# Patient Record
Sex: Female | Born: 1935
Health system: Southern US, Community
[De-identification: ages and names within clinical notes are randomized; demographics above are authoritative.]

## PROBLEM LIST (undated history)

## (undated) DIAGNOSIS — H409 Unspecified glaucoma: Secondary | ICD-10-CM

## (undated) DIAGNOSIS — E785 Hyperlipidemia, unspecified: Secondary | ICD-10-CM

## (undated) DIAGNOSIS — E119 Type 2 diabetes mellitus without complications: Secondary | ICD-10-CM

## (undated) DIAGNOSIS — H269 Unspecified cataract: Secondary | ICD-10-CM

## (undated) DIAGNOSIS — K219 Gastro-esophageal reflux disease without esophagitis: Secondary | ICD-10-CM

## (undated) DIAGNOSIS — M5116 Intervertebral disc disorders with radiculopathy, lumbar region: Secondary | ICD-10-CM

## (undated) DIAGNOSIS — M7541 Impingement syndrome of right shoulder: Secondary | ICD-10-CM

## (undated) DIAGNOSIS — I10 Essential (primary) hypertension: Secondary | ICD-10-CM

## (undated) DIAGNOSIS — T7840XA Allergy, unspecified, initial encounter: Secondary | ICD-10-CM

## (undated) DIAGNOSIS — I480 Paroxysmal atrial fibrillation: Secondary | ICD-10-CM

## (undated) DIAGNOSIS — S22080S Wedge compression fracture of T11-T12 vertebra, sequela: Secondary | ICD-10-CM

## (undated) DIAGNOSIS — K649 Unspecified hemorrhoids: Secondary | ICD-10-CM

## (undated) DIAGNOSIS — M75100 Unspecified rotator cuff tear or rupture of unspecified shoulder, not specified as traumatic: Secondary | ICD-10-CM

## (undated) DIAGNOSIS — R011 Cardiac murmur, unspecified: Secondary | ICD-10-CM

## (undated) DIAGNOSIS — H9313 Tinnitus, bilateral: Secondary | ICD-10-CM

## (undated) DIAGNOSIS — R809 Proteinuria, unspecified: Secondary | ICD-10-CM

## (undated) DIAGNOSIS — E039 Hypothyroidism, unspecified: Secondary | ICD-10-CM

## (undated) DIAGNOSIS — C7B00 Secondary carcinoid tumors, unspecified site: Secondary | ICD-10-CM

## (undated) DIAGNOSIS — G4700 Insomnia, unspecified: Secondary | ICD-10-CM

## (undated) DIAGNOSIS — E669 Obesity, unspecified: Secondary | ICD-10-CM

## (undated) DIAGNOSIS — R911 Solitary pulmonary nodule: Secondary | ICD-10-CM

## (undated) DIAGNOSIS — M810 Age-related osteoporosis without current pathological fracture: Secondary | ICD-10-CM

## (undated) DIAGNOSIS — D179 Benign lipomatous neoplasm, unspecified: Secondary | ICD-10-CM

## (undated) DIAGNOSIS — M21619 Bunion of unspecified foot: Secondary | ICD-10-CM

## (undated) DIAGNOSIS — G5600 Carpal tunnel syndrome, unspecified upper limb: Secondary | ICD-10-CM

## (undated) DIAGNOSIS — M62830 Muscle spasm of back: Secondary | ICD-10-CM

## (undated) DIAGNOSIS — M159 Polyosteoarthritis, unspecified: Secondary | ICD-10-CM

## (undated) DIAGNOSIS — G459 Transient cerebral ischemic attack, unspecified: Secondary | ICD-10-CM

## (undated) DIAGNOSIS — E559 Vitamin D deficiency, unspecified: Secondary | ICD-10-CM

## (undated) DIAGNOSIS — M109 Gout, unspecified: Secondary | ICD-10-CM

## (undated) HISTORY — DX: Solitary pulmonary nodule: R91.1

## (undated) HISTORY — PX: HEMORROIDECTOMY: SUR656

## (undated) HISTORY — DX: Allergy, unspecified, initial encounter: T78.40XA

## (undated) HISTORY — PX: FEMUR FRACTURE SURGERY: SHX633

## (undated) HISTORY — DX: Gout, unspecified: M10.9

## (undated) HISTORY — DX: Gastro-esophageal reflux disease without esophagitis: K21.9

## (undated) HISTORY — PX: CARPAL TUNNEL RELEASE: SHX101

## (undated) HISTORY — PX: MULTIPLE TOOTH EXTRACTIONS: SHX2053

## (undated) HISTORY — DX: Unspecified glaucoma: H40.9

## (undated) HISTORY — DX: Bunion of unspecified foot: M21.619

## (undated) HISTORY — DX: Hyperlipidemia, unspecified: E78.5

## (undated) HISTORY — PX: COLON SURGERY: SHX602

## (undated) HISTORY — DX: Intervertebral disc disorders with radiculopathy, lumbar region: M51.16

## (undated) HISTORY — DX: Unspecified rotator cuff tear or rupture of unspecified shoulder, not specified as traumatic: M75.100

## (undated) HISTORY — DX: Paroxysmal atrial fibrillation: I48.0

## (undated) HISTORY — DX: Cardiac murmur, unspecified: R01.1

## (undated) HISTORY — DX: Vitamin D deficiency, unspecified: E55.9

## (undated) HISTORY — PX: OTHER SURGICAL HISTORY: SHX169

## (undated) HISTORY — DX: Unspecified hemorrhoids: K64.9

## (undated) HISTORY — DX: Impingement syndrome of right shoulder: M75.41

## (undated) HISTORY — DX: Proteinuria, unspecified: R80.9

## (undated) HISTORY — DX: Unspecified cataract: H26.9

## (undated) HISTORY — DX: Age-related osteoporosis without current pathological fracture: M81.0

## (undated) HISTORY — DX: Insomnia, unspecified: G47.00

## (undated) HISTORY — DX: Benign lipomatous neoplasm, unspecified: D17.9

## (undated) HISTORY — DX: Polyosteoarthritis, unspecified: M15.9

## (undated) HISTORY — DX: Carpal tunnel syndrome, unspecified upper limb: G56.00

## (undated) HISTORY — DX: Obesity, unspecified: E66.9

## (undated) HISTORY — PX: APPENDECTOMY: SHX54

## (undated) HISTORY — DX: Type 2 diabetes mellitus without complications: E11.9

## (undated) HISTORY — DX: Wedge compression fracture of t11-T12 vertebra, sequela: S22.080S

## (undated) HISTORY — DX: Essential (primary) hypertension: I10

## (undated) HISTORY — DX: Tinnitus, bilateral: H93.13

---
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
## 1981-11-04 HISTORY — PX: BREAST SURGERY: SHX581

## 1984-11-04 HISTORY — PX: ABDOMINAL HYSTERECTOMY: SHX81

## 1996-11-04 DIAGNOSIS — G459 Transient cerebral ischemic attack, unspecified: Secondary | ICD-10-CM

## 1996-11-04 HISTORY — DX: Transient cerebral ischemic attack, unspecified: G45.9

## 1999-11-05 HISTORY — PX: FRACTURE SURGERY: SHX138

## 2004-10-24 ENCOUNTER — Ambulatory Visit: Payer: Self-pay

## 2005-06-12 ENCOUNTER — Ambulatory Visit: Payer: Self-pay

## 2006-05-08 ENCOUNTER — Inpatient Hospital Stay: Payer: Self-pay | Admitting: Internal Medicine

## 2006-05-08 ENCOUNTER — Other Ambulatory Visit: Payer: Self-pay

## 2006-06-16 ENCOUNTER — Ambulatory Visit: Payer: Self-pay | Admitting: Family Medicine

## 2006-07-31 ENCOUNTER — Ambulatory Visit: Payer: Self-pay

## 2006-08-06 ENCOUNTER — Ambulatory Visit: Payer: Self-pay

## 2006-08-20 ENCOUNTER — Ambulatory Visit: Payer: Self-pay | Admitting: General Surgery

## 2006-11-04 LAB — HM COLONOSCOPY

## 2007-06-05 ENCOUNTER — Ambulatory Visit: Payer: Self-pay | Admitting: Oncology

## 2007-06-29 ENCOUNTER — Ambulatory Visit: Payer: Self-pay | Admitting: Oncology

## 2007-07-06 ENCOUNTER — Ambulatory Visit: Payer: Self-pay | Admitting: Oncology

## 2007-07-13 ENCOUNTER — Ambulatory Visit: Payer: Self-pay | Admitting: Oncology

## 2007-08-05 ENCOUNTER — Ambulatory Visit: Payer: Self-pay | Admitting: Oncology

## 2007-08-06 ENCOUNTER — Ambulatory Visit: Payer: Self-pay | Admitting: Oncology

## 2007-08-26 ENCOUNTER — Ambulatory Visit: Payer: Self-pay | Admitting: Unknown Physician Specialty

## 2007-09-05 ENCOUNTER — Ambulatory Visit: Payer: Self-pay | Admitting: Oncology

## 2007-10-05 ENCOUNTER — Ambulatory Visit: Payer: Self-pay | Admitting: Oncology

## 2007-11-05 ENCOUNTER — Ambulatory Visit: Payer: Self-pay | Admitting: Oncology

## 2007-11-26 ENCOUNTER — Ambulatory Visit: Payer: Self-pay | Admitting: Family Medicine

## 2007-12-06 ENCOUNTER — Ambulatory Visit: Payer: Self-pay | Admitting: Oncology

## 2008-01-03 ENCOUNTER — Ambulatory Visit: Payer: Self-pay | Admitting: Oncology

## 2008-05-11 ENCOUNTER — Ambulatory Visit: Payer: Self-pay | Admitting: Family Medicine

## 2008-07-06 ENCOUNTER — Ambulatory Visit: Payer: Self-pay | Admitting: Family Medicine

## 2008-07-18 ENCOUNTER — Ambulatory Visit: Payer: Self-pay | Admitting: Family Medicine

## 2009-01-11 ENCOUNTER — Ambulatory Visit: Payer: Self-pay | Admitting: Family Medicine

## 2009-01-20 ENCOUNTER — Ambulatory Visit: Payer: Self-pay | Admitting: Internal Medicine

## 2010-01-23 ENCOUNTER — Ambulatory Visit: Payer: Self-pay | Admitting: Family Medicine

## 2011-03-13 ENCOUNTER — Ambulatory Visit: Payer: Self-pay | Admitting: Family Medicine

## 2011-03-13 LAB — HM MAMMOGRAPHY: HM MAMMO: NORMAL

## 2011-12-10 DIAGNOSIS — H4010X0 Unspecified open-angle glaucoma, stage unspecified: Secondary | ICD-10-CM | POA: Diagnosis not present

## 2012-01-01 DIAGNOSIS — I10 Essential (primary) hypertension: Secondary | ICD-10-CM | POA: Diagnosis not present

## 2012-01-01 DIAGNOSIS — E785 Hyperlipidemia, unspecified: Secondary | ICD-10-CM | POA: Diagnosis not present

## 2012-01-01 DIAGNOSIS — D649 Anemia, unspecified: Secondary | ICD-10-CM | POA: Diagnosis not present

## 2012-02-03 DIAGNOSIS — R0609 Other forms of dyspnea: Secondary | ICD-10-CM | POA: Diagnosis not present

## 2012-02-03 DIAGNOSIS — R0989 Other specified symptoms and signs involving the circulatory and respiratory systems: Secondary | ICD-10-CM | POA: Diagnosis not present

## 2012-02-03 DIAGNOSIS — I10 Essential (primary) hypertension: Secondary | ICD-10-CM | POA: Diagnosis not present

## 2012-03-13 DIAGNOSIS — E785 Hyperlipidemia, unspecified: Secondary | ICD-10-CM | POA: Diagnosis not present

## 2012-03-13 DIAGNOSIS — I10 Essential (primary) hypertension: Secondary | ICD-10-CM | POA: Diagnosis not present

## 2012-03-13 DIAGNOSIS — D509 Iron deficiency anemia, unspecified: Secondary | ICD-10-CM | POA: Diagnosis not present

## 2012-03-13 DIAGNOSIS — E1129 Type 2 diabetes mellitus with other diabetic kidney complication: Secondary | ICD-10-CM | POA: Diagnosis not present

## 2012-06-16 DIAGNOSIS — H4010X0 Unspecified open-angle glaucoma, stage unspecified: Secondary | ICD-10-CM | POA: Diagnosis not present

## 2012-07-14 ENCOUNTER — Ambulatory Visit: Payer: Self-pay | Admitting: Family Medicine

## 2012-07-14 DIAGNOSIS — E785 Hyperlipidemia, unspecified: Secondary | ICD-10-CM | POA: Diagnosis not present

## 2012-07-14 DIAGNOSIS — E1129 Type 2 diabetes mellitus with other diabetic kidney complication: Secondary | ICD-10-CM | POA: Diagnosis not present

## 2012-07-14 DIAGNOSIS — G4700 Insomnia, unspecified: Secondary | ICD-10-CM | POA: Diagnosis not present

## 2012-07-14 DIAGNOSIS — I10 Essential (primary) hypertension: Secondary | ICD-10-CM | POA: Diagnosis not present

## 2012-07-14 DIAGNOSIS — M549 Dorsalgia, unspecified: Secondary | ICD-10-CM | POA: Diagnosis not present

## 2012-07-14 DIAGNOSIS — D509 Iron deficiency anemia, unspecified: Secondary | ICD-10-CM | POA: Diagnosis not present

## 2012-07-14 DIAGNOSIS — Z23 Encounter for immunization: Secondary | ICD-10-CM | POA: Diagnosis not present

## 2012-07-23 DIAGNOSIS — M1710 Unilateral primary osteoarthritis, unspecified knee: Secondary | ICD-10-CM | POA: Diagnosis not present

## 2012-07-23 DIAGNOSIS — M67919 Unspecified disorder of synovium and tendon, unspecified shoulder: Secondary | ICD-10-CM | POA: Diagnosis not present

## 2012-07-23 DIAGNOSIS — M719 Bursopathy, unspecified: Secondary | ICD-10-CM | POA: Diagnosis not present

## 2012-08-10 DIAGNOSIS — I10 Essential (primary) hypertension: Secondary | ICD-10-CM | POA: Diagnosis not present

## 2012-08-10 DIAGNOSIS — R0609 Other forms of dyspnea: Secondary | ICD-10-CM | POA: Diagnosis not present

## 2012-11-26 DIAGNOSIS — E785 Hyperlipidemia, unspecified: Secondary | ICD-10-CM | POA: Diagnosis not present

## 2012-11-26 DIAGNOSIS — Z23 Encounter for immunization: Secondary | ICD-10-CM | POA: Diagnosis not present

## 2012-11-26 DIAGNOSIS — E1129 Type 2 diabetes mellitus with other diabetic kidney complication: Secondary | ICD-10-CM | POA: Diagnosis not present

## 2012-11-26 DIAGNOSIS — M549 Dorsalgia, unspecified: Secondary | ICD-10-CM | POA: Diagnosis not present

## 2012-11-26 DIAGNOSIS — I10 Essential (primary) hypertension: Secondary | ICD-10-CM | POA: Diagnosis not present

## 2012-12-22 DIAGNOSIS — H4010X0 Unspecified open-angle glaucoma, stage unspecified: Secondary | ICD-10-CM | POA: Diagnosis not present

## 2013-02-08 DIAGNOSIS — I200 Unstable angina: Secondary | ICD-10-CM | POA: Diagnosis not present

## 2013-02-08 DIAGNOSIS — I059 Rheumatic mitral valve disease, unspecified: Secondary | ICD-10-CM | POA: Diagnosis not present

## 2013-02-08 DIAGNOSIS — R0609 Other forms of dyspnea: Secondary | ICD-10-CM | POA: Diagnosis not present

## 2013-03-04 DIAGNOSIS — R0609 Other forms of dyspnea: Secondary | ICD-10-CM | POA: Diagnosis not present

## 2013-03-04 DIAGNOSIS — I209 Angina pectoris, unspecified: Secondary | ICD-10-CM | POA: Diagnosis not present

## 2013-03-04 DIAGNOSIS — R0989 Other specified symptoms and signs involving the circulatory and respiratory systems: Secondary | ICD-10-CM | POA: Diagnosis not present

## 2013-05-24 DIAGNOSIS — G4700 Insomnia, unspecified: Secondary | ICD-10-CM | POA: Diagnosis not present

## 2013-05-24 DIAGNOSIS — E1129 Type 2 diabetes mellitus with other diabetic kidney complication: Secondary | ICD-10-CM | POA: Diagnosis not present

## 2013-05-24 DIAGNOSIS — M542 Cervicalgia: Secondary | ICD-10-CM | POA: Diagnosis not present

## 2013-05-24 DIAGNOSIS — I10 Essential (primary) hypertension: Secondary | ICD-10-CM | POA: Diagnosis not present

## 2013-06-03 ENCOUNTER — Ambulatory Visit: Payer: Self-pay | Admitting: Family Medicine

## 2013-06-03 DIAGNOSIS — M542 Cervicalgia: Secondary | ICD-10-CM | POA: Diagnosis not present

## 2013-06-03 DIAGNOSIS — IMO0002 Reserved for concepts with insufficient information to code with codable children: Secondary | ICD-10-CM | POA: Diagnosis not present

## 2013-06-09 DIAGNOSIS — H4010X0 Unspecified open-angle glaucoma, stage unspecified: Secondary | ICD-10-CM | POA: Diagnosis not present

## 2013-06-28 DIAGNOSIS — M6281 Muscle weakness (generalized): Secondary | ICD-10-CM | POA: Diagnosis not present

## 2013-06-28 DIAGNOSIS — M5030 Other cervical disc degeneration, unspecified cervical region: Secondary | ICD-10-CM | POA: Diagnosis not present

## 2013-07-22 DIAGNOSIS — E785 Hyperlipidemia, unspecified: Secondary | ICD-10-CM | POA: Diagnosis not present

## 2013-07-22 DIAGNOSIS — I10 Essential (primary) hypertension: Secondary | ICD-10-CM | POA: Diagnosis not present

## 2013-07-22 DIAGNOSIS — D509 Iron deficiency anemia, unspecified: Secondary | ICD-10-CM | POA: Diagnosis not present

## 2013-08-09 DIAGNOSIS — I10 Essential (primary) hypertension: Secondary | ICD-10-CM | POA: Diagnosis not present

## 2013-08-09 DIAGNOSIS — E783 Hyperchylomicronemia: Secondary | ICD-10-CM | POA: Diagnosis not present

## 2013-08-12 DIAGNOSIS — M62838 Other muscle spasm: Secondary | ICD-10-CM | POA: Diagnosis not present

## 2013-08-12 DIAGNOSIS — IMO0002 Reserved for concepts with insufficient information to code with codable children: Secondary | ICD-10-CM | POA: Diagnosis not present

## 2013-09-07 DIAGNOSIS — IMO0002 Reserved for concepts with insufficient information to code with codable children: Secondary | ICD-10-CM | POA: Diagnosis not present

## 2013-09-07 DIAGNOSIS — M1710 Unilateral primary osteoarthritis, unspecified knee: Secondary | ICD-10-CM | POA: Diagnosis not present

## 2013-09-07 DIAGNOSIS — M47817 Spondylosis without myelopathy or radiculopathy, lumbosacral region: Secondary | ICD-10-CM | POA: Diagnosis not present

## 2013-09-07 DIAGNOSIS — M542 Cervicalgia: Secondary | ICD-10-CM | POA: Diagnosis not present

## 2013-09-07 DIAGNOSIS — M25569 Pain in unspecified knee: Secondary | ICD-10-CM | POA: Diagnosis not present

## 2013-09-07 DIAGNOSIS — M5030 Other cervical disc degeneration, unspecified cervical region: Secondary | ICD-10-CM | POA: Diagnosis not present

## 2013-09-16 ENCOUNTER — Ambulatory Visit: Payer: Self-pay | Admitting: Physical Medicine and Rehabilitation

## 2013-09-16 DIAGNOSIS — M5126 Other intervertebral disc displacement, lumbar region: Secondary | ICD-10-CM | POA: Diagnosis not present

## 2013-09-16 DIAGNOSIS — M549 Dorsalgia, unspecified: Secondary | ICD-10-CM | POA: Diagnosis not present

## 2013-09-16 DIAGNOSIS — M79609 Pain in unspecified limb: Secondary | ICD-10-CM | POA: Diagnosis not present

## 2013-09-16 DIAGNOSIS — M5137 Other intervertebral disc degeneration, lumbosacral region: Secondary | ICD-10-CM | POA: Diagnosis not present

## 2013-09-16 DIAGNOSIS — M47817 Spondylosis without myelopathy or radiculopathy, lumbosacral region: Secondary | ICD-10-CM | POA: Diagnosis not present

## 2013-09-23 DIAGNOSIS — M25569 Pain in unspecified knee: Secondary | ICD-10-CM | POA: Diagnosis not present

## 2013-09-23 DIAGNOSIS — M542 Cervicalgia: Secondary | ICD-10-CM | POA: Diagnosis not present

## 2013-09-23 DIAGNOSIS — M1710 Unilateral primary osteoarthritis, unspecified knee: Secondary | ICD-10-CM | POA: Diagnosis not present

## 2013-09-23 DIAGNOSIS — IMO0002 Reserved for concepts with insufficient information to code with codable children: Secondary | ICD-10-CM | POA: Diagnosis not present

## 2013-09-23 DIAGNOSIS — M47817 Spondylosis without myelopathy or radiculopathy, lumbosacral region: Secondary | ICD-10-CM | POA: Diagnosis not present

## 2013-09-23 DIAGNOSIS — M5030 Other cervical disc degeneration, unspecified cervical region: Secondary | ICD-10-CM | POA: Diagnosis not present

## 2013-09-24 DIAGNOSIS — D509 Iron deficiency anemia, unspecified: Secondary | ICD-10-CM | POA: Diagnosis not present

## 2013-09-24 DIAGNOSIS — Z23 Encounter for immunization: Secondary | ICD-10-CM | POA: Diagnosis not present

## 2013-09-24 DIAGNOSIS — I10 Essential (primary) hypertension: Secondary | ICD-10-CM | POA: Diagnosis not present

## 2013-09-24 DIAGNOSIS — E1129 Type 2 diabetes mellitus with other diabetic kidney complication: Secondary | ICD-10-CM | POA: Diagnosis not present

## 2013-12-08 DIAGNOSIS — H4010X0 Unspecified open-angle glaucoma, stage unspecified: Secondary | ICD-10-CM | POA: Diagnosis not present

## 2014-01-24 DIAGNOSIS — E785 Hyperlipidemia, unspecified: Secondary | ICD-10-CM | POA: Diagnosis not present

## 2014-01-24 DIAGNOSIS — J069 Acute upper respiratory infection, unspecified: Secondary | ICD-10-CM | POA: Diagnosis not present

## 2014-01-24 DIAGNOSIS — I10 Essential (primary) hypertension: Secondary | ICD-10-CM | POA: Diagnosis not present

## 2014-01-24 DIAGNOSIS — E1129 Type 2 diabetes mellitus with other diabetic kidney complication: Secondary | ICD-10-CM | POA: Diagnosis not present

## 2014-03-28 ENCOUNTER — Emergency Department: Payer: Self-pay | Admitting: Emergency Medicine

## 2014-03-28 DIAGNOSIS — I10 Essential (primary) hypertension: Secondary | ICD-10-CM | POA: Diagnosis not present

## 2014-03-28 DIAGNOSIS — R42 Dizziness and giddiness: Secondary | ICD-10-CM | POA: Diagnosis not present

## 2014-03-28 DIAGNOSIS — E119 Type 2 diabetes mellitus without complications: Secondary | ICD-10-CM | POA: Diagnosis not present

## 2014-03-28 DIAGNOSIS — S298XXA Other specified injuries of thorax, initial encounter: Secondary | ICD-10-CM | POA: Diagnosis not present

## 2014-03-28 LAB — CBC WITH DIFFERENTIAL/PLATELET
BASOS ABS: 0.1 10*3/uL (ref 0.0–0.1)
Basophil %: 1 %
Eosinophil #: 0.3 10*3/uL (ref 0.0–0.7)
Eosinophil %: 2.2 %
HCT: 36.7 % (ref 35.0–47.0)
HGB: 11.3 g/dL — ABNORMAL LOW (ref 12.0–16.0)
LYMPHS ABS: 4.3 10*3/uL — AB (ref 1.0–3.6)
Lymphocyte %: 32.1 %
MCH: 24.8 pg — AB (ref 26.0–34.0)
MCHC: 30.7 g/dL — ABNORMAL LOW (ref 32.0–36.0)
MCV: 81 fL (ref 80–100)
MONOS PCT: 7.1 %
Monocyte #: 0.9 x10 3/mm (ref 0.2–0.9)
Neutrophil #: 7.7 10*3/uL — ABNORMAL HIGH (ref 1.4–6.5)
Neutrophil %: 57.6 %
Platelet: 343 10*3/uL (ref 150–440)
RBC: 4.55 10*6/uL (ref 3.80–5.20)
RDW: 17 % — ABNORMAL HIGH (ref 11.5–14.5)
WBC: 13.4 10*3/uL — ABNORMAL HIGH (ref 3.6–11.0)

## 2014-03-28 LAB — BASIC METABOLIC PANEL
Anion Gap: 8 (ref 7–16)
BUN: 13 mg/dL (ref 7–18)
CALCIUM: 8.9 mg/dL (ref 8.5–10.1)
CO2: 26 mmol/L (ref 21–32)
CREATININE: 1.26 mg/dL (ref 0.60–1.30)
Chloride: 102 mmol/L (ref 98–107)
EGFR (African American): 47 — ABNORMAL LOW
GFR CALC NON AF AMER: 40 — AB
GLUCOSE: 232 mg/dL — AB (ref 65–99)
Osmolality: 279 (ref 275–301)
Potassium: 3.6 mmol/L (ref 3.5–5.1)
Sodium: 136 mmol/L (ref 136–145)

## 2014-03-28 LAB — URINALYSIS, COMPLETE
BLOOD: NEGATIVE
Bacteria: NONE SEEN
Bilirubin,UR: NEGATIVE
Glucose,UR: NEGATIVE mg/dL (ref 0–75)
Hyaline Cast: 5
KETONE: NEGATIVE
Leukocyte Esterase: NEGATIVE
NITRITE: NEGATIVE
Ph: 5 (ref 4.5–8.0)
Protein: NEGATIVE
RBC, UR: NONE SEEN /HPF (ref 0–5)
SPECIFIC GRAVITY: 1.019 (ref 1.003–1.030)
Squamous Epithelial: 8
WBC UR: NONE SEEN /HPF (ref 0–5)

## 2014-03-28 LAB — TROPONIN I

## 2014-04-05 DIAGNOSIS — J309 Allergic rhinitis, unspecified: Secondary | ICD-10-CM | POA: Diagnosis not present

## 2014-04-05 DIAGNOSIS — R42 Dizziness and giddiness: Secondary | ICD-10-CM | POA: Diagnosis not present

## 2014-04-05 DIAGNOSIS — J189 Pneumonia, unspecified organism: Secondary | ICD-10-CM | POA: Diagnosis not present

## 2014-04-18 DIAGNOSIS — R42 Dizziness and giddiness: Secondary | ICD-10-CM | POA: Diagnosis not present

## 2014-05-02 DIAGNOSIS — E785 Hyperlipidemia, unspecified: Secondary | ICD-10-CM | POA: Diagnosis not present

## 2014-05-02 DIAGNOSIS — E1129 Type 2 diabetes mellitus with other diabetic kidney complication: Secondary | ICD-10-CM | POA: Diagnosis not present

## 2014-05-02 DIAGNOSIS — R42 Dizziness and giddiness: Secondary | ICD-10-CM | POA: Diagnosis not present

## 2014-05-02 DIAGNOSIS — D72829 Elevated white blood cell count, unspecified: Secondary | ICD-10-CM | POA: Diagnosis not present

## 2014-05-02 DIAGNOSIS — N189 Chronic kidney disease, unspecified: Secondary | ICD-10-CM | POA: Diagnosis not present

## 2014-05-05 DIAGNOSIS — R0982 Postnasal drip: Secondary | ICD-10-CM | POA: Diagnosis not present

## 2014-05-05 DIAGNOSIS — H814 Vertigo of central origin: Secondary | ICD-10-CM | POA: Diagnosis not present

## 2014-05-05 DIAGNOSIS — R42 Dizziness and giddiness: Secondary | ICD-10-CM | POA: Diagnosis not present

## 2014-05-05 DIAGNOSIS — R05 Cough: Secondary | ICD-10-CM | POA: Diagnosis not present

## 2014-05-05 DIAGNOSIS — R059 Cough, unspecified: Secondary | ICD-10-CM | POA: Diagnosis not present

## 2014-05-11 DIAGNOSIS — R05 Cough: Secondary | ICD-10-CM | POA: Diagnosis not present

## 2014-05-11 DIAGNOSIS — R059 Cough, unspecified: Secondary | ICD-10-CM | POA: Diagnosis not present

## 2014-05-11 DIAGNOSIS — R053 Chronic cough: Secondary | ICD-10-CM | POA: Insufficient documentation

## 2014-05-11 DIAGNOSIS — D72829 Elevated white blood cell count, unspecified: Secondary | ICD-10-CM | POA: Diagnosis not present

## 2014-05-12 ENCOUNTER — Ambulatory Visit: Payer: Self-pay

## 2014-05-12 DIAGNOSIS — R911 Solitary pulmonary nodule: Secondary | ICD-10-CM | POA: Insufficient documentation

## 2014-05-12 DIAGNOSIS — R05 Cough: Secondary | ICD-10-CM | POA: Diagnosis not present

## 2014-05-12 DIAGNOSIS — R059 Cough, unspecified: Secondary | ICD-10-CM | POA: Diagnosis not present

## 2014-05-12 DIAGNOSIS — J984 Other disorders of lung: Secondary | ICD-10-CM | POA: Diagnosis not present

## 2014-05-12 DIAGNOSIS — D72829 Elevated white blood cell count, unspecified: Secondary | ICD-10-CM | POA: Diagnosis not present

## 2014-05-16 DIAGNOSIS — R42 Dizziness and giddiness: Secondary | ICD-10-CM | POA: Diagnosis not present

## 2014-05-17 DIAGNOSIS — R0989 Other specified symptoms and signs involving the circulatory and respiratory systems: Secondary | ICD-10-CM | POA: Diagnosis not present

## 2014-05-26 DIAGNOSIS — R42 Dizziness and giddiness: Secondary | ICD-10-CM | POA: Diagnosis not present

## 2014-06-07 DIAGNOSIS — H4010X0 Unspecified open-angle glaucoma, stage unspecified: Secondary | ICD-10-CM | POA: Diagnosis not present

## 2014-06-16 DIAGNOSIS — R809 Proteinuria, unspecified: Secondary | ICD-10-CM | POA: Diagnosis not present

## 2014-06-16 DIAGNOSIS — D72829 Elevated white blood cell count, unspecified: Secondary | ICD-10-CM | POA: Diagnosis not present

## 2014-06-16 DIAGNOSIS — R05 Cough: Secondary | ICD-10-CM | POA: Diagnosis not present

## 2014-06-16 DIAGNOSIS — E785 Hyperlipidemia, unspecified: Secondary | ICD-10-CM | POA: Diagnosis not present

## 2014-06-16 DIAGNOSIS — E042 Nontoxic multinodular goiter: Secondary | ICD-10-CM | POA: Diagnosis not present

## 2014-06-16 DIAGNOSIS — E1129 Type 2 diabetes mellitus with other diabetic kidney complication: Secondary | ICD-10-CM | POA: Diagnosis not present

## 2014-06-16 DIAGNOSIS — M810 Age-related osteoporosis without current pathological fracture: Secondary | ICD-10-CM | POA: Diagnosis not present

## 2014-06-16 DIAGNOSIS — R059 Cough, unspecified: Secondary | ICD-10-CM | POA: Diagnosis not present

## 2014-06-16 DIAGNOSIS — I10 Essential (primary) hypertension: Secondary | ICD-10-CM | POA: Diagnosis not present

## 2014-06-16 DIAGNOSIS — N189 Chronic kidney disease, unspecified: Secondary | ICD-10-CM | POA: Diagnosis not present

## 2014-06-16 LAB — LIPID PANEL
CHOLESTEROL: 159 mg/dL (ref 0–200)
HDL: 61 mg/dL (ref 35–70)
LDL Cholesterol: 72 mg/dL
Triglycerides: 131 mg/dL (ref 40–160)

## 2014-06-20 ENCOUNTER — Ambulatory Visit: Payer: Self-pay | Admitting: Family Medicine

## 2014-06-20 DIAGNOSIS — E042 Nontoxic multinodular goiter: Secondary | ICD-10-CM | POA: Diagnosis not present

## 2014-06-20 DIAGNOSIS — E041 Nontoxic single thyroid nodule: Secondary | ICD-10-CM | POA: Diagnosis not present

## 2014-07-01 DIAGNOSIS — E041 Nontoxic single thyroid nodule: Secondary | ICD-10-CM | POA: Diagnosis not present

## 2014-07-01 DIAGNOSIS — R059 Cough, unspecified: Secondary | ICD-10-CM | POA: Diagnosis not present

## 2014-07-01 DIAGNOSIS — R05 Cough: Secondary | ICD-10-CM | POA: Diagnosis not present

## 2014-07-01 DIAGNOSIS — R0982 Postnasal drip: Secondary | ICD-10-CM | POA: Diagnosis not present

## 2014-07-01 DIAGNOSIS — D72829 Elevated white blood cell count, unspecified: Secondary | ICD-10-CM | POA: Diagnosis not present

## 2014-07-07 DIAGNOSIS — J309 Allergic rhinitis, unspecified: Secondary | ICD-10-CM | POA: Diagnosis not present

## 2014-07-07 DIAGNOSIS — J381 Polyp of vocal cord and larynx: Secondary | ICD-10-CM | POA: Diagnosis not present

## 2014-07-07 DIAGNOSIS — R059 Cough, unspecified: Secondary | ICD-10-CM | POA: Diagnosis not present

## 2014-07-07 DIAGNOSIS — R05 Cough: Secondary | ICD-10-CM | POA: Diagnosis not present

## 2014-07-07 DIAGNOSIS — R911 Solitary pulmonary nodule: Secondary | ICD-10-CM | POA: Diagnosis not present

## 2014-07-28 DIAGNOSIS — E042 Nontoxic multinodular goiter: Secondary | ICD-10-CM | POA: Diagnosis not present

## 2014-07-28 DIAGNOSIS — R911 Solitary pulmonary nodule: Secondary | ICD-10-CM | POA: Diagnosis not present

## 2014-07-28 DIAGNOSIS — R059 Cough, unspecified: Secondary | ICD-10-CM | POA: Diagnosis not present

## 2014-07-28 DIAGNOSIS — R05 Cough: Secondary | ICD-10-CM | POA: Diagnosis not present

## 2014-07-28 DIAGNOSIS — R0602 Shortness of breath: Secondary | ICD-10-CM | POA: Diagnosis not present

## 2014-08-05 DIAGNOSIS — Z23 Encounter for immunization: Secondary | ICD-10-CM | POA: Diagnosis not present

## 2014-12-02 DIAGNOSIS — R05 Cough: Secondary | ICD-10-CM | POA: Diagnosis not present

## 2014-12-02 DIAGNOSIS — E042 Nontoxic multinodular goiter: Secondary | ICD-10-CM | POA: Diagnosis not present

## 2014-12-02 DIAGNOSIS — I129 Hypertensive chronic kidney disease with stage 1 through stage 4 chronic kidney disease, or unspecified chronic kidney disease: Secondary | ICD-10-CM | POA: Diagnosis not present

## 2014-12-02 DIAGNOSIS — R42 Dizziness and giddiness: Secondary | ICD-10-CM | POA: Diagnosis not present

## 2014-12-02 DIAGNOSIS — N189 Chronic kidney disease, unspecified: Secondary | ICD-10-CM | POA: Diagnosis not present

## 2014-12-02 DIAGNOSIS — I10 Essential (primary) hypertension: Secondary | ICD-10-CM | POA: Diagnosis not present

## 2014-12-02 DIAGNOSIS — H9313 Tinnitus, bilateral: Secondary | ICD-10-CM | POA: Diagnosis not present

## 2014-12-02 DIAGNOSIS — E1122 Type 2 diabetes mellitus with diabetic chronic kidney disease: Secondary | ICD-10-CM | POA: Diagnosis not present

## 2014-12-02 DIAGNOSIS — H9193 Unspecified hearing loss, bilateral: Secondary | ICD-10-CM | POA: Diagnosis not present

## 2014-12-02 DIAGNOSIS — F5104 Psychophysiologic insomnia: Secondary | ICD-10-CM | POA: Diagnosis not present

## 2014-12-02 DIAGNOSIS — E785 Hyperlipidemia, unspecified: Secondary | ICD-10-CM | POA: Diagnosis not present

## 2014-12-16 DIAGNOSIS — I10 Essential (primary) hypertension: Secondary | ICD-10-CM | POA: Diagnosis not present

## 2014-12-16 DIAGNOSIS — R42 Dizziness and giddiness: Secondary | ICD-10-CM | POA: Diagnosis not present

## 2014-12-16 DIAGNOSIS — E1122 Type 2 diabetes mellitus with diabetic chronic kidney disease: Secondary | ICD-10-CM | POA: Diagnosis not present

## 2014-12-16 DIAGNOSIS — N189 Chronic kidney disease, unspecified: Secondary | ICD-10-CM | POA: Diagnosis not present

## 2014-12-16 DIAGNOSIS — I129 Hypertensive chronic kidney disease with stage 1 through stage 4 chronic kidney disease, or unspecified chronic kidney disease: Secondary | ICD-10-CM | POA: Diagnosis not present

## 2015-01-16 ENCOUNTER — Ambulatory Visit: Payer: Self-pay | Admitting: Family Medicine

## 2015-01-16 DIAGNOSIS — I129 Hypertensive chronic kidney disease with stage 1 through stage 4 chronic kidney disease, or unspecified chronic kidney disease: Secondary | ICD-10-CM | POA: Diagnosis not present

## 2015-01-16 DIAGNOSIS — N189 Chronic kidney disease, unspecified: Secondary | ICD-10-CM | POA: Diagnosis not present

## 2015-01-16 DIAGNOSIS — M5412 Radiculopathy, cervical region: Secondary | ICD-10-CM | POA: Diagnosis not present

## 2015-01-16 DIAGNOSIS — I10 Essential (primary) hypertension: Secondary | ICD-10-CM | POA: Diagnosis not present

## 2015-01-16 DIAGNOSIS — R42 Dizziness and giddiness: Secondary | ICD-10-CM | POA: Diagnosis not present

## 2015-01-16 DIAGNOSIS — M5032 Other cervical disc degeneration, mid-cervical region: Secondary | ICD-10-CM | POA: Diagnosis not present

## 2015-01-16 DIAGNOSIS — E1122 Type 2 diabetes mellitus with diabetic chronic kidney disease: Secondary | ICD-10-CM | POA: Diagnosis not present

## 2015-01-23 DIAGNOSIS — H538 Other visual disturbances: Secondary | ICD-10-CM | POA: Diagnosis not present

## 2015-01-23 DIAGNOSIS — H8111 Benign paroxysmal vertigo, right ear: Secondary | ICD-10-CM | POA: Diagnosis not present

## 2015-01-23 DIAGNOSIS — R42 Dizziness and giddiness: Secondary | ICD-10-CM | POA: Diagnosis not present

## 2015-01-23 DIAGNOSIS — Z87898 Personal history of other specified conditions: Secondary | ICD-10-CM | POA: Diagnosis not present

## 2015-01-25 ENCOUNTER — Ambulatory Visit: Payer: Self-pay | Admitting: Family Medicine

## 2015-01-25 DIAGNOSIS — M5136 Other intervertebral disc degeneration, lumbar region: Secondary | ICD-10-CM | POA: Diagnosis not present

## 2015-01-25 DIAGNOSIS — M4806 Spinal stenosis, lumbar region: Secondary | ICD-10-CM | POA: Diagnosis not present

## 2015-01-25 DIAGNOSIS — H4011X0 Primary open-angle glaucoma, stage unspecified: Secondary | ICD-10-CM | POA: Diagnosis not present

## 2015-01-25 DIAGNOSIS — M4802 Spinal stenosis, cervical region: Secondary | ICD-10-CM | POA: Diagnosis not present

## 2015-01-25 DIAGNOSIS — M5386 Other specified dorsopathies, lumbar region: Secondary | ICD-10-CM | POA: Diagnosis not present

## 2015-01-25 DIAGNOSIS — M47896 Other spondylosis, lumbar region: Secondary | ICD-10-CM | POA: Diagnosis not present

## 2015-01-26 DIAGNOSIS — R911 Solitary pulmonary nodule: Secondary | ICD-10-CM | POA: Diagnosis not present

## 2015-01-26 DIAGNOSIS — R05 Cough: Secondary | ICD-10-CM | POA: Diagnosis not present

## 2015-01-26 DIAGNOSIS — J310 Chronic rhinitis: Secondary | ICD-10-CM | POA: Diagnosis not present

## 2015-01-30 ENCOUNTER — Ambulatory Visit: Payer: Self-pay | Admitting: Neurology

## 2015-01-30 DIAGNOSIS — R42 Dizziness and giddiness: Secondary | ICD-10-CM | POA: Diagnosis not present

## 2015-01-30 DIAGNOSIS — H538 Other visual disturbances: Secondary | ICD-10-CM | POA: Diagnosis not present

## 2015-01-30 DIAGNOSIS — R930 Abnormal findings on diagnostic imaging of skull and head, not elsewhere classified: Secondary | ICD-10-CM | POA: Diagnosis not present

## 2015-01-30 DIAGNOSIS — H0520 Unspecified exophthalmos: Secondary | ICD-10-CM | POA: Diagnosis not present

## 2015-01-30 DIAGNOSIS — R51 Headache: Secondary | ICD-10-CM | POA: Diagnosis not present

## 2015-02-06 DIAGNOSIS — M5030 Other cervical disc degeneration, unspecified cervical region: Secondary | ICD-10-CM | POA: Diagnosis not present

## 2015-02-06 DIAGNOSIS — M5412 Radiculopathy, cervical region: Secondary | ICD-10-CM | POA: Diagnosis not present

## 2015-02-17 ENCOUNTER — Other Ambulatory Visit: Payer: Self-pay | Admitting: Specialist

## 2015-02-17 DIAGNOSIS — R911 Solitary pulmonary nodule: Secondary | ICD-10-CM

## 2015-03-13 DIAGNOSIS — H8111 Benign paroxysmal vertigo, right ear: Secondary | ICD-10-CM | POA: Diagnosis not present

## 2015-03-13 DIAGNOSIS — E042 Nontoxic multinodular goiter: Secondary | ICD-10-CM | POA: Diagnosis not present

## 2015-03-13 DIAGNOSIS — R42 Dizziness and giddiness: Secondary | ICD-10-CM | POA: Diagnosis not present

## 2015-03-13 DIAGNOSIS — Z87898 Personal history of other specified conditions: Secondary | ICD-10-CM | POA: Diagnosis not present

## 2015-03-14 ENCOUNTER — Other Ambulatory Visit: Payer: Self-pay | Admitting: Internal Medicine

## 2015-03-14 DIAGNOSIS — E041 Nontoxic single thyroid nodule: Secondary | ICD-10-CM

## 2015-03-20 ENCOUNTER — Ambulatory Visit
Admission: RE | Admit: 2015-03-20 | Discharge: 2015-03-20 | Disposition: A | Discharge status: Home / Self Care | Payer: Medicare Other | Source: Ambulatory Visit | Attending: Internal Medicine | Admitting: Internal Medicine

## 2015-03-20 DIAGNOSIS — E041 Nontoxic single thyroid nodule: Secondary | ICD-10-CM

## 2015-03-21 ENCOUNTER — Other Ambulatory Visit: Payer: Self-pay

## 2015-03-21 ENCOUNTER — Ambulatory Visit
Admission: RE | Admit: 2015-03-21 | Discharge: 2015-03-21 | Disposition: A | Discharge status: Home / Self Care | Payer: Medicare Other | Source: Ambulatory Visit | Attending: Internal Medicine | Admitting: Internal Medicine

## 2015-03-22 ENCOUNTER — Ambulatory Visit
Admission: RE | Admit: 2015-03-22 | Discharge: 2015-03-22 | Disposition: A | Discharge status: Home / Self Care | Payer: Medicare Other | Source: Ambulatory Visit | Attending: Internal Medicine | Admitting: Internal Medicine

## 2015-03-22 DIAGNOSIS — E0590 Thyrotoxicosis, unspecified without thyrotoxic crisis or storm: Secondary | ICD-10-CM | POA: Diagnosis not present

## 2015-03-22 DIAGNOSIS — E041 Nontoxic single thyroid nodule: Secondary | ICD-10-CM | POA: Diagnosis not present

## 2015-03-22 MED ORDER — SODIUM IODIDE I-123 7.4 MBQ PO CAPS
200.0000 | ORAL_CAPSULE | Freq: Once | ORAL | Status: AC
  Administered 2015-03-21: 158.7 via ORAL

## 2015-03-29 ENCOUNTER — Other Ambulatory Visit: Payer: Self-pay | Admitting: Internal Medicine

## 2015-03-29 DIAGNOSIS — E0520 Thyrotoxicosis with toxic multinodular goiter without thyrotoxic crisis or storm: Secondary | ICD-10-CM | POA: Diagnosis not present

## 2015-03-29 DIAGNOSIS — E0590 Thyrotoxicosis, unspecified without thyrotoxic crisis or storm: Secondary | ICD-10-CM

## 2015-04-04 DIAGNOSIS — E785 Hyperlipidemia, unspecified: Secondary | ICD-10-CM | POA: Diagnosis not present

## 2015-04-04 DIAGNOSIS — I129 Hypertensive chronic kidney disease with stage 1 through stage 4 chronic kidney disease, or unspecified chronic kidney disease: Secondary | ICD-10-CM | POA: Diagnosis not present

## 2015-04-04 DIAGNOSIS — R809 Proteinuria, unspecified: Secondary | ICD-10-CM | POA: Diagnosis not present

## 2015-04-04 DIAGNOSIS — E669 Obesity, unspecified: Secondary | ICD-10-CM | POA: Diagnosis not present

## 2015-04-04 DIAGNOSIS — R911 Solitary pulmonary nodule: Secondary | ICD-10-CM | POA: Diagnosis not present

## 2015-04-04 DIAGNOSIS — M159 Polyosteoarthritis, unspecified: Secondary | ICD-10-CM | POA: Diagnosis not present

## 2015-04-04 DIAGNOSIS — Z713 Dietary counseling and surveillance: Secondary | ICD-10-CM | POA: Diagnosis not present

## 2015-04-04 DIAGNOSIS — N189 Chronic kidney disease, unspecified: Secondary | ICD-10-CM | POA: Diagnosis not present

## 2015-04-04 DIAGNOSIS — Z23 Encounter for immunization: Secondary | ICD-10-CM | POA: Diagnosis not present

## 2015-04-04 DIAGNOSIS — I10 Essential (primary) hypertension: Secondary | ICD-10-CM | POA: Diagnosis not present

## 2015-04-04 DIAGNOSIS — F5104 Psychophysiologic insomnia: Secondary | ICD-10-CM | POA: Diagnosis not present

## 2015-04-04 DIAGNOSIS — E1122 Type 2 diabetes mellitus with diabetic chronic kidney disease: Secondary | ICD-10-CM | POA: Diagnosis not present

## 2015-04-04 LAB — HEMOGLOBIN A1C: Hgb A1c MFr Bld: 8.3 % — AB (ref 4.0–6.0)

## 2015-04-17 ENCOUNTER — Ambulatory Visit
Admission: RE | Admit: 2015-04-17 | Discharge: 2015-04-17 | Disposition: A | Discharge status: Home / Self Care | Payer: Medicare Other | Source: Ambulatory Visit | Attending: Internal Medicine | Admitting: Internal Medicine

## 2015-04-17 DIAGNOSIS — E0590 Thyrotoxicosis, unspecified without thyrotoxic crisis or storm: Secondary | ICD-10-CM

## 2015-04-17 MED ORDER — SODIUM IODIDE I 131 CAPSULE
37.5800 | Freq: Once | INTRAVENOUS | Status: AC | PRN
  Administered 2015-04-17: 37.58 via ORAL

## 2015-05-19 ENCOUNTER — Other Ambulatory Visit: Payer: Self-pay | Admitting: Family Medicine

## 2015-05-19 NOTE — Telephone Encounter (Signed)
Patient requesting refill. 

## 2015-05-24 ENCOUNTER — Other Ambulatory Visit: Payer: Self-pay | Admitting: Family Medicine

## 2015-06-28 ENCOUNTER — Ambulatory Visit
Admission: RE | Admit: 2015-06-28 | Discharge: 2015-06-28 | Disposition: A | Discharge status: Home / Self Care | Payer: Medicare Other | Source: Ambulatory Visit | Attending: Specialist | Admitting: Specialist

## 2015-06-28 DIAGNOSIS — R911 Solitary pulmonary nodule: Secondary | ICD-10-CM | POA: Diagnosis present

## 2015-06-28 DIAGNOSIS — R918 Other nonspecific abnormal finding of lung field: Secondary | ICD-10-CM | POA: Insufficient documentation

## 2015-06-29 DIAGNOSIS — H4011X0 Primary open-angle glaucoma, stage unspecified: Secondary | ICD-10-CM | POA: Diagnosis not present

## 2015-06-30 DIAGNOSIS — E0520 Thyrotoxicosis with toxic multinodular goiter without thyrotoxic crisis or storm: Secondary | ICD-10-CM | POA: Diagnosis not present

## 2015-07-07 DIAGNOSIS — E0520 Thyrotoxicosis with toxic multinodular goiter without thyrotoxic crisis or storm: Secondary | ICD-10-CM | POA: Diagnosis not present

## 2015-07-12 DIAGNOSIS — H4011X1 Primary open-angle glaucoma, mild stage: Secondary | ICD-10-CM | POA: Diagnosis not present

## 2015-07-18 ENCOUNTER — Other Ambulatory Visit: Payer: Self-pay | Admitting: Family Medicine

## 2015-07-18 NOTE — Telephone Encounter (Signed)
Patient requesting refill. 

## 2015-07-19 NOTE — Telephone Encounter (Signed)
Appointment made for October

## 2015-07-26 DIAGNOSIS — J310 Chronic rhinitis: Secondary | ICD-10-CM | POA: Diagnosis not present

## 2015-07-26 DIAGNOSIS — R490 Dysphonia: Secondary | ICD-10-CM | POA: Diagnosis not present

## 2015-07-26 DIAGNOSIS — R05 Cough: Secondary | ICD-10-CM | POA: Diagnosis not present

## 2015-07-26 DIAGNOSIS — E6609 Other obesity due to excess calories: Secondary | ICD-10-CM | POA: Diagnosis not present

## 2015-07-26 DIAGNOSIS — R0609 Other forms of dyspnea: Secondary | ICD-10-CM | POA: Diagnosis not present

## 2015-07-26 DIAGNOSIS — R918 Other nonspecific abnormal finding of lung field: Secondary | ICD-10-CM | POA: Diagnosis not present

## 2015-08-03 DIAGNOSIS — R0609 Other forms of dyspnea: Secondary | ICD-10-CM | POA: Diagnosis not present

## 2015-08-08 ENCOUNTER — Encounter: Payer: Self-pay | Admitting: Family Medicine

## 2015-08-08 ENCOUNTER — Ambulatory Visit (INDEPENDENT_AMBULATORY_CARE_PROVIDER_SITE_OTHER): Payer: Medicare Other | Admitting: Family Medicine

## 2015-08-08 VITALS — BP 132/76 | HR 87 | Temp 97.7°F | Resp 16 | Ht 63.0 in | Wt 221.4 lb

## 2015-08-08 DIAGNOSIS — I10 Essential (primary) hypertension: Secondary | ICD-10-CM | POA: Diagnosis not present

## 2015-08-08 DIAGNOSIS — H9193 Unspecified hearing loss, bilateral: Secondary | ICD-10-CM | POA: Insufficient documentation

## 2015-08-08 DIAGNOSIS — E668 Other obesity: Secondary | ICD-10-CM | POA: Diagnosis not present

## 2015-08-08 DIAGNOSIS — J302 Other seasonal allergic rhinitis: Secondary | ICD-10-CM | POA: Insufficient documentation

## 2015-08-08 DIAGNOSIS — J381 Polyp of vocal cord and larynx: Secondary | ICD-10-CM | POA: Diagnosis not present

## 2015-08-08 DIAGNOSIS — M159 Polyosteoarthritis, unspecified: Secondary | ICD-10-CM | POA: Insufficient documentation

## 2015-08-08 DIAGNOSIS — E1121 Type 2 diabetes mellitus with diabetic nephropathy: Secondary | ICD-10-CM | POA: Insufficient documentation

## 2015-08-08 DIAGNOSIS — I8390 Asymptomatic varicose veins of unspecified lower extremity: Secondary | ICD-10-CM | POA: Insufficient documentation

## 2015-08-08 DIAGNOSIS — IMO0002 Reserved for concepts with insufficient information to code with codable children: Secondary | ICD-10-CM

## 2015-08-08 DIAGNOSIS — I517 Cardiomegaly: Secondary | ICD-10-CM | POA: Diagnosis not present

## 2015-08-08 DIAGNOSIS — Z8701 Personal history of pneumonia (recurrent): Secondary | ICD-10-CM | POA: Insufficient documentation

## 2015-08-08 DIAGNOSIS — I071 Rheumatic tricuspid insufficiency: Secondary | ICD-10-CM

## 2015-08-08 DIAGNOSIS — Z862 Personal history of diseases of the blood and blood-forming organs and certain disorders involving the immune mechanism: Secondary | ICD-10-CM | POA: Insufficient documentation

## 2015-08-08 DIAGNOSIS — M7541 Impingement syndrome of right shoulder: Secondary | ICD-10-CM

## 2015-08-08 DIAGNOSIS — D72829 Elevated white blood cell count, unspecified: Secondary | ICD-10-CM | POA: Diagnosis not present

## 2015-08-08 DIAGNOSIS — B001 Herpesviral vesicular dermatitis: Secondary | ICD-10-CM | POA: Insufficient documentation

## 2015-08-08 DIAGNOSIS — G4700 Insomnia, unspecified: Secondary | ICD-10-CM | POA: Insufficient documentation

## 2015-08-08 DIAGNOSIS — Z8781 Personal history of (healed) traumatic fracture: Secondary | ICD-10-CM | POA: Insufficient documentation

## 2015-08-08 DIAGNOSIS — G5600 Carpal tunnel syndrome, unspecified upper limb: Secondary | ICD-10-CM | POA: Insufficient documentation

## 2015-08-08 DIAGNOSIS — M109 Gout, unspecified: Secondary | ICD-10-CM | POA: Diagnosis not present

## 2015-08-08 DIAGNOSIS — E042 Nontoxic multinodular goiter: Secondary | ICD-10-CM | POA: Diagnosis not present

## 2015-08-08 DIAGNOSIS — D126 Benign neoplasm of colon, unspecified: Secondary | ICD-10-CM | POA: Insufficient documentation

## 2015-08-08 DIAGNOSIS — Z23 Encounter for immunization: Secondary | ICD-10-CM | POA: Diagnosis not present

## 2015-08-08 DIAGNOSIS — Z8659 Personal history of other mental and behavioral disorders: Secondary | ICD-10-CM | POA: Insufficient documentation

## 2015-08-08 DIAGNOSIS — R911 Solitary pulmonary nodule: Secondary | ICD-10-CM

## 2015-08-08 DIAGNOSIS — Z923 Personal history of irradiation: Secondary | ICD-10-CM

## 2015-08-08 DIAGNOSIS — E785 Hyperlipidemia, unspecified: Secondary | ICD-10-CM

## 2015-08-08 DIAGNOSIS — M810 Age-related osteoporosis without current pathological fracture: Secondary | ICD-10-CM | POA: Insufficient documentation

## 2015-08-08 DIAGNOSIS — M7540 Impingement syndrome of unspecified shoulder: Secondary | ICD-10-CM | POA: Insufficient documentation

## 2015-08-08 DIAGNOSIS — H409 Unspecified glaucoma: Secondary | ICD-10-CM | POA: Insufficient documentation

## 2015-08-08 DIAGNOSIS — E559 Vitamin D deficiency, unspecified: Secondary | ICD-10-CM

## 2015-08-08 DIAGNOSIS — H269 Unspecified cataract: Secondary | ICD-10-CM | POA: Insufficient documentation

## 2015-08-08 DIAGNOSIS — K649 Unspecified hemorrhoids: Secondary | ICD-10-CM | POA: Insufficient documentation

## 2015-08-08 DIAGNOSIS — M722 Plantar fascial fibromatosis: Secondary | ICD-10-CM

## 2015-08-08 MED ORDER — TRAMADOL HCL 50 MG PO TABS: 50.0000 mg | ORAL_TABLET | Freq: Four times a day (QID) | ORAL | Status: DC | PRN

## 2015-08-08 NOTE — Progress Notes (Signed)
Name: Susan Bowen   MRN: [id number]    DOB: 1932-08-27   Date:08/08/2015       Progress Note  Subjective  Chief Complaint  Chief Complaint  Patient presents with  . Medication Refill    follow-up  . Diabetes    Checks BG 3x weekly Low-110, High-145  . Hypertension    some dizziness and SOB  . Hyperlipidemia  . Referral    Dr. Vella Kohler states she has nodules on vocal cords and wants to go ahead and see ENT    HPI  DMII with microalbuminuria; she is taking medications and also aspirin, ARB and statin therapy. FSBS at home has not been checked lately because she has been out of strips. No polyphagia, polydipsia or polyuria ( except for last week ).   HTN: she has been taking medication, occasionally feels dizzy but mild. No chest pain but has decrease in exercise tolerance  Hyperlipidemia: taking Pravastatin and denies side effects of medications, including no myalgia.   Hoarseness : going on and off past 3 weeks, previous history of vocal cord polyps and was advised by Dr. Raul Del to see ENT  Impingment shoulder right: has occasional pain on right shoulder, but it can be severe and takes Tramadol prn. Prescription shoulder last her until she returns for follow up  Plantar fascitis: she has noticed left heel pain when she first steps down or with pressure on left inner plantar area and heel. No redness or swelling,   Pulmonary nodule/SOB: seen by Dr. Raul Del, lung nodule stable in 18 months recheck next year. She had an echo done that showed Tricuspid regurgitation and LVH, she will be referred to Dr. Clayborn Bigness. Denies chest pain.   Goiter: s/p thyroid ablation, Summer 2016. Seeing Dr. Gabriel Carina, not on medication,   Patient Active Problem List   Diagnosis Date Noted  . Allergic rhinitis, seasonal 08/08/2015  . Benign hypertension 08/08/2015  . History of pneumonia 08/08/2015  . Carpal tunnel syndrome 08/08/2015  . Cataract 08/08/2015  . Insomnia, persistent 08/08/2015  .  Dyslipidemia 08/08/2015  . History of depression 08/08/2015  . Glaucoma 08/08/2015  . Controlled gout 08/08/2015  . H/O infectious disease 08/08/2015  . Bilateral hearing loss 08/08/2015  . Hemorrhoid 08/08/2015  . H/O iron deficiency anemia 08/08/2015  . Benign neoplasm of colon 08/08/2015  . Impingement syndrome of shoulder 08/08/2015  . Osteoporosis, post-menopausal 08/08/2015  . Adult BMI 30+ 08/08/2015  . Generalized OA 08/08/2015  . Multinodular goiter 08/08/2015  . Abnormal presence of protein in urine 08/08/2015  . Asymptomatic varicose veins 08/08/2015  . Polyp of vocal cord 08/08/2015  . Wedge fracture of thoracic vertebra ([place]) 08/08/2015  . Diabetes mellitus with proteinuric diabetic nephropathy ([place]) 08/08/2015  . LVH (left ventricular hypertrophy) 08/08/2015  . Moderate tricuspid regurgitation 08/08/2015  . History of radioactive iodine thyroid ablation 08/08/2015  . Lung nodule, solitary 05/12/2014  . Chronic cough 05/11/2014  . Elevated WBC count 05/11/2014  . Vitamin D deficiency 12/20/2009  . Plantar fascial fibromatosis 12/20/2009  . Personal history of fall 07/20/2008  . Cervical radiculitis 05/11/2008    Past Surgical History  Procedure Laterality Date  . Fracture surgery  2001    left femur  . Polyp removed from vocal cord    . Carpal tunnel release    . Appendectomy    . Abdominal hysterectomy      Family History  Problem Relation Age of Onset  . Diabetes Mother   . Hypertension Mother   .  Dementia Mother   . Hypertension Son     Social History   Social History  . Marital Status: Divorced    Spouse Name: N/A  . Number of Children: N/A  . Years of Education: N/A   Occupational History  . Not on file.   Social History Main Topics  . Smoking status: Never Smoker   . Smokeless tobacco: Never Used  . Alcohol Use: No  . Drug Use: No  . Sexual Activity: Not Currently   Other Topics Concern  . Not on file   Social History Narrative      Current outpatient prescriptions:  .  aspirin (ASPIRIN LOW DOSE) 81 MG chewable tablet, Chew 1 tablet by mouth daily., Disp: , Rfl:  .  BREO ELLIPTA 100-25 MCG/INH AEPB, Inhale 1 Inhaler into the lungs daily., Disp: , Rfl: 11 .  carvedilol (COREG) 25 MG tablet, Take 1 tablet by mouth 2 (two) times daily., Disp: , Rfl:  .  cloNIDine (CATAPRES) 0.1 MG tablet, Take 0.1 mg by mouth at bedtime., Disp: , Rfl: 1 .  diclofenac sodium (VOLTAREN) 1 % GEL, Apply 1 application topically as needed., Disp: , Rfl:  .  fluticasone (FLONASE) 50 MCG/ACT nasal spray, Place 1 spray into the nose daily., Disp: , Rfl:  .  JANUVIA 100 MG tablet, Take 100 mg by mouth daily., Disp: , Rfl: 1 .  loratadine (CLARITIN) 10 MG tablet, TAKE 1 TABLET EVERY DAY, Disp: 30 tablet, Rfl: 0 .  metFORMIN (GLUCOPHAGE) 850 MG tablet, Take 850 mg by mouth 2 (two) times daily., Disp: , Rfl: 1 .  Multiple Vitamin (MULTI-VITAMINS) TABS, Take 1 tablet by mouth daily., Disp: , Rfl:  .  pravastatin (PRAVACHOL) 40 MG tablet, Take 40 mg by mouth daily., Disp: , Rfl: 1 .  timolol (TIMOPTIC) 0.5 % ophthalmic solution, Place 1 drop into both eyes 2 (two) times daily., Disp: , Rfl: 5 .  traMADol (ULTRAM) 50 MG tablet, Take 1 tablet by mouth every 6 (six) hours as needed., Disp: , Rfl:  .  travoprost, benzalkonium, (TRAVATAN) 0.004 % ophthalmic solution, Apply 1 drop to eye every evening., Disp: , Rfl:  .  TRIBENZOR 40-10-25 MG TABS, Take 1 tablet by mouth daily., Disp: , Rfl: 1 .  Vitamin D, Ergocalciferol, (DRISDOL) 50000 UNITS CAPS capsule, TAKE ONE CAPSULE BY MOUTH ONCE A WEEK, Disp: 12 capsule, Rfl: 0  Allergies  Allergen Reactions  . Ace Inhibitors   . Atorvastatin   . Augmentin [Amoxicillin-Pot Clavulanate] Itching  . Cyclobenzaprine Hcl     pt cannot take because she has gluacoma  . Nsaids   . Rosuvastatin      ROS  Constitutional: Negative for fever or weight change.  Respiratory: Positive for cough and shortness of  breath.   Cardiovascular: Negative for chest pain or palpitations.  Gastrointestinal: Negative for abdominal pain, no bowel changes.  Musculoskeletal: Negative for gait problem or joint swelling.  Skin: Negative for rash.  Neurological: Negative for dizziness or headache.  No other specific complaints in a complete review of systems (except as listed in HPI above).  Objective  Filed Vitals:   08/08/15 1006  BP: 132/76  Pulse: 87  Temp: 97.7 F (36.5 C)  TempSrc: Oral  Resp: 16  Height: 5\' 3"  (1.6 m)  Weight: 221 lb 6.4 oz (100.426 kg)  SpO2: 93%    Body mass index is 39.23 kg/(m^2).  Physical Exam  Constitutional: Patient appears well-developed and well-nourished. ObeseNo distress.  HEENT: head  atraumatic, normocephalic, pupils equal and reactive to light,neck supple, throat within normal limits Cardiovascular: Normal rate, regular rhythm and normal heart sounds.  No murmur heard. No BLE edema. Pulmonary/Chest: Effort normal and breath sounds normal. No respiratory distress. Abdominal: Soft.  There is no tenderness. Psychiatric: Patient has a normal mood and affect. behavior is normal. Judgment and thought content normal. Muscular Skeletal: pain during palpation of right inner left heel, no swelling. Also has positive impingement sign of right righout  Diabetic Foot Exam: Diabetic Foot Exam - Simple   Simple Foot Form  Visual Inspection  See comments:  Yes  Sensation Testing  Intact to touch and monofilament testing bilaterally:  Yes  Pulse Check  Posterior Tibialis and Dorsalis pulse intact bilaterally:  Yes  Comments  Thick nails       PHQ2/9: Depression screen PHQ 2/9 08/08/2015  Decreased Interest 0  Down, Depressed, Hopeless 0  PHQ - 2 Score 0     Fall Risk: Fall Risk  08/08/2015  Falls in the past year? No     Functional Status Survey: Is the patient deaf or have difficulty hearing?: No Does the patient have difficulty seeing, even when wearing  glasses/contacts?: Yes (glasses) Does the patient have difficulty concentrating, remembering, or making decisions?: No Does the patient have difficulty walking or climbing stairs?: No Does the patient have difficulty dressing or bathing?: No Does the patient have difficulty doing errands alone such as visiting a doctor's office or shopping?: Yes (does not drive)    Assessment & Plan  1. Diabetes mellitus with proteinuric diabetic nephropathy (HCC)  Continue medication, recheck labs, discussed importance of compliance with diet, had eye exam recently , and will sign a release of medical records - Hemoglobin A1c  2. Needs flu shot  - Flu Vaccine QUAD 36+ mos PF IM (Fluarix & Fluzone Quad PF)  3. LVH (left ventricular hypertrophy)  - Ambulatory referral to Cardiology  4. Moderate tricuspid regurgitation  - Ambulatory referral to Cardiology  5. Benign hypertension  - Comprehensive metabolic panel  6. Polyp of vocal cord  - Ambulatory referral to ENT  7. Multinodular goiter  Sees Dr. Gabriel Carina   8. Elevated WBC count  - CBC with Differential/Platelet  9. Controlled gout  She was on Allopurinol but stopped a long time ago  10. Vitamin D deficiency  - Vit D  25 hydroxy (rtn osteoporosis monitoring)  11. Lung nodule, solitary  Stable in over 13 months, repeat in 06/2016  12. Adult BMI 30+  Discussed with the patient the risk posed by an increased BMI. Discussed importance of portion control, calorie counting and at least 150 minutes of physical activity weekly. Avoid sweet beverages and drink more water. Eat at least 6 servings of fruit and vegetables daily   13. Impingement syndrome of shoulder, right  - traMADol (ULTRAM) 50 MG tablet; Take 1 tablet (50 mg total) by mouth every 6 (six) hours as needed.  Dispense: 60 tablet; Refill: 0  14. Dyslipidemia  - Lipid panel  15. History of radioactive iodine thyroid ablation  Sees Dr. Gabriel Carina   16. Plantar fasciitis,  left  Advised insoles, ice with a water bottle

## 2015-08-08 NOTE — Patient Instructions (Signed)
Plantar Fasciitis  Plantar fasciitis is a common condition that causes foot pain. It is soreness (inflammation) of the band of tough fibrous tissue on the bottom of the foot that runs from the heel bone (calcaneus) to the ball of the foot. The cause of this soreness may be from excessive standing, poor fitting shoes, running on hard surfaces, being overweight, having an abnormal walk, or overuse (this is common in runners) of the painful foot or feet. It is also common in aerobic exercise dancers and ballet dancers.  SYMPTOMS   Most people with plantar fasciitis complain of:   Severe pain in the morning on the bottom of their foot especially when taking the first steps out of bed. This pain recedes after a few minutes of walking.   Severe pain is experienced also during walking following a long period of inactivity.   Pain is worse when walking barefoot or up stairs  DIAGNOSIS    Your caregiver will diagnose this condition by examining and feeling your foot.   Special tests such as X-rays of your foot, are usually not needed.  PREVENTION    Consult a sports medicine professional before beginning a new exercise program.   Walking programs offer a good workout. With walking there is a lower chance of overuse injuries common to runners. There is less impact and less jarring of the joints.   Begin all new exercise programs slowly. If problems or pain develop, decrease the amount of time or distance until you are at a comfortable level.   Wear good shoes and replace them regularly.   Stretch your foot and the heel cords at the back of the ankle (Achilles tendon) both before and after exercise.   Run or exercise on even surfaces that are not hard. For example, asphalt is better than pavement.   Do not run barefoot on hard surfaces.   If using a treadmill, vary the incline.   Do not continue to workout if you have foot or joint problems. Seek professional help if they do not improve.  HOME CARE INSTRUCTIONS     Avoid activities that cause you pain until you recover.   Use ice or cold packs on the problem or painful areas after working out.   Only take over-the-counter or prescription medicines for pain, discomfort, or fever as directed by your caregiver.   Soft shoe inserts or athletic shoes with air or gel sole cushions may be helpful.   If problems continue or become more severe, consult a sports medicine caregiver or your own health care provider. Cortisone is a potent anti-inflammatory medication that may be injected into the painful area. You can discuss this treatment with your caregiver.  MAKE SURE YOU:    Understand these instructions.   Will watch your condition.   Will get help right away if you are not doing well or get worse.  Document Released: 07/16/2001 Document Revised: 01/13/2012 Document Reviewed: 09/14/2008  ExitCare Patient Information 2015 [employer]. This information is not intended to replace advice given to you by your health care provider. Make sure you discuss any questions you have with your health care provider.

## 2015-08-16 ENCOUNTER — Other Ambulatory Visit: Payer: Self-pay | Admitting: Family Medicine

## 2015-08-17 DIAGNOSIS — R490 Dysphonia: Secondary | ICD-10-CM | POA: Diagnosis not present

## 2015-08-17 DIAGNOSIS — R0982 Postnasal drip: Secondary | ICD-10-CM | POA: Diagnosis not present

## 2015-08-18 ENCOUNTER — Other Ambulatory Visit: Payer: Self-pay | Admitting: Family Medicine

## 2015-08-18 MED ORDER — GLUCOSE BLOOD VI STRP: ORAL_STRIP | Status: DC

## 2015-08-18 NOTE — Telephone Encounter (Signed)
Patient is requesting a refill on Freestyle Freedom test strips. Please send to [facility]

## 2015-08-29 ENCOUNTER — Ambulatory Visit: Payer: Medicare Other | Attending: Unknown Physician Specialty | Admitting: Speech Pathology

## 2015-08-29 ENCOUNTER — Encounter: Payer: Self-pay | Admitting: Speech Pathology

## 2015-08-29 DIAGNOSIS — R490 Dysphonia: Secondary | ICD-10-CM | POA: Diagnosis not present

## 2015-08-29 NOTE — Therapy (Signed)
[place] MAIN [facility] SERVICES [address] [place], [place], [address] Phone: [phone]   Fax:  [phone]  Speech Language Pathology Evaluation  Patient Details  Name: Susan Bowen MRN: [id number] Date of Birth: [birth year] Referring Provider: Beverly Gust, MD  Encounter Date: 08/29/2015      End of Session - 08/29/15 1554    Visit Number 1   Number of Visits 9   Date for SLP Re-Evaluation 11/03/15   SLP Start Time 1445   SLP Stop Time  77   SLP Time Calculation (min) 45 min   Activity Tolerance Patient tolerated treatment well      Past Medical History  Diagnosis Date   Generalized osteoarthritis    Insomnia    Hyperlipidemia    Hypertension    Diabetes mellitus without complication (HCC)    Proteinuria    Obesity    Lipoma    Rotator cuff tear    Carpal tunnel syndrome    Tinnitus of both ears    Wedge compression fracture of t11-T12 vertebra, sequela    Allergy    Bunion    Gout    Osteoporosis    Neuritis or radiculitis due to rupture of lumbar intervertebral disc    Hemorrhoids without complication    Lung nodule    Vitamin D deficiency    Impingement syndrome of right shoulder    Unspecified glaucoma    Cataracta     Past Surgical History  Procedure Laterality Date   Fracture surgery  2001    left femur   Polyp removed from vocal cord     Carpal tunnel release     Appendectomy     Abdominal hysterectomy      There were no vitals filed for this visit.  Visit Diagnosis: Dysphonia - Plan: SLP plan of care cert/re-cert      Subjective Assessment - 08/29/15 1553    Subjective 81 year old woman, under the care of Dr. Tami Ribas, with abnormal laryngeal findings including mild vocal cord bowing.  The patient reports hoarseness for approximately 8 months.   Patient is accompained by: Family member   Currently in Pain? No/denies            SLP Evaluation [facility] -  08/29/15 0001    SLP Visit Information   SLP Received On 08/29/15   Referring Provider Beverly Gust, MD   Onset Date 08/17/2015   Medical Diagnosis Bowed vocal cords   Subjective   Subjective "I sound like a man"   Patient/Family Stated Goal Improved vocal quality   Prior Functional Status   Cognitive/Linguistic Baseline Within functional limits  Hoarseness X 8 months   Oral Motor/Sensory Function   Overall Oral Motor/Sensory Function Appears within functional limits for tasks assessed   Motor Speech   Overall Motor Speech Appears within functional limits for tasks assessed  With the exception of voice, WNL   Standardized Assessments   Standardized Assessments  Other Assessment  Perceptual Voice Evaluation      Perceptual Voice Evaluation  Voice history: 81 year old woman, under the care of Dr. Tami Ribas, with abnormal laryngeal findings including mild vocal cord bowing.  The patient reports hoarseness for approximately 8 months.  Voice checklist:  Health risks: reduced water intake, allergies, xerostomia  Characteristic voice use: talks on the phone all day long; reporting 2 and 3 hour long phone conversations  Environmental risks: none identified  Misuse: glottal fry  Abuse: daily AM coughing,  frequent throat clearing  Vocal characteristics: identifies vocal fatigue, hoarseness, and lowered pitch  Patient Quality of Life Survey: Voice Handicap Index-10 Score of 5  A score of 10 or higher indicates perceived handicap  Maximum phonation time for sustained ah: 5 seconds  Average fundamental frequency during sustained ah: 197 Hz (1.7 STD below average for age and gender)  Average time patient was able to sustain /s/: 4.7 seconds  Average time patient was able to sustain /z/: 3.7 seconds  s/z ratio : 1.3  Highest dynamic pitch when altering pitch from a low note to a high note: 427 Hz  Lowest dynamic pitch when altering from a high note to a low note: 212  Hz  Visi-Pitch: Multi-Dimensional Voice Program (MDVP)  MDVP extracts objective quantitative values (Relative Average Perturbation, Shimmer, Voice Turbulence Index, and Noise to Harmonic Ratio) on sustained phonation, which are displayed graphically and numerically in comparison to a built-in normative database.  The patient exhibited values outside the norm for Relative Average Perturbation, Shimmer, Voice Turbulence Index, and Noise to Harmonic Ratio.  Average fundamental frequency was 1.7 STD below the average for age and gender. The patient improved all parameters when cued to alter voicing (loud like me).           SLP Education - 2015-09-09 1553    Education provided Yes   Education Details Outline of voice therapy   Person(s) Educated Patient;Child(ren)   Methods Explanation;Demonstration   Comprehension Verbalized understanding;Returned demonstration;Verbal cues required;Need further instruction            SLP Long Term Goals - 09/09/2015 1556    SLP LONG TERM GOAL #1   Title The patient will demonstrate independent understanding of vocal hygiene concepts and neck, shoulder, lingual stretching exercises.   Time 8   Period Weeks   Status New   SLP LONG TERM GOAL #2   Title The patient will be independent for abdominal breathing and breath support exercises.   Time 8   Period Weeks   Status New   SLP LONG TERM GOAL #3   Title The patient will maximize voice quality and loudness using breath support for sustained vowel production, pitch glides, and hierarchal speech drill.   Time 8   Period Weeks   Status New   SLP LONG TERM GOAL #4   Title The patient will maximize voice quality and loudness using breath support for paragraph length recitation with 80% accuracy.   Time 8   Period Weeks   Status New          Plan - 09-Sep-2015 1555    Clinical Impression Statement This 81 year old woman with vocal cord bowing is presenting with moderate dysphonia.  The patient  demonstrates hoarse vocal quality, reduced breath control for speech, strained/tense phonation, limited pitch range, and laryngeal tension. She will benefit from voice therapy for education, to improve breath support, improve tone focus, promote easy flow phonation, and learn techniques to increase loudness and pitch range without strain.   Speech Therapy Frequency 1x /week   Duration Other (comment)  8 weeks   Potential to Achieve Goals Good   Potential Considerations Ability to learn/carryover information;Cooperation/participation level;Previous level of function;Severity of impairments;Family/community support   SLP Home Exercise Plan To be developed   Consulted and Agree with Plan of Care Patient;Family member/caregiver   Family Member Consulted Daughter          G-Codes - 09-09-2015 1559    Functional Assessment Tool Used Perceptual  Voice Evaluation   Functional Limitations Voice   Voice Current Status [phone]) At least 40 percent but less than 60 percent impaired, limited or restricted   Voice Goal Status (G9172) At least 1 percent but less than 20 percent impaired, limited or restricted      Problem List Patient Active Problem List   Diagnosis Date Noted   Allergic rhinitis, seasonal 08/08/2015   Benign hypertension 08/08/2015   History of pneumonia 08/08/2015   Carpal tunnel syndrome 08/08/2015   Cataract 08/08/2015   Insomnia, persistent 08/08/2015   Dyslipidemia 08/08/2015   History of depression 08/08/2015   Glaucoma 08/08/2015   Controlled gout 08/08/2015   H/O infectious disease 08/08/2015   Bilateral hearing loss 08/08/2015   Hemorrhoid 08/08/2015   H/O iron deficiency anemia 08/08/2015   Benign neoplasm of colon 08/08/2015   Impingement syndrome of shoulder 08/08/2015   Osteoporosis, post-menopausal 08/08/2015   Adult BMI 30+ 08/08/2015   Generalized OA 08/08/2015   Multinodular goiter 08/08/2015   Abnormal presence of protein in urine  08/08/2015   Asymptomatic varicose veins 08/08/2015   Polyp of vocal cord 08/08/2015   Wedge fracture of thoracic vertebra ([place]) 08/08/2015   Diabetes mellitus with proteinuric diabetic nephropathy ([place]) 08/08/2015   LVH (left ventricular hypertrophy) 08/08/2015   Moderate tricuspid regurgitation 08/08/2015   History of radioactive iodine thyroid ablation 08/08/2015   Lung nodule, solitary 05/12/2014   Chronic cough 05/11/2014   Elevated WBC count 05/11/2014   Vitamin D deficiency 12/20/2009   Plantar fascial fibromatosis 12/20/2009   Personal history of fall 07/20/2008   Cervical radiculitis 05/11/2008   Leroy Sea, MS/CCC- SLP  Lou Miner 08/29/2015, 4:14 PM  [place] MAIN [facility] SERVICES [address] [place], [place], [address] Phone: [phone]   Fax:  [phone]  Name: JONELLE BANN MRN: [id number] Date of Birth: Feb 17, 1932

## 2015-09-05 ENCOUNTER — Ambulatory Visit: Payer: Medicare Other | Admitting: Family Medicine

## 2015-09-07 ENCOUNTER — Ambulatory Visit: Payer: Medicare Other | Attending: Unknown Physician Specialty | Admitting: Speech Pathology

## 2015-09-07 DIAGNOSIS — R490 Dysphonia: Secondary | ICD-10-CM

## 2015-09-08 ENCOUNTER — Encounter: Payer: Self-pay | Admitting: Speech Pathology

## 2015-09-08 NOTE — Therapy (Signed)
[place] MAIN [facility] SERVICES [address] [place], [place], [address] Phone: [phone]   Fax:  [phone]  Speech Language Pathology Treatment  Patient Details  Name: Susan Bowen MRN: [id number] Date of Birth: August 25, 1932 Referring Provider: Beverly Gust, MD  Encounter Date: 09/07/2015      End of Session - 09/08/15 0943    Visit Number 2   Number of Visits 9   Date for SLP Re-Evaluation 11/03/15   SLP Start Time 1600   SLP Stop Time  1645   SLP Time Calculation (min) 45 min   Activity Tolerance Patient tolerated treatment well      Past Medical History  Diagnosis Date  . Generalized osteoarthritis   . Insomnia   . Hyperlipidemia   . Hypertension   . Diabetes mellitus without complication ([place])   . Proteinuria   . Obesity   . Lipoma   . Rotator cuff tear   . Carpal tunnel syndrome   . Tinnitus of both ears   . Wedge compression fracture of t11-T12 vertebra, sequela   . Allergy   . Bunion   . Gout   . Osteoporosis   . Neuritis or radiculitis due to rupture of lumbar intervertebral disc   . Hemorrhoids without complication   . Lung nodule   . Vitamin D deficiency   . Impingement syndrome of right shoulder   . Unspecified glaucoma   . Cataracta     Past Surgical History  Procedure Laterality Date  . Fracture surgery  2001    left femur  . Polyp removed from vocal cord    . Carpal tunnel release    . Appendectomy    . Abdominal hysterectomy      There were no vitals filed for this visit.  Visit Diagnosis: Dysphonia      Subjective Assessment - 09/08/15 0941    Subjective The patient reports that her symptoms are improved since she began using the nasal spray recommended by Dr. Tami Ribas.   Currently in Pain? No/denies               ADULT SLP TREATMENT - 09/08/15 0001    General Information   Behavior/Cognition Alert;Cooperative;Pleasant mood   HPI Vocal cord bowing   Treatment Provided   Treatment provided Cognitive-Linquistic   Pain Assessment   Pain Assessment No/denies pain   Cognitive-Linquistic Treatment   Treatment focused on Voice   Skilled Treatment The patient was provided with written and verbal teaching regarding neck, tongue, and throat stretches to promote relaxed phonation.  The patient was provided with written and verbal teaching regarding abdominal breathing exercises.  Patient demonstrates accurate execution of exercises.  Patient instructed in use of vocal loudness to generate clear vocal quality without stress/strain. Patient was able to generate clear vocal quality with focus on vocal loudness.  Read phrases/sentences with oral resonance and clear vocal quality.  Generate phrases/sentences with oral resonance and clear vocal quality with 60% accuracy.     Assessment / Recommendations / Plan   Plan Continue with current plan of care   Progression Toward Goals   Progression toward goals Progressing toward goals          SLP Education - 09/08/15 0942    Education provided Yes   Education Details Neck, tongue, and throat stretches; abdominal breathing exercises; vocal loudness   Person(s) Educated Patient   Methods Explanation;Demonstration;Verbal cues;Handout   Comprehension Verbalized understanding;Returned demonstration;Verbal cues required;Need further instruction  SLP Long Term Goals - 08/29/15 1556    SLP LONG TERM GOAL #1   Title The patient will demonstrate independent understanding of vocal hygiene concepts and neck, shoulder, lingual stretching exercises.   Time 8   Period Weeks   Status New   SLP LONG TERM GOAL #2   Title The patient will be independent for abdominal breathing and breath support exercises.   Time 8   Period Weeks   Status New   SLP LONG TERM GOAL #3   Title The patient will maximize voice quality and loudness using breath support for sustained vowel production, pitch glides, and hierarchal speech drill.    Time 8   Period Weeks   Status New   SLP LONG TERM GOAL #4   Title The patient will maximize voice quality and loudness using breath support for paragraph length recitation with 80% accuracy.   Time 8   Period Weeks   Status New          Plan - 09/08/15 0944    Clinical Impression Statement The patient is able to generate a clear vocal quality using vocal loudness.  She is performing the relaxation and breath support exercises well.     Speech Therapy Frequency 1x /week   Duration Other (comment)   Potential to Achieve Goals Good   Potential Considerations Ability to learn/carryover information;Cooperation/participation level;Previous level of function;Severity of impairments;Family/community support   SLP Home Exercise Plan Voice building exercise routine   Consulted and Agree with Plan of Care Patient        Problem List Patient Active Problem List   Diagnosis Date Noted  . Allergic rhinitis, seasonal 08/08/2015  . Benign hypertension 08/08/2015  . History of pneumonia 08/08/2015  . Carpal tunnel syndrome 08/08/2015  . Cataract 08/08/2015  . Insomnia, persistent 08/08/2015  . Dyslipidemia 08/08/2015  . History of depression 08/08/2015  . Glaucoma 08/08/2015  . Controlled gout 08/08/2015  . H/O infectious disease 08/08/2015  . Bilateral hearing loss 08/08/2015  . Hemorrhoid 08/08/2015  . H/O iron deficiency anemia 08/08/2015  . Benign neoplasm of colon 08/08/2015  . Impingement syndrome of shoulder 08/08/2015  . Osteoporosis, post-menopausal 08/08/2015  . Adult BMI 30+ 08/08/2015  . Generalized OA 08/08/2015  . Multinodular goiter 08/08/2015  . Abnormal presence of protein in urine 08/08/2015  . Asymptomatic varicose veins 08/08/2015  . Polyp of vocal cord 08/08/2015  . Wedge fracture of thoracic vertebra ([place]) 08/08/2015  . Diabetes mellitus with proteinuric diabetic nephropathy ([place]) 08/08/2015  . LVH (left ventricular hypertrophy) 08/08/2015  . Moderate  tricuspid regurgitation 08/08/2015  . History of radioactive iodine thyroid ablation 08/08/2015  . Lung nodule, solitary 05/12/2014  . Chronic cough 05/11/2014  . Elevated WBC count 05/11/2014  . Vitamin D deficiency 12/20/2009  . Plantar fascial fibromatosis 12/20/2009  . Personal history of fall 07/20/2008  . Cervical radiculitis 05/11/2008   Leroy Sea, MS/CCC- SLP  Lou Miner 09/08/2015, 9:45 AM  [place] MAIN [facility] SERVICES [address] [place], [place], [address] Phone: [phone]   Fax:  [phone]   Name: MARLISA CARIDI MRN: [id number] Date of Birth: [birth year]

## 2015-09-14 ENCOUNTER — Ambulatory Visit: Payer: Medicare Other | Admitting: Speech Pathology

## 2015-09-19 ENCOUNTER — Ambulatory Visit: Payer: Medicare Other | Admitting: Speech Pathology

## 2015-09-19 ENCOUNTER — Encounter: Payer: Self-pay | Admitting: Speech Pathology

## 2015-09-19 DIAGNOSIS — R490 Dysphonia: Secondary | ICD-10-CM

## 2015-09-19 NOTE — Therapy (Signed)
[place] MAIN [facility] SERVICES [address] [place], [place], [address] Phone: [phone]   Fax:  [phone]  Speech Language Pathology Treatment/Discharge Summary  Patient Details  Name: Susan Bowen MRN: [id number] Date of Birth: May 19, 1932 Referring Provider: Beverly Gust, MD  Encounter Date: 09/19/2015      End of Session - 09/19/15 1559    Visit Number 3   Number of Visits 9   Date for SLP Re-Evaluation 11/03/15   SLP Start Time 22   SLP Stop Time  5056   SLP Time Calculation (min) 45 min   Activity Tolerance Patient tolerated treatment well      Past Medical History  Diagnosis Date  . Generalized osteoarthritis   . Insomnia   . Hyperlipidemia   . Hypertension   . Diabetes mellitus without complication ([place])   . Proteinuria   . Obesity   . Lipoma   . Rotator cuff tear   . Carpal tunnel syndrome   . Tinnitus of both ears   . Wedge compression fracture of t11-T12 vertebra, sequela   . Allergy   . Bunion   . Gout   . Osteoporosis   . Neuritis or radiculitis due to rupture of lumbar intervertebral disc   . Hemorrhoids without complication   . Lung nodule   . Vitamin D deficiency   . Impingement syndrome of right shoulder   . Unspecified glaucoma   . Cataracta     Past Surgical History  Procedure Laterality Date  . Fracture surgery  2001    left femur  . Polyp removed from vocal cord    . Carpal tunnel release    . Appendectomy    . Abdominal hysterectomy      There were no vitals filed for this visit.  Visit Diagnosis: Dysphonia      Subjective Assessment - 09/19/15 1558    Subjective The patient reports that she has not been hoarse and she was able to sing in church this weekend.    Currently in Pain? No/denies               ADULT SLP TREATMENT - 09/19/15 0001    General Information   Behavior/Cognition Alert;Cooperative;Pleasant mood   HPI Vocal cord bowing   Treatment Provided   Treatment provided Cognitive-Linquistic   Pain Assessment   Pain Assessment No/denies pain   Cognitive-Linquistic Treatment   Treatment focused on Voice   Skilled Treatment The patient was provided with written and verbal teaching regarding neck, tongue, and throat stretches to promote relaxed phonation.  The patient was provided with written and verbal teaching regarding abdominal breathing exercises.  Patient demonstrates accurate execution of exercises.  Patient instructed in use of vocal loudness to generate clear vocal quality without stress/strain. Patient was able to generate clear vocal quality with focus on vocal loudness.  Read phrases/sentences with oral resonance and clear vocal quality.  Generate phrases/sentences with oral resonance and clear vocal quality with 100% accuracy.     Assessment / Recommendations / Plan   Plan Discharge SLP treatment due to (comment);All goals met   Progression Toward Goals   Progression toward goals Goals met, education completed, patient discharged from [place] Education - 09/19/15 1558    Education provided Yes   Education Details Neck, tongue, and throat stretches; abdominal breathing exercises; vocal loudness   Person(s) Educated Patient   Methods Explanation;Demonstration;Verbal cues;Handout   Comprehension Verbalized  understanding;Returned demonstration            SLP Long Term Goals - Oct 11, 2015 1600    SLP LONG TERM GOAL #1   Title The patient will demonstrate independent understanding of vocal hygiene concepts and neck, shoulder, lingual stretching exercises.   Time 8   Period Weeks   Status Achieved   SLP LONG TERM GOAL #2   Title The patient will be independent for abdominal breathing and breath support exercises.   Time 8   Period Weeks   Status Achieved   SLP LONG TERM GOAL #3   Title The patient will maximize voice quality and loudness using breath support for sustained vowel production, pitch glides, and  hierarchal speech drill.   Time 8   Period Weeks   Status Achieved   SLP LONG TERM GOAL #4   Title The patient will maximize voice quality and loudness using breath support for paragraph length recitation with 80% accuracy.   Time 8   Period Weeks   Status Achieved          Plan - 11-Oct-2015 1559    Clinical Impression Statement The patient has met her goals and is ready for discharge.  She is able to generate a clear vocal quality using vocal loudness and maintain clear vocal quality in conversation.  She is performing the relaxation and breath support exercises well.     Speech Therapy Frequency 1x /week   Duration Other (comment)   Potential to Achieve Goals Good   Potential Considerations Ability to learn/carryover information;Cooperation/participation level;Previous level of function;Severity of impairments;Family/community support   SLP Home Exercise Plan Voice building exercise routine   Consulted and Agree with Plan of Care Patient          G-Codes - Oct 11, 2015 1601    Functional Assessment Tool Used voice therapy   Functional Limitations Voice   Voice Current Status [phone]) At least 1 percent but less than 20 percent impaired, limited or restricted   Voice Goal Status (O9735) At least 1 percent but less than 20 percent impaired, limited or restricted   Voice Discharge Status (H2992) At least 1 percent but less than 20 percent impaired, limited or restricted      Problem List Patient Active Problem List   Diagnosis Date Noted  . Allergic rhinitis, seasonal 08/08/2015  . Benign hypertension 08/08/2015  . History of pneumonia 08/08/2015  . Carpal tunnel syndrome 08/08/2015  . Cataract 08/08/2015  . Insomnia, persistent 08/08/2015  . Dyslipidemia 08/08/2015  . History of depression 08/08/2015  . Glaucoma 08/08/2015  . Controlled gout 08/08/2015  . H/O infectious disease 08/08/2015  . Bilateral hearing loss 08/08/2015  . Hemorrhoid 08/08/2015  . H/O iron deficiency  anemia 08/08/2015  . Benign neoplasm of colon 08/08/2015  . Impingement syndrome of shoulder 08/08/2015  . Osteoporosis, post-menopausal 08/08/2015  . Adult BMI 30+ 08/08/2015  . Generalized OA 08/08/2015  . Multinodular goiter 08/08/2015  . Abnormal presence of protein in urine 08/08/2015  . Asymptomatic varicose veins 08/08/2015  . Polyp of vocal cord 08/08/2015  . Wedge fracture of thoracic vertebra ([place]) 08/08/2015  . Diabetes mellitus with proteinuric diabetic nephropathy ([place]) 08/08/2015  . LVH (left ventricular hypertrophy) 08/08/2015  . Moderate tricuspid regurgitation 08/08/2015  . History of radioactive iodine thyroid ablation 08/08/2015  . Lung nodule, solitary 05/12/2014  . Chronic cough 05/11/2014  . Elevated WBC count 05/11/2014  . Vitamin D deficiency 12/20/2009  . Plantar fascial fibromatosis 12/20/2009  . Personal history  of fall 07/20/2008  . Cervical radiculitis 05/11/2008   Leroy Sea, MS/CCC- SLP  Lou Miner 09/19/2015, 4:02 PM  [place] MAIN [facility] SERVICES [address] [place], [place], [address] Phone: [phone]   Fax:  [phone]   Name: Susan Bowen MRN: [id number] Date of Birth: 1932/10/10

## 2015-09-26 ENCOUNTER — Encounter: Payer: Self-pay | Admitting: Cardiovascular Disease

## 2015-09-26 ENCOUNTER — Ambulatory Visit: Payer: Medicare Other | Admitting: Speech Pathology

## 2015-09-26 ENCOUNTER — Ambulatory Visit (INDEPENDENT_AMBULATORY_CARE_PROVIDER_SITE_OTHER): Payer: Medicare Other | Admitting: Cardiovascular Disease

## 2015-09-26 VITALS — BP 130/68 | HR 77 | Ht 66.0 in | Wt 219.5 lb

## 2015-09-26 DIAGNOSIS — R0789 Other chest pain: Secondary | ICD-10-CM | POA: Diagnosis not present

## 2015-09-26 DIAGNOSIS — I517 Cardiomegaly: Secondary | ICD-10-CM | POA: Diagnosis not present

## 2015-09-26 DIAGNOSIS — I10 Essential (primary) hypertension: Secondary | ICD-10-CM | POA: Diagnosis not present

## 2015-09-26 DIAGNOSIS — R079 Chest pain, unspecified: Secondary | ICD-10-CM

## 2015-09-26 DIAGNOSIS — I350 Nonrheumatic aortic (valve) stenosis: Secondary | ICD-10-CM | POA: Insufficient documentation

## 2015-09-26 DIAGNOSIS — I071 Rheumatic tricuspid insufficiency: Secondary | ICD-10-CM

## 2015-09-26 NOTE — Assessment & Plan Note (Signed)
The chest pain is overall atypical but she has multiple risk factors for coronary artery disease. Thus, I requested a pharmacologic nuclear stress test for evaluation. She is not able to exercise on a treadmill due to significant arthritis.

## 2015-09-26 NOTE — Assessment & Plan Note (Signed)
She has no tricuspid regurgitation murmur and no signs of right-sided heart failure. This was reported to be moderate and does not seem to be symptomatic at the present time. There was only mild pulmonary hypertension. This can be monitored clinically.

## 2015-09-26 NOTE — Progress Notes (Signed)
Primary care physician: Dr. Ancil Boozer  HPI   this is a pleasant 81 year old African-American female who was referred for evaluation of an abnormal echocardiogram. The patient has multiple chronic medical conditions that include hypertension, diabetes and hyperlipidemia.  She reports having a cardiac murmur for many years and has been seen by Dr. Clayborn Bigness in the past. She  remembers having to stress test in her life most recently 2 years ago. She reports chronic exertional dyspnea with no recent worsening. She also reports recent episodes of intermittent substernal chest tightness lasting for a few seconds happening both with activities and at rest.  she had an echocardiogram done at [facility]  This year which showed normal LV systolic function, moderate left ventricular hypertrophy , mild aortic stenosis , moderate tricuspid regurgitation and mild pulmonary hypertension.  She is not a smoker and has no family history of premature coronary artery disease.  Allergies  Allergen Reactions  . Ace Inhibitors   . Atorvastatin   . Augmentin [Amoxicillin-Pot Clavulanate] Itching  . Cyclobenzaprine Hcl     pt cannot take because she has gluacoma  . Nsaids   . Rosuvastatin      Current Outpatient Prescriptions on File Prior to Visit  Medication Sig Dispense Refill  . aspirin (ASPIRIN LOW DOSE) 81 MG chewable tablet Chew 1 tablet by mouth daily.    Marland Kitchen BREO ELLIPTA 100-25 MCG/INH AEPB Inhale 1 Inhaler into the lungs daily.  11  . carvedilol (COREG) 25 MG tablet Take 1 tablet by mouth 2 (two) times daily.    . cloNIDine (CATAPRES) 0.1 MG tablet Take 0.1 mg by mouth at bedtime.  1  . diclofenac sodium (VOLTAREN) 1 % GEL Apply 1 application topically as needed.    . fluticasone (FLONASE) 50 MCG/ACT nasal spray Place 1 spray into the nose daily.    Marland Kitchen glucose blood test strip Use as instructed 100 each 12  . JANUVIA 100 MG tablet Take 100 mg by mouth daily.  1  . loratadine (CLARITIN) 10 MG tablet TAKE 1 TABLET  EVERY DAY 30 tablet 5  . metFORMIN (GLUCOPHAGE) 850 MG tablet Take 850 mg by mouth 2 (two) times daily.  1  . Multiple Vitamin (MULTI-VITAMINS) TABS Take 1 tablet by mouth daily.    . pravastatin (PRAVACHOL) 40 MG tablet Take 40 mg by mouth daily.  1  . timolol (TIMOPTIC) 0.5 % ophthalmic solution Place 1 drop into both eyes 2 (two) times daily.  5  . traMADol (ULTRAM) 50 MG tablet Take 1 tablet (50 mg total) by mouth every 6 (six) hours as needed. 60 tablet 0  . travoprost, benzalkonium, (TRAVATAN) 0.004 % ophthalmic solution Apply 1 drop to eye every evening.    Marland Kitchen TRIBENZOR 40-10-25 MG TABS Take 1 tablet by mouth daily.  1  . Vitamin D, Ergocalciferol, (DRISDOL) 50000 UNITS CAPS capsule TAKE ONE CAPSULE BY MOUTH ONCE A WEEK 12 capsule 0   No current facility-administered medications on file prior to visit.     Past Medical History  Diagnosis Date  . Generalized osteoarthritis   . Insomnia   . Hyperlipidemia   . Hypertension   . Diabetes mellitus without complication ([place])   . Proteinuria   . Obesity   . Lipoma   . Rotator cuff tear   . Carpal tunnel syndrome   . Tinnitus of both ears   . Wedge compression fracture of t11-T12 vertebra, sequela   . Allergy   . Bunion   . Gout   .  Osteoporosis   . Neuritis or radiculitis due to rupture of lumbar intervertebral disc   . Hemorrhoids without complication   . Lung nodule   . Vitamin D deficiency   . Impingement syndrome of right shoulder   . Unspecified glaucoma   . Cataracta   . Heart murmur   . Mini stroke [facility])      Past Surgical History  Procedure Laterality Date  . Fracture surgery  2001    left femur  . Polyp removed from vocal cord    . Carpal tunnel release    . Appendectomy    . Abdominal hysterectomy       Family History  Problem Relation Age of Onset  . Diabetes Mother   . Hypertension Mother   . Dementia Mother   . Hypertension Son      Social History   Social History  . Marital Status:  Divorced    Spouse Name: N/A  . Number of Children: N/A  . Years of Education: N/A   Occupational History  . Not on file.   Social History Main Topics  . Smoking status: Never Smoker   . Smokeless tobacco: Never Used  . Alcohol Use: No  . Drug Use: No  . Sexual Activity: Not Currently   Other Topics Concern  . Not on file   Social History Narrative     ROS A 10 point review of system was performed. It is negative other than that mentioned in the history of present illness.   PHYSICAL EXAM   BP 130/68 mmHg  Pulse 77  Ht 5\' 6"  (1.676 m)  Wt 219 lb 8 oz (99.565 kg)  BMI 35.45 kg/m2 Constitutional: She is oriented to person, place, and time. She appears well-developed and well-nourished. No distress.  HENT: No nasal discharge.  Head: Normocephalic and atraumatic.  Eyes: Pupils are equal and round. No discharge.  Neck: Normal range of motion. Neck supple. No JVD present. No thyromegaly present.  Cardiovascular: Normal rate, regular rhythm, normal heart sounds. Exam reveals no gallop and no friction rub. There is a 2/6 systolic ejection murmur in the aortic area which is early peaking with preserved S2.  Pulmonary/Chest: Effort normal and breath sounds normal. No stridor. No respiratory distress. She has no wheezes. She has no rales. She exhibits no tenderness.  Abdominal: Soft. Bowel sounds are normal. She exhibits no distension. There is no tenderness. There is no rebound and no guarding.  Musculoskeletal: Normal range of motion. She exhibits no edema and no tenderness.  Neurological: She is alert and oriented to person, place, and time. Coordination normal.  Skin: Skin is warm and dry. No rash noted. She is not diaphoretic. No erythema. No pallor.  Psychiatric: She has a normal mood and affect. Her behavior is normal. Judgment and thought content normal.     [id number] sinus rhythm with sinus arrhythmia.   ASSESSMENT AND PLAN

## 2015-09-26 NOTE — Assessment & Plan Note (Signed)
Her cardiac murmur is due to mild aortic stenosis. I will plan on following her on a yearly basis to ensure stability. Obtain an echocardiogram in 2 years.

## 2015-09-26 NOTE — Patient Instructions (Addendum)
Medication Instructions:  Your physician recommends that you continue on your current medications as directed. Please refer to the Current Medication list given to you today.   Labwork: none  Testing/Procedures: Your physician has requested that you have a lexiscan myoview. For further information please visit [email or web address]. Please follow instruction sheet, as given.  [place]  Your caregiver has ordered a Stress Test with nuclear imaging. The purpose of this test is to evaluate the blood supply to your heart muscle. This procedure is referred to as a "Non-Invasive Stress Test." This is because other than having an IV started in your vein, nothing is inserted or "invades" your body. Cardiac stress tests are done to find areas of poor blood flow to the heart by determining the extent of coronary artery disease (CAD). Some patients exercise on a treadmill, which naturally increases the blood flow to your heart, while others who are  unable to walk on a treadmill due to physical limitations have a pharmacologic/chemical stress agent called Lexiscan . This medicine will mimic walking on a treadmill by temporarily increasing your coronary blood flow.   Please note: these test may take anywhere between 2-4 hours to complete  PLEASE REPORT TO [place] TO GO  Date of Procedure: Thursday, December 1, 9:00am Arrival Time for Procedure: 8:45am  Instructions regarding medication:   __xx__ : Hold diabetes medication morning of procedure. Hold metformin 24 hours before and 48 hours after procedure. Hold Januvia 24 hours before procedure   You may take other medications with a sip of water.    PLEASE NOTIFY THE OFFICE AT LEAST 23 HOURS IN ADVANCE IF YOU ARE UNABLE TO KEEP YOUR APPOINTMENT.  [phone] AND  PLEASE NOTIFY NUCLEAR MEDICINE AT [facility] AT LEAST 24 HOURS IN ADVANCE IF YOU ARE UNABLE TO KEEP YOUR  APPOINTMENT. [phone]  How to prepare for your Myoview test:   Do not eat or drink after midnight  No caffeine for 24 hours prior to test  No smoking 24 hours prior to test.  Your medication may be taken with water.  If your doctor stopped a medication because of this test, do not take that medication.  Ladies, please do not wear dresses.  Skirts or pants are appropriate. Please wear a short sleeve shirt.  No perfume, cologne or lotion.  Wear comfortable walking shoes. No heels!            Follow-Up: Your physician wants you to follow-up in: one year with Dr. Fletcher Anon.  You will receive a reminder letter in the mail two months in advance. If you don't receive a letter, please call our office to schedule the follow-up appointment.   Any Other Special Instructions Will Be Listed Below (If Applicable).     If you need a refill on your cardiac medications before your next appointment, please call your pharmacy.  Cardiac Nuclear Scanning A cardiac nuclear scan is used to check your heart for problems, such as the following:  A portion of the heart is not getting enough blood.  Part of the heart muscle has died, which happens with a heart attack.  The heart wall is not working normally.  In this test, a radioactive dye (tracer) is injected into your bloodstream. After the tracer has traveled to your heart, a scanning device is used to measure how much of the tracer is absorbed by or distributed to various areas of your heart. LET  YOUR HEALTH CARE PROVIDER KNOW ABOUT:  Any allergies you have.  All medicines you are taking, including vitamins, herbs, eye drops, creams, and over-the-counter medicines.  Previous problems you or members of your family have had with the use of anesthetics.  Any blood disorders you have.  Previous surgeries you have had.  Medical conditions you have.  RISKS AND COMPLICATIONS Generally, this is a safe procedure. However, as with any  procedure, problems can occur. Possible problems include:   Serious chest pain.  Rapid heartbeat.  Sensation of warmth in your chest. This usually passes quickly. BEFORE THE PROCEDURE Ask your health care provider about changing or stopping your regular medicines. PROCEDURE This procedure is usually done at a hospital and takes 2-4 hours.  An IV tube is inserted into one of your veins.  Your health care provider will inject a small amount of radioactive tracer through the tube.  You will then wait for 20-40 minutes while the tracer travels through your bloodstream.  You will lie down on an exam table so images of your heart can be taken. Images will be taken for about 15-20 minutes.  You will exercise on a treadmill or stationary bike. While you exercise, your heart activity will be monitored with an electrocardiogram (ECG), and your blood pressure will be checked.  If you are unable to exercise, you may be given a medicine to make your heart beat faster.  When blood flow to your heart has peaked, tracer will again be injected through the IV tube.  After 20-40 minutes, you will get back on the exam table and have more images taken of your heart.  When the procedure is over, your IV tube will be removed. AFTER THE PROCEDURE  You will likely be able to leave shortly after the test. Unless your health care provider tells you otherwise, you may return to your normal schedule, including diet, activities, and medicines.  Make sure you find out how and when you will get your test results.   This information is not intended to replace advice given to you by your health care provider. Make sure you discuss any questions you have with your health care provider.   Document Released: 11/15/2004 Document Revised: 10/26/2013 Document Reviewed: 09/29/2013 Elsevier Interactive Patient Education [employer].

## 2015-10-02 ENCOUNTER — Telehealth: Payer: Self-pay | Admitting: *Deleted

## 2015-10-02 DIAGNOSIS — H903 Sensorineural hearing loss, bilateral: Secondary | ICD-10-CM | POA: Diagnosis not present

## 2015-10-02 DIAGNOSIS — R490 Dysphonia: Secondary | ICD-10-CM | POA: Diagnosis not present

## 2015-10-02 NOTE — Telephone Encounter (Signed)
Returned phone call from pt daughter, Vaughan Basta. No answer, no VM set up. Will CB

## 2015-10-02 NOTE — Telephone Encounter (Signed)
Stopped Metformin  For 24 hours  a stress test and her sugars are running high. Please call patient's daughter Vaughan Basta.

## 2015-10-03 NOTE — Telephone Encounter (Signed)
S/w pt daughter, Vaughan Basta, who has concerns of holding metformin 24 hours before and 48 hours after lexi. She states pt reports blood sugars in 200s. Advised daughter to have pt significantly reduce carbs and sugar intake to help improve blood sugar and to take metformin and januvia as scheduled until Wednesday morning as test scheduled for Thursday. Vaughan Basta verbalized understanding and will inform pt.

## 2015-10-04 ENCOUNTER — Telehealth: Payer: Self-pay

## 2015-10-04 NOTE — Telephone Encounter (Signed)
S/w pt daughter, Vaughan Basta, to review lexi myoview instructions. Vaughan Basta verbalized understanding and states she will double check w/pt to ensure she did not take metformin or [place] today. She will call back for further instructions if pt did take the medication.

## 2015-10-05 ENCOUNTER — Encounter
Admission: RE | Admit: 2015-10-05 | Discharge: 2015-10-05 | Disposition: A | Discharge status: Home / Self Care | Payer: Medicare Other | Source: Ambulatory Visit | Attending: Cardiovascular Disease | Admitting: Cardiovascular Disease

## 2015-10-05 DIAGNOSIS — R079 Chest pain, unspecified: Secondary | ICD-10-CM | POA: Insufficient documentation

## 2015-10-05 LAB — NM MYOCAR MULTI W/SPECT W/WALL MOTION / EF
CHL CUP NUCLEAR SDS: 3
CHL CUP NUCLEAR SRS: 6
CHL CUP NUCLEAR SSS: 3
CHL CUP RESTING HR STRESS: 72 {beats}/min
LV dias vol: 84 mL
LV sys vol: 47 mL
Peak HR: 100 {beats}/min
Percent HR: 71 %
TID: 1.3

## 2015-10-05 MED ORDER — TECHNETIUM TC 99M SESTAMIBI - CARDIOLITE
13.9300 | Freq: Once | INTRAVENOUS | Status: AC | PRN
  Administered 2015-10-05: 09:00:00 13.93 via INTRAVENOUS

## 2015-10-05 MED ORDER — REGADENOSON 0.4 MG/5ML IV SOLN
0.4000 mg | Freq: Once | INTRAVENOUS | Status: AC
  Administered 2015-10-05: 0.4 mg via INTRAVENOUS

## 2015-10-05 MED ORDER — TECHNETIUM TC 99M SESTAMIBI - CARDIOLITE
31.8300 | Freq: Once | INTRAVENOUS | Status: AC | PRN
  Administered 2015-10-05: 31.83 via INTRAVENOUS

## 2015-10-10 DIAGNOSIS — E0520 Thyrotoxicosis with toxic multinodular goiter without thyrotoxic crisis or storm: Secondary | ICD-10-CM | POA: Diagnosis not present

## 2015-10-17 DIAGNOSIS — J069 Acute upper respiratory infection, unspecified: Secondary | ICD-10-CM | POA: Diagnosis not present

## 2015-10-17 DIAGNOSIS — E0520 Thyrotoxicosis with toxic multinodular goiter without thyrotoxic crisis or storm: Secondary | ICD-10-CM | POA: Diagnosis not present

## 2015-10-31 DIAGNOSIS — E0520 Thyrotoxicosis with toxic multinodular goiter without thyrotoxic crisis or storm: Secondary | ICD-10-CM | POA: Diagnosis not present

## 2015-11-25 ENCOUNTER — Other Ambulatory Visit: Payer: Self-pay | Admitting: Family Medicine

## 2015-11-27 DIAGNOSIS — E559 Vitamin D deficiency, unspecified: Secondary | ICD-10-CM | POA: Diagnosis not present

## 2015-11-27 DIAGNOSIS — I10 Essential (primary) hypertension: Secondary | ICD-10-CM | POA: Diagnosis not present

## 2015-11-27 DIAGNOSIS — D72829 Elevated white blood cell count, unspecified: Secondary | ICD-10-CM | POA: Diagnosis not present

## 2015-11-27 DIAGNOSIS — E1121 Type 2 diabetes mellitus with diabetic nephropathy: Secondary | ICD-10-CM | POA: Diagnosis not present

## 2015-11-27 DIAGNOSIS — E785 Hyperlipidemia, unspecified: Secondary | ICD-10-CM | POA: Diagnosis not present

## 2015-11-28 LAB — CBC WITH DIFFERENTIAL/PLATELET
Basophils Absolute: 0 10*3/uL (ref 0.0–0.2)
Basos: 0 %
EOS (ABSOLUTE): 0.4 10*3/uL (ref 0.0–0.4)
Eos: 4 %
HEMOGLOBIN: 11.1 g/dL (ref 11.1–15.9)
Hematocrit: 35 % (ref 34.0–46.6)
IMMATURE GRANS (ABS): 0 10*3/uL (ref 0.0–0.1)
IMMATURE GRANULOCYTES: 0 %
LYMPHS: 31 %
Lymphocytes Absolute: 3 10*3/uL (ref 0.7–3.1)
MCH: 25.3 pg — ABNORMAL LOW (ref 26.6–33.0)
MCHC: 31.7 g/dL (ref 31.5–35.7)
MCV: 80 fL (ref 79–97)
MONOCYTES: 6 %
Monocytes Absolute: 0.6 10*3/uL (ref 0.1–0.9)
NEUTROS ABS: 5.6 10*3/uL (ref 1.4–7.0)
Neutrophils: 59 %
Platelets: 324 10*3/uL (ref 150–379)
RBC: 4.38 x10E6/uL (ref 3.77–5.28)
RDW: 16.3 % — ABNORMAL HIGH (ref 12.3–15.4)
WBC: 9.5 10*3/uL (ref 3.4–10.8)

## 2015-11-28 LAB — COMPREHENSIVE METABOLIC PANEL
A/G RATIO: 1.3 (ref 1.1–2.5)
ALT: 9 IU/L (ref 0–32)
AST: 13 IU/L (ref 0–40)
Albumin: 3.8 g/dL (ref 3.5–4.7)
Alkaline Phosphatase: 65 IU/L (ref 39–117)
BUN/Creatinine Ratio: 13 (ref 11–26)
BUN: 12 mg/dL (ref 8–27)
Bilirubin Total: 0.4 mg/dL (ref 0.0–1.2)
CALCIUM: 9.1 mg/dL (ref 8.7–10.3)
CO2: 24 mmol/L (ref 18–29)
Chloride: 101 mmol/L (ref 96–106)
Creatinine, Ser: 0.95 mg/dL (ref 0.57–1.00)
GFR, EST AFRICAN AMERICAN: 65 mL/min/{1.73_m2} (ref >59)
GFR, EST NON AFRICAN AMERICAN: 56 mL/min/{1.73_m2} — AB (ref >59)
Globulin, Total: 2.9 g/dL (ref 1.5–4.5)
Glucose: 216 mg/dL — ABNORMAL HIGH (ref 65–99)
POTASSIUM: 4.2 mmol/L (ref 3.5–5.2)
Sodium: 141 mmol/L (ref 134–144)
TOTAL PROTEIN: 6.7 g/dL (ref 6.0–8.5)

## 2015-11-28 LAB — HEMOGLOBIN A1C
Est. average glucose Bld gHb Est-mCnc: 255 mg/dL
Hgb A1c MFr Bld: 10.5 % — ABNORMAL HIGH (ref 4.8–5.6)

## 2015-11-28 LAB — LIPID PANEL
CHOL/HDL RATIO: 2.8 ratio (ref 0.0–4.4)
Cholesterol, Total: 169 mg/dL (ref 100–199)
HDL: 61 mg/dL (ref >39)
LDL Calculated: 87 mg/dL (ref 0–99)
TRIGLYCERIDES: 107 mg/dL (ref 0–149)
VLDL Cholesterol Cal: 21 mg/dL (ref 5–40)

## 2015-11-28 LAB — VITAMIN D 25 HYDROXY (VIT D DEFICIENCY, FRACTURES): Vit D, 25-Hydroxy: 31.5 ng/mL (ref 30.0–100.0)

## 2015-12-07 ENCOUNTER — Telehealth: Payer: Self-pay

## 2015-12-07 NOTE — Telephone Encounter (Signed)
Patient called stating that she has been having some g.i. problems and it is affecting her diabetes. She was informed that there is a virus going around and that it has to run its course. She was encouraged to try a bland diet (nothing fried, spicy or greasy) and to increase her fluid intake. She was told she can try Gatorade mixed with water, Pedialyte, or any clear fluids. Patient was informed that I will send this message to Dr. Ancil Boozer but was strongly encourage to call 911 if her sx worsens.   Blood sugar was 449 last night and this morning it was 200.  She ate some chicken noodle soup, saltines and ginger ale.

## 2015-12-07 NOTE — Telephone Encounter (Signed)
Tried to contact this patient to relay Dr. Ancil Boozer message, but there was no answer and I was not able to leave a message.

## 2015-12-07 NOTE — Telephone Encounter (Signed)
She needs to drink mostly water, avoid sweet beverages.  She may need to come in a consider short term use of insulin to keep glucose down and make sure it is just a viral illness

## 2015-12-07 NOTE — Telephone Encounter (Signed)
Tried to contact this patient again, but I got her daughter. She stated that her mom felt a little better and was able to keep a little food down, but was still running off. She was informed of Dr. Ancil Boozer message and was encouraged to come in for a visit on Monday if her sx persists, but she stated she already had an appt on Monday with another doctor for something else. I encouraged her to take her mom to the nearest ED if her sx worsened over the wknd and she said ok.

## 2015-12-08 DIAGNOSIS — R197 Diarrhea, unspecified: Secondary | ICD-10-CM | POA: Diagnosis not present

## 2015-12-11 ENCOUNTER — Encounter: Payer: Self-pay | Admitting: Family Medicine

## 2015-12-11 ENCOUNTER — Ambulatory Visit (INDEPENDENT_AMBULATORY_CARE_PROVIDER_SITE_OTHER): Payer: Medicare Other | Admitting: Family Medicine

## 2015-12-11 VITALS — BP 124/68 | HR 87 | Temp 98.5°F | Resp 16 | Ht 66.0 in | Wt 220.6 lb

## 2015-12-11 DIAGNOSIS — E785 Hyperlipidemia, unspecified: Secondary | ICD-10-CM | POA: Diagnosis not present

## 2015-12-11 DIAGNOSIS — I10 Essential (primary) hypertension: Secondary | ICD-10-CM | POA: Diagnosis not present

## 2015-12-11 DIAGNOSIS — E042 Nontoxic multinodular goiter: Secondary | ICD-10-CM

## 2015-12-11 DIAGNOSIS — M109 Gout, unspecified: Secondary | ICD-10-CM | POA: Diagnosis not present

## 2015-12-11 DIAGNOSIS — E559 Vitamin D deficiency, unspecified: Secondary | ICD-10-CM | POA: Diagnosis not present

## 2015-12-11 DIAGNOSIS — M722 Plantar fascial fibromatosis: Secondary | ICD-10-CM | POA: Diagnosis not present

## 2015-12-11 DIAGNOSIS — B001 Herpesviral vesicular dermatitis: Secondary | ICD-10-CM | POA: Diagnosis not present

## 2015-12-11 DIAGNOSIS — K529 Noninfective gastroenteritis and colitis, unspecified: Secondary | ICD-10-CM

## 2015-12-11 DIAGNOSIS — E1121 Type 2 diabetes mellitus with diabetic nephropathy: Secondary | ICD-10-CM

## 2015-12-11 MED ORDER — VALACYCLOVIR HCL 1 G PO TABS: 1000.0000 mg | ORAL_TABLET | Freq: Two times a day (BID) | ORAL | Status: DC | PRN

## 2015-12-11 MED ORDER — FIRST-DUKES MOUTHWASH MT SUSP: 15.0000 mL | Freq: Three times a day (TID) | OROMUCOSAL | Status: DC

## 2015-12-11 MED ORDER — TRIBENZOR 40-10-25 MG PO TABS: 1.0000 | ORAL_TABLET | Freq: Every day | ORAL | Status: DC

## 2015-12-11 MED ORDER — METFORMIN HCL 850 MG PO TABS: 850.0000 mg | ORAL_TABLET | Freq: Two times a day (BID) | ORAL | Status: DC

## 2015-12-11 MED ORDER — EMPAGLIFLOZIN 25 MG PO TABS: 25.0000 mg | ORAL_TABLET | Freq: Every day | ORAL | Status: DC

## 2015-12-11 MED ORDER — PRAVASTATIN SODIUM 40 MG PO TABS: 40.0000 mg | ORAL_TABLET | Freq: Every day | ORAL | Status: DC

## 2015-12-11 MED ORDER — AMLODIPINE-OLMESARTAN 10-40 MG PO TABS: 1.0000 | ORAL_TABLET | Freq: Every day | ORAL | Status: DC

## 2015-12-11 MED ORDER — FLUCONAZOLE 150 MG PO TABS: 150.0000 mg | ORAL_TABLET | ORAL | Status: DC

## 2015-12-11 NOTE — Progress Notes (Signed)
Name: Susan Bowen   MRN: [id number]    DOB: 12-16-1931   Date:12/11/2015       Progress Note  Subjective  Chief Complaint  Chief Complaint  Patient presents with  . Allergic Reaction    lips swelling, lesions inside of mouth for 3 days  . Follow-up    food poisoning seen at [facility]  . Foot Injury    heel pain    HPI  DMII with microalbuminuria; she is taking medications and also aspirin, ARB and statin therapy. FSBS at home has been 180's . No polyphagia, polydipsia or polyuria. Her hgbA1C has gone very high. She states she cannot inject herself with insulin, but is willing to try another oral medication. She has gone to Diabetes education years ago, but reviewing her diet log she still eats a lot of carbohydrates.   HTN: she has been taking medication, dizziness resolved. No chest pain but has decrease in exercise tolerance  Hyperlipidemia: taking Pravastatin and denies side effects of medications, including no myalgia.   Plantar fascitis: she has noticed left heel pain when she first steps down or with pressure on left inner plantar area and heel. No redness or swelling. Symptoms are going on for over 6 months and would like to see a Podiatrist  Pulmonary nodule/SOB: seen by Dr. Raul Del, lung nodule stable and has yearly follow ups She had an echo done that showed Tricuspid regurgitation and LVH, she will be referred to Dr. Clayborn Bigness. Denies chest pain at this time. SOB has improved with weight loss  Goiter: s/p thyroid ablation, Summer 2016. Seeing Dr. Gabriel Carina, not on medication.   Gout: she was doing well, but had a flare around Christmas, pain on left first toe, it was also red and swollen, symptoms resolved by itself. Advised stopping HCTZ from [place] and she is willing to stop it.   Gastroenteritis: she was very sick last week with nausea and vomiting for 24 hours followed by diarrhea for one more day. [facility] and was given C[facility], symptoms resolved, but for the past  two days she has swollen lips and painful blisters on her lips  ( she has a history of fever blister)     Patient Active Problem List   Diagnosis Date Noted  . Atypical chest pain 09/26/2015  . Aortic stenosis 09/26/2015  . Allergic rhinitis, seasonal 08/08/2015  . Benign hypertension 08/08/2015  . History of pneumonia 08/08/2015  . Carpal tunnel syndrome 08/08/2015  . Cataract 08/08/2015  . Insomnia, persistent 08/08/2015  . Dyslipidemia 08/08/2015  . History of depression 08/08/2015  . Glaucoma 08/08/2015  . Controlled gout 08/08/2015  . Fever blister 08/08/2015  . Bilateral hearing loss 08/08/2015  . Hemorrhoid 08/08/2015  . H/O iron deficiency anemia 08/08/2015  . Benign neoplasm of colon 08/08/2015  . Impingement syndrome of shoulder 08/08/2015  . Osteoporosis, post-menopausal 08/08/2015  . Adult BMI 30+ 08/08/2015  . Generalized OA 08/08/2015  . Multinodular goiter 08/08/2015  . Abnormal presence of protein in urine 08/08/2015  . Asymptomatic varicose veins 08/08/2015  . Polyp of vocal cord 08/08/2015  . Wedge fracture of thoracic vertebra ([place]) 08/08/2015  . Diabetes mellitus with proteinuric diabetic nephropathy ([place]) 08/08/2015  . LVH (left ventricular hypertrophy) 08/08/2015  . Moderate tricuspid regurgitation 08/08/2015  . History of radioactive iodine thyroid ablation 08/08/2015  . Lung nodule, solitary 05/12/2014  . Chronic cough 05/11/2014  . Elevated WBC count 05/11/2014  . Vitamin D deficiency 12/20/2009  . Plantar  fascial fibromatosis 12/20/2009  . Personal history of fall 07/20/2008  . Cervical radiculitis 05/11/2008    Past Surgical History  Procedure Laterality Date  . Fracture surgery  2001    left femur  . Polyp removed from vocal cord    . Carpal tunnel release    . Appendectomy    . Abdominal hysterectomy      Family History  Problem Relation Age of Onset  . Diabetes Mother   . Hypertension Mother   . Dementia Mother   . Hypertension  Son     Social History   Social History  . Marital Status: Divorced    Spouse Name: N/A  . Number of Children: N/A  . Years of Education: N/A   Occupational History  . Not on file.   Social History Main Topics  . Smoking status: Never Smoker   . Smokeless tobacco: Never Used  . Alcohol Use: No  . Drug Use: No  . Sexual Activity: Not Currently   Other Topics Concern  . Not on file   Social History Narrative     Current outpatient prescriptions:  .  aspirin (ASPIRIN LOW DOSE) 81 MG chewable tablet, Chew 1 tablet by mouth daily., Disp: , Rfl:  .  BREO ELLIPTA 100-25 MCG/INH AEPB, Inhale 1 Inhaler into the lungs daily., Disp: , Rfl: 11 .  carvedilol (COREG) 25 MG tablet, TAKE 1 TABLET BY MOUTH TWICE A DAY, Disp: 180 tablet, Rfl: 1 .  cloNIDine (CATAPRES) 0.1 MG tablet, TAKE 1 TABLET BY MOUTH AT BEDTIME, Disp: 90 tablet, Rfl: 1 .  diclofenac sodium (VOLTAREN) 1 % GEL, Apply 1 application topically as needed., Disp: , Rfl:  .  Diphenhyd-Hydrocort-Nystatin (FIRST-DUKES MOUTHWASH) SUSP, Use as directed 15 mLs in the mouth or throat 4 (four) times daily -  before meals and at bedtime., Disp: 237 mL, Rfl: 0 .  empagliflozin (JARDIANCE) 25 MG TABS tablet, Take 25 mg by mouth daily., Disp: 90 tablet, Rfl: 1 .  fluconazole (DIFLUCAN) 150 MG tablet, Take 1 tablet (150 mg total) by mouth every other day. Prn, Disp: 9 tablet, Rfl: 0 .  fluticasone (FLONASE) 50 MCG/ACT nasal spray, Place 1 spray into the nose daily., Disp: , Rfl:  .  glucose blood test strip, Use as instructed, Disp: 100 each, Rfl: 12 .  JANUVIA 100 MG tablet, TAKE 1 TABLET BY MOUTH DAILY, Disp: 90 tablet, Rfl: 1 .  loratadine (CLARITIN) 10 MG tablet, TAKE 1 TABLET EVERY DAY, Disp: 30 tablet, Rfl: 5 .  metFORMIN (GLUCOPHAGE) 850 MG tablet, Take 1 tablet (850 mg total) by mouth 2 (two) times daily., Disp: 180 tablet, Rfl: 1 .  Multiple Vitamin (MULTI-VITAMINS) TABS, Take 1 tablet by mouth daily., Disp: , Rfl:  .  pravastatin  (PRAVACHOL) 40 MG tablet, Take 1 tablet (40 mg total) by mouth daily., Disp: 30 tablet, Rfl: 1 .  timolol (TIMOPTIC) 0.5 % ophthalmic solution, Place 1 drop into both eyes 2 (two) times daily., Disp: , Rfl: 5 .  traMADol (ULTRAM) 50 MG tablet, Take 1 tablet (50 mg total) by mouth every 6 (six) hours as needed., Disp: 60 tablet, Rfl: 0 .  travoprost, benzalkonium, (TRAVATAN) 0.004 % ophthalmic solution, Apply 1 drop to eye every evening., Disp: , Rfl:  .  TRIBENZOR 40-10-25 MG TABS, Take 1 tablet by mouth daily., Disp: 30 tablet, Rfl: 1 .  valACYclovir (VALTREX) 1000 MG tablet, Take 1 tablet (1,000 mg total) by mouth 2 (two) times daily as needed., Disp:  10 tablet, Rfl: 0 .  Vitamin D, Ergocalciferol, (DRISDOL) 50000 UNITS CAPS capsule, TAKE ONE CAPSULE BY MOUTH ONCE A WEEK, Disp: 12 capsule, Rfl: 0  Allergies  Allergen Reactions  . Ace Inhibitors   . Atorvastatin   . Augmentin [Amoxicillin-Pot Clavulanate] Itching  . Cyclobenzaprine Hcl     pt cannot take because she has gluacoma  . Nsaids   . Rosuvastatin      ROS  Constitutional: Negative for fever or weight change.  Respiratory: Positive for cough ( improved ) and mild shortness of breath.   Cardiovascular: Negative for chest pain or palpitations.  Gastrointestinal: Negative for abdominal pain, no bowel changes.  Musculoskeletal: Positive  for gait problem ( secondary to left foot pain ) no  joint swelling.  Skin: Positive for rash on lips Neurological: Negative for dizziness or headache.  No other specific complaints in a complete review of systems (except as listed in HPI above).  Objective  Filed Vitals:   12/11/15 1322  BP: 124/68  Pulse: 87  Temp: 98.5 F (36.9 C)  TempSrc: Oral  Resp: 16  Height: 5\' 6"  (1.676 m)  Weight: 220 lb 9.6 oz (100.064 kg)  SpO2: 94%    Body mass index is 35.62 kg/(m^2).  Physical Exam  Constitutional: Patient appears well-developed and well-nourished. Obese  No distress.  HEENT:  head atraumatic, normocephalic, pupils equal and reactive to light, neck supple, throat within normal limits Cardiovascular: Normal rate, regular rhythm and normal heart sounds.  No murmur heard. No BLE edema. Pulmonary/Chest: Effort normal and breath sounds normal. No respiratory distress. Abdominal: Soft.  There is no tenderness. Psychiatric: Patient has a normal mood and affect. behavior is normal. Judgment and thought content normal. Muscular Skeletal : pain during palpation of left heel.  Skin: lips have clusters of erythematous base with blisters, also a few erythematous spots on soft palate  Recent Results (from the past 2160 hour(s))  NM Myocar Multi W/Spect W/Wall Motion / EF     Status: None   Collection Time: 10/05/15 11:30 AM  Result Value Ref Range   Rest HR 72 bpm   Rest BP 143/69 mmHg   Exercise duration (min)  min   Exercise duration (sec)  sec   Estimated workload  METS   Peak HR 100 bpm   Peak BP 125/73 mmHg   MPHR  bpm   Percent HR 71 %   RPE     LV Systolic Volume 47 mL   TID 123456    LV Diastolic Volume 84 mL   LHR     SSS 3    SRS 6    SDS 3   Lipid panel     Status: None   Collection Time: 11/27/15  9:29 AM  Result Value Ref Range   Cholesterol, Total 169 100 - 199 mg/dL   Triglycerides 107 0 - 149 mg/dL   HDL 61 >39 mg/dL   VLDL Cholesterol Cal 21 5 - 40 mg/dL   LDL Calculated 87 0 - 99 mg/dL   Chol/HDL Ratio 2.8 0.0 - 4.4 ratio units    Comment:                                   T. Chol/HDL Ratio  Men  Women                               1/2 Avg.Risk  3.4    3.3                                   Avg.Risk  5.0    4.4                                2X Avg.Risk  9.6    7.1                                3X Avg.Risk 23.4   11.0   Comprehensive metabolic panel     Status: Abnormal   Collection Time: 11/27/15  9:29 AM  Result Value Ref Range   Glucose 216 (H) 65 - 99 mg/dL   BUN 12 8 - 27 mg/dL    Creatinine, Ser 0.95 0.57 - 1.00 mg/dL   GFR calc non Af Amer 56 (L) >59 mL/min/1.73   GFR calc Af Amer 65 >59 mL/min/1.73   BUN/Creatinine Ratio 13 11 - 26   Sodium 141 134 - 144 mmol/L   Potassium 4.2 3.5 - 5.2 mmol/L   Chloride 101 96 - 106 mmol/L   CO2 24 18 - 29 mmol/L   Calcium 9.1 8.7 - 10.3 mg/dL   Total Protein 6.7 6.0 - 8.5 g/dL   Albumin 3.8 3.5 - 4.7 g/dL   Globulin, Total 2.9 1.5 - 4.5 g/dL   Albumin/Globulin Ratio 1.3 1.1 - 2.5   Bilirubin Total 0.4 0.0 - 1.2 mg/dL   Alkaline Phosphatase 65 39 - 117 IU/L   AST 13 0 - 40 IU/L   ALT 9 0 - 32 IU/L  Hemoglobin A1c     Status: Abnormal   Collection Time: 11/27/15  9:29 AM  Result Value Ref Range   Hgb A1c MFr Bld 10.5 (H) 4.8 - 5.6 %    Comment:          Pre-diabetes: 5.7 - 6.4          Diabetes: >6.4          Glycemic control for adults with diabetes: <7.0    Est. average glucose Bld gHb Est-mCnc 255 mg/dL  Vit D  25 hydroxy (rtn osteoporosis monitoring)     Status: None   Collection Time: 11/27/15  9:29 AM  Result Value Ref Range   Vit D, 25-Hydroxy 31.5 30.0 - 100.0 ng/mL    Comment: Vitamin D deficiency has been defined by the Institute of Medicine and an Endocrine Society practice guideline as a level of serum 25-OH vitamin D less than 20 ng/mL (1,2). The Endocrine Society went on to further define vitamin D insufficiency as a level between 21 and 29 ng/mL (2). 1. IOM (Institute of Medicine). 2010. Dietary reference    intakes for calcium and D. [place]: The    [employer]. 2. Holick MF, Binkley [place], Bischoff-Ferrari HA, et al.    Evaluation, treatment, and prevention of vitamin D    deficiency: an Endocrine Society clinical practice    guideline. JCEM. 2011 Jul; 96(7):1911-30.   CBC with Differential/Platelet     Status: Abnormal  Collection Time: 11/27/15  9:29 AM  Result Value Ref Range   WBC 9.5 3.4 - 10.8 x10E3/uL   RBC 4.38 3.77 - 5.28 x10E6/uL   Hemoglobin 11.1 11.1 - 15.9 g/dL    Hematocrit 35.0 34.0 - 46.6 %   MCV 80 79 - 97 fL   MCH 25.3 (L) 26.6 - 33.0 pg   MCHC 31.7 31.5 - 35.7 g/dL   RDW 16.3 (H) 12.3 - 15.4 %   Platelets 324 150 - 379 x10E3/uL   Neutrophils 59 %   Lymphs 31 %   Monocytes 6 %   Eos 4 %   Basos 0 %   Neutrophils Absolute 5.6 1.4 - 7.0 x10E3/uL   Lymphocytes Absolute 3.0 0.7 - 3.1 x10E3/uL   Monocytes Absolute 0.6 0.1 - 0.9 x10E3/uL   EOS (ABSOLUTE) 0.4 0.0 - 0.4 x10E3/uL   Basophils Absolute 0.0 0.0 - 0.2 x10E3/uL   Immature Granulocytes 0 %   Immature Grans (Abs) 0.0 0.0 - 0.1 x10E3/uL    PHQ2/9: Depression screen [facility] 2/9 12/11/2015 08/08/2015  Decreased Interest 0 0  Down, Depressed, Hopeless 0 0  PHQ - 2 Score 0 0     Fall Risk: Fall Risk  12/11/2015 11/01/2015 08/08/2015  Falls in the past year? No No No     Functional Status Survey: Is the patient deaf or have difficulty hearing?: No Does the patient have difficulty seeing, even when wearing glasses/contacts?: No Does the patient have difficulty concentrating, remembering, or making decisions?: No Does the patient have difficulty walking or climbing stairs?: No Does the patient have difficulty dressing or bathing?: No Does the patient have difficulty doing errands alone such as visiting a doctor's office or shopping?: No    Assessment & Plan  1. Diabetes mellitus with proteinuric diabetic nephropathy ([place])  She refuses to add insulin, we will try adding Jardiance - metFORMIN (GLUCOPHAGE) 850 MG tablet; Take 1 tablet (850 mg total) by mouth 2 (two) times daily.  Dispense: 180 tablet; Refill: 1 - empagliflozin (JARDIANCE) 25 MG TABS tablet; Take 25 mg by mouth daily.  Dispense: 90 tablet; Refill: 1 - fluconazole (DIFLUCAN) 150 MG tablet; Take 1 tablet (150 mg total) by mouth every other day. Prn  Dispense: 9 tablet; Refill: 0  2. Benign hypertension  We will stop Tribenzor because of gout and try Azor , needs to monitor bp at home once she switches medication  3.  Controlled gout  But with two episodes in the past 2 months  4. Vitamin D deficiency  Continue Vitamin D supplementation   5. Dyslipidemia  - pravastatin (PRAVACHOL) 40 MG tablet; Take 1 tablet (40 mg total) by mouth daily.  Dispense: 30 tablet; Refill: 1  6. Fever blister  - valACYclovir (VALTREX) 1000 MG tablet; Take 1 tablet (1,000 mg total) by mouth 2 (two) times daily as needed.  Dispense: 10 tablet; Refill: 0 - Diphenhyd-Hydrocort-Nystatin (FIRST-DUKES MOUTHWASH) SUSP; Use as directed 15 mLs in the mouth or throat 4 (four) times daily -  before meals and at bedtime.  Dispense: 237 mL; Refill: 0  7. Multinodular goiter  Continue follow up with Dr. Gabriel Carina   8. Gastroenteritis  resolved  9. Plantar fasciitis, left  - Ambulatory referral to Podiatry

## 2015-12-11 NOTE — Patient Instructions (Signed)
Diabetes Mellitus and Food It is important for you to manage your blood sugar (glucose) level. Your blood glucose level can be greatly affected by what you eat. Eating healthier foods in the appropriate amounts throughout the day at about the same time each day will help you control your blood glucose level. It can also help slow or prevent worsening of your diabetes mellitus. Healthy eating may even help you improve the level of your blood pressure and reach or maintain a healthy weight.  General recommendations for healthful eating and cooking habits include:  Eating meals and snacks regularly. Avoid going long periods of time without eating to lose weight.  Eating a diet that consists mainly of plant-based foods, such as fruits, vegetables, nuts, legumes, and whole grains.  Using low-heat cooking methods, such as baking, instead of high-heat cooking methods, such as deep frying. Work with your dietitian to make sure you understand how to use the Nutrition Facts information on food labels. HOW CAN FOOD AFFECT ME? Carbohydrates Carbohydrates affect your blood glucose level more than any other type of food. Your dietitian will help you determine how many carbohydrates to eat at each meal and teach you how to count carbohydrates. Counting carbohydrates is important to keep your blood glucose at a healthy level, especially if you are using insulin or taking certain medicines for diabetes mellitus. Alcohol Alcohol can cause sudden decreases in blood glucose (hypoglycemia), especially if you use insulin or take certain medicines for diabetes mellitus. Hypoglycemia can be a life-threatening condition. Symptoms of hypoglycemia (sleepiness, dizziness, and disorientation) are similar to symptoms of having too much alcohol.  If your health care provider has given you approval to drink alcohol, do so in moderation and use the following guidelines:  Women should not have more than one drink per day, and men  should not have more than two drinks per day. One drink is equal to:  12 oz of beer.  5 oz of wine.  1 oz of hard liquor.  Do not drink on an empty stomach.  Keep yourself hydrated. Have water, diet soda, or unsweetened iced tea.  Regular soda, juice, and other mixers might contain a lot of carbohydrates and should be counted. WHAT FOODS ARE NOT RECOMMENDED? As you make food choices, it is important to remember that all foods are not the same. Some foods have fewer nutrients per serving than other foods, even though they might have the same number of calories or carbohydrates. It is difficult to get your body what it needs when you eat foods with fewer nutrients. Examples of foods that you should avoid that are high in calories and carbohydrates but low in nutrients include:  Trans fats (most processed foods list trans fats on the Nutrition Facts label).  Regular soda.  Juice.  Candy.  Sweets, such as cake, pie, doughnuts, and cookies.  Fried foods. WHAT FOODS CAN I EAT? Eat nutrient-rich foods, which will nourish your body and keep you healthy. The food you should eat also will depend on several factors, including:  The calories you need.  The medicines you take.  Your weight.  Your blood glucose level.  Your blood pressure level.  Your cholesterol level. You should eat a variety of foods, including:  Protein.  Lean cuts of meat.  Proteins low in saturated fats, such as fish, egg whites, and beans. Avoid processed meats.  Fruits and vegetables.  Fruits and vegetables that may help control blood glucose levels, such as apples, mangoes, and   yams.  Dairy products.  Choose fat-free or low-fat dairy products, such as milk, yogurt, and cheese.  Grains, bread, pasta, and rice.  Choose whole grain products, such as multigrain bread, whole oats, and brown rice. These foods may help control blood pressure.  Fats.  Foods containing healthful fats, such as nuts,  avocado, olive oil, canola oil, and fish. DOES EVERYONE WITH DIABETES MELLITUS HAVE THE SAME MEAL PLAN? Because every person with diabetes mellitus is different, there is not one meal plan that works for everyone. It is very important that you meet with a dietitian who will help you create a meal plan that is just right for you.   This information is not intended to replace advice given to you by your health care provider. Make sure you discuss any questions you have with your health care provider.   Document Released: 07/18/2005 Document Revised: 11/11/2014 Document Reviewed: 09/17/2013 Elsevier Interactive Patient Education 2016 [employer]  

## 2015-12-21 DIAGNOSIS — R918 Other nonspecific abnormal finding of lung field: Secondary | ICD-10-CM | POA: Diagnosis not present

## 2015-12-21 DIAGNOSIS — R0609 Other forms of dyspnea: Secondary | ICD-10-CM | POA: Diagnosis not present

## 2015-12-21 DIAGNOSIS — E6609 Other obesity due to excess calories: Secondary | ICD-10-CM | POA: Diagnosis not present

## 2015-12-21 DIAGNOSIS — J45909 Unspecified asthma, uncomplicated: Secondary | ICD-10-CM | POA: Diagnosis not present

## 2015-12-22 ENCOUNTER — Other Ambulatory Visit: Payer: Self-pay | Admitting: Family Medicine

## 2015-12-22 NOTE — Telephone Encounter (Signed)
Patient requesting refill. 

## 2016-01-12 ENCOUNTER — Encounter: Payer: Self-pay | Admitting: *Deleted

## 2016-01-16 DIAGNOSIS — E0520 Thyrotoxicosis with toxic multinodular goiter without thyrotoxic crisis or storm: Secondary | ICD-10-CM | POA: Diagnosis not present

## 2016-01-23 ENCOUNTER — Encounter: Payer: Self-pay | Admitting: Family Medicine

## 2016-01-23 DIAGNOSIS — E042 Nontoxic multinodular goiter: Secondary | ICD-10-CM | POA: Diagnosis not present

## 2016-01-23 DIAGNOSIS — E890 Postprocedural hypothyroidism: Secondary | ICD-10-CM | POA: Diagnosis not present

## 2016-01-25 ENCOUNTER — Other Ambulatory Visit: Payer: Self-pay | Admitting: Family Medicine

## 2016-01-25 NOTE — Telephone Encounter (Signed)
Patient requesting refill. 

## 2016-02-07 DIAGNOSIS — H401131 Primary open-angle glaucoma, bilateral, mild stage: Secondary | ICD-10-CM | POA: Diagnosis not present

## 2016-03-01 ENCOUNTER — Other Ambulatory Visit: Payer: Self-pay | Admitting: Family Medicine

## 2016-03-01 NOTE — Telephone Encounter (Signed)
Patient requesting refill. 

## 2016-03-04 DIAGNOSIS — C7B00 Secondary carcinoid tumors, unspecified site: Secondary | ICD-10-CM

## 2016-03-04 HISTORY — DX: Secondary carcinoid tumors, unspecified site: C7B.00

## 2016-03-12 ENCOUNTER — Ambulatory Visit (INDEPENDENT_AMBULATORY_CARE_PROVIDER_SITE_OTHER): Payer: Medicare Other | Admitting: Podiatry

## 2016-03-12 ENCOUNTER — Ambulatory Visit (INDEPENDENT_AMBULATORY_CARE_PROVIDER_SITE_OTHER): Payer: Medicare Other

## 2016-03-12 ENCOUNTER — Encounter: Payer: Self-pay | Admitting: Podiatry

## 2016-03-12 VITALS — BP 151/66 | HR 81 | Resp 18

## 2016-03-12 DIAGNOSIS — R52 Pain, unspecified: Secondary | ICD-10-CM | POA: Diagnosis not present

## 2016-03-12 DIAGNOSIS — M722 Plantar fascial fibromatosis: Secondary | ICD-10-CM | POA: Insufficient documentation

## 2016-03-12 NOTE — Progress Notes (Signed)
   Subjective:    Patient ID: Susan Bowen, female    DOB: [birth year], 82 y.o.   MRN: [id number]  HPI  82 year old female presents the office of consent left heel pain which is been ongoing since February of this year. She has internal Rollo frozen water bottle under her foot how this has not been helping. She's had no previous injury. No swelling or redness. No tingling or numbness. No other treatment. No other complaints at this time.  Last a1c 10.5 but she states it has been under much better control is the low 100's.  Review of Systems  All other systems reviewed and are negative.      Objective:   Physical Exam General: AAO x3, NAD  Dermatological: Skin is warm, dry and supple bilateral. Nails x 10 are well manicured; remaining integument appears unremarkable at this time. There are no open sores, no preulcerative lesions, no rash or signs of infection present.  Vascular: Dorsalis Pedis artery and Posterior Tibial artery pedal pulses are 2/4 bilateral with immedate capillary fill time. Pedal hair growth present. No varicosities and no lower extremity edema present bilateral. There is no pain with calf compression, swelling, warmth, erythema.   Neruologic: Grossly intact via light touch bilateral. Vibratory intact via tuning fork bilateral. Protective threshold with Semmes Wienstein monofilament intact to all pedal sites bilateral. Patellar and Achilles deep tendon reflexes 2+ bilateral. No Babinski or clonus noted bilateral.   Musculoskeletal: Tenderness to palpation along the plantar medial tubercle of the calcaneus at the insertion of plantar fascia on the left foot. There is no pain along the course of the plantar fascia within the arch of the foot. Plantar fascia appears to be intact. There is no pain with lateral compression of the calcaneus or pain with vibratory sensation. There is no pain along the course or insertion of the achilles tendon. No other areas of tenderness to  bilateral lower extremities. MMT 5/5.    Gait: Unassisted, Nonantalgic.      Assessment & Plan:  82 year old female left heel pain, likely plantar fasciitis. -Treatment options discussed including all alternatives, risks, and complications -X-rays were obtained and reviewed with the patient. No evidence of acute fracture or stress fracture identified at this time. No significant calcaneal spurring present. -Etiology of symptoms were discussed -Patient elects to proceed with steroid injection into the left heel. Under sterile skin preparation, a total of 2.5cc of kenalog 10, 0.5% Marcaine plain, and 2% lidocaine plain were infiltrated into the symptomatic area without complication. A band-aid was applied. Patient tolerated the injection well without complication. Post-injection care with discussed with the patient. Discussed with the patient to ice the area over the next couple of days to help prevent a steroid flare. Monitor blood surgery -Continue ice to the area. Stretching exercises. We'll hold off on anti-inflammatories given other medical conditions/allergies. Discussed shoe gear modifications and orthotics. Dispensed plantar fascial brace. -Follow-up in 3 weeks or sooner if any problems arise. In the meantime, encouraged to call the office with any questions, concerns, change in symptoms.   Celesta Gentile, DPM

## 2016-03-12 NOTE — Patient Instructions (Signed)

## 2016-03-21 ENCOUNTER — Other Ambulatory Visit: Payer: Self-pay | Admitting: Family Medicine

## 2016-03-21 NOTE — Telephone Encounter (Signed)
Patient requesting refill. 

## 2016-04-02 ENCOUNTER — Encounter: Payer: Self-pay | Admitting: Podiatry

## 2016-04-02 ENCOUNTER — Ambulatory Visit (INDEPENDENT_AMBULATORY_CARE_PROVIDER_SITE_OTHER): Payer: Medicare Other | Admitting: Podiatry

## 2016-04-02 VITALS — BP 107/56 | HR 75 | Resp 18

## 2016-04-02 DIAGNOSIS — M722 Plantar fascial fibromatosis: Secondary | ICD-10-CM | POA: Diagnosis not present

## 2016-04-02 NOTE — Progress Notes (Signed)
Patient ID: Susan Bowen, female   DOB: [birth year], 82 y.o.   MRN: [id number]  Subjective: 82 year old female presents to the office they for follow-up evaluation of left heel pain. She states that she is doing significantly better than last appointment. She still some occasional discomfort of the heel but is doing better. She's been stretching icing as well as the plantar fascial brace. Denies any systemic complaints such as fevers, chills, nausea, vomiting. No acute changes since last appointment, and no other complaints at this time.   Objective: AAO x3, NAD DP/PT pulses palpable bilaterally, CRT less than 3 seconds There is greatly improved, but mild continued tenderness to palpation along the plantar medial tubercle of the calcaneus at the insertion of plantar fascia on the left foot. There is no pain along the course of the plantar fascia within the arch of the foot. Plantar fascia appears to be intact. There is no pain with lateral compression of the calcaneus or pain with vibratory sensation. There is no pain along the course or insertion of the achilles tendon. No other areas of tenderness to bilateral lower extremities. No areas of pinpoint bony tenderness or pain with vibratory sensation. MMT 5/5, ROM WNL. No edema, erythema, increase in warmth to bilateral lower extremities.  No open lesions or pre-ulcerative lesions.  No pain with calf compression, swelling, warmth, erythema  Assessment: Resolving plantar fasciitis left foot  Plan: -All treatment options discussed with the patient including all alternatives, risks, complications.  -Patient elects to proceed with steroid injection into the left heel. Under sterile skin preparation, a total of 2.5cc of kenalog 10, 0.5% Marcaine plain, and 2% lidocaine plain were infiltrated into the symptomatic area without complication. A band-aid was applied. Patient tolerated the injection well without complication. Post-injection care with discussed  with the patient. Discussed with the patient to ice the area over the next couple of days to help prevent a steroid flare.  -Continue stretching, icing. Discussed inserts and shoe gear modifications. Continue plantar fascial brace. -Follow up in 4 weeks if symptoms continue or sooner if any issues are to arise. -Patient encouraged to call the office with any questions, concerns, change in symptoms.   Celesta Gentile, DPM

## 2016-04-09 ENCOUNTER — Ambulatory Visit: Payer: Medicare Other | Admitting: Family Medicine

## 2016-04-18 ENCOUNTER — Other Ambulatory Visit: Payer: Self-pay | Admitting: Family Medicine

## 2016-04-18 NOTE — Telephone Encounter (Signed)
Patient requesting refill. 

## 2016-04-22 ENCOUNTER — Ambulatory Visit (INDEPENDENT_AMBULATORY_CARE_PROVIDER_SITE_OTHER): Payer: Medicare Other | Admitting: Family Medicine

## 2016-04-22 ENCOUNTER — Encounter: Payer: Self-pay | Admitting: Family Medicine

## 2016-04-22 VITALS — BP 136/72 | HR 78 | Temp 98.4°F | Resp 16 | Ht 66.0 in | Wt 214.0 lb

## 2016-04-22 DIAGNOSIS — J069 Acute upper respiratory infection, unspecified: Secondary | ICD-10-CM

## 2016-04-22 DIAGNOSIS — E1121 Type 2 diabetes mellitus with diabetic nephropathy: Secondary | ICD-10-CM | POA: Diagnosis not present

## 2016-04-22 DIAGNOSIS — E890 Postprocedural hypothyroidism: Secondary | ICD-10-CM | POA: Diagnosis not present

## 2016-04-22 DIAGNOSIS — J302 Other seasonal allergic rhinitis: Secondary | ICD-10-CM

## 2016-04-22 DIAGNOSIS — S22000D Wedge compression fracture of unspecified thoracic vertebra, subsequent encounter for fracture with routine healing: Secondary | ICD-10-CM | POA: Diagnosis not present

## 2016-04-22 DIAGNOSIS — Z923 Personal history of irradiation: Secondary | ICD-10-CM | POA: Diagnosis not present

## 2016-04-22 DIAGNOSIS — E785 Hyperlipidemia, unspecified: Secondary | ICD-10-CM

## 2016-04-22 DIAGNOSIS — I10 Essential (primary) hypertension: Secondary | ICD-10-CM | POA: Diagnosis not present

## 2016-04-22 LAB — POCT UA - MICROALBUMIN: Microalbumin Ur, POC: NEGATIVE mg/L

## 2016-04-22 LAB — POCT GLYCOSYLATED HEMOGLOBIN (HGB A1C): HEMOGLOBIN A1C: 8.2

## 2016-04-22 MED ORDER — CARVEDILOL 25 MG PO TABS: 25.0000 mg | ORAL_TABLET | Freq: Two times a day (BID) | ORAL | Status: DC

## 2016-04-22 MED ORDER — LEVOTHYROXINE SODIUM 25 MCG PO TABS: 25.0000 ug | ORAL_TABLET | Freq: Every day | ORAL | Status: DC

## 2016-04-22 MED ORDER — CLONIDINE HCL 0.1 MG PO TABS: 0.1000 mg | ORAL_TABLET | Freq: Every day | ORAL | Status: DC

## 2016-04-22 MED ORDER — PRAVASTATIN SODIUM 40 MG PO TABS: ORAL_TABLET | ORAL | Status: DC

## 2016-04-22 MED ORDER — HYDROCOD POLST-CPM POLST ER 10-8 MG/5ML PO SUER: 5.0000 mL | Freq: Two times a day (BID) | ORAL | Status: DC | PRN

## 2016-04-22 MED ORDER — LORATADINE 10 MG PO TABS: 10.0000 mg | ORAL_TABLET | Freq: Every day | ORAL | Status: DC

## 2016-04-22 MED ORDER — METFORMIN HCL 850 MG PO TABS: 850.0000 mg | ORAL_TABLET | Freq: Two times a day (BID) | ORAL | Status: DC

## 2016-04-22 MED ORDER — SITAGLIPTIN PHOSPHATE 100 MG PO TABS: 100.0000 mg | ORAL_TABLET | Freq: Every day | ORAL | Status: DC

## 2016-04-22 MED ORDER — BENZONATATE 100 MG PO CAPS: 100.0000 mg | ORAL_CAPSULE | Freq: Two times a day (BID) | ORAL | Status: DC | PRN

## 2016-04-22 NOTE — Progress Notes (Signed)
Name: Susan Bowen   MRN: [id number]    DOB: [birth year]   Date:04/22/2016       Progress Note  Subjective  Chief Complaint  Chief Complaint  Patient presents with  . Medication Refill  . Diabetes    patient is scheduled for her eye exam next month.  . Hypertension    patient stated she has not been having any problems  . Gout  . Dyslipidemia  . URI    patient stated that she has been doing a lot of coughing for the past 2 days.  . Wheezing    she was told by her pulmonlogist to stop the [facility] some time ago but she restarted it when she started coughing again.    HPI  DMII with microalbuminuria; she is taking medications and also aspirin, ARB and statin therapy. FSBS at home has been around 130's-150's, but it has gone up to 195 . No polyphagia, polydipsia or polyuria. Her hgbA1C was 10.5% on her last visit and is now down to 8.2%. She has been compliant with diet and now is also exercising and we added Metformin twice daily. No side effects of medication.  HTN: she has been taking medication. No chest pain or palpitation,  but has decrease in exercise tolerance  Hyperlipidemia: taking Pravastatin and denies side effects of medications, she has notice some leg cramps in am's, advised to stretch before bedtime and drink more fluids if no improvement can take otc CoQ 10.Marland Kitchen    Plantar fascitis: she has noticed left heel pain when she first steps down or with pressure on left inner plantar area and heel. No redness or swelling. Currently seeing Podiatrist and is doing better since steroid injection  Morbid Obesity: she has been losing weight, by changing her life style. Dancing two or three times daily.  Pulmonary nodule/SOB: seen by Dr. Raul Del, lung nodule stable and has yearly follow ups She had an echo done that showed Tricuspid regurgitation and LVH, she will be referred to Dr. Fletcher Anon, and is seeing him yearly also. Denies chest pain at this time. SOB has improved with weight loss. She  was taking Breo and stopped until recent URI.   URI: symptoms started a few days ago, she has mild rhinorrhea also dry cough and wheezing, she also had a scratchy throat but that has resolved. She is back on Breo and states wheezing improved but would like to take something for the cough  Goiter: s/p thyroid ablation, Summer 2016. Seeing Dr. Gabriel Carina, she is on Levothyroxine 25 mcg daily    Patient Active Problem List   Diagnosis Date Noted  . Plantar fasciitis of left foot 03/12/2016  . Hypothyroidism, postablative 01/23/2016  . Atypical chest pain 09/26/2015  . Aortic stenosis 09/26/2015  . Allergic rhinitis, seasonal 08/08/2015  . Benign hypertension 08/08/2015  . History of pneumonia 08/08/2015  . Carpal tunnel syndrome 08/08/2015  . Cataract 08/08/2015  . Insomnia, persistent 08/08/2015  . Dyslipidemia 08/08/2015  . History of depression 08/08/2015  . Glaucoma 08/08/2015  . Controlled gout 08/08/2015  . Fever blister 08/08/2015  . Bilateral hearing loss 08/08/2015  . Hemorrhoid 08/08/2015  . H/O iron deficiency anemia 08/08/2015  . Benign neoplasm of colon 08/08/2015  . Impingement syndrome of shoulder 08/08/2015  . Osteoporosis, post-menopausal 08/08/2015  . Morbid obesity ([place]) 08/08/2015  . Generalized OA 08/08/2015  . Multinodular goiter 08/08/2015  . Abnormal presence of protein in urine 08/08/2015  . Asymptomatic varicose veins 08/08/2015  . Polyp  of vocal cord 08/08/2015  . Wedge fracture of thoracic vertebra ([place]) 08/08/2015  . Diabetes mellitus with proteinuric diabetic nephropathy ([place]) 08/08/2015  . LVH (left ventricular hypertrophy) 08/08/2015  . Moderate tricuspid regurgitation 08/08/2015  . History of radioactive iodine thyroid ablation 08/08/2015  . Lung nodule, solitary 05/12/2014  . Chronic cough 05/11/2014  . Vitamin D deficiency 12/20/2009  . Plantar fascial fibromatosis 12/20/2009  . Personal history of fall 07/20/2008  . Cervical radiculitis  05/11/2008    Past Surgical History  Procedure Laterality Date  . Fracture surgery  2001    left femur  . Polyp removed from vocal cord    . Carpal tunnel release    . Appendectomy    . Abdominal hysterectomy      Family History  Problem Relation Age of Onset  . Diabetes Mother   . Hypertension Mother   . Dementia Mother   . Hypertension Son     Social History   Social History  . Marital Status: Divorced    Spouse Name: N/A  . Number of Children: N/A  . Years of Education: N/A   Occupational History  . Not on file.   Social History Main Topics  . Smoking status: Never Smoker   . Smokeless tobacco: Never Used  . Alcohol Use: No  . Drug Use: No  . Sexual Activity: Not Currently   Other Topics Concern  . Not on file   Social History Narrative     Current outpatient prescriptions:  .  aspirin (ASPIRIN LOW DOSE) 81 MG chewable tablet, Chew 1 tablet by mouth daily., Disp: , Rfl:  .  BREO ELLIPTA 100-25 MCG/INH AEPB, Inhale 1 Inhaler into the lungs daily., Disp: , Rfl: 11 .  carvedilol (COREG) 25 MG tablet, Take 1 tablet (25 mg total) by mouth 2 (two) times daily., Disp: 180 tablet, Rfl: 1 .  cloNIDine (CATAPRES) 0.1 MG tablet, Take 1 tablet (0.1 mg total) by mouth at bedtime., Disp: 90 tablet, Rfl: 1 .  diclofenac sodium (VOLTAREN) 1 % GEL, Apply 1 application topically as needed., Disp: , Rfl:  .  fluticasone (FLONASE) 50 MCG/ACT nasal spray, Place 1 spray into the nose daily., Disp: , Rfl:  .  loratadine (CLARITIN) 10 MG tablet, Take 1 tablet (10 mg total) by mouth daily., Disp: 90 tablet, Rfl: 1 .  metFORMIN (GLUCOPHAGE) 850 MG tablet, Take 1 tablet (850 mg total) by mouth 2 (two) times daily., Disp: 180 tablet, Rfl: 1 .  Multiple Vitamin (MULTI-VITAMINS) TABS, Take 1 tablet by mouth daily., Disp: , Rfl:  .  Olmesartan-Amlodipine-HCTZ 40-10-25 MG TABS, 1 (ONE) TABLET, ORAL, ONCE DAILY IN PLACE OF IBERSATAN, AMLODIPINE AND HCTZ, Disp: 90 tablet, Rfl: 1 .   pravastatin (PRAVACHOL) 40 MG tablet, TAKE 1 TABLET (40 MG TOTAL) BY MOUTH DAILY., Disp: 90 tablet, Rfl: 1 .  sitaGLIPtin (JANUVIA) 100 MG tablet, Take 1 tablet (100 mg total) by mouth daily., Disp: 90 tablet, Rfl: 1 .  timolol (TIMOPTIC) 0.5 % ophthalmic solution, Place 1 drop into both eyes 2 (two) times daily., Disp: , Rfl: 5 .  traMADol (ULTRAM) 50 MG tablet, Take 1 tablet (50 mg total) by mouth every 6 (six) hours as needed., Disp: 60 tablet, Rfl: 0 .  travoprost, benzalkonium, (TRAVATAN) 0.004 % ophthalmic solution, Apply 1 drop to eye every evening., Disp: , Rfl:  .  Vitamin D, Ergocalciferol, (DRISDOL) 50000 units CAPS capsule, TAKE ONE CAPSULE BY MOUTH ONCE A WEEK, Disp: 12 capsule, Rfl: 0 .  glucose blood test strip, Use as instructed, Disp: 100 each, Rfl: 12 .  levothyroxine (SYNTHROID, LEVOTHROID) 25 MCG tablet, Take 1 tablet (25 mcg total) by mouth daily before breakfast., Disp: 30 tablet, Rfl: 0  Allergies  Allergen Reactions  . Augmentin [Amoxicillin-Pot Clavulanate] Itching     ROS  Constitutional: Negative for fever, positive for  weight change.  Respiratory: Positive  for cough and shortness of breath.   Cardiovascular: Negative for chest pain or palpitations.  Gastrointestinal: Negative for abdominal pain, no bowel changes.  Musculoskeletal: Negative for gait problem or joint swelling.  Skin: Negative for rash.  Neurological: Negative for dizziness or headache.  No other specific complaints in a complete review of systems (except as listed in HPI above).  Objective  Filed Vitals:   04/22/16 1506  BP: 136/72  Pulse: 78  Temp: 98.4 F (36.9 C)  TempSrc: Oral  Resp: 16  Height: 5\' 6"  (1.676 m)  Weight: 214 lb (97.07 kg)  SpO2: 96%    Body mass index is 34.56 kg/(m^2).  Physical Exam  Constitutional: Patient appears well-developed and well-nourished. Obese  No distress.  HEENT: head atraumatic, normocephalic, pupils equal and reactive to light, ears normal  TM, neck supple, throat within normal limits Cardiovascular: Normal rate, regular rhythm and normal heart sounds.  No murmur heard. No BLE edema. Pulmonary/Chest: Effort normal and breath sounds normal. No respiratory distress. Abdominal: Soft.  There is no tenderness. Psychiatric: Patient has a normal mood and affect. behavior is normal. Judgment and thought content normal.  Recent Results (from the past 2160 hour(s))  POCT glycosylated hemoglobin (Hb A1C)     Status: Abnormal   Collection Time: 04/22/16  3:38 PM  Result Value Ref Range   Hemoglobin A1C 8.2   POCT UA - Microalbumin     Status: Normal   Collection Time: 04/22/16  3:38 PM  Result Value Ref Range   Microalbumin Ur, POC NEGATIVE mg/L   Creatinine, POC  mg/dL   Albumin/Creatinine Ratio, Urine, POC       PHQ2/9: Depression screen [facility] 2/9 04/22/2016 12/11/2015 08/08/2015  Decreased Interest 0 0 0  Down, Depressed, Hopeless 0 0 0  PHQ - 2 Score 0 0 0     Fall Risk: Fall Risk  04/22/2016 12/11/2015 11/01/2015 08/08/2015  Falls in the past year? No No No No      Functional Status Survey: Is the patient deaf or have difficulty hearing?: No Does the patient have difficulty seeing, even when wearing glasses/contacts?: No Does the patient have difficulty concentrating, remembering, or making decisions?: No Does the patient have difficulty walking or climbing stairs?: No Does the patient have difficulty dressing or bathing?: No Does the patient have difficulty doing errands alone such as visiting a doctor's office or shopping?: No    Assessment & Plan  1. Diabetes mellitus with proteinuric diabetic nephropathy ([place])  hgbA1C has improved significantly and we will hold off on changing medication, continue life style modification  - POCT glycosylated hemoglobin (Hb A1C) - POCT UA - Microalbumin - sitaGLIPtin (JANUVIA) 100 MG tablet; Take 1 tablet (100 mg total) by mouth daily.  Dispense: 90 tablet; Refill: 1 - metFORMIN  (GLUCOPHAGE) 850 MG tablet; Take 1 tablet (850 mg total) by mouth 2 (two) times daily.  Dispense: 180 tablet; Refill: 1  2. Benign hypertension  - cloNIDine (CATAPRES) 0.1 MG tablet; Take 1 tablet (0.1 mg total) by mouth at bedtime.  Dispense: 90 tablet; Refill: 1 - carvedilol (COREG) 25 MG  tablet; Take 1 tablet (25 mg total) by mouth 2 (two) times daily.  Dispense: 180 tablet; Refill: 1  3. Dyslipidemia  - pravastatin (PRAVACHOL) 40 MG tablet; TAKE 1 TABLET (40 MG TOTAL) BY MOUTH DAILY.  Dispense: 90 tablet; Refill: 1  4. Upper respiratory infection  - chlorpheniramine-HYDROcodone (TUSSIONEX PENNKINETIC ER) 10-8 MG/5ML SUER; Take 5 mLs by mouth every 12 (twelve) hours as needed.  Dispense: 140 mL; Refill: 0 - benzonatate (TESSALON) 100 MG capsule; Take 1-2 capsules (100-200 mg total) by mouth 2 (two) times daily as needed.  Dispense: 40 capsule; Refill: 0  5. History of radioactive iodine thyroid ablation  - levothyroxine (SYNTHROID, LEVOTHROID) 25 MCG tablet; Take 1 tablet (25 mcg total) by mouth daily before breakfast.  Dispense: 30 tablet; Refill: 0  6. Closed wedge fracture of thoracic vertebra with routine healing, unspecified thoracic vertebral level, subsequent encounter  Doing well, no longer has back pain  7. Morbid obesity, unspecified obesity type [facility])  Discussed with the patient the risk posed by an increased BMI. Discussed importance of portion control, calorie counting and at least 150 minutes of physical activity weekly. Avoid sweet beverages and drink more water. Eat at least 6 servings of fruit and vegetables daily   8. Allergic rhinitis, seasonal  - loratadine (CLARITIN) 10 MG tablet; Take 1 tablet (10 mg total) by mouth daily.  Dispense: 90 tablet; Refill: 1

## 2016-04-29 DIAGNOSIS — E042 Nontoxic multinodular goiter: Secondary | ICD-10-CM | POA: Diagnosis not present

## 2016-04-29 DIAGNOSIS — E890 Postprocedural hypothyroidism: Secondary | ICD-10-CM | POA: Diagnosis not present

## 2016-04-30 ENCOUNTER — Ambulatory Visit: Payer: Medicare Other | Admitting: Podiatry

## 2016-06-07 ENCOUNTER — Other Ambulatory Visit: Payer: Self-pay | Admitting: Family Medicine

## 2016-06-07 DIAGNOSIS — E1121 Type 2 diabetes mellitus with diabetic nephropathy: Secondary | ICD-10-CM

## 2016-06-07 NOTE — Telephone Encounter (Signed)
Patient requesting refill. 

## 2016-06-26 ENCOUNTER — Other Ambulatory Visit: Payer: Self-pay | Admitting: Specialist

## 2016-06-26 DIAGNOSIS — J381 Polyp of vocal cord and larynx: Secondary | ICD-10-CM | POA: Diagnosis not present

## 2016-06-26 DIAGNOSIS — R911 Solitary pulmonary nodule: Secondary | ICD-10-CM

## 2016-06-26 DIAGNOSIS — J310 Chronic rhinitis: Secondary | ICD-10-CM | POA: Diagnosis not present

## 2016-07-10 ENCOUNTER — Ambulatory Visit: Payer: Medicare Other

## 2016-07-19 ENCOUNTER — Ambulatory Visit
Admission: RE | Admit: 2016-07-19 | Discharge: 2016-07-19 | Disposition: A | Discharge status: Home / Self Care | Payer: Medicare Other | Source: Ambulatory Visit | Attending: Specialist | Admitting: Specialist

## 2016-07-19 DIAGNOSIS — I281 Aneurysm of pulmonary artery: Secondary | ICD-10-CM | POA: Diagnosis not present

## 2016-07-19 DIAGNOSIS — K449 Diaphragmatic hernia without obstruction or gangrene: Secondary | ICD-10-CM | POA: Diagnosis not present

## 2016-07-19 DIAGNOSIS — I2510 Atherosclerotic heart disease of native coronary artery without angina pectoris: Secondary | ICD-10-CM | POA: Diagnosis not present

## 2016-07-19 DIAGNOSIS — I700 Atherosclerosis of aorta: Secondary | ICD-10-CM | POA: Insufficient documentation

## 2016-07-19 DIAGNOSIS — R911 Solitary pulmonary nodule: Secondary | ICD-10-CM | POA: Diagnosis not present

## 2016-07-19 DIAGNOSIS — R918 Other nonspecific abnormal finding of lung field: Secondary | ICD-10-CM | POA: Diagnosis not present

## 2016-07-19 DIAGNOSIS — I517 Cardiomegaly: Secondary | ICD-10-CM | POA: Insufficient documentation

## 2016-07-26 ENCOUNTER — Other Ambulatory Visit: Payer: Self-pay | Admitting: Family Medicine

## 2016-07-26 NOTE — Telephone Encounter (Signed)
Patient requesting refill of Olmesartan-Amlodipine-HCTZ be sent to [facility].

## 2016-08-05 DIAGNOSIS — H401131 Primary open-angle glaucoma, bilateral, mild stage: Secondary | ICD-10-CM | POA: Diagnosis not present

## 2016-08-14 DIAGNOSIS — H401131 Primary open-angle glaucoma, bilateral, mild stage: Secondary | ICD-10-CM | POA: Diagnosis not present

## 2016-08-26 ENCOUNTER — Ambulatory Visit (INDEPENDENT_AMBULATORY_CARE_PROVIDER_SITE_OTHER): Payer: Medicare Other | Admitting: Family Medicine

## 2016-08-26 ENCOUNTER — Encounter: Payer: Self-pay | Admitting: Family Medicine

## 2016-08-26 VITALS — BP 130/68 | HR 84 | Temp 97.9°F | Resp 16 | Ht 66.0 in | Wt 221.2 lb

## 2016-08-26 DIAGNOSIS — Z23 Encounter for immunization: Secondary | ICD-10-CM

## 2016-08-26 DIAGNOSIS — E1121 Type 2 diabetes mellitus with diabetic nephropathy: Secondary | ICD-10-CM | POA: Diagnosis not present

## 2016-08-26 DIAGNOSIS — Z79899 Other long term (current) drug therapy: Secondary | ICD-10-CM

## 2016-08-26 DIAGNOSIS — E785 Hyperlipidemia, unspecified: Secondary | ICD-10-CM

## 2016-08-26 DIAGNOSIS — I10 Essential (primary) hypertension: Secondary | ICD-10-CM | POA: Diagnosis not present

## 2016-08-26 DIAGNOSIS — M109 Gout, unspecified: Secondary | ICD-10-CM

## 2016-08-26 DIAGNOSIS — M159 Polyosteoarthritis, unspecified: Secondary | ICD-10-CM

## 2016-08-26 DIAGNOSIS — E559 Vitamin D deficiency, unspecified: Secondary | ICD-10-CM

## 2016-08-26 DIAGNOSIS — Z923 Personal history of irradiation: Secondary | ICD-10-CM

## 2016-08-26 LAB — POCT GLYCOSYLATED HEMOGLOBIN (HGB A1C): HEMOGLOBIN A1C: 7.7

## 2016-08-26 MED ORDER — DICLOFENAC SODIUM 1 % TD GEL: 4.0000 g | Freq: Four times a day (QID) | TRANSDERMAL | 1 refills | Status: DC

## 2016-08-26 MED ORDER — CLONIDINE HCL 0.1 MG PO TABS: 0.1000 mg | ORAL_TABLET | Freq: Every day | ORAL | 1 refills | Status: DC

## 2016-08-26 MED ORDER — NATEGLINIDE 60 MG PO TABS: 60.0000 mg | ORAL_TABLET | Freq: Three times a day (TID) | ORAL | 0 refills | Status: DC

## 2016-08-26 NOTE — Progress Notes (Signed)
Name: Susan Bowen   MRN: [id number]    DOB: January 26, 1932   Date:08/26/2016       Progress Note  Subjective  Chief Complaint  Chief Complaint  Patient presents with  . Diabetes    avg 123 high 230 low 73 checks blood sugars every other day  . Hyperlipidemia  . Flu Vaccine  . Thyroid Problem    HPI  DMII with microalbuminuria; she is taking medications and also aspirin, ARB and statin therapy. FSBS at home has been 123 average, high of 230 and low of 73. No polyphagia, polydipsia or polyuria. Her hgbA1C was 10.5%  down to 8.2% and now 7.7% She has been taking  Metformin twice daily, Januvia and Jardiance, she has been eating potatoes chips and drinking sodas for the past month. No side effects of medication.  HTN: she has been taking medication. No chest pain or palpitation, she has follow up with cardiologist coming up  Hyperlipidemia: taking Pravastatin and denies side effects of medications, leg cramps resolved with stretching legs daily  Plantar fascitis: doing well now, no longer has symptoms   Morbid Obesity: she gained 7 lbs since last visit, she thinks it is because she has been drinking sodas and eating chips again ( her son is buying her junk food again)   Pulmonary nodule/SOB: seen by Dr. Raul Del, lung nodule stable and has yearly follow ups She had an echo done that showed Tricuspid regurgitation and LVH, she will be referred to Dr. Fletcher Anon, and is seeing him yearly also. Denies chest pain at this time.   Goiter: s/p thyroid ablation, Summer 2016. Seeing Dr. Gabriel Carina, she is on Levothyroxine 25 mcg daily .   Patient Active Problem List   Diagnosis Date Noted  . Plantar fasciitis of left foot 03/12/2016  . Hypothyroidism, postablative 01/23/2016  . Atypical chest pain 09/26/2015  . Aortic stenosis 09/26/2015  . Allergic rhinitis, seasonal 08/08/2015  . Benign hypertension 08/08/2015  . History of pneumonia 08/08/2015  . Carpal tunnel syndrome 08/08/2015  . Cataract  08/08/2015  . Insomnia, persistent 08/08/2015  . Dyslipidemia 08/08/2015  . History of depression 08/08/2015  . Glaucoma 08/08/2015  . Controlled gout 08/08/2015  . Fever blister 08/08/2015  . Bilateral hearing loss 08/08/2015  . Hemorrhoid 08/08/2015  . H/O iron deficiency anemia 08/08/2015  . Benign neoplasm of colon 08/08/2015  . Impingement syndrome of shoulder 08/08/2015  . Osteoporosis, post-menopausal 08/08/2015  . Morbid obesity ([place]) 08/08/2015  . Generalized OA 08/08/2015  . Multinodular goiter 08/08/2015  . Abnormal presence of protein in urine 08/08/2015  . Asymptomatic varicose veins 08/08/2015  . Polyp of vocal cord 08/08/2015  . Wedge fracture of thoracic vertebra ([place]) 08/08/2015  . Diabetes mellitus with proteinuric diabetic nephropathy ([place]) 08/08/2015  . LVH (left ventricular hypertrophy) 08/08/2015  . Moderate tricuspid regurgitation 08/08/2015  . History of radioactive iodine thyroid ablation 08/08/2015  . Lung nodule, solitary 05/12/2014  . Chronic cough 05/11/2014  . Vitamin D deficiency 12/20/2009  . Plantar fascial fibromatosis 12/20/2009  . Personal history of fall 07/20/2008  . Cervical radiculitis 05/11/2008    Past Surgical History:  Procedure Laterality Date  . ABDOMINAL HYSTERECTOMY    . APPENDECTOMY    . CARPAL TUNNEL RELEASE    . FRACTURE SURGERY  2001   left femur  . polyp removed from vocal cord      Family History  Problem Relation Age of Onset  . Diabetes Mother   . Hypertension Mother   .  Dementia Mother   . Hypertension Son     Social History   Social History  . Marital status: Divorced    Spouse name: N/A  . Number of children: N/A  . Years of education: N/A   Occupational History  . Not on file.   Social History Main Topics  . Smoking status: Never Smoker  . Smokeless tobacco: Never Used  . Alcohol use No  . Drug use: No  . Sexual activity: Not Currently   Other Topics Concern  . Not on file   Social  History Narrative  . No narrative on file     Current Outpatient Prescriptions:  .  aspirin (ASPIRIN LOW DOSE) 81 MG chewable tablet, Chew 1 tablet by mouth daily., Disp: , Rfl:  .  carvedilol (COREG) 25 MG tablet, TAKE 1 TABLET BY MOUTH TWICE A DAY, Disp: 180 tablet, Rfl: 1 .  cloNIDine (CATAPRES) 0.1 MG tablet, Take 1 tablet (0.1 mg total) by mouth at bedtime., Disp: 90 tablet, Rfl: 1 .  diclofenac sodium (VOLTAREN) 1 % GEL, Apply 4 g topically 4 (four) times daily., Disp: 300 g, Rfl: 1 .  fluticasone (FLONASE) 50 MCG/ACT nasal spray, Place 1 spray into the nose daily., Disp: , Rfl:  .  glucose blood test strip, Use as instructed, Disp: 100 each, Rfl: 12 .  JARDIANCE 25 MG TABS tablet, TAKE 25 MG BY MOUTH DAILY., Disp: 90 tablet, Rfl: 1 .  levothyroxine (SYNTHROID, LEVOTHROID) 25 MCG tablet, Take 1 tablet (25 mcg total) by mouth daily before breakfast., Disp: 30 tablet, Rfl: 0 .  loratadine (CLARITIN) 10 MG tablet, Take 1 tablet (10 mg total) by mouth daily., Disp: 90 tablet, Rfl: 1 .  metFORMIN (GLUCOPHAGE) 850 MG tablet, Take 1 tablet (850 mg total) by mouth 2 (two) times daily., Disp: 180 tablet, Rfl: 1 .  Multiple Vitamin (MULTI-VITAMINS) TABS, Take 1 tablet by mouth daily., Disp: , Rfl:  .  nateglinide (STARLIX) 60 MG tablet, Take 1 tablet (60 mg total) by mouth 3 (three) times daily with meals., Disp: 90 tablet, Rfl: 0 .  Olmesartan-Amlodipine-HCTZ 40-10-25 MG TABS, 1 (ONE) TABLET, ORAL, ONCE DAILY IN PLACE OF IBERSATAN, AMLODIPINE AND HCTZ, Disp: 90 tablet, Rfl: 1 .  pravastatin (PRAVACHOL) 40 MG tablet, TAKE 1 TABLET (40 MG TOTAL) BY MOUTH DAILY., Disp: 90 tablet, Rfl: 1 .  sitaGLIPtin (JANUVIA) 100 MG tablet, Take 1 tablet (100 mg total) by mouth daily., Disp: 90 tablet, Rfl: 1 .  timolol (TIMOPTIC) 0.5 % ophthalmic solution, Place 1 drop into both eyes 2 (two) times daily., Disp: , Rfl: 5 .  travoprost, benzalkonium, (TRAVATAN) 0.004 % ophthalmic solution, Apply 1 drop to eye every  evening., Disp: , Rfl:  .  Vitamin D, Ergocalciferol, (DRISDOL) 50000 units CAPS capsule, TAKE ONE CAPSULE BY MOUTH ONCE A WEEK, Disp: 12 capsule, Rfl: 0  Allergies  Allergen Reactions  . Augmentin [Amoxicillin-Pot Clavulanate] Itching     ROS  Constitutional: Negative for fever, positive for weight change.  Respiratory: Negative for cough and shortness of breath.   Cardiovascular: Negative for chest pain or palpitations.  Gastrointestinal: Negative for abdominal pain, no bowel changes.  Musculoskeletal: Negative for gait problem or joint swelling.  Skin: Negative for rash.  Neurological: Negative for dizziness or headache.  No other specific complaints in a complete review of systems (except as listed in HPI above).  Objective  Vitals:   08/26/16 1346  BP: 130/68  Pulse: 84  Resp: 16  Temp: 97.9  F (36.6 C)  TempSrc: Oral  SpO2: 95%  Weight: 221 lb 4 oz (100.4 kg)  Height: 5\' 6"  (1.676 m)    Body mass index is 35.71 kg/m.  Physical Exam  Constitutional: Patient appears well-developed and well-nourished. Obese  No distress.  HEENT: head atraumatic, normocephalic, pupils equal and reactive to light, neck supple, throat within normal limits Cardiovascular: Normal rate, regular rhythm and normal heart sounds.  Systolic murmur heard 2/6. Trace BLE edema. Pulmonary/Chest: Effort normal and breath sounds normal. No respiratory distress. Abdominal: Soft.  There is no tenderness. Psychiatric: Patient has a normal mood and affect. behavior is normal. Judgment and thought content normal.  Recent Results (from the past 2160 hour(s))  POCT HgB A1C     Status: None   Collection Time: 08/26/16  2:03 PM  Result Value Ref Range   Hemoglobin A1C 7.7      PHQ2/9: Depression screen [facility] 2/9 08/26/2016 04/22/2016 12/11/2015 08/08/2015  Decreased Interest 0 0 0 0  Down, Depressed, Hopeless 0 0 0 0  PHQ - 2 Score 0 0 0 0     Fall Risk: Fall Risk  08/26/2016 04/22/2016 12/11/2015  11/01/2015 08/08/2015  Falls in the past year? No No No No No     Functional Status Survey: Is the patient deaf or have difficulty hearing?: Yes (right ear) Does the patient have difficulty seeing, even when wearing glasses/contacts?: Yes Does the patient have difficulty concentrating, remembering, or making decisions?: No Does the patient have difficulty walking or climbing stairs?: No Does the patient have difficulty dressing or bathing?: No Does the patient have difficulty doing errands alone such as visiting a doctor's office or shopping?: No    Assessment & Plan  1. Diabetes mellitus with proteinuric diabetic nephropathy (HCC)  - POCT HgB A1C - nateglinide (STARLIX) 60 MG tablet; Take 1 tablet (60 mg total) by mouth 3 (three) times daily with meals.  Dispense: 90 tablet; Refill: 0  2. Need for influenza vaccination  - Flu vaccine HIGH DOSE PF (Fluzone High dose)  3. Benign hypertension  - cloNIDine (CATAPRES) 0.1 MG tablet; Take 1 tablet (0.1 mg total) by mouth at bedtime.  Dispense: 90 tablet; Refill: 1  4. Dyslipidemia  Continue medication   5. History of radioactive iodine thyroid ablation  Sees Dr. Gabriel Carina and is doing well   6. Morbid obesity, unspecified obesity type [facility])  Discussed with the patient the risk posed by an increased BMI. Discussed importance of portion control, calorie counting and at least 150 minutes of physical activity weekly. Avoid sweet beverages and drink more water. Eat at least 6 servings of fruit and vegetables daily   7. Controlled gout  Doing well at this time  8. Generalized OA  - diclofenac sodium (VOLTAREN) 1 % GEL; Apply 4 g topically 4 (four) times daily.  Dispense: 300 g; Refill: 1

## 2016-09-22 ENCOUNTER — Other Ambulatory Visit: Payer: Self-pay | Admitting: Family Medicine

## 2016-09-23 ENCOUNTER — Other Ambulatory Visit: Payer: Self-pay

## 2016-09-23 DIAGNOSIS — E1121 Type 2 diabetes mellitus with diabetic nephropathy: Secondary | ICD-10-CM

## 2016-09-23 MED ORDER — NATEGLINIDE 60 MG PO TABS: 60.0000 mg | ORAL_TABLET | Freq: Three times a day (TID) | ORAL | 0 refills | Status: DC

## 2016-09-23 NOTE — Telephone Encounter (Signed)
Patient requesting refill of Nateglinide with a 90 day supply.

## 2016-09-30 ENCOUNTER — Encounter: Payer: Self-pay | Admitting: Cardiovascular Disease

## 2016-09-30 ENCOUNTER — Ambulatory Visit (INDEPENDENT_AMBULATORY_CARE_PROVIDER_SITE_OTHER): Payer: Medicare Other | Admitting: Cardiovascular Disease

## 2016-09-30 VITALS — BP 122/62 | HR 66 | Ht 65.5 in | Wt 222.5 lb

## 2016-09-30 DIAGNOSIS — I10 Essential (primary) hypertension: Secondary | ICD-10-CM

## 2016-09-30 DIAGNOSIS — I350 Nonrheumatic aortic (valve) stenosis: Secondary | ICD-10-CM | POA: Diagnosis not present

## 2016-09-30 DIAGNOSIS — I517 Cardiomegaly: Secondary | ICD-10-CM | POA: Diagnosis not present

## 2016-09-30 DIAGNOSIS — I071 Rheumatic tricuspid insufficiency: Secondary | ICD-10-CM | POA: Diagnosis not present

## 2016-09-30 NOTE — Patient Instructions (Addendum)
Medication Instructions:  Your physician recommends that you continue on your current medications as directed. Please refer to the Current Medication list given to you today.   Labwork: none  Testing/Procedures: Your physician has requested that you have an echocardiogram. Echocardiography is a painless test that uses sound waves to create images of your heart. It provides your doctor with information about the size and shape of your heart and how well your heart's chambers and valves are working. This procedure takes approximately one hour. There are no restrictions for this procedure.    Follow-Up: Your physician wants you to follow-up in: one year with Dr. Arida.  You will receive a reminder letter in the mail two months in advance. If you don't receive a letter, please call our office to schedule the follow-up appointment.   Any Other Special Instructions Will Be Listed Below (If Applicable).     If you need a refill on your cardiac medications before your next appointment, please call your pharmacy.  Echocardiogram An echocardiogram, or echocardiography, uses sound waves (ultrasound) to produce an image of your heart. The echocardiogram is simple, painless, obtained within a short period of time, and offers valuable information to your health care provider. The images from an echocardiogram can provide information such as:  Evidence of coronary artery disease (CAD).  Heart size.  Heart muscle function.  Heart valve function.  Aneurysm detection.  Evidence of a past heart attack.  Fluid buildup around the heart.  Heart muscle thickening.  Assess heart valve function. Tell a health care provider about:  Any allergies you have.  All medicines you are taking, including vitamins, herbs, eye drops, creams, and over-the-counter medicines.  Any problems you or family members have had with anesthetic medicines.  Any blood disorders you have.  Any surgeries you have  had.  Any medical conditions you have.  Whether you are pregnant or may be pregnant. What happens before the procedure? No special preparation is needed. Eat and drink normally. What happens during the procedure?  In order to produce an image of your heart, gel will be applied to your chest and a wand-like tool (transducer) will be moved over your chest. The gel will help transmit the sound waves from the transducer. The sound waves will harmlessly bounce off your heart to allow the heart images to be captured in real-time motion. These images will then be recorded.  You may need an IV to receive a medicine that improves the quality of the pictures. What happens after the procedure? You may return to your normal schedule including diet, activities, and medicines, unless your health care provider tells you otherwise. This information is not intended to replace advice given to you by your health care provider. Make sure you discuss any questions you have with your health care provider. Document Released: 10/18/2000 Document Revised: 06/08/2016 Document Reviewed: 06/28/2013 Elsevier Interactive Patient Education  2017 Elsevier Inc.  

## 2016-09-30 NOTE — Progress Notes (Signed)
Cardiology Office Note   Date:  09/30/2016   ID:  Susan Bowen, DOB July 01, 1932, MRN [id number]  PCP:  Loistine Chance, MD  Cardiologist:   Kathlyn Sacramento, MD   Chief Complaint  Patient presents with  . other    12 month fu. Pt c/o some SOB.  Reviewed meds with pt verbally.      History of Present Illness: Susan Bowen is a 82 y.o. female who presents for A follow-up visit regarding mild aortic stenosis, moderate tricuspid regurgitation and mild pulmonary hypertension. She has multiple chronic medical conditions that include hypertension, diabetes and hyperlipidemia.   She is not a smoker and has no family history of premature coronary artery disease. She was seen last year for atypical chest pain. She underwent a pharmacologic nuclear stress test which showed no evidence of ischemia with normal ejection fraction. She has been doing reasonably well with no chest pain. She reports stable exertional dyspnea which happens if she goes uphill or walks to her mailbox. No orthopnea or PND. She has occasional leg edema at the end of the day. She is compliant with her medications and reports improvement in diabetes control.   Past Medical History:  Diagnosis Date  . Allergy   . Bunion   . Carpal tunnel syndrome   . Cataracta   . Diabetes mellitus without complication ([place])   . Generalized osteoarthritis   . Gout   . Heart murmur   . Hemorrhoids without complication   . Hyperlipidemia   . Hypertension   . Impingement syndrome of right shoulder   . Insomnia   . Lipoma   . Lung nodule   . Mini stroke ([place])   . Neuritis or radiculitis due to rupture of lumbar intervertebral disc   . Obesity   . Osteoporosis   . Proteinuria   . Rotator cuff tear   . Tinnitus of both ears   . Unspecified glaucoma(365.9)   . Vitamin D deficiency   . Wedge compression fracture of t11-T12 vertebra, sequela     Past Surgical History:  Procedure Laterality Date  . ABDOMINAL HYSTERECTOMY      . APPENDECTOMY    . CARPAL TUNNEL RELEASE    . FRACTURE SURGERY  2001   left femur  . polyp removed from vocal cord       Current Outpatient Prescriptions  Medication Sig Dispense Refill  . aspirin (ASPIRIN LOW DOSE) 81 MG chewable tablet Chew 1 tablet by mouth daily.    . carvedilol (COREG) 25 MG tablet TAKE 1 TABLET BY MOUTH TWICE A DAY 180 tablet 1  . cloNIDine (CATAPRES) 0.1 MG tablet Take 1 tablet (0.1 mg total) by mouth at bedtime. 90 tablet 1  . diclofenac sodium (VOLTAREN) 1 % GEL Apply 4 g topically 4 (four) times daily. 300 g 1  . glucose blood test strip Use as instructed 100 each 12  . JARDIANCE 25 MG TABS tablet TAKE 25 MG BY MOUTH DAILY. 90 tablet 1  . levothyroxine (SYNTHROID, LEVOTHROID) 25 MCG tablet Take 1 tablet (25 mcg total) by mouth daily before breakfast. 30 tablet 0  . loratadine (CLARITIN) 10 MG tablet Take 1 tablet (10 mg total) by mouth daily. 90 tablet 1  . metFORMIN (GLUCOPHAGE) 850 MG tablet Take 1 tablet (850 mg total) by mouth 2 (two) times daily. 180 tablet 1  . Multiple Vitamin (MULTI-VITAMINS) TABS Take 1 tablet by mouth daily.    . nateglinide (STARLIX) 60 MG tablet  Take 1 tablet (60 mg total) by mouth 3 (three) times daily with meals. 270 tablet 0  . Olmesartan-Amlodipine-HCTZ 40-10-25 MG TABS 1 (ONE) TABLET, ORAL, ONCE DAILY IN PLACE OF IBERSATAN, AMLODIPINE AND HCTZ 90 tablet 1  . pravastatin (PRAVACHOL) 40 MG tablet TAKE 1 TABLET (40 MG TOTAL) BY MOUTH DAILY. 90 tablet 1  . sitaGLIPtin (JANUVIA) 100 MG tablet Take 1 tablet (100 mg total) by mouth daily. 90 tablet 1  . timolol (TIMOPTIC) 0.5 % ophthalmic solution Place 1 drop into both eyes 2 (two) times daily.  5  . travoprost, benzalkonium, (TRAVATAN) 0.004 % ophthalmic solution Apply 1 drop to eye every evening.    . Vitamin D, Ergocalciferol, (DRISDOL) 50000 units CAPS capsule TAKE ONE CAPSULE BY MOUTH ONCE A WEEK 12 capsule 0  . fluticasone (FLONASE) 50 MCG/ACT nasal spray Place 1 spray into  the nose daily.     No current facility-administered medications for this visit.     Allergies:   Augmentin [amoxicillin-pot clavulanate]    Social History:  The patient  reports that she has never smoked. She has never used smokeless tobacco. She reports that she does not drink alcohol or use drugs.   Family History:  The patient's family history includes Dementia in her mother; Diabetes in her mother; Hypertension in her mother and son.    ROS:  Please see the history of present illness.   Otherwise, review of systems are positive for none.   All other systems are reviewed and negative.    PHYSICAL EXAM: VS:  BP 122/62 (BP Location: Left Arm, Patient Position: Sitting, Cuff Size: Large)   Pulse 66   Ht 5' 5.5" (1.664 m)   Wt 222 lb 8 oz (100.9 kg)   BMI 36.46 kg/m  , BMI Body mass index is 36.46 kg/m. GEN: Well nourished, well developed, in no acute distress  HEENT: normal  Neck: no JVD, carotid bruits, or masses Cardiac: RRR; no murmurs, rubs, or gallops,no edema  Respiratory:  clear to auscultation bilaterally, normal work of breathing GI: soft, nontender, nondistended, + BS MS: no deformity or atrophy  Skin: warm and dry, no rash Neuro:  Strength and sensation are intact Psych: euthymic mood, full affect   EKG:  EKG is ordered today. The ekg ordered today demonstrates normal sinus rhythm with no significant ST or T wave changes.   Recent Labs: 11/27/2015: ALT 9; BUN 12; Creatinine, Ser 0.95; Platelets 324; Potassium 4.2; Sodium 141    Lipid Panel    Component Value Date/Time   CHOL 169 11/27/2015 0929   TRIG 107 11/27/2015 0929   HDL 61 11/27/2015 0929   CHOLHDL 2.8 11/27/2015 0929   LDLCALC 87 11/27/2015 0929      Wt Readings from Last 3 Encounters:  09/30/16 222 lb 8 oz (100.9 kg)  08/26/16 221 lb 4 oz (100.4 kg)  04/22/16 214 lb (97.1 kg)      No flowsheet data found.    ASSESSMENT AND PLAN:  1.  Mild aortic stenosis and moderate tricuspid  regurgitation: Surprisingly, she has no cardiac murmurs by exam today. She has stable exertional dyspnea. I requested a follow-up echocardiogram. Nuclear stress test last year showed no evidence of ischemia with normal ejection fraction.  2. Essential hypertension: Blood pressure is well controlled on current medications.  3. Hyperlipidemia: Currently on pravastatin with most recent LDL of 87.    Disposition:   FU with me in 1 year  Signed,  Kathlyn Sacramento, MD  09/30/2016 1:15 PM    [facility]

## 2016-10-03 ENCOUNTER — Other Ambulatory Visit: Payer: Self-pay

## 2016-10-03 ENCOUNTER — Ambulatory Visit (INDEPENDENT_AMBULATORY_CARE_PROVIDER_SITE_OTHER): Payer: Medicare Other

## 2016-10-03 DIAGNOSIS — I350 Nonrheumatic aortic (valve) stenosis: Secondary | ICD-10-CM

## 2016-10-15 ENCOUNTER — Ambulatory Visit (INDEPENDENT_AMBULATORY_CARE_PROVIDER_SITE_OTHER): Payer: Medicare Other | Admitting: Podiatry

## 2016-10-15 ENCOUNTER — Ambulatory Visit (INDEPENDENT_AMBULATORY_CARE_PROVIDER_SITE_OTHER): Payer: Medicare Other

## 2016-10-15 VITALS — BP 128/70 | HR 70 | Resp 16

## 2016-10-15 DIAGNOSIS — M25572 Pain in left ankle and joints of left foot: Secondary | ICD-10-CM

## 2016-10-15 DIAGNOSIS — R600 Localized edema: Secondary | ICD-10-CM | POA: Diagnosis not present

## 2016-10-15 DIAGNOSIS — M7662 Achilles tendinitis, left leg: Secondary | ICD-10-CM | POA: Diagnosis not present

## 2016-10-15 DIAGNOSIS — R52 Pain, unspecified: Secondary | ICD-10-CM

## 2016-10-15 DIAGNOSIS — M79674 Pain in right toe(s): Secondary | ICD-10-CM | POA: Diagnosis not present

## 2016-10-15 DIAGNOSIS — M7751 Other enthesopathy of right foot: Secondary | ICD-10-CM | POA: Diagnosis not present

## 2016-10-15 DIAGNOSIS — M7752 Other enthesopathy of left foot: Secondary | ICD-10-CM | POA: Diagnosis not present

## 2016-10-15 MED ORDER — NONFORMULARY OR COMPOUNDED ITEM: 1.0000 g | Freq: Four times a day (QID) | 2 refills | Status: DC

## 2016-10-15 MED ORDER — BETAMETHASONE SOD PHOS & ACET 6 (3-3) MG/ML IJ SUSP: 3.0000 mg | Freq: Once | INTRAMUSCULAR | Status: DC

## 2016-10-15 NOTE — Progress Notes (Signed)
Subjective:  Patient presents today with a new complaint of pain and tenderness to the Achilles tendon of the left foot. Patient states the pain is been sharp and hurting for the past 7 days. Patient states that it hurts when she walks. Patient states there Achilles tendon feels weak. Patient has used Voltaren gel with no relief. She is currently taking tramadol and receives a little bit of relief from pain. Patient was last seen on 04/02/2016 under the care of Dr. Earleen Newport for resolved plantar fasciitis left foot. Patient presents today for further treatment and evaluation    Objective/Physical Exam General: The patient is alert and oriented x3 in no acute distress.  Dermatology: Skin is warm, dry and supple bilateral lower extremities. Negative for open lesions or macerations.  Vascular: Palpable pedal pulses bilaterally. No edema or erythema noted. Capillary refill within normal limits.  Neurological: Epicritic and protective threshold grossly intact bilaterally.   Musculoskeletal Exam: Significant pain on palpation noted to the Achilles tendon left lower extremity just proximal to the insertion at the posterior tubercle of the calcaneus. Pain on palpation noted proximal extending approximately 4 cm proximal to the insertion. Pain with dorsiflexion of the foot. Range of motion within normal limits to all pedal and ankle joints bilateral. Muscle strength 5/5 in all groups bilateral.   Radiographic Exam:  Normal osseous mineralization. Joint spaces preserved. No fracture/dislocation/boney destruction.    Assessment: #1 the patient has adequate plantar flexion. At the moment we are not going to consider this in the Achilles tendon rupture. #2 Achilles tendinitis left lower extremity #3 retrocalcaneal bursitis left lower extremity #4 edema left ankle   Plan of Care:  #1 Patient was evaluated. #2 injection of 0.5 mL Celestone Soluspan injected into the retrocalcaneal bursa of the Achilles  tendon. Care was taken to avoid direct injection into the Achilles tendon #3 prescription for meloxicam #4 prescription for anti-inflammatory pain cream dispensed through [facility] #5 compression anklet dispensed #6 cam boot with a heel left dispensed #7 instructed the patient she can ambulate in the cam boot. Return to clinic in 4 weeks   Edrick Kins, DPM [facility]  Dr. Edrick Kins, [address]                                        [place], [place] [address]                Office [phone]  Fax [phone]

## 2016-10-17 ENCOUNTER — Telehealth: Payer: Self-pay | Admitting: *Deleted

## 2016-10-17 MED ORDER — MELOXICAM 15 MG PO TABS: 15.0000 mg | ORAL_TABLET | Freq: Every day | ORAL | 1 refills | Status: DC

## 2016-10-17 NOTE — Telephone Encounter (Signed)
Pt states she wants to know about [facility] medications at [facility]. I spoke with Sherron Monday and he states the rx from Dr. Amalia Hailey was received today, and pt will be called. I reviewed pt's LOV and she was to receive Dr. Amalia Hailey standard Meloxicam order. I ordered Meloxicam. I spoke with pt and told her the [facility] and [facility] would call with insurance and shipping information and gave [phone] to be aware of on her phone screen. Pt states understanding.

## 2016-10-18 DIAGNOSIS — E890 Postprocedural hypothyroidism: Secondary | ICD-10-CM | POA: Diagnosis not present

## 2016-10-18 DIAGNOSIS — E042 Nontoxic multinodular goiter: Secondary | ICD-10-CM | POA: Diagnosis not present

## 2016-10-25 DIAGNOSIS — E042 Nontoxic multinodular goiter: Secondary | ICD-10-CM | POA: Diagnosis not present

## 2016-10-25 DIAGNOSIS — E890 Postprocedural hypothyroidism: Secondary | ICD-10-CM | POA: Diagnosis not present

## 2016-11-12 ENCOUNTER — Ambulatory Visit (INDEPENDENT_AMBULATORY_CARE_PROVIDER_SITE_OTHER): Payer: Medicare Other | Admitting: Podiatry

## 2016-11-12 DIAGNOSIS — M7751 Other enthesopathy of right foot: Secondary | ICD-10-CM

## 2016-11-12 DIAGNOSIS — M25571 Pain in right ankle and joints of right foot: Secondary | ICD-10-CM | POA: Diagnosis not present

## 2016-11-17 MED ORDER — BETAMETHASONE SOD PHOS & ACET 6 (3-3) MG/ML IJ SUSP: 3.0000 mg | Freq: Once | INTRAMUSCULAR | Status: DC

## 2016-11-17 NOTE — Progress Notes (Signed)
Subjective:  Patient presents today for follow-up evaluation of Achilles tendon tendinitis the left lower extremity. Patient states that the injection helped. She has been taking the meloxicam but does not notice improvement. Patient presents for follow-up treatment and evaluation    Objective/Physical Exam General: The patient is alert and oriented x3 in no acute distress.  Dermatology: Skin is warm, dry and supple bilateral lower extremities. Negative for open lesions or macerations.  Vascular: Palpable pedal pulses bilaterally. No edema or erythema noted. Capillary refill within normal limits.  Neurological: Epicritic and protective threshold grossly intact bilaterally.   Musculoskeletal Exam: Pain on palpation to the peroneal tendon sheath just posterior to the lateral malleolus of the right ankle. Pain with forced eversion. Range of motion within normal limits to all pedal and ankle joints bilateral. Muscle strength 5/5 in all groups bilateral.   Assessment: #1 peroneal right #2 Achilles tendinitis-improved #3 pain in right ankle   Plan of Care:  #1 Patient was evaluated. #2 injection of 0.5 mL Celestone Soluspan injected into the peroneal tendon sheath of the right ankle #3 ankle brace dispensed #4 continue meloxicam, anti-inflammatory pain cream through [facility]  #5 return to clinic in 4 weeks  Edrick Kins, DPM [facility]  Dr. Edrick Kins, [address]                                        [place], [place] [address]                Office [phone]  Fax [phone]

## 2016-11-24 ENCOUNTER — Other Ambulatory Visit: Payer: Self-pay | Admitting: Family Medicine

## 2016-11-24 DIAGNOSIS — E785 Hyperlipidemia, unspecified: Secondary | ICD-10-CM

## 2016-11-29 ENCOUNTER — Other Ambulatory Visit: Payer: Self-pay | Admitting: Family Medicine

## 2016-11-29 DIAGNOSIS — E1121 Type 2 diabetes mellitus with diabetic nephropathy: Secondary | ICD-10-CM

## 2016-12-13 ENCOUNTER — Ambulatory Visit: Payer: Medicare Other | Admitting: Podiatry

## 2016-12-19 ENCOUNTER — Other Ambulatory Visit: Payer: Self-pay | Admitting: Family Medicine

## 2016-12-19 DIAGNOSIS — E1121 Type 2 diabetes mellitus with diabetic nephropathy: Secondary | ICD-10-CM | POA: Diagnosis not present

## 2016-12-19 DIAGNOSIS — E785 Hyperlipidemia, unspecified: Secondary | ICD-10-CM | POA: Diagnosis not present

## 2016-12-19 DIAGNOSIS — Z79899 Other long term (current) drug therapy: Secondary | ICD-10-CM | POA: Diagnosis not present

## 2016-12-19 DIAGNOSIS — E559 Vitamin D deficiency, unspecified: Secondary | ICD-10-CM | POA: Diagnosis not present

## 2016-12-19 DIAGNOSIS — M109 Gout, unspecified: Secondary | ICD-10-CM | POA: Diagnosis not present

## 2016-12-20 LAB — CBC WITH DIFFERENTIAL/PLATELET
BASOS ABS: 0 10*3/uL (ref 0.0–0.2)
Basos: 0 %
EOS (ABSOLUTE): 0.4 10*3/uL (ref 0.0–0.4)
EOS: 4 %
HEMOGLOBIN: 11.8 g/dL (ref 11.1–15.9)
Hematocrit: 37.9 % (ref 34.0–46.6)
IMMATURE GRANS (ABS): 0 10*3/uL (ref 0.0–0.1)
Immature Granulocytes: 0 %
LYMPHS: 33 %
Lymphocytes Absolute: 3.3 10*3/uL — ABNORMAL HIGH (ref 0.7–3.1)
MCH: 26.9 pg (ref 26.6–33.0)
MCHC: 31.1 g/dL — ABNORMAL LOW (ref 31.5–35.7)
MCV: 86 fL (ref 79–97)
MONOCYTES: 8 %
Monocytes Absolute: 0.8 10*3/uL (ref 0.1–0.9)
Neutrophils Absolute: 5.5 10*3/uL (ref 1.4–7.0)
Neutrophils: 55 %
Platelets: 303 10*3/uL (ref 150–379)
RBC: 4.39 x10E6/uL (ref 3.77–5.28)
RDW: 16.6 % — ABNORMAL HIGH (ref 12.3–15.4)
WBC: 10 10*3/uL (ref 3.4–10.8)

## 2016-12-20 LAB — COMPREHENSIVE METABOLIC PANEL
ALBUMIN: 4 g/dL (ref 3.5–4.7)
ALK PHOS: 69 IU/L (ref 39–117)
ALT: 10 IU/L (ref 0–32)
AST: 11 IU/L (ref 0–40)
Albumin/Globulin Ratio: 1.5 (ref 1.2–2.2)
BUN / CREAT RATIO: 13 (ref 12–28)
BUN: 17 mg/dL (ref 8–27)
Bilirubin Total: 0.2 mg/dL (ref 0.0–1.2)
CALCIUM: 9.3 mg/dL (ref 8.7–10.3)
CO2: 21 mmol/L (ref 18–29)
CREATININE: 1.32 mg/dL — AB (ref 0.57–1.00)
Chloride: 100 mmol/L (ref 96–106)
GFR, EST AFRICAN AMERICAN: 43 mL/min/{1.73_m2} — AB (ref >59)
GFR, EST NON AFRICAN AMERICAN: 37 mL/min/{1.73_m2} — AB (ref >59)
GLUCOSE: 307 mg/dL — AB (ref 65–99)
Globulin, Total: 2.6 g/dL (ref 1.5–4.5)
Potassium: 4.3 mmol/L (ref 3.5–5.2)
Sodium: 142 mmol/L (ref 134–144)
TOTAL PROTEIN: 6.6 g/dL (ref 6.0–8.5)

## 2016-12-20 LAB — LIPID PANEL W/O CHOL/HDL RATIO
CHOLESTEROL TOTAL: 146 mg/dL (ref 100–199)
HDL: 50 mg/dL (ref >39)
LDL CALC: 69 mg/dL (ref 0–99)
Triglycerides: 134 mg/dL (ref 0–149)
VLDL CHOLESTEROL CAL: 27 mg/dL (ref 5–40)

## 2016-12-20 LAB — VITAMIN D 25 HYDROXY (VIT D DEFICIENCY, FRACTURES): Vit D, 25-Hydroxy: 34.1 ng/mL (ref 30.0–100.0)

## 2016-12-20 LAB — HGB A1C W/O EAG: HEMOGLOBIN A1C: 11.8 % — AB (ref 4.8–5.6)

## 2016-12-20 LAB — URIC ACID: Uric Acid: 8.5 mg/dL — ABNORMAL HIGH (ref 2.5–7.1)

## 2016-12-24 DIAGNOSIS — R0609 Other forms of dyspnea: Secondary | ICD-10-CM | POA: Diagnosis not present

## 2016-12-24 DIAGNOSIS — I272 Pulmonary hypertension, unspecified: Secondary | ICD-10-CM | POA: Diagnosis not present

## 2016-12-24 DIAGNOSIS — J310 Chronic rhinitis: Secondary | ICD-10-CM | POA: Diagnosis not present

## 2016-12-24 DIAGNOSIS — R911 Solitary pulmonary nodule: Secondary | ICD-10-CM | POA: Diagnosis not present

## 2016-12-29 ENCOUNTER — Other Ambulatory Visit: Payer: Self-pay | Admitting: Family Medicine

## 2016-12-30 ENCOUNTER — Ambulatory Visit (INDEPENDENT_AMBULATORY_CARE_PROVIDER_SITE_OTHER): Payer: Medicare Other | Admitting: Family Medicine

## 2016-12-30 ENCOUNTER — Encounter: Payer: Self-pay | Admitting: Family Medicine

## 2016-12-30 VITALS — BP 109/56 | HR 85 | Temp 97.5°F | Resp 16 | Ht 66.0 in | Wt 219.5 lb

## 2016-12-30 DIAGNOSIS — E1165 Type 2 diabetes mellitus with hyperglycemia: Secondary | ICD-10-CM

## 2016-12-30 DIAGNOSIS — E785 Hyperlipidemia, unspecified: Secondary | ICD-10-CM | POA: Diagnosis not present

## 2016-12-30 DIAGNOSIS — E1129 Type 2 diabetes mellitus with other diabetic kidney complication: Secondary | ICD-10-CM

## 2016-12-30 DIAGNOSIS — R809 Proteinuria, unspecified: Secondary | ICD-10-CM

## 2016-12-30 DIAGNOSIS — E1121 Type 2 diabetes mellitus with diabetic nephropathy: Secondary | ICD-10-CM | POA: Diagnosis not present

## 2016-12-30 DIAGNOSIS — IMO0002 Reserved for concepts with insufficient information to code with codable children: Secondary | ICD-10-CM

## 2016-12-30 DIAGNOSIS — E890 Postprocedural hypothyroidism: Secondary | ICD-10-CM | POA: Diagnosis not present

## 2016-12-30 DIAGNOSIS — I10 Essential (primary) hypertension: Secondary | ICD-10-CM

## 2016-12-30 DIAGNOSIS — E1169 Type 2 diabetes mellitus with other specified complication: Secondary | ICD-10-CM

## 2016-12-30 MED ORDER — CLONIDINE HCL 0.1 MG PO TABS: 0.1000 mg | ORAL_TABLET | Freq: Every day | ORAL | 1 refills | Status: DC

## 2016-12-30 MED ORDER — DULAGLUTIDE 1.5 MG/0.5ML ~~LOC~~ SOAJ: 1.5000 mg | SUBCUTANEOUS | 5 refills | Status: DC

## 2016-12-30 MED ORDER — CARVEDILOL 25 MG PO TABS: 25.0000 mg | ORAL_TABLET | Freq: Two times a day (BID) | ORAL | 1 refills | Status: DC

## 2016-12-30 MED ORDER — METFORMIN HCL 850 MG PO TABS: 850.0000 mg | ORAL_TABLET | Freq: Two times a day (BID) | ORAL | 1 refills | Status: DC

## 2016-12-30 MED ORDER — OLMESARTAN-AMLODIPINE-HCTZ 40-10-25 MG PO TABS: ORAL_TABLET | ORAL | 1 refills | Status: DC

## 2016-12-30 NOTE — Patient Instructions (Signed)
Stop Januvia and [employer]

## 2016-12-30 NOTE — Telephone Encounter (Signed)
Patient requesting ref[facility] of [facility] to [facility]. 

## 2016-12-30 NOTE — Progress Notes (Signed)
Name: Susan Bowen   MRN: [id number]    DOB: 02/26/1932   Date:12/30/2016       Progress Note  Subjective  Chief Complaint  Chief Complaint  Patient presents with  . Medication Refill  . Diabetes    Checks BS every other day, Highest-355 Lowest-69, BS has been up and down  . Hypertension    Patient complains of being lightheaded and off- BP has been running low. Has lose 4 pounds due to eating healthier.   . Hyperlipidemia    Cramps periodically  . Hypothyroidism    Stable-Dry Skin  . Allergic Rhinitis     Well controlled    HPI   DMII with microalbuminuria; she is taking medications and also aspirin, ARB and statin therapy. FSBS at home has been 69-355. Average 200's.  No polyphagia, but has occasional polydipsia and polyuria. Her hgbA1C was 10.5%  down to 8.2% , 7.7% and now is up 11.8%  to  She has been taking  Metformin twice daily, Januvia and Jardiance. She has stopped eating potato chips and drinking sodas, but glucose is higher than before. We will stop Januvia and try Trulicity. She states she cannot do one shot daily. We will also try home health to give her diabetic education, since she does not drive   HTN: she has been taking medication. No chest pain or palpitation, but has been noticing dizziness when getting up at times. BP is low today, but it was high during visit with Dr. Raul Del two weeks ago, and normal when seen by Dr. Gabriel Carina 2 months ago. We will try to have it monitor bp at home and we may need to adjust dose of medication.   Hyperlipidemia: taking Pravastatin and denies side effects of medications/   Morbid Obesity: she lost weight since last visit  Pulmonary nodule/SOB: seen by Dr. Raul Del, lung nodule stable and has yearly follow ups She had an echo done that showed Tricuspid regurgitation and LVH. Seen by Dr. Fletcher Anon, negative stress test.   Goiter: s/p thyroid ablation, Summer 2016. Seeing Dr. Gabriel Carina, she is on Levothyroxine 25 mcg daily .  Patient  Active Problem List   Diagnosis Date Noted  . Plantar fasciitis of left foot 03/12/2016  . Hypothyroidism, postablative 01/23/2016  . Atypical chest pain 09/26/2015  . Aortic stenosis 09/26/2015  . Allergic rhinitis, seasonal 08/08/2015  . Benign hypertension 08/08/2015  . History of pneumonia 08/08/2015  . Carpal tunnel syndrome 08/08/2015  . Cataract 08/08/2015  . Insomnia, persistent 08/08/2015  . Dyslipidemia 08/08/2015  . History of depression 08/08/2015  . Glaucoma 08/08/2015  . Controlled gout 08/08/2015  . Fever blister 08/08/2015  . Bilateral hearing loss 08/08/2015  . Hemorrhoid 08/08/2015  . H/O iron deficiency anemia 08/08/2015  . Benign neoplasm of colon 08/08/2015  . Impingement syndrome of shoulder 08/08/2015  . Osteoporosis, post-menopausal 08/08/2015  . Morbid obesity ([place]) 08/08/2015  . Generalized OA 08/08/2015  . Multinodular goiter 08/08/2015  . Asymptomatic varicose veins 08/08/2015  . Polyp of vocal cord 08/08/2015  . Wedge fracture of thoracic vertebra ([place]) 08/08/2015  . Diabetes mellitus with proteinuric diabetic nephropathy ([place]) 08/08/2015  . LVH (left ventricular hypertrophy) 08/08/2015  . Moderate tricuspid regurgitation 08/08/2015  . History of radioactive iodine thyroid ablation 08/08/2015  . Lung nodule, solitary 05/12/2014  . Chronic cough 05/11/2014  . Vitamin D deficiency 12/20/2009  . Plantar fascial fibromatosis 12/20/2009  . Personal history of fall 07/20/2008  . Cervical radiculitis 05/11/2008  Past Surgical History:  Procedure Laterality Date  . ABDOMINAL HYSTERECTOMY    . APPENDECTOMY    . CARPAL TUNNEL RELEASE    . FRACTURE SURGERY  2001   left femur  . polyp removed from vocal cord      Family History  Problem Relation Age of Onset  . Diabetes Mother   . Hypertension Mother   . Dementia Mother   . Hypertension Son     Social History   Social History  . Marital status: Divorced    Spouse name: N/A  . Number  of children: N/A  . Years of education: N/A   Occupational History  . Not on file.   Social History Main Topics  . Smoking status: Never Smoker  . Smokeless tobacco: Never Used  . Alcohol use No  . Drug use: No  . Sexual activity: Not Currently   Other Topics Concern  . Not on file   Social History Narrative  . No narrative on file     Current Outpatient Prescriptions:  .  aspirin (ASPIRIN LOW DOSE) 81 MG chewable tablet, Chew 1 tablet by mouth daily., Disp: , Rfl:  .  carvedilol (COREG) 25 MG tablet, TAKE 1 TABLET BY MOUTH TWICE A DAY, Disp: 180 tablet, Rfl: 1 .  cloNIDine (CATAPRES) 0.1 MG tablet, Take 1 tablet (0.1 mg total) by mouth at bedtime., Disp: 90 tablet, Rfl: 1 .  Cobalamine Combinations (B-12) [phone] MCG SUBL, Take 1 each by mouth daily., Disp: , Rfl:  .  diclofenac sodium (VOLTAREN) 1 % GEL, Apply 4 g topically 4 (four) times daily., Disp: 300 g, Rfl: 1 .  glucose blood test strip, Use as instructed, Disp: 100 each, Rfl: 12 .  JARDIANCE 25 MG TABS tablet, TAKE 25 MG BY MOUTH DAILY., Disp: 90 tablet, Rfl: 1 .  levothyroxine (SYNTHROID, LEVOTHROID) 50 MCG tablet, Take 1 tablet by mouth daily., Disp: , Rfl:  .  loratadine (CLARITIN) 10 MG tablet, Take 1 tablet (10 mg total) by mouth daily., Disp: 90 tablet, Rfl: 1 .  meloxicam (MOBIC) 15 MG tablet, Take 1 tablet (15 mg total) by mouth daily., Disp: 60 tablet, Rfl: 1 .  metFORMIN (GLUCOPHAGE) 850 MG tablet, Take 1 tablet (850 mg total) by mouth 2 (two) times daily., Disp: 180 tablet, Rfl: 1 .  Multiple Vitamin (MULTI-VITAMINS) TABS, Take 1 tablet by mouth daily., Disp: , Rfl:  .  NONFORMULARY OR COMPOUNDED ITEM, Apply 1-2 g topically 4 (four) times daily., Disp: 120 each, Rfl: 2 .  Olmesartan-Amlodipine-HCTZ 40-10-25 MG TABS, 1 (ONE) TABLET, ORAL, ONCE DAILY IN PLACE OF IBERSATAN, AMLODIPINE AND HCTZ, Disp: 90 tablet, Rfl: 1 .  pravastatin (PRAVACHOL) 40 MG tablet, TAKE 1 TABLET (40 MG TOTAL) BY MOUTH DAILY., Disp: 90  tablet, Rfl: 1 .  timolol (TIMOPTIC) 0.5 % ophthalmic solution, Place 1 drop into both eyes 2 (two) times daily., Disp: , Rfl: 5 .  travoprost, benzalkonium, (TRAVATAN) 0.004 % ophthalmic solution, Apply 1 drop to eye every evening., Disp: , Rfl:  .  Vitamin D, Ergocalciferol, (DRISDOL) 50000 units CAPS capsule, TAKE ONE CAPSULE BY MOUTH ONCE A WEEK, Disp: 12 capsule, Rfl: 0 .  fluticasone (FLONASE) 50 MCG/ACT nasal spray, Place 1 spray into the nose daily., Disp: , Rfl:   Allergies  Allergen Reactions  . Augmentin [Amoxicillin-Pot Clavulanate] Itching     ROS  Constitutional: Negative for fever or weight change.  Respiratory: Negative for cough and shortness of breath.   Cardiovascular: Negative for  chest pain or palpitations.  Gastrointestinal: Negative for abdominal pain, no bowel changes.  Musculoskeletal: Negative for gait problem or joint swelling.  Skin: Negative for rash.  Neurological: Positive for intermittent dizziness when she gest up quickly, no headache.  No other specific complaints in a complete review of systems (except as listed in HPI above).  Objective  Vitals:   12/30/16 1158  BP: (!) 109/56  Pulse: 85  Resp: 16  Temp: 97.5 F (36.4 C)  TempSrc: Oral  SpO2: 94%  Weight: 219 lb 8 oz (99.6 kg)  Height: 5\' 6"  (1.676 m)    Body mass index is 35.43 kg/m.  Physical Exam  Constitutional: Patient appears well-developed and well-nourished. Obese  No distress.  HEENT: head atraumatic, normocephalic, pupils equal and reactive to light,  neck supple, throat within normal limits Cardiovascular: Normal rate, regular rhythm and normal heart sounds.  No murmur heard. No BLE edema. Pulmonary/Chest: Effort normal and breath sounds normal. No respiratory distress. Abdominal: Soft.  There is no tenderness. Psychiatric: Patient has a normal mood and affect. behavior is normal. Judgment and thought content normal.  Recent Results (from the past 2160 hour(s))  CBC  with Differential/Platelet     Status: Abnormal   Collection Time: 12/19/16  9:51 AM  Result Value Ref Range   WBC 10.0 3.4 - 10.8 x10E3/uL   RBC 4.39 3.77 - 5.28 x10E6/uL   Hemoglobin 11.8 11.1 - 15.9 g/dL   Hematocrit 37.9 34.0 - 46.6 %   MCV 86 79 - 97 fL   MCH 26.9 26.6 - 33.0 pg   MCHC 31.1 (L) 31.5 - 35.7 g/dL   RDW 16.6 (H) 12.3 - 15.4 %   Platelets 303 150 - 379 x10E3/uL   Neutrophils 55 Not Estab. %   Lymphs 33 Not Estab. %   Monocytes 8 Not Estab. %   Eos 4 Not Estab. %   Basos 0 Not Estab. %   Neutrophils Absolute 5.5 1.4 - 7.0 x10E3/uL   Lymphocytes Absolute 3.3 (H) 0.7 - 3.1 x10E3/uL   Monocytes Absolute 0.8 0.1 - 0.9 x10E3/uL   EOS (ABSOLUTE) 0.4 0.0 - 0.4 x10E3/uL   Basophils Absolute 0.0 0.0 - 0.2 x10E3/uL   Immature Granulocytes 0 Not Estab. %   Immature Grans (Abs) 0.0 0.0 - 0.1 x10E3/uL  Comprehensive metabolic panel     Status: Abnormal   Collection Time: 12/19/16  9:51 AM  Result Value Ref Range   Glucose 307 (H) 65 - 99 mg/dL   BUN 17 8 - 27 mg/dL   Creatinine, Ser 1.32 (H) 0.57 - 1.00 mg/dL   GFR calc non Af Amer 37 (L) >59 mL/min/1.73   GFR calc Af Amer 43 (L) >59 mL/min/1.73   BUN/Creatinine Ratio 13 12 - 28   Sodium 142 134 - 144 mmol/L   Potassium 4.3 3.5 - 5.2 mmol/L   Chloride 100 96 - 106 mmol/L   CO2 21 18 - 29 mmol/L   Calcium 9.3 8.7 - 10.3 mg/dL   Total Protein 6.6 6.0 - 8.5 g/dL   Albumin 4.0 3.5 - 4.7 g/dL   Globulin, Total 2.6 1.5 - 4.5 g/dL   Albumin/Globulin Ratio 1.5 1.2 - 2.2   Bilirubin Total 0.2 0.0 - 1.2 mg/dL   Alkaline Phosphatase 69 39 - 117 IU/L   AST 11 0 - 40 IU/L   ALT 10 0 - 32 IU/L  Lipid Panel w/o Chol/HDL Ratio     Status: None   Collection Time: 12/19/16  9:51 AM  Result Value Ref Range   Cholesterol, Total 146 100 - 199 mg/dL   Triglycerides 134 0 - 149 mg/dL   HDL 50 >39 mg/dL   VLDL Cholesterol Cal 27 5 - 40 mg/dL   LDL Calculated 69 0 - 99 mg/dL  Hgb A1c w/o eAG     Status: Abnormal   Collection Time:  12/19/16  9:51 AM  Result Value Ref Range   Hgb A1c MFr Bld 11.8 (H) 4.8 - 5.6 %    Comment:          Pre-diabetes: 5.7 - 6.4          Diabetes: >6.4          Glycemic control for adults with diabetes: <7.0   VITAMIN D 25 Hydroxy (Vit-D Deficiency, Fractures)     Status: None   Collection Time: 12/19/16  9:51 AM  Result Value Ref Range   Vit D, 25-Hydroxy 34.1 30.0 - 100.0 ng/mL    Comment: Vitamin D deficiency has been defined by the [place] and an Endocrine Society practice guideline as a level of serum 25-OH vitamin D less than 20 ng/mL (1,2). The Endocrine Society went on to further define vitamin D insufficiency as a level between 21 and 29 ng/mL (2). 1. IOM (Institute of Medicine). 2010. Dietary reference    intakes for calcium and D. [place]: The    [employer]. 2. Holick MF, Binkley [place], Bischoff-Ferrari HA, et al.    Evaluation, treatment, and prevention of vitamin D    deficiency: an Endocrine Society clinical practice    guideline. JCEM. 2011 Jul; 96(7):1911-30.   Uric acid     Status: Abnormal   Collection Time: 12/19/16  9:51 AM  Result Value Ref Range   Uric Acid 8.5 (H) 2.5 - 7.1 mg/dL    Comment:            Therapeutic target for gout patients: <6.0      PHQ2/9: Depression screen [facility] 2/9 12/30/2016 08/26/2016 04/22/2016 12/11/2015 08/08/2015  Decreased Interest 0 0 0 0 0  Down, Depressed, Hopeless 0 0 0 0 0  PHQ - 2 Score 0 0 0 0 0     Fall Risk: Fall Risk  12/30/2016 08/26/2016 04/22/2016 12/11/2015 11/01/2015  Falls in the past year? No No No No No     Functional Status Survey: Is the patient deaf or have difficulty hearing?: No Does the patient have difficulty seeing, even when wearing glasses/contacts?: No Does the patient have difficulty concentrating, remembering, or making decisions?: No Does the patient have difficulty walking or climbing stairs?: No Does the patient have difficulty dressing or bathing?: No Does the  patient have difficulty doing errands alone such as visiting a doctor's office or shopping?: No    Assessment & Plan  1. Uncontrolled type 2 diabetes mellitus with microalbuminuria, without long-term current use of insulin (HCC)  - Dulaglutide (TRULICITY) 1.5 0000000 SOPN; Inject 1.5 mg into the skin once a week.  Dispense: 4 pen; Refill: 5 - Ambulatory referral to [place] - metFORMIN (GLUCOPHAGE) 850 MG tablet; Take 1 tablet (850 mg total) by mouth 2 (two) times daily.  Dispense: 180 tablet; Refill: 1 Continue Jardiance Stop Januvia and Starlix  2. Benign hypertension  Needs to monitor bp at home, if remains low we will decrease dose of bp medication, carvedilol to 12.5 mg twice daily  - Olmesartan-Amlodipine-HCTZ 40-10-25 MG TABS; Daily  Dispense: 90 tablet; Refill: 1 -  cloNIDine (CATAPRES) 0.1 MG tablet; Take 1 tablet (0.1 mg total) by mouth at bedtime.  Dispense: 90 tablet; Refill: 1 - carvedilol (COREG) 25 MG tablet; Take 1 tablet (25 mg total) by mouth 2 (two) times daily.  Dispense: 180 tablet; Refill: 1  3. Dyslipidemia associated with type 2 diabetes mellitus ([place])  On statin therapy, discussed ways to improved HDL level   4. Morbid obesity ([place])  Lost a few pounds since last visit   5. Hypothyroidism, postablative  Continue follow up with Dr. Gabriel Carina

## 2016-12-31 ENCOUNTER — Other Ambulatory Visit: Payer: Self-pay | Admitting: Family Medicine

## 2016-12-31 NOTE — Telephone Encounter (Signed)
Patient requesting refill of Fre[facility] strips [facility]

## 2017-01-01 ENCOUNTER — Other Ambulatory Visit: Payer: Self-pay | Admitting: Family Medicine

## 2017-01-01 DIAGNOSIS — E1121 Type 2 diabetes mellitus with diabetic nephropathy: Secondary | ICD-10-CM | POA: Diagnosis not present

## 2017-01-01 DIAGNOSIS — J302 Other seasonal allergic rhinitis: Secondary | ICD-10-CM

## 2017-01-01 DIAGNOSIS — I119 Hypertensive heart disease without heart failure: Secondary | ICD-10-CM | POA: Diagnosis not present

## 2017-01-01 NOTE — Telephone Encounter (Signed)
Patient requesting [facility] of Lor[facility]adine [facility].  

## 2017-01-06 ENCOUNTER — Telehealth: Payer: Self-pay | Admitting: Family Medicine

## 2017-01-06 DIAGNOSIS — E1121 Type 2 diabetes mellitus with diabetic nephropathy: Secondary | ICD-10-CM

## 2017-01-06 NOTE — Telephone Encounter (Signed)
I don't know what is covered ( brand ) by her insurance. Please ask patient to find out and let me know what meter she would like to have called in

## 2017-01-06 NOTE — Telephone Encounter (Signed)
When checking the Medicaid approval list, since she has Medicaid and Medicare. The approved meters are: Accu-Chek Aviva, Accu-Check Compact Plus, Accu-Chek Nano and Accu-Check Guide Retail care kit along with the test strips, and lancets. Please choose switch one you like the patient to be on and [facility]

## 2017-01-06 NOTE — Telephone Encounter (Signed)
Pt requesting a glucose meter. She currently is using the freestyle lite or what ever her insurance company will pay for. The health nurse advisor who came to her home last Wednesday told her to get a new one because it is outdated. [facility]

## 2017-01-07 MED ORDER — ACCU-CHEK MULTICLIX LANCETS MISC: 12 refills | Status: DC

## 2017-01-07 MED ORDER — ACCU-CHEK AVIVA PLUS W/DEVICE KIT: 1.0000 | PACK | Freq: Three times a day (TID) | 0 refills | Status: DC

## 2017-01-07 MED ORDER — GLUCOSE BLOOD VI STRP: ORAL_STRIP | 12 refills | Status: DC

## 2017-01-08 DIAGNOSIS — E1121 Type 2 diabetes mellitus with diabetic nephropathy: Secondary | ICD-10-CM | POA: Diagnosis not present

## 2017-01-08 DIAGNOSIS — I119 Hypertensive heart disease without heart failure: Secondary | ICD-10-CM | POA: Diagnosis not present

## 2017-01-09 ENCOUNTER — Other Ambulatory Visit: Payer: Self-pay

## 2017-01-09 DIAGNOSIS — E1121 Type 2 diabetes mellitus with diabetic nephropathy: Secondary | ICD-10-CM

## 2017-01-09 MED ORDER — ACCU-CHEK AVIVA PLUS W/DEVICE KIT: 1.0000 | PACK | Freq: Three times a day (TID) | 0 refills | Status: DC

## 2017-01-09 MED ORDER — ACCU-CHEK MULTICLIX LANCETS MISC: 12 refills | Status: DC

## 2017-01-09 NOTE — Telephone Encounter (Signed)
[facility] [place] asked to be switched to [place].

## 2017-01-10 NOTE — Telephone Encounter (Signed)
Pt states that the pharmacy [facility] did not have any in stock therefore she is asking that you send the Accu-Chek Aviva to [facility] [address]

## 2017-01-10 NOTE — Telephone Encounter (Signed)
I don't need to send another prescription, she can call [facility] and ask them to request rx sent to [facility]

## 2017-01-10 NOTE — Telephone Encounter (Signed)
Have tried contacting pt twice no answer and voice mail never picked up for me to leave message

## 2017-01-15 DIAGNOSIS — I119 Hypertensive heart disease without heart failure: Secondary | ICD-10-CM | POA: Diagnosis not present

## 2017-01-15 DIAGNOSIS — E1121 Type 2 diabetes mellitus with diabetic nephropathy: Secondary | ICD-10-CM | POA: Diagnosis not present

## 2017-01-17 DIAGNOSIS — E890 Postprocedural hypothyroidism: Secondary | ICD-10-CM | POA: Diagnosis not present

## 2017-01-21 DIAGNOSIS — I119 Hypertensive heart disease without heart failure: Secondary | ICD-10-CM | POA: Diagnosis not present

## 2017-01-21 DIAGNOSIS — E1121 Type 2 diabetes mellitus with diabetic nephropathy: Secondary | ICD-10-CM | POA: Diagnosis not present

## 2017-01-24 DIAGNOSIS — E890 Postprocedural hypothyroidism: Secondary | ICD-10-CM | POA: Diagnosis not present

## 2017-01-24 DIAGNOSIS — E042 Nontoxic multinodular goiter: Secondary | ICD-10-CM | POA: Diagnosis not present

## 2017-01-29 DIAGNOSIS — E1121 Type 2 diabetes mellitus with diabetic nephropathy: Secondary | ICD-10-CM | POA: Diagnosis not present

## 2017-01-29 DIAGNOSIS — I119 Hypertensive heart disease without heart failure: Secondary | ICD-10-CM | POA: Diagnosis not present

## 2017-02-04 ENCOUNTER — Telehealth: Payer: Self-pay

## 2017-02-04 DIAGNOSIS — I119 Hypertensive heart disease without heart failure: Secondary | ICD-10-CM | POA: Diagnosis not present

## 2017-02-04 DIAGNOSIS — E1121 Type 2 diabetes mellitus with diabetic nephropathy: Secondary | ICD-10-CM | POA: Diagnosis not present

## 2017-02-04 NOTE — Telephone Encounter (Signed)
Denyse Amass from [place] called and stated the pt was done with her diabetic teaching and that pt was doing well avg. blood sugar over last 7 days 149

## 2017-02-11 DIAGNOSIS — H401131 Primary open-angle glaucoma, bilateral, mild stage: Secondary | ICD-10-CM | POA: Diagnosis not present

## 2017-03-22 ENCOUNTER — Other Ambulatory Visit: Payer: Self-pay | Admitting: Family Medicine

## 2017-04-21 DIAGNOSIS — R0609 Other forms of dyspnea: Secondary | ICD-10-CM | POA: Diagnosis not present

## 2017-04-29 ENCOUNTER — Encounter: Payer: Self-pay | Admitting: Family Medicine

## 2017-04-29 ENCOUNTER — Ambulatory Visit (INDEPENDENT_AMBULATORY_CARE_PROVIDER_SITE_OTHER): Payer: Medicare Other | Admitting: Family Medicine

## 2017-04-29 VITALS — BP 114/58 | HR 86 | Temp 97.6°F | Resp 16 | Ht 66.0 in | Wt 208.2 lb

## 2017-04-29 DIAGNOSIS — E785 Hyperlipidemia, unspecified: Secondary | ICD-10-CM | POA: Diagnosis not present

## 2017-04-29 DIAGNOSIS — E790 Hyperuricemia without signs of inflammatory arthritis and tophaceous disease: Secondary | ICD-10-CM

## 2017-04-29 DIAGNOSIS — E890 Postprocedural hypothyroidism: Secondary | ICD-10-CM | POA: Diagnosis not present

## 2017-04-29 DIAGNOSIS — E1169 Type 2 diabetes mellitus with other specified complication: Secondary | ICD-10-CM

## 2017-04-29 DIAGNOSIS — Z923 Personal history of irradiation: Secondary | ICD-10-CM

## 2017-04-29 DIAGNOSIS — E1121 Type 2 diabetes mellitus with diabetic nephropathy: Secondary | ICD-10-CM | POA: Diagnosis not present

## 2017-04-29 DIAGNOSIS — I10 Essential (primary) hypertension: Secondary | ICD-10-CM

## 2017-04-29 LAB — POCT UA - MICROALBUMIN: MICROALBUMIN (UR) POC: NEGATIVE mg/L

## 2017-04-29 LAB — POCT GLYCOSYLATED HEMOGLOBIN (HGB A1C): HEMOGLOBIN A1C: 7.4

## 2017-04-29 MED ORDER — OLMESARTAN-AMLODIPINE-HCTZ 40-5-25 MG PO TABS: 1.0000 | ORAL_TABLET | Freq: Every day | ORAL | 1 refills | Status: DC

## 2017-04-29 NOTE — Progress Notes (Signed)
Name: Susan Bowen   MRN: [id number]    DOB: 09-02-1932   Date:04/29/2017       Progress Note  Subjective  Chief Complaint  Chief Complaint  Patient presents with  . Medication Refill    4 month F/U  . Diabetes    Patient has lost 12 pounds due to eating healthier, Average BS-123 Lowest-86 Highest-202. Checks BS every other day  . Hypothyroidism  . Hypertension    Denies any symptoms  . Hyperlipidemia    HPI  DMII with microalbuminuria; she is taking medications and also aspirin, ARB and statin therapy. FSBS at home has been 86-123 Average  120's  No polyphagia, polydipsia or polyuria.  Her hgbA1C was 10.5% down to 8.2% , 7.7%  up 11.8%, she had 8 sessions with dietician and hgbA1C went down to 7.4%  to  She has been taking Metformin twice daily, Trulicity,  Jardiance and is doing well, lost  12 lbs. She has stopped eating potato chips and drinking sodas, also changed from white bread to wheat.    HTN: she has been taking medication. No chest pain or palpitation, but has been noticing dizziness when getting up at times. BP is low today,and also during eye appointment we will decrease dose of Norvasc to 5 mg and monitor  Hyperlipidemia: taking Pravastatin and denies side effects of medications, no myalgia  Morbid Obesity: she lost weight since last visit  Pulmonary nodule: seen by Dr. Raul Del, lung nodule stable and has yearly follow ups She had an echo done that showed Tricuspid regurgitation and LVH. Seen by Dr. Fletcher Anon, negative stress test.   Goiter: s/p thyroid ablation, Summer 2016. Seeing Dr. Gabriel Carina, she is on Levothyroxine 25 mcg daily .   Patient Active Problem List   Diagnosis Date Noted  . Elevated uric acid in blood 04/29/2017  . Plantar fasciitis of left foot 03/12/2016  . Hypothyroidism, postablative 01/23/2016  . Atypical chest pain 09/26/2015  . Aortic stenosis 09/26/2015  . Allergic rhinitis, seasonal 08/08/2015  . Benign hypertension 08/08/2015  .  History of pneumonia 08/08/2015  . Carpal tunnel syndrome 08/08/2015  . Cataract 08/08/2015  . Insomnia, persistent 08/08/2015  . Dyslipidemia 08/08/2015  . History of depression 08/08/2015  . Glaucoma 08/08/2015  . Controlled gout 08/08/2015  . Fever blister 08/08/2015  . Bilateral hearing loss 08/08/2015  . Hemorrhoid 08/08/2015  . H/O iron deficiency anemia 08/08/2015  . Benign neoplasm of colon 08/08/2015  . Impingement syndrome of shoulder 08/08/2015  . Osteoporosis, post-menopausal 08/08/2015  . Morbid obesity ([place]) 08/08/2015  . Generalized OA 08/08/2015  . Multinodular goiter 08/08/2015  . Asymptomatic varicose veins 08/08/2015  . Polyp of vocal cord 08/08/2015  . Wedge fracture of thoracic vertebra ([place]) 08/08/2015  . Diabetes mellitus with proteinuric diabetic nephropathy ([place]) 08/08/2015  . LVH (left ventricular hypertrophy) 08/08/2015  . Moderate tricuspid regurgitation 08/08/2015  . History of radioactive iodine thyroid ablation 08/08/2015  . Lung nodule, solitary 05/12/2014  . Chronic cough 05/11/2014  . Vitamin D deficiency 12/20/2009  . Plantar fascial fibromatosis 12/20/2009  . Personal history of fall 07/20/2008  . Cervical radiculitis 05/11/2008    Past Surgical History:  Procedure Laterality Date  . ABDOMINAL HYSTERECTOMY    . APPENDECTOMY    . CARPAL TUNNEL RELEASE    . FRACTURE SURGERY  2001   left femur  . polyp removed from vocal cord      Family History  Problem Relation Age of Onset  . Diabetes Mother   .  Hypertension Mother   . Dementia Mother   . Hypertension Son     Social History   Social History  . Marital status: Divorced    Spouse name: N/A  . Number of children: N/A  . Years of education: N/A   Occupational History  . Not on file.   Social History Main Topics  . Smoking status: Never Smoker  . Smokeless tobacco: Never Used  . Alcohol use No  . Drug use: No  . Sexual activity: Not Currently   Other Topics Concern   . Not on file   Social History Narrative  . No narrative on file     Current Outpatient Prescriptions:  .  aspirin (ASPIRIN LOW DOSE) 81 MG chewable tablet, Chew 1 tablet by mouth daily., Disp: , Rfl:  .  Blood Glucose Monitoring Suppl (ACCU-CHEK AVIVA PLUS) w/Device KIT, 1 kit by Does not apply route 3 (three) times daily., Disp: 1 kit, Rfl: 0 .  carvedilol (COREG) 25 MG tablet, Take 1 tablet (25 mg total) by mouth 2 (two) times daily., Disp: 180 tablet, Rfl: 1 .  cloNIDine (CATAPRES) 0.1 MG tablet, Take 1 tablet (0.1 mg total) by mouth at bedtime., Disp: 90 tablet, Rfl: 1 .  Cobalamine Combinations (B-12) [phone] MCG SUBL, Take 1 each by mouth daily., Disp: , Rfl:  .  diclofenac sodium (VOLTAREN) 1 % GEL, Apply 4 g topically 4 (four) times daily., Disp: 300 g, Rfl: 1 .  Dulaglutide (TRULICITY) 1.5 WI/2.0BT SOPN, Inject 1.5 mg into the skin once a week., Disp: 4 pen, Rfl: 5 .  glucose blood (ACCU-CHEK AVIVA) test strip, Use as instructed, Disp: 200 each, Rfl: 12 .  JARDIANCE 25 MG TABS tablet, TAKE 25 MG BY MOUTH DAILY., Disp: 90 tablet, Rfl: 1 .  Lancets (ACCU-CHEK MULTICLIX) lancets, Use as instructed, Disp: 204 each, Rfl: 12 .  levothyroxine (SYNTHROID, LEVOTHROID) 50 MCG tablet, Take 1 tablet by mouth daily., Disp: , Rfl:  .  loratadine (CLARITIN) 10 MG tablet, TAKE 1 TABLET (10 MG TOTAL) BY MOUTH DAILY., Disp: 90 tablet, Rfl: 1 .  meloxicam (MOBIC) 15 MG tablet, Take 1 tablet (15 mg total) by mouth daily., Disp: 60 tablet, Rfl: 1 .  metFORMIN (GLUCOPHAGE) 850 MG tablet, Take 1 tablet (850 mg total) by mouth 2 (two) times daily., Disp: 180 tablet, Rfl: 1 .  Multiple Vitamin (MULTI-VITAMINS) TABS, Take 1 tablet by mouth daily., Disp: , Rfl:  .  NONFORMULARY OR COMPOUNDED ITEM, Apply 1-2 g topically 4 (four) times daily., Disp: 120 each, Rfl: 2 .  pravastatin (PRAVACHOL) 40 MG tablet, TAKE 1 TABLET (40 MG TOTAL) BY MOUTH DAILY., Disp: 90 tablet, Rfl: 1 .  timolol (TIMOPTIC) 0.5 %  ophthalmic solution, Place 1 drop into both eyes 2 (two) times daily., Disp: , Rfl: 5 .  travoprost, benzalkonium, (TRAVATAN) 0.004 % ophthalmic solution, Apply 1 drop to eye every evening., Disp: , Rfl:  .  Vitamin D, Ergocalciferol, (DRISDOL) 50000 units CAPS capsule, TAKE ONE CAPSULE BY MOUTH ONCE A WEEK, Disp: 12 capsule, Rfl: 0 .  fluticasone (FLONASE) 50 MCG/ACT nasal spray, Place 1 spray into the nose daily., Disp: , Rfl:  .  Olmesartan-Amlodipine-HCTZ 40-5-25 MG TABS, Take 1 tablet by mouth daily., Disp: 90 tablet, Rfl: 1  Allergies  Allergen Reactions  . Augmentin [Amoxicillin-Pot Clavulanate] Itching     ROS  Constitutional: Negative for fever or weight change.  Respiratory: Negative for cough and shortness of breath.   Cardiovascular: Negative for chest  pain or palpitations.  Gastrointestinal: Negative for abdominal pain, no bowel changes.  Musculoskeletal: Positive intermittent   gait problem ( limps when she walks for a prolonged period of time) but no joint swelling.  Skin: Negative for rash.  Neurological: Negative for dizziness or headache.  No other specific complaints in a complete review of systems (except as listed in HPI above).  Objective  Vitals:   04/29/17 1211  BP: (!) 114/58  Pulse: 86  Resp: 16  Temp: 97.6 F (36.4 C)  TempSrc: Oral  SpO2: 95%  Weight: 208 lb 3.2 oz (94.4 kg)  Height: 5' 6"  (1.676 m)    Body mass index is 33.6 kg/m.  Physical Exam  Constitutional: Patient appears well-developed and well-nourished. Obese No distress.  HEENT: head atraumatic, normocephalic, pupils equal and reactive to light,  neck supple, throat within normal limits Cardiovascular: Normal rate, regular rhythm and normal heart sounds.  No murmur heard. No BLE edema. Pulmonary/Chest: Effort normal and breath sounds normal. No respiratory distress. Abdominal: Soft.  There is no tenderness. Psychiatric: Patient has a normal mood and affect. behavior is normal.  Judgment and thought content normal.    Recent Results (from the past 2160 hour(s))  POCT UA - Microalbumin     Status: Normal   Collection Time: 04/29/17 12:14 PM  Result Value Ref Range   Microalbumin Ur, POC negative mg/L   Creatinine, POC  mg/dL   Albumin/Creatinine Ratio, Urine, POC    POCT HgB A1C     Status: Abnormal   Collection Time: 04/29/17 12:15 PM  Result Value Ref Range   Hemoglobin A1C 7.4     Diabetic Foot Exam: Diabetic Foot Exam - Simple   Simple Foot Form Diabetic Foot exam was performed with the following findings:  Yes 04/29/2017  2:40 PM  Visual Inspection See comments:  Yes Sensation Testing Intact to touch and monofilament testing bilaterally:  Yes Pulse Check Posterior Tibialis and Dorsalis pulse intact bilaterally:  Yes Comments Thick toenails      PHQ2/9: Depression screen [facility] 2/9 04/29/2017 12/30/2016 08/26/2016 04/22/2016 12/11/2015  Decreased Interest 0 0 0 0 0  Down, Depressed, Hopeless 0 0 0 0 0  PHQ - 2 Score 0 0 0 0 0    Fall Risk: Fall Risk  04/29/2017 12/30/2016 08/26/2016 04/22/2016 12/11/2015  Falls in the past year? No No No No No     Functional Status Survey: Is the patient deaf or have difficulty hearing?: No Does the patient have difficulty seeing, even when wearing glasses/contacts?: No Does the patient have difficulty concentrating, remembering, or making decisions?: Yes (Short term memory is fading) Does the patient have difficulty walking or climbing stairs?: No Does the patient have difficulty dressing or bathing?: No Does the patient have difficulty doing errands alone such as visiting a doctor's office or shopping?: Yes (Does not drive)   Assessment & Plan  1. Diabetes mellitus with proteinuric diabetic nephropathy (HCC)  Doing much better, with medication and life style modification, hgbA1C down from 11.5% down to 7.4% - POCT HgB A1C - POCT UA - Microalbumin  2. Benign hypertension  BP has been towards low end of  normal, and because of her age we will decrease norvasc dose to 5 mg from 10 mg and monitor at local pharmacy, call back if bp goes up - Olmesartan-Amlodipine-HCTZ 40-5-25 MG TABS; Take 1 tablet by mouth daily.  Dispense: 90 tablet; Refill: 1  3. Dyslipidemia associated with type 2 diabetes mellitus ([place])  Taking medication, last labs were at goal  4. Morbid obesity ([place])  Discussed with the patient the risk posed by an increased BMI. Discussed importance of portion control, calorie counting and at least 150 minutes of physical activity weekly. Avoid sweet beverages and drink more water. Eat at least 6 servings of fruit and vegetables daily   5. Hypothyroidism, postablative  Seeing Dr. Gabriel Carina   6. History of radioactive iodine thyroid ablation   7. Elevated uric acid in blood  She does not want to take medications at this time

## 2017-05-13 ENCOUNTER — Other Ambulatory Visit: Payer: Self-pay | Admitting: Family Medicine

## 2017-05-13 DIAGNOSIS — R809 Proteinuria, unspecified: Principal | ICD-10-CM

## 2017-05-13 DIAGNOSIS — E1165 Type 2 diabetes mellitus with hyperglycemia: Principal | ICD-10-CM

## 2017-05-13 DIAGNOSIS — E1129 Type 2 diabetes mellitus with other diabetic kidney complication: Secondary | ICD-10-CM

## 2017-05-13 DIAGNOSIS — IMO0002 Reserved for concepts with insufficient information to code with codable children: Secondary | ICD-10-CM

## 2017-05-13 NOTE — Telephone Encounter (Signed)
Patient requesting refill of Trulicity to [facility].  

## 2017-05-29 ENCOUNTER — Other Ambulatory Visit: Payer: Self-pay | Admitting: Family Medicine

## 2017-05-29 DIAGNOSIS — E785 Hyperlipidemia, unspecified: Secondary | ICD-10-CM

## 2017-05-29 DIAGNOSIS — E1121 Type 2 diabetes mellitus with diabetic nephropathy: Secondary | ICD-10-CM

## 2017-06-16 ENCOUNTER — Other Ambulatory Visit: Payer: Self-pay | Admitting: Family Medicine

## 2017-06-16 NOTE — Telephone Encounter (Signed)
Patient requesting ref[facility] of [facility] to [facility]. 

## 2017-07-07 ENCOUNTER — Other Ambulatory Visit: Payer: Self-pay | Admitting: Family Medicine

## 2017-07-07 DIAGNOSIS — J302 Other seasonal allergic rhinitis: Secondary | ICD-10-CM

## 2017-07-08 NOTE — Telephone Encounter (Signed)
Patient requesting refill of Loratadine to [facility].

## 2017-07-23 DIAGNOSIS — E890 Postprocedural hypothyroidism: Secondary | ICD-10-CM | POA: Diagnosis not present

## 2017-07-29 ENCOUNTER — Other Ambulatory Visit: Payer: Self-pay | Admitting: Family Medicine

## 2017-07-29 DIAGNOSIS — IMO0002 Reserved for concepts with insufficient information to code with codable children: Secondary | ICD-10-CM

## 2017-07-29 DIAGNOSIS — E1129 Type 2 diabetes mellitus with other diabetic kidney complication: Secondary | ICD-10-CM

## 2017-07-29 DIAGNOSIS — R809 Proteinuria, unspecified: Principal | ICD-10-CM

## 2017-07-29 DIAGNOSIS — E1165 Type 2 diabetes mellitus with hyperglycemia: Principal | ICD-10-CM

## 2017-07-29 NOTE — Telephone Encounter (Signed)
Patient requesting refill of Trulicity to [facility].  

## 2017-07-30 DIAGNOSIS — E890 Postprocedural hypothyroidism: Secondary | ICD-10-CM | POA: Diagnosis not present

## 2017-07-30 DIAGNOSIS — E042 Nontoxic multinodular goiter: Secondary | ICD-10-CM | POA: Diagnosis not present

## 2017-08-11 DIAGNOSIS — H401131 Primary open-angle glaucoma, bilateral, mild stage: Secondary | ICD-10-CM | POA: Diagnosis not present

## 2017-08-18 DIAGNOSIS — H401131 Primary open-angle glaucoma, bilateral, mild stage: Secondary | ICD-10-CM | POA: Diagnosis not present

## 2017-08-20 ENCOUNTER — Encounter: Payer: Self-pay | Admitting: Family Medicine

## 2017-08-22 ENCOUNTER — Other Ambulatory Visit: Payer: Self-pay

## 2017-08-22 DIAGNOSIS — IMO0002 Reserved for concepts with insufficient information to code with codable children: Secondary | ICD-10-CM

## 2017-08-22 DIAGNOSIS — R809 Proteinuria, unspecified: Principal | ICD-10-CM

## 2017-08-22 DIAGNOSIS — E1165 Type 2 diabetes mellitus with hyperglycemia: Principal | ICD-10-CM

## 2017-08-22 DIAGNOSIS — E1129 Type 2 diabetes mellitus with other diabetic kidney complication: Secondary | ICD-10-CM

## 2017-08-22 NOTE — Telephone Encounter (Signed)
Request for diabetes medication: Trulicity  Last office visit pertaining to diabetes: 04/29/2017  Lab Results  Component Value Date   HGBA1C 7.4 04/29/2017      Follow up is on 08/29/2017.

## 2017-08-24 MED ORDER — DULAGLUTIDE 1.5 MG/0.5ML ~~LOC~~ SOAJ: 1.5000 mg | SUBCUTANEOUS | 0 refills | Status: DC

## 2017-08-26 DIAGNOSIS — H25042 Posterior subcapsular polar age-related cataract, left eye: Secondary | ICD-10-CM | POA: Diagnosis not present

## 2017-08-28 ENCOUNTER — Encounter: Payer: Self-pay | Admitting: *Deleted

## 2017-08-28 ENCOUNTER — Other Ambulatory Visit: Payer: Self-pay | Admitting: Family Medicine

## 2017-08-28 DIAGNOSIS — E1121 Type 2 diabetes mellitus with diabetic nephropathy: Secondary | ICD-10-CM

## 2017-08-28 NOTE — Telephone Encounter (Signed)
Request for diabetes medication. Metformin  Last office visit pertaining to diabetes: 04/29/2017  Lab Results  Component Value Date   HGBA1C 7.4 04/29/2017     Next office visit is scheduled for: 08/29/2017.

## 2017-08-29 ENCOUNTER — Encounter: Payer: Self-pay | Admitting: Family Medicine

## 2017-08-29 ENCOUNTER — Ambulatory Visit (INDEPENDENT_AMBULATORY_CARE_PROVIDER_SITE_OTHER): Payer: Medicare Other | Admitting: Family Medicine

## 2017-08-29 ENCOUNTER — Ambulatory Visit: Payer: Medicare Other

## 2017-08-29 VITALS — BP 108/52 | HR 89 | Temp 98.2°F | Resp 18 | Ht 66.0 in | Wt 206.2 lb

## 2017-08-29 DIAGNOSIS — E890 Postprocedural hypothyroidism: Secondary | ICD-10-CM | POA: Diagnosis not present

## 2017-08-29 DIAGNOSIS — E1121 Type 2 diabetes mellitus with diabetic nephropathy: Secondary | ICD-10-CM

## 2017-08-29 DIAGNOSIS — E1169 Type 2 diabetes mellitus with other specified complication: Secondary | ICD-10-CM | POA: Diagnosis not present

## 2017-08-29 DIAGNOSIS — E785 Hyperlipidemia, unspecified: Secondary | ICD-10-CM | POA: Diagnosis not present

## 2017-08-29 DIAGNOSIS — M810 Age-related osteoporosis without current pathological fracture: Secondary | ICD-10-CM

## 2017-08-29 DIAGNOSIS — I10 Essential (primary) hypertension: Secondary | ICD-10-CM | POA: Diagnosis not present

## 2017-08-29 DIAGNOSIS — E559 Vitamin D deficiency, unspecified: Secondary | ICD-10-CM | POA: Diagnosis not present

## 2017-08-29 DIAGNOSIS — Z923 Personal history of irradiation: Secondary | ICD-10-CM | POA: Diagnosis not present

## 2017-08-29 DIAGNOSIS — Z23 Encounter for immunization: Secondary | ICD-10-CM | POA: Diagnosis not present

## 2017-08-29 LAB — POCT GLYCOSYLATED HEMOGLOBIN (HGB A1C): HEMOGLOBIN A1C: 7.6

## 2017-08-29 MED ORDER — VITAMIN D (ERGOCALCIFEROL) 1.25 MG (50000 UNIT) PO CAPS: ORAL_CAPSULE | ORAL | 1 refills | Status: DC

## 2017-08-29 MED ORDER — CLONIDINE HCL 0.1 MG PO TABS: 0.1000 mg | ORAL_TABLET | Freq: Every day | ORAL | 1 refills | Status: DC

## 2017-08-29 MED ORDER — CARVEDILOL 12.5 MG PO TABS: 12.5000 mg | ORAL_TABLET | Freq: Two times a day (BID) | ORAL | 0 refills | Status: DC

## 2017-08-29 MED ORDER — CARVEDILOL 25 MG PO TABS: 25.0000 mg | ORAL_TABLET | Freq: Two times a day (BID) | ORAL | 1 refills | Status: DC

## 2017-08-29 MED ORDER — DULAGLUTIDE 1.5 MG/0.5ML ~~LOC~~ SOAJ: 1.5000 mg | SUBCUTANEOUS | 1 refills | Status: DC

## 2017-08-29 MED ORDER — METFORMIN HCL 850 MG PO TABS: 850.0000 mg | ORAL_TABLET | Freq: Two times a day (BID) | ORAL | 1 refills | Status: DC

## 2017-08-29 NOTE — Progress Notes (Signed)
Name: Susan Bowen   MRN: [id number]    DOB: 20-Feb-1932   Date:08/29/2017       Progress Note  Subjective  Chief Complaint  Chief Complaint  Patient presents with  . Medication Refill    4 month F/U  . Diabetes    Checks every other day, Low-105 Average-134 Highest-201  . Hypothyroidism  . Hypertension  . Hyperlipidemia    HPI  DMII with microalbuminuria; she is taking medications and also aspirin, ARB and statin therapy. FSBS at home has been 134-138 fasting, sometimes goes up to 200 post-prandially. No polyphagia, polydipsia or polyuria.  Her hgbA1C was 10.5% down to 8.2% ,7.7%  up 11.8%, she had 8 sessions with dietician and hgbA1C was 7.4% today up to 7.6%, she is still following a diabetic diet most of the time. She has been taking Metformin twice daily, Trulicity,  Vania Rea and is doing well, lost 2 more pounds since last visit.  HTN: she has been taking medication. No chest pain or palpitation, but has been noticing dizziness when getting up at times. BP is low again today, she is already on lower dose of Norvasc down from 10 to 5, we will stop clonidine and decrease dose of Coreg to 2.5 mg twice daily   Hyperlipidemia: taking Pravastatin and denies side effects of medications, no myalgia  Morbid Obesity: she lost weight since last visit  Pulmonary nodule: seen by Dr. Raul Del, lung nodule stable and has yearly follow ups She had an echo done that showed Tricuspid regurgitation and LVH. Seen by Dr. Fletcher Anon, negative stress test.   Goiter: s/p thyroid ablation, Summer 2016. Seeing Dr. Gabriel Carina, she is on Levothyroxine 25 mcg daily .   Patient Active Problem List   Diagnosis Date Noted  . Elevated uric acid in blood 04/29/2017  . Plantar fasciitis of left foot 03/12/2016  . Hypothyroidism, postablative 01/23/2016  . Atypical chest pain 09/26/2015  . Aortic stenosis 09/26/2015  . Allergic rhinitis, seasonal 08/08/2015  . Benign hypertension 08/08/2015  . History of  pneumonia 08/08/2015  . Carpal tunnel syndrome 08/08/2015  . Cataract 08/08/2015  . Insomnia, persistent 08/08/2015  . Dyslipidemia 08/08/2015  . History of depression 08/08/2015  . Glaucoma 08/08/2015  . Controlled gout 08/08/2015  . Fever blister 08/08/2015  . Bilateral hearing loss 08/08/2015  . Hemorrhoid 08/08/2015  . H/O iron deficiency anemia 08/08/2015  . Benign neoplasm of colon 08/08/2015  . Impingement syndrome of shoulder 08/08/2015  . Osteoporosis, post-menopausal 08/08/2015  . Morbid obesity ([place]) 08/08/2015  . Generalized OA 08/08/2015  . Multinodular goiter 08/08/2015  . Asymptomatic varicose veins 08/08/2015  . Polyp of vocal cord 08/08/2015  . Wedge fracture of thoracic vertebra ([place]) 08/08/2015  . Diabetes mellitus with proteinuric diabetic nephropathy ([place]) 08/08/2015  . LVH (left ventricular hypertrophy) 08/08/2015  . Moderate tricuspid regurgitation 08/08/2015  . History of radioactive iodine thyroid ablation 08/08/2015  . Lung nodule, solitary 05/12/2014  . Chronic cough 05/11/2014  . Vitamin D deficiency 12/20/2009  . Plantar fascial fibromatosis 12/20/2009  . Personal history of fall 07/20/2008  . Cervical radiculitis 05/11/2008    Past Surgical History:  Procedure Laterality Date  . ABDOMINAL HYSTERECTOMY    . APPENDECTOMY    . BREAST SURGERY    . CARPAL TUNNEL RELEASE    . COLON SURGERY    . FEMUR FRACTURE SURGERY Left   . FRACTURE SURGERY  2001   left femur  . HEMORROIDECTOMY    . polyp removed from vocal  cord      Family History  Problem Relation Age of Onset  . Diabetes Mother   . Hypertension Mother   . Dementia Mother   . Hypertension Son     Social History   Social History  . Marital status: Divorced    Spouse name: N/A  . Number of children: N/A  . Years of education: N/A   Occupational History  . Not on file.   Social History Main Topics  . Smoking status: Never Smoker  . Smokeless tobacco: Never Used  . Alcohol  use No  . Drug use: No  . Sexual activity: Not Currently   Other Topics Concern  . Not on file   Social History Narrative  . No narrative on file     Current Outpatient Prescriptions:  .  aspirin (ASPIRIN LOW DOSE) 81 MG chewable tablet, Chew 81 mg by mouth daily. , Disp: , Rfl:  .  Blood Glucose Monitoring Suppl (ACCU-CHEK AVIVA PLUS) w/Device KIT, 1 kit by Does not apply route 3 (three) times daily., Disp: 1 kit, Rfl: 0 .  carvedilol (COREG) 25 MG tablet, Take 1 tablet (25 mg total) by mouth 2 (two) times daily., Disp: 180 tablet, Rfl: 1 .  cloNIDine (CATAPRES) 0.1 MG tablet, Take 1 tablet (0.1 mg total) by mouth at bedtime., Disp: 90 tablet, Rfl: 1 .  Cobalamine Combinations (B-12) [phone] MCG SUBL, Take 1 tablet by mouth daily. , Disp: , Rfl:  .  diclofenac sodium (VOLTAREN) 1 % GEL, Apply 4 g topically 4 (four) times daily. (Patient taking differently: Apply 4 g topically 4 (four) times daily as needed (for pain). ), Disp: 300 g, Rfl: 1 .  [START ON 09/02/2017] Dulaglutide (TRULICITY) 1.5 YS/0.6TK SOPN, Inject 1.5 mg into the skin every Tuesday., Disp: 12 pen, Rfl: 1 .  glucose blood (ACCU-CHEK AVIVA) test strip, Use as instructed, Disp: 200 each, Rfl: 12 .  JARDIANCE 25 MG TABS tablet, TAKE 25 MG BY MOUTH DAILY., Disp: 90 tablet, Rfl: 1 .  Lancets (ACCU-CHEK MULTICLIX) lancets, Use as instructed, Disp: 204 each, Rfl: 12 .  levothyroxine (SYNTHROID, LEVOTHROID) 50 MCG tablet, Take 50 mcg by mouth daily before breakfast. , Disp: , Rfl:  .  loratadine (CLARITIN) 10 MG tablet, TAKE 1 TABLET (10 MG TOTAL) BY MOUTH DAILY., Disp: 90 tablet, Rfl: 1 .  metFORMIN (GLUCOPHAGE) 850 MG tablet, Take 1 tablet (850 mg total) by mouth 2 (two) times daily., Disp: 180 tablet, Rfl: 1 .  NONFORMULARY OR COMPOUNDED ITEM, Apply 1-2 g topically 4 (four) times daily., Disp: 120 each, Rfl: 2 .  Olmesartan-Amlodipine-HCTZ 40-5-25 MG TABS, Take 1 tablet by mouth daily., Disp: 90 tablet, Rfl: 1 .  pravastatin  (PRAVACHOL) 40 MG tablet, TAKE 1 TABLET (40 MG TOTAL) BY MOUTH DAILY. (Patient taking differently: TAKE 1 TABLET (40 MG TOTAL) BY MOUTH AT BEDTIME), Disp: 90 tablet, Rfl: 1 .  timolol (TIMOPTIC) 0.5 % ophthalmic solution, Place 1 drop into both eyes 2 (two) times daily., Disp: , Rfl: 5 .  travoprost, benzalkonium, (TRAVATAN) 0.004 % ophthalmic solution, Place 1 drop into both eyes at bedtime. , Disp: , Rfl:  .  Vitamin D, Ergocalciferol, (DRISDOL) 50000 units CAPS capsule, TAKE 16010 UNITS BY MOUTH ONCE A WEEK ON WEDNESDAY, Disp: 12 capsule, Rfl: 1  Allergies  Allergen Reactions  . Augmentin [Amoxicillin-Pot Clavulanate] Itching     ROS  Constitutional: Negative for fever or weight change.  Respiratory: Negative for cough and shortness of breath.  Cardiovascular: Negative for chest pain or palpitations.  Gastrointestinal: Negative for abdominal pain, no bowel changes.  Musculoskeletal: Negative for gait problem or joint swelling.  Skin: Negative for rash.  Neurological: Negative for dizziness or headache.  No other specific complaints in a complete review of systems (except as listed in HPI above).  Objective  Vitals:   08/29/17 1057  BP: (!) 108/52  Pulse: 89  Resp: 18  Temp: 98.2 F (36.8 C)  TempSrc: Oral  SpO2: 94%  Weight: 206 lb 3.2 oz (93.5 kg)  Height: _0  (1.676 m)    Body mass index is 33.28 kg/m.  Physical Exam  Constitutional: Patient appears well-developed and well-nourished. Obese  No distress.  HEENT: head atraumatic, normocephalic, pupils equal and reactive to light,  neck supple, throat within normal limits Cardiovascular: Normal rate, regular rhythm and normal heart sounds.  No murmur heard. No BLE edema. Pulmonary/Chest: Effort normal and breath sounds normal. No respiratory distress. Abdominal: Soft.  There is no tenderness. Psychiatric: Patient has a normal mood and affect. behavior is normal. Judgment and thought content normal.  Recent  Results (from the past 2160 hour(s))  POCT HgB A1C     Status: None   Collection Time: 08/29/17 11:10 AM  Result Value Ref Range   Hemoglobin A1C 7.6      PHQ2/9: Depression screen [facility] 2/9 08/29/2017 04/29/2017 12/30/2016 08/26/2016 04/22/2016  Decreased Interest 0 0 0 0 0  Down, Depressed, Hopeless 0 0 0 0 0  PHQ - 2 Score 0 0 0 0 0    Fall Risk: Fall Risk  08/29/2017 04/29/2017 12/30/2016 08/26/2016 04/22/2016  Falls in the past year? _1     Functional Status Survey: Is the patient deaf or have difficulty hearing?: Yes Does the patient have difficulty seeing, even when wearing glasses/contacts?: No Does the patient have difficulty concentrating, remembering, or making decisions?: No Does the patient have difficulty walking or climbing stairs?: No Does the patient have difficulty dressing or bathing?: No Does the patient have difficulty doing errands alone such as visiting a doctor's office or shopping?: Yes   Assessment & Plan  1. Diabetes mellitus with proteinuric diabetic nephropathy (HCC)  We will continue current regiment, follow diet, hgbA1C is at goal for her between 7-8 - POCT HgB A1C - Dulaglutide (TRULICITY) 1.5 LG/9.2JJ SOPN; Inject 1.5 mg into the skin every Tuesday.  Dispense: 12 pen; Refill: 1 - metFORMIN (GLUCOPHAGE) 850 MG tablet; Take 1 tablet (850 mg total) by mouth 2 (two) times daily.  Dispense: 180 tablet; Refill: 1  2. Need for immunization against influenza  - Flu vaccine HIGH DOSE PF (Fluzone High dose)  3. Benign hypertension  - carvedilol (COREG) 12.5 MG tablet; Take 1 tablet (12.5 mg total) by mouth 2 (two) times daily.  Dispense: 180 tablet; Refill: 1 Stop taking Clonidine qhs since bp is low   4. Dyslipidemia associated with type 2 diabetes mellitus ([place])   5. Morbid obesity ([place])  Discussed with the patient the risk posed by an increased BMI. Discussed importance of portion control, calorie counting and at least 150 minutes of  physical activity weekly. Avoid sweet beverages and drink more water. Eat at least 6 servings of fruit and vegetables daily   6. Hypothyroidism, postablative  Taking medication as prescribed  7. History of radioactive iodine thyroid ablation  Keep follow up with Dr. Gabriel Carina  8. Vitamin D deficiency  - Vitamin D, Ergocalciferol, (DRISDOL) 50000 units CAPS capsule; TAKE 94174  UNITS BY MOUTH ONCE A WEEK ON WEDNESDAY  Dispense: 12 capsule; Refill: 1  9. Osteoporosis, post-menopausal  - DG Bone Density; Future

## 2017-09-03 ENCOUNTER — Ambulatory Visit
Admission: RE | Admit: 2017-09-03 | Discharge: 2017-09-03 | Disposition: A | Discharge status: Home / Self Care | Payer: Medicare Other | Source: Ambulatory Visit | Attending: Ophthalmology | Admitting: Ophthalmology

## 2017-09-03 ENCOUNTER — Ambulatory Visit: Payer: Medicare Other | Admitting: Anesthesiology

## 2017-09-03 ENCOUNTER — Encounter
Admission: RE | Disposition: A | Discharge status: Home / Self Care | Payer: Self-pay | Source: Ambulatory Visit | Attending: Ophthalmology

## 2017-09-03 DIAGNOSIS — M1990 Unspecified osteoarthritis, unspecified site: Secondary | ICD-10-CM | POA: Insufficient documentation

## 2017-09-03 DIAGNOSIS — H2512 Age-related nuclear cataract, left eye: Secondary | ICD-10-CM | POA: Diagnosis not present

## 2017-09-03 DIAGNOSIS — I10 Essential (primary) hypertension: Secondary | ICD-10-CM | POA: Diagnosis not present

## 2017-09-03 DIAGNOSIS — M109 Gout, unspecified: Secondary | ICD-10-CM | POA: Insufficient documentation

## 2017-09-03 DIAGNOSIS — G4700 Insomnia, unspecified: Secondary | ICD-10-CM | POA: Diagnosis not present

## 2017-09-03 DIAGNOSIS — H25042 Posterior subcapsular polar age-related cataract, left eye: Secondary | ICD-10-CM | POA: Diagnosis not present

## 2017-09-03 DIAGNOSIS — E785 Hyperlipidemia, unspecified: Secondary | ICD-10-CM | POA: Diagnosis not present

## 2017-09-03 DIAGNOSIS — E1136 Type 2 diabetes mellitus with diabetic cataract: Secondary | ICD-10-CM | POA: Diagnosis not present

## 2017-09-03 DIAGNOSIS — E039 Hypothyroidism, unspecified: Secondary | ICD-10-CM | POA: Insufficient documentation

## 2017-09-03 DIAGNOSIS — E559 Vitamin D deficiency, unspecified: Secondary | ICD-10-CM | POA: Diagnosis not present

## 2017-09-03 DIAGNOSIS — E119 Type 2 diabetes mellitus without complications: Secondary | ICD-10-CM | POA: Diagnosis not present

## 2017-09-03 DIAGNOSIS — R809 Proteinuria, unspecified: Secondary | ICD-10-CM | POA: Diagnosis not present

## 2017-09-03 DIAGNOSIS — M810 Age-related osteoporosis without current pathological fracture: Secondary | ICD-10-CM | POA: Diagnosis not present

## 2017-09-03 HISTORY — DX: Hypothyroidism, unspecified: E03.9

## 2017-09-03 HISTORY — PX: EYE SURGERY: SHX253

## 2017-09-03 HISTORY — PX: CATARACT EXTRACTION W/PHACO: SHX586

## 2017-09-03 LAB — GLUCOSE, CAPILLARY: Glucose-Capillary: 194 mg/dL — ABNORMAL HIGH (ref 65–99)

## 2017-09-03 SURGERY — PHACOEMULSIFICATION, CATARACT, WITH IOL INSERTION
Anesthesia: Monitor Anesthesia Care | Site: Eye | Laterality: Left | Wound class: Clean

## 2017-09-03 MED ORDER — MOXIFLOXACIN HCL 0.5 % OP SOLN
OPHTHALMIC | Status: AC
  Filled 2017-09-03: qty 3

## 2017-09-03 MED ORDER — NA CHONDROIT SULF-NA HYALURON 40-17 MG/ML IO SOLN
INTRAOCULAR | Status: AC
  Filled 2017-09-03: qty 1

## 2017-09-03 MED ORDER — EPINEPHRINE PF 1 MG/ML IJ SOLN
INTRAOCULAR | Status: DC | PRN
  Administered 2017-09-03: 08:00:00 via OPHTHALMIC

## 2017-09-03 MED ORDER — MIDAZOLAM HCL 2 MG/2ML IJ SOLN
INTRAMUSCULAR | Status: DC | PRN
  Administered 2017-09-03: 2 mg via INTRAVENOUS

## 2017-09-03 MED ORDER — POVIDONE-IODINE 5 % OP SOLN
OPHTHALMIC | Status: AC
  Filled 2017-09-03: qty 30

## 2017-09-03 MED ORDER — LIDOCAINE HCL (PF) 4 % IJ SOLN
INTRAOCULAR | Status: DC | PRN
  Administered 2017-09-03: 4 mL via OPHTHALMIC

## 2017-09-03 MED ORDER — ARMC OPHTHALMIC DILATING DROPS
OPHTHALMIC | Status: AC
  Administered 2017-09-03: 1 via OPHTHALMIC
  Filled 2017-09-03: qty 0.4

## 2017-09-03 MED ORDER — POVIDONE-IODINE 5 % OP SOLN
OPHTHALMIC | Status: DC | PRN
  Administered 2017-09-03: 1 via OPHTHALMIC

## 2017-09-03 MED ORDER — MOXIFLOXACIN HCL 0.5 % OP SOLN
OPHTHALMIC | Status: DC | PRN
  Administered 2017-09-03: 0.2 mL via OPHTHALMIC

## 2017-09-03 MED ORDER — MIDAZOLAM HCL 2 MG/2ML IJ SOLN
INTRAMUSCULAR | Status: AC
  Filled 2017-09-03: qty 2

## 2017-09-03 MED ORDER — MOXIFLOXACIN HCL 0.5 % OP SOLN: 1.0000 [drp] | OPHTHALMIC | Status: DC | PRN

## 2017-09-03 MED ORDER — SODIUM CHLORIDE 0.9 % IV SOLN
INTRAVENOUS | Status: DC
  Administered 2017-09-03: 07:00:00 via INTRAVENOUS

## 2017-09-03 MED ORDER — NA CHONDROIT SULF-NA HYALURON 40-17 MG/ML IO SOLN
INTRAOCULAR | Status: DC | PRN
Start: 2017-09-03 — End: 2017-09-03
  Administered 2017-09-03: 1 mL via INTRAOCULAR

## 2017-09-03 MED ORDER — LIDOCAINE HCL (PF) 4 % IJ SOLN
INTRAMUSCULAR | Status: AC
  Filled 2017-09-03: qty 5

## 2017-09-03 MED ORDER — FENTANYL CITRATE (PF) 100 MCG/2ML IJ SOLN
INTRAMUSCULAR | Status: AC
  Filled 2017-09-03: qty 2

## 2017-09-03 MED ORDER — EPINEPHRINE PF 1 MG/ML IJ SOLN
INTRAMUSCULAR | Status: AC
  Filled 2017-09-03: qty 2

## 2017-09-03 MED ORDER — ARMC OPHTHALMIC DILATING DROPS
1.0000 "application " | OPHTHALMIC | Status: AC
  Administered 2017-09-03 (×3): 1 via OPHTHALMIC

## 2017-09-03 MED ORDER — CARBACHOL 0.01 % IO SOLN
INTRAOCULAR | Status: DC | PRN
  Administered 2017-09-03: 0.5 mL via INTRAOCULAR

## 2017-09-03 MED ORDER — FENTANYL CITRATE (PF) 100 MCG/2ML IJ SOLN
INTRAMUSCULAR | Status: DC | PRN
  Administered 2017-09-03: 25 ug via INTRAVENOUS

## 2017-09-03 SURGICAL SUPPLY — 16 items
GLOVE BIO SURGEON STRL SZ8 (GLOVE) ×3 IMPLANT
GLOVE BIOGEL M 6.5 STRL (GLOVE) ×3 IMPLANT
GLOVE SURG LX 8.0 MICRO (GLOVE) ×2
GLOVE SURG LX STRL 8.0 MICRO (GLOVE) ×1 IMPLANT
GOWN STRL REUS W/ TWL LRG LVL3 (GOWN DISPOSABLE) ×2 IMPLANT
GOWN STRL REUS W/TWL LRG LVL3 (GOWN DISPOSABLE) ×4
LABEL CATARACT MEDS ST (LABEL) ×3 IMPLANT
LENS IOL TECNIS ITEC 21.5 (Intraocular Lens) ×3 IMPLANT
PACK CATARACT (MISCELLANEOUS) ×3 IMPLANT
PACK CATARACT BRASINGTON LX (MISCELLANEOUS) ×3 IMPLANT
PACK EYE AFTER SURG (MISCELLANEOUS) ×3 IMPLANT
SOL BSS BAG (MISCELLANEOUS) ×3
SOLUTION BSS BAG (MISCELLANEOUS) ×1 IMPLANT
SYR 5ML LL (SYRINGE) ×3 IMPLANT
WATER STERILE IRR 250ML POUR (IV SOLUTION) ×3 IMPLANT
WIPE NON LINTING 3.25X3.25 (MISCELLANEOUS) ×3 IMPLANT

## 2017-09-03 NOTE — Anesthesia Procedure Notes (Signed)
Procedure Name: MAC Date/Time: 09/03/2017 8:27 AM Performed by: Nelda Marseille Pre-anesthesia Checklist: Patient identified, Emergency Drugs available, Suction available, Patient being monitored and Timeout performed Oxygen Delivery Method: Nasal cannula

## 2017-09-03 NOTE — Op Note (Signed)
PREOPERATIVE DIAGNOSIS:  Nuclear sclerotic cataract of the left eye.   POSTOPERATIVE DIAGNOSIS:  Nuclear sclerotic cataract of the left eye.   OPERATIVE PROCEDURE: Procedure(s): CATARACT EXTRACTION PHACO AND INTRAOCULAR LENS PLACEMENT (IOC)-LEFT DIABETIC   SURGEON:  Birder Robson, MD.   ANESTHESIA:  Anesthesiologist: Piscitello, Precious Haws, MD CRNA: Nelda Marseille, CRNA  1.      Managed anesthesia care. 2.     0.34ml of Shugarcaine was instilled following the paracentesis   COMPLICATIONS:  None.   TECHNIQUE:   Stop and chop   DESCRIPTION OF PROCEDURE:  The patient was examined and consented in the preoperative holding area where the aforementioned topical anesthesia was applied to the left eye and then brought back to the Operating Room where the left eye was prepped and draped in the usual sterile ophthalmic fashion and a lid speculum was placed. A paracentesis was created with the side port blade and the anterior chamber was filled with viscoelastic. A near clear corneal incision was performed with the steel keratome. A continuous curvilinear capsulorrhexis was performed with a cystotome followed by the capsulorrhexis forceps. Hydrodissection and hydrodelineation were carried out with BSS on a blunt cannula. The lens was removed in a stop and chop  technique and the remaining cortical material was removed with the irrigation-aspiration handpiece. The capsular bag was inflated with viscoelastic and the Technis [id number] lens was placed in the capsular bag without complication. The remaining viscoelastic was removed from the eye with the irrigation-aspiration handpiece. The wounds were hydrated. The anterior chamber was flushed with Miostat and the eye was inflated to physiologic pressure. 0.71ml Vigamox was placed in the anterior chamber. The wounds were found to be water tight. The eye was dressed with Vigamox. The patient was given protective glasses to wear throughout the day and a shield with which  to sleep tonight. The patient was also given drops with which to begin a drop regimen today and will follow-up with me in one day.  Implant Name Type Inv. Item Serial No. Manufacturer Lot No. LRB No. Used  LENS IOL DIOP 21.5 - [id number] Intraocular Lens LENS IOL DIOP 21.5 [id number] AMO   Left 1    Procedure(s) with comments: CATARACT EXTRACTION PHACO AND INTRAOCULAR LENS PLACEMENT (IOC)-LEFT DIABETIC (Left) - [place] 01:10.2 AP% 16.7 CDE 11.71 Fluid Pack Lot # [id number]  Electronically signed: [place] 09/03/2017 8:45 AM

## 2017-09-03 NOTE — Anesthesia Preprocedure Evaluation (Signed)
Anesthesia Evaluation  Patient identified by MRN, date of birth, ID band Patient awake    Reviewed: Allergy & Precautions, H&P , NPO status , Patient's Chart, lab work & pertinent test results  History of Anesthesia Complications Negative for: history of anesthetic complications  Airway Mallampati: III  TM Distance: <3 FB Neck ROM: limited    Dental  (+) Chipped, Poor Dentition, Missing   Pulmonary neg pulmonary ROS, neg shortness of breath,           Cardiovascular Exercise Tolerance: Good hypertension, (-) angina(-) Past MI and (-) DOE + Valvular Problems/Murmurs      Neuro/Psych TIA Neuromuscular disease negative psych ROS   GI/Hepatic negative GI ROS, Neg liver ROS,   Endo/Other  diabetes, Type 2Hypothyroidism   Renal/GU Renal disease     Musculoskeletal  (+) Arthritis ,   Abdominal   Peds  Hematology negative hematology ROS (+)   Anesthesia Other Findings Past Medical History: No date: Allergy No date: Bunion No date: Carpal tunnel syndrome No date: Cataracta No date: Diabetes mellitus without complication (HCC) No date: Generalized osteoarthritis No date: Gout No date: Heart murmur No date: Hemorrhoids without complication No date: Hyperlipidemia No date: Hypertension No date: Hypothyroidism No date: Impingement syndrome of right shoulder No date: Insomnia No date: Lipoma No date: Lung nodule No date: Mini stroke (HCC) No date: Neuritis or radiculitis due to rupture of lumbar  intervertebral disc No date: Obesity No date: Osteoporosis No date: Proteinuria No date: Rotator cuff tear No date: Tinnitus of both ears No date: Unspecified glaucoma(365.9) No date: Vitamin D deficiency No date: Wedge compression fracture of t11-T12 vertebra, sequela  Past Surgical History: No date: ABDOMINAL HYSTERECTOMY No date: APPENDECTOMY No date: BREAST SURGERY No date: CARPAL TUNNEL RELEASE No date: COLON  SURGERY No date: FEMUR FRACTURE SURGERY; Left 2001: FRACTURE SURGERY     Comment:  left femur No date: HEMORROIDECTOMY No date: polyp removed from vocal cord  BMI    Body Mass Index:  33.25 kg/m      Reproductive/Obstetrics negative OB ROS                             Anesthesia Physical Anesthesia Plan  ASA: III  Anesthesia Plan: MAC   Post-op Pain Management:    Induction: Intravenous  PONV Risk Score and Plan:   Airway Management Planned: Natural Airway and Nasal Cannula  Additional Equipment:   Intra-op Plan:   Post-operative Plan:   Informed Consent: I have reviewed the patients History and Physical, chart, labs and discussed the procedure including the risks, benefits and alternatives for the proposed anesthesia with the patient or authorized representative who has indicated his/her understanding and acceptance.   Dental Advisory Given  Plan Discussed with: Anesthesiologist, CRNA and Surgeon  Anesthesia Plan Comments: (Patient consented for risks of anesthesia including but not limited to:  - adverse reactions to medications - damage to teeth, lips or other oral mucosa - sore throat or hoarseness - Damage to heart, brain, lungs or loss of life  Patient voiced understanding.)        Anesthesia Quick Evaluation

## 2017-09-03 NOTE — H&P (Signed)
All labs reviewed. Abnormal studies sent to patients PCP when indicated.  Previous H&P reviewed, patient examined, there are NO CHANGES.  Susan Strader LOUIS10/31/20188:17 AM

## 2017-09-03 NOTE — Discharge Instructions (Signed)
Eye Surgery Discharge Instructions  Expect mild scratchy sensation or mild soreness. DO NOT RUB YOUR EYE!  The day of surgery:  Minimal physical activity, but bed rest is not required  No reading, computer work, or close hand work  No bending, lifting, or straining.  May watch TV  For 24 hours:  No driving, legal decisions, or alcoholic beverages  Safety precautions  Eat anything you prefer: It is better to start with liquids, then soup then solid foods.  _____ Eye patch should be worn until postoperative exam tomorrow.  ____ Solar shield eyeglasses should be worn for comfort in the sunlight/patch while sleeping  Resume all regular medications including aspirin or Coumadin if these were discontinued prior to surgery. You may shower, bathe, shave, or wash your hair. Tylenol may be taken for mild discomfort.  Call your doctor if you experience significant pain, nausea, or vomiting, fever > 101 or other signs of infection. [phone] or [phone] Specific instructions:  Follow-up Information    Birder Robson, MD Follow up.   Specialty:  Ophthalmology Why:  November 1 at 10:55am Contact information: [address] [place] [address] [phone]

## 2017-09-03 NOTE — Transfer of Care (Signed)
Immediate Anesthesia Transfer of Care Note  Patient: Susan Bowen  Procedure(s) Performed: CATARACT EXTRACTION PHACO AND INTRAOCULAR LENS PLACEMENT (IOC)-LEFT DIABETIC (Left Eye)  Patient Location: PACU  Anesthesia Type:MAC  Level of Consciousness: awake  Airway & Oxygen Therapy: Patient Spontanous Breathing  Post-op Assessment: Report given to RN and Post -op Vital signs reviewed and stable  Post vital signs: Reviewed and stable  Last Vitals:  Vitals:   08/28/17 1039 09/03/17 0713  BP: 121/61 (!) 159/75  Pulse: 68 69  Resp:  18  Temp:  (!) 35.8 C  SpO2:  99%    Last Pain:  Vitals:   09/03/17 0713  TempSrc: Oral  PainSc: 9          Complications: No apparent anesthesia complications

## 2017-09-03 NOTE — Addendum Note (Signed)
Addendum  created 09/03/17 0936 by Piscitello, Precious Haws, MD   Anesthesia Attestations filed

## 2017-09-03 NOTE — Anesthesia Postprocedure Evaluation (Signed)
Anesthesia Post Note  Patient: Susan Bowen  Procedure(s) Performed: CATARACT EXTRACTION PHACO AND INTRAOCULAR LENS PLACEMENT (IOC)-LEFT DIABETIC (Left Eye)  Patient location during evaluation: PACU Anesthesia Type: MAC Level of consciousness: awake, awake and alert and oriented Pain management: pain level controlled Vital Signs Assessment: post-procedure vital signs reviewed and stable Respiratory status: spontaneous breathing and nonlabored ventilation Cardiovascular status: stable Postop Assessment: no headache Anesthetic complications: no     Last Vitals:  Vitals:   08/28/17 1039 09/03/17 0713  BP: 121/61 (!) 159/75  Pulse: 68 69  Resp:  18  Temp:  (!) 35.8 C  SpO2:  99%    Last Pain:  Vitals:   09/03/17 0713  TempSrc: Oral  PainSc: 9                  Jerrard Bradburn,  Chasta Deshpande R

## 2017-09-03 NOTE — Anesthesia Post-op Follow-up Note (Signed)
Anesthesia QCDR form completed.        

## 2017-09-05 ENCOUNTER — Emergency Department
Admission: EM | Admit: 2017-09-05 | Discharge: 2017-09-05 | Disposition: A | Discharge status: Home / Self Care | Payer: Medicare Other | Attending: Emergency Medicine | Admitting: Emergency Medicine

## 2017-09-05 ENCOUNTER — Emergency Department: Payer: Medicare Other

## 2017-09-05 ENCOUNTER — Encounter: Payer: Self-pay | Admitting: Emergency Medicine

## 2017-09-05 DIAGNOSIS — M62830 Muscle spasm of back: Secondary | ICD-10-CM

## 2017-09-05 DIAGNOSIS — X58XXXA Exposure to other specified factors, initial encounter: Secondary | ICD-10-CM | POA: Diagnosis not present

## 2017-09-05 DIAGNOSIS — Z7984 Long term (current) use of oral hypoglycemic drugs: Secondary | ICD-10-CM | POA: Insufficient documentation

## 2017-09-05 DIAGNOSIS — Y939 Activity, unspecified: Secondary | ICD-10-CM | POA: Diagnosis not present

## 2017-09-05 DIAGNOSIS — E1121 Type 2 diabetes mellitus with diabetic nephropathy: Secondary | ICD-10-CM | POA: Diagnosis not present

## 2017-09-05 DIAGNOSIS — Z7982 Long term (current) use of aspirin: Secondary | ICD-10-CM | POA: Diagnosis not present

## 2017-09-05 DIAGNOSIS — Z79899 Other long term (current) drug therapy: Secondary | ICD-10-CM | POA: Insufficient documentation

## 2017-09-05 DIAGNOSIS — E039 Hypothyroidism, unspecified: Secondary | ICD-10-CM | POA: Insufficient documentation

## 2017-09-05 DIAGNOSIS — S39012A Strain of muscle, fascia and tendon of lower back, initial encounter: Secondary | ICD-10-CM

## 2017-09-05 DIAGNOSIS — M545 Low back pain: Secondary | ICD-10-CM | POA: Diagnosis not present

## 2017-09-05 DIAGNOSIS — Y999 Unspecified external cause status: Secondary | ICD-10-CM | POA: Diagnosis not present

## 2017-09-05 DIAGNOSIS — Y929 Unspecified place or not applicable: Secondary | ICD-10-CM | POA: Diagnosis not present

## 2017-09-05 DIAGNOSIS — I10 Essential (primary) hypertension: Secondary | ICD-10-CM | POA: Diagnosis not present

## 2017-09-05 DIAGNOSIS — S3992XA Unspecified injury of lower back, initial encounter: Secondary | ICD-10-CM | POA: Diagnosis present

## 2017-09-05 MED ORDER — TRAMADOL-ACETAMINOPHEN 37.5-325 MG PO TABS: 1.0000 | ORAL_TABLET | Freq: Two times a day (BID) | ORAL | 0 refills | Status: DC

## 2017-09-05 MED ORDER — IBUPROFEN 600 MG PO TABS
600.0000 mg | ORAL_TABLET | Freq: Once | ORAL | Status: AC
  Administered 2017-09-05: 600 mg via ORAL
  Filled 2017-09-05: qty 1

## 2017-09-05 MED ORDER — CYCLOBENZAPRINE HCL 10 MG PO TABS: 5.0000 mg | ORAL_TABLET | Freq: Two times a day (BID) | ORAL | 0 refills | Status: DC

## 2017-09-05 MED ORDER — CYCLOBENZAPRINE HCL 10 MG PO TABS
10.0000 mg | ORAL_TABLET | Freq: Once | ORAL | Status: AC
  Administered 2017-09-05: 10 mg via ORAL
  Filled 2017-09-05: qty 1

## 2017-09-05 NOTE — ED Provider Notes (Signed)
[facility] Emergency Department Provider Note   ____________________________________________   First MD Initiated Contact with Patient 09/05/17 1457     (approximate)  I have reviewed the triage vital signs and the nursing notes.   HISTORY  Chief Complaint Back Pain    HPI Susan Bowen is a 83 y.o. female patient complaining of 4 days of low back pain. Patient denies provocative incident.. Patient denies radicular component to her pain. Patient denies bladder or bowel dysfunction. Patient described the pain "muscle spasm". Patient state she felt like her back "gave out" 4 days ago. Patient rates the pain as a 10 over 10. Patient described a pain as "sharp/spasmatic". No palliative measures for complaint. Past Medical History:  Diagnosis Date  . Allergy   . Bunion   . Carpal tunnel syndrome   . Cataracta   . Diabetes mellitus without complication ([place])   . Generalized osteoarthritis   . Gout   . Heart murmur   . Hemorrhoids without complication   . Hyperlipidemia   . Hypertension   . Hypothyroidism   . Impingement syndrome of right shoulder   . Insomnia   . Lipoma   . Lung nodule   . Mini stroke ([place])   . Neuritis or radiculitis due to rupture of lumbar intervertebral disc   . Obesity   . Osteoporosis   . Proteinuria   . Rotator cuff tear   . Tinnitus of both ears   . Unspecified glaucoma(365.9)   . Vitamin D deficiency   . Wedge compression fracture of t11-T12 vertebra, sequela     Patient Active Problem List   Diagnosis Date Noted  . Elevated uric acid in blood 04/29/2017  . Plantar fasciitis of left foot 03/12/2016  . Hypothyroidism, postablative 01/23/2016  . Atypical chest pain 09/26/2015  . Aortic stenosis 09/26/2015  . Allergic rhinitis, seasonal 08/08/2015  . Benign hypertension 08/08/2015  . History of pneumonia 08/08/2015  . Carpal tunnel syndrome 08/08/2015  . Cataract 08/08/2015  . Insomnia, persistent 08/08/2015    . Dyslipidemia 08/08/2015  . History of depression 08/08/2015  . Glaucoma 08/08/2015  . Controlled gout 08/08/2015  . Fever blister 08/08/2015  . Bilateral hearing loss 08/08/2015  . Hemorrhoid 08/08/2015  . H/O iron deficiency anemia 08/08/2015  . Benign neoplasm of colon 08/08/2015  . Impingement syndrome of shoulder 08/08/2015  . Osteoporosis, post-menopausal 08/08/2015  . Morbid obesity ([place]) 08/08/2015  . Generalized OA 08/08/2015  . Multinodular goiter 08/08/2015  . Asymptomatic varicose veins 08/08/2015  . Polyp of vocal cord 08/08/2015  . Wedge fracture of thoracic vertebra ([place]) 08/08/2015  . Diabetes mellitus with proteinuric diabetic nephropathy ([place]) 08/08/2015  . LVH (left ventricular hypertrophy) 08/08/2015  . Moderate tricuspid regurgitation 08/08/2015  . History of radioactive iodine thyroid ablation 08/08/2015  . Lung nodule, solitary 05/12/2014  . Chronic cough 05/11/2014  . Vitamin D deficiency 12/20/2009  . Plantar fascial fibromatosis 12/20/2009  . Personal history of fall 07/20/2008  . Cervical radiculitis 05/11/2008    Past Surgical History:  Procedure Laterality Date  . ABDOMINAL HYSTERECTOMY    . APPENDECTOMY    . BREAST SURGERY    . CARPAL TUNNEL RELEASE    . CATARACT EXTRACTION W/PHACO Left 09/03/2017   Procedure: CATARACT EXTRACTION PHACO AND INTRAOCULAR LENS PLACEMENT (IOC)-LEFT DIABETIC;  Surgeon: Birder Robson, MD;  Location: [facility] ORS;  Service: Ophthalmology;  Laterality: Left;  [place] 01:10.2 AP% 16.7 CDE 11.71 Fluid Pack Lot # [id number]  . COLON SURGERY    .  EYE SURGERY    . FEMUR FRACTURE SURGERY Left   . FRACTURE SURGERY  2001   left femur  . HEMORROIDECTOMY    . polyp removed from vocal cord      Prior to Admission medications   Medication Sig Start Date End Date Taking? Authorizing Provider  aspirin (ASPIRIN LOW DOSE) 81 MG chewable tablet Chew 81 mg by mouth daily.  11/26/12   [provider]  Blood Glucose Monitoring  Suppl (ACCU-CHEK AVIVA PLUS) w/Device KIT 1 kit by Does not apply route 3 (three) times daily. 01/09/17   Steele Sizer, MD  carvedilol (COREG) 12.5 MG tablet Take 1 tablet (12.5 mg total) by mouth 2 (two) times daily. 08/29/17   Steele Sizer, MD  cloNIDine (CATAPRES) 0.1 MG tablet Take 1 tablet (0.1 mg total) by mouth at bedtime. Patient not taking: Reported on 09/03/2017 08/29/17   Steele Sizer, MD  Cobalamine Combinations (B-12) [phone] MCG SUBL Take 1 tablet by mouth daily.     [provider]  cyclobenzaprine (FLEXERIL) 10 MG tablet Take 0.5 tablets (5 mg total) by mouth 2 (two) times daily before a meal. 09/05/17   Sable Feil, PA-C  diclofenac sodium (VOLTAREN) 1 % GEL Apply 4 g topically 4 (four) times daily. Patient taking differently: Apply 4 g topically 4 (four) times daily as needed (for pain).  08/26/16   Steele Sizer, MD  Dulaglutide (TRULICITY) 1.5 KD/9.8PJ SOPN Inject 1.5 mg into the skin every Tuesday. 09/02/17   Steele Sizer, MD  glucose blood (ACCU-CHEK AVIVA) test strip Use as instructed 01/07/17   Steele Sizer, MD  JARDIANCE 25 MG TABS tablet TAKE 25 MG BY MOUTH DAILY. 05/29/17   Steele Sizer, MD  Lancets (ACCU-CHEK MULTICLIX) lancets Use as instructed 01/09/17   Steele Sizer, MD  levothyroxine (SYNTHROID, LEVOTHROID) 50 MCG tablet Take 50 mcg by mouth daily before breakfast.  12/29/16   [provider]  loratadine (CLARITIN) 10 MG tablet TAKE 1 TABLET (10 MG TOTAL) BY MOUTH DAILY. 07/08/17   Steele Sizer, MD  metFORMIN (GLUCOPHAGE) 850 MG tablet Take 1 tablet (850 mg total) by mouth 2 (two) times daily. 08/29/17   Steele Sizer, MD  NONFORMULARY OR COMPOUNDED ITEM Apply 1-2 g topically 4 (four) times daily. 10/15/16   Edrick Kins, DPM  Olmesartan-Amlodipine-HCTZ 40-5-25 MG TABS Take 1 tablet by mouth daily. 04/29/17   Steele Sizer, MD  pravastatin (PRAVACHOL) 40 MG tablet TAKE 1 TABLET (40 MG TOTAL) BY MOUTH DAILY. Patient taking  differently: TAKE 1 TABLET (40 MG TOTAL) BY MOUTH AT BEDTIME 05/29/17   Sowles, Drue Stager, MD  timolol (TIMOPTIC) 0.5 % ophthalmic solution Place 1 drop into both eyes 2 (two) times daily. 07/06/15   [provider]  traMADol-acetaminophen (ULTRACET) 37.5-325 MG tablet Take 1 tablet by mouth 2 (two) times daily. 09/05/17   Sable Feil, PA-C  travoprost, benzalkonium, (TRAVATAN) 0.004 % ophthalmic solution Place 1 drop into both eyes at bedtime.     [provider]  Vitamin D, Ergocalciferol, (DRISDOL) 50000 units CAPS capsule TAKE 82505 UNITS BY MOUTH ONCE A WEEK ON [facility] 08/29/17   Steele Sizer, MD    Allergies Augmentin [amoxicillin-pot clavulanate]  Family History  Problem Relation Age of Onset  . Diabetes Mother   . Hypertension Mother   . Dementia Mother   . Hypertension Son     Social History Social History  Substance Use Topics  . Smoking status: Never Smoker  . Smokeless tobacco: Never Used  .  Alcohol use No    Review of Systems  Constitutional: No fever/chills Eyes: No visual changes. ENT: No sore throat. Cardiovascular: Denies chest pain. Respiratory: Denies shortness of breath. Gastrointestinal: No abdominal pain.  No nausea, no vomiting.  No diarrhea.  No constipation. Genitourinary: Negative for dysuria. Musculoskeletal: Negative for back pain. Skin: Negative for rash. Neurological: Negative for headaches, focal weakness or numbness. Endocrine:Diabetes, hyperlipidemia, hypothyroidism, and gout Allergic/Immunilogical: Augmentin  ____________________________________________   PHYSICAL EXAM:  VITAL SIGNS: ED Triage Vitals  Enc Vitals Group     BP 09/05/17 1404 138/69     Pulse Rate 09/05/17 1404 76     Resp 09/05/17 1404 16     Temp 09/05/17 1404 98.1 F (36.7 C)     Temp Source 09/05/17 1404 Oral     SpO2 09/05/17 1404 98 %     Weight --      Height --      Head Circumference --      Peak Flow --      Pain Score 09/05/17  1403 10     Pain Loc --      Pain Edu? --      Excl. in [place]? --    Constitutional: Alert and oriented. Well appearing and in no acute distress. Neck: No stridor. No cervical spine tenderness to palpation.* Hematological/Lymphatic/Immunilogical: No cervical lymphadenopathy. Cardiovascular: Normal rate, regular rhythm. Grossly normal heart sounds.  Good peripheral circulation. Respiratory: Normal respiratory effort.  No retractions. Lungs CTAB. Musculoskeletal: No obvious spinal deformity. No guarding with palpation spinal processes. Patient is moderate guarding palpation left paraspinal muscle group. Patient has left paraspinal muscle spasm right lateral movements. Patient is a negative straight leg test. No lower extremity tenderness nor edema.  No joint effusions. Neurologic:  Normal speech and language. No gross focal neurologic deficits are appreciated. No gait instability. Skin:  Skin is warm, dry and intact. No rash noted. Psychiatric: Mood and affect are normal. Speech and behavior are normal.  ____________________________________________   LABS (all labs ordered are listed, but only abnormal results are displayed)  Labs Reviewed - No data to display ____________________________________________  EKG   ____________________________________________  RADIOLOGY  Dg Lumbar Spine Complete  Result Date: 09/05/2017 CLINICAL DATA:  Acute low back pain without known injury. EXAM: LUMBAR SPINE - COMPLETE 4+ VIEW COMPARISON:  Radiographs of July 14, 2012. FINDINGS: No fracture or spondylolisthesis is noted. Disc spaces are well-maintained. Atherosclerosis of thoracic aorta is noted. Degenerative changes seen involving posterior facet joints of L4-5 and L5-S1. IMPRESSION: Mild degenerative changes involving posterior facet joints of lower lumbar spine as described above. No other significant abnormality seen in the lumbar spine. Electronically Signed   By: Marijo Conception, M.D.   On:  09/05/2017 15:28    ____________________________________________   PROCEDURES  Procedure(s) performed: None  Procedures  Critical Care performed: No  ____________________________________________   INITIAL IMPRESSION / ASSESSMENT AND PLAN / ED COURSE  As part of my medical decision making, I reviewed the following data within the electronic MEDICAL RECORD NUMBER    Muscle spasms secondary to lumbar strain. Discussed x-ray findings of the lumbar spine with patient. Patient given discharge care instructions. Patient advised take medication as directed and follow with PCP.      ____________________________________________   FINAL CLINICAL IMPRESSION(S) / ED DIAGNOSES  Final diagnoses:  Muscle spasm of back  Strain of lumbar region, initial encounter      NEW MEDICATIONS STARTED DURING THIS VISIT:  New Prescriptions  CYCLOBENZAPRINE (FLEXERIL) 10 MG TABLET    Take 0.5 tablets (5 mg total) by mouth 2 (two) times daily before a meal.   TRAMADOL-ACETAMINOPHEN (ULTRACET) 37.5-325 MG TABLET    Take 1 tablet by mouth 2 (two) times daily.     Note:  This document was prepared using Dragon voice recognition software and may include unintentional dictation errors.    Sable Feil, PA-C 09/05/17 1551    Schaevitz, Randall An, MD 09/06/17 [phone]

## 2017-09-05 NOTE — ED Triage Notes (Signed)
Pt states that she has been having lower back pain since Monday. Pt states that everything makes the pain worse. Pt denies any other symptoms at this time.

## 2017-09-05 NOTE — ED Triage Notes (Signed)
FIRST NURSE NOTE-"my back went out".  C/o lower back pain. No injury. No distress.

## 2017-09-07 ENCOUNTER — Other Ambulatory Visit: Payer: Self-pay | Admitting: Family Medicine

## 2017-09-19 ENCOUNTER — Ambulatory Visit (INDEPENDENT_AMBULATORY_CARE_PROVIDER_SITE_OTHER): Payer: Medicare Other

## 2017-09-19 VITALS — BP 140/84 | HR 76

## 2017-09-19 DIAGNOSIS — I10 Essential (primary) hypertension: Secondary | ICD-10-CM

## 2017-09-19 DIAGNOSIS — H2511 Age-related nuclear cataract, right eye: Secondary | ICD-10-CM | POA: Diagnosis not present

## 2017-09-19 NOTE — Progress Notes (Signed)
Patient came in to be evaluated for her blood pressure. Her blood pressure was 140/84 and her pulse was 76. She denies having chest pain, sob, palpitations or visual disturbances. She was being seen at [facility] and her blood pressure was checked with a wrist cuff and it was 109/48. She was sent her to have it checked again.

## 2017-09-22 ENCOUNTER — Encounter: Payer: Self-pay | Admitting: *Deleted

## 2017-09-30 ENCOUNTER — Encounter
Admission: RE | Disposition: A | Discharge status: Home / Self Care | Payer: Self-pay | Source: Ambulatory Visit | Attending: Ophthalmology

## 2017-09-30 ENCOUNTER — Ambulatory Visit: Payer: Medicare Other | Admitting: Anesthesiology

## 2017-09-30 ENCOUNTER — Ambulatory Visit
Admission: RE | Admit: 2017-09-30 | Discharge: 2017-09-30 | Disposition: A | Discharge status: Home / Self Care | Payer: Medicare Other | Source: Ambulatory Visit | Attending: Ophthalmology | Admitting: Ophthalmology

## 2017-09-30 ENCOUNTER — Encounter: Payer: Self-pay | Admitting: *Deleted

## 2017-09-30 DIAGNOSIS — E669 Obesity, unspecified: Secondary | ICD-10-CM | POA: Diagnosis not present

## 2017-09-30 DIAGNOSIS — E119 Type 2 diabetes mellitus without complications: Secondary | ICD-10-CM | POA: Diagnosis not present

## 2017-09-30 DIAGNOSIS — E785 Hyperlipidemia, unspecified: Secondary | ICD-10-CM | POA: Diagnosis not present

## 2017-09-30 DIAGNOSIS — M1990 Unspecified osteoarthritis, unspecified site: Secondary | ICD-10-CM | POA: Insufficient documentation

## 2017-09-30 DIAGNOSIS — E039 Hypothyroidism, unspecified: Secondary | ICD-10-CM | POA: Insufficient documentation

## 2017-09-30 DIAGNOSIS — E1136 Type 2 diabetes mellitus with diabetic cataract: Secondary | ICD-10-CM | POA: Diagnosis not present

## 2017-09-30 DIAGNOSIS — H2511 Age-related nuclear cataract, right eye: Secondary | ICD-10-CM | POA: Diagnosis not present

## 2017-09-30 DIAGNOSIS — M109 Gout, unspecified: Secondary | ICD-10-CM | POA: Diagnosis not present

## 2017-09-30 DIAGNOSIS — I10 Essential (primary) hypertension: Secondary | ICD-10-CM | POA: Insufficient documentation

## 2017-09-30 DIAGNOSIS — G4700 Insomnia, unspecified: Secondary | ICD-10-CM | POA: Diagnosis not present

## 2017-09-30 HISTORY — DX: Transient cerebral ischemic attack, unspecified: G45.9

## 2017-09-30 HISTORY — PX: CATARACT EXTRACTION W/PHACO: SHX586

## 2017-09-30 HISTORY — DX: Muscle spasm of back: M62.830

## 2017-09-30 LAB — GLUCOSE, CAPILLARY: Glucose-Capillary: 168 mg/dL — ABNORMAL HIGH (ref 65–99)

## 2017-09-30 SURGERY — PHACOEMULSIFICATION, CATARACT, WITH IOL INSERTION
Anesthesia: Monitor Anesthesia Care | Site: Eye | Laterality: Right | Wound class: Clean

## 2017-09-30 MED ORDER — ARMC OPHTHALMIC DILATING DROPS
OPHTHALMIC | Status: AC
  Administered 2017-09-30: 1 via OPHTHALMIC
  Filled 2017-09-30: qty 0.4

## 2017-09-30 MED ORDER — EPINEPHRINE PF 1 MG/ML IJ SOLN
INTRAOCULAR | Status: DC | PRN
  Administered 2017-09-30: 10:00:00 via OPHTHALMIC

## 2017-09-30 MED ORDER — MIDAZOLAM HCL 2 MG/2ML IJ SOLN
INTRAMUSCULAR | Status: DC | PRN
  Administered 2017-09-30 (×2): 1 mg via INTRAVENOUS

## 2017-09-30 MED ORDER — MOXIFLOXACIN HCL 0.5 % OP SOLN: 1.0000 [drp] | OPHTHALMIC | Status: DC | PRN

## 2017-09-30 MED ORDER — NA CHONDROIT SULF-NA HYALURON 40-17 MG/ML IO SOLN
INTRAOCULAR | Status: DC | PRN
  Administered 2017-09-30: 1 mL via INTRAOCULAR

## 2017-09-30 MED ORDER — SODIUM CHLORIDE 0.9 % IV SOLN
INTRAVENOUS | Status: DC
  Administered 2017-09-30: 20 mL/h via INTRAVENOUS

## 2017-09-30 MED ORDER — POVIDONE-IODINE 5 % OP SOLN
OPHTHALMIC | Status: DC | PRN
  Administered 2017-09-30: 1 via OPHTHALMIC

## 2017-09-30 MED ORDER — MIDAZOLAM HCL 2 MG/2ML IJ SOLN
INTRAMUSCULAR | Status: AC
  Filled 2017-09-30: qty 2

## 2017-09-30 MED ORDER — ARMC OPHTHALMIC DILATING DROPS
1.0000 "application " | OPHTHALMIC | Status: AC
  Administered 2017-09-30 (×3): 1 via OPHTHALMIC

## 2017-09-30 MED ORDER — MOXIFLOXACIN HCL 0.5 % OP SOLN
OPHTHALMIC | Status: AC
  Filled 2017-09-30: qty 3

## 2017-09-30 MED ORDER — LIDOCAINE HCL (PF) 4 % IJ SOLN
INTRAMUSCULAR | Status: DC | PRN
  Administered 2017-09-30: 4 mL via OPHTHALMIC

## 2017-09-30 MED ORDER — MOXIFLOXACIN HCL 0.5 % OP SOLN
OPHTHALMIC | Status: DC | PRN
  Administered 2017-09-30: 0.2 mL via OPHTHALMIC

## 2017-09-30 MED ORDER — CARBACHOL 0.01 % IO SOLN
INTRAOCULAR | Status: DC | PRN
  Administered 2017-09-30: 0.5 mL via INTRAOCULAR

## 2017-09-30 SURGICAL SUPPLY — 16 items
GLOVE BIO SURGEON STRL SZ8 (GLOVE) ×3 IMPLANT
GLOVE BIOGEL M 6.5 STRL (GLOVE) ×3 IMPLANT
GLOVE SURG LX 8.0 MICRO (GLOVE) ×2
GLOVE SURG LX STRL 8.0 MICRO (GLOVE) ×1 IMPLANT
GOWN STRL REUS W/ TWL LRG LVL3 (GOWN DISPOSABLE) ×2 IMPLANT
GOWN STRL REUS W/TWL LRG LVL3 (GOWN DISPOSABLE) ×4
LABEL CATARACT MEDS ST (LABEL) ×3 IMPLANT
LENS IOL TECNIS ITEC 21.5 (Intraocular Lens) ×3 IMPLANT
PACK CATARACT (MISCELLANEOUS) ×3 IMPLANT
PACK CATARACT BRASINGTON LX (MISCELLANEOUS) ×3 IMPLANT
PACK EYE AFTER SURG (MISCELLANEOUS) ×3 IMPLANT
SOL BSS BAG (MISCELLANEOUS) ×3
SOLUTION BSS BAG (MISCELLANEOUS) ×1 IMPLANT
SYR 5ML LL (SYRINGE) ×3 IMPLANT
WATER STERILE IRR 250ML POUR (IV SOLUTION) ×3 IMPLANT
WIPE NON LINTING 3.25X3.25 (MISCELLANEOUS) ×3 IMPLANT

## 2017-09-30 NOTE — Discharge Instructions (Signed)
Eye Surgery Discharge Instructions  Expect mild scratchy sensation or mild soreness. DO NOT RUB YOUR EYE!  The day of surgery:  Minimal physical activity, but bed rest is not required  No reading, computer work, or close hand work  No bending, lifting, or straining.  May watch TV  For 24 hours:  No driving, legal decisions, or alcoholic beverages  Safety precautions  Eat anything you prefer: It is better to start with liquids, then soup then solid foods.  _____ Eye patch should be worn until postoperative exam tomorrow.  ____ Solar shield eyeglasses should be worn for comfort in the sunlight/patch while sleeping  Resume all regular medications including aspirin or Coumadin if these were discontinued prior to surgery. You may shower, bathe, shave, or wash your hair. Tylenol may be taken for mild discomfort.  Call your doctor if you experience significant pain, nausea, or vomiting, fever > 101 or other signs of infection. [phone] or [phone] Specific instructions:  Follow-up Information    Birder Robson, MD Follow up.   Specialty:  Ophthalmology Why:  November 28 at 10:25am Contact information: [address] [place] [address] [phone]

## 2017-09-30 NOTE — H&P (Signed)
All labs reviewed. Abnormal studies sent to patients PCP when indicated.  Previous H&P reviewed, patient examined, there are NO CHANGES.  Susan Bowen LOUIS11/27/201810:12 AM

## 2017-09-30 NOTE — Anesthesia Post-op Follow-up Note (Signed)
Anesthesia QCDR form completed.        

## 2017-09-30 NOTE — Op Note (Signed)
PREOPERATIVE DIAGNOSIS:  Nuclear sclerotic cataract of the right eye.   POSTOPERATIVE DIAGNOSIS:  NUCLEAR SCLEROTIC CATARACT RIGHT EYE   OPERATIVE PROCEDURE: Procedure(s): CATARACT EXTRACTION PHACO AND INTRAOCULAR LENS PLACEMENT (IOC)   SURGEON:  Birder Robson, MD.   ANESTHESIA:  Anesthesiologist: Martha Clan, MD CRNA: Jonna Clark, CRNA  1.      Managed anesthesia care. 2.      0.61ml of Shugarcaine was instilled in the eye following the paracentesis.   COMPLICATIONS:  None.   TECHNIQUE:   Stop and chop   DESCRIPTION OF PROCEDURE:  The patient was examined and consented in the preoperative holding area where the aforementioned topical anesthesia was applied to the right eye and then brought back to the Operating Room where the right eye was prepped and draped in the usual sterile ophthalmic fashion and a lid speculum was placed. A paracentesis was created with the side port blade and the anterior chamber was filled with viscoelastic. A near clear corneal incision was performed with the steel keratome. A continuous curvilinear capsulorrhexis was performed with a cystotome followed by the capsulorrhexis forceps. Hydrodissection and hydrodelineation were carried out with BSS on a blunt cannula. The lens was removed in a stop and chop  technique and the remaining cortical material was removed with the irrigation-aspiration handpiece. The capsular bag was inflated with viscoelastic and the Technis [id number]  lens was placed in the capsular bag without complication. The remaining viscoelastic was removed from the eye with the irrigation-aspiration handpiece. The wounds were hydrated. The anterior chamber was flushed with Miostat and the eye was inflated to physiologic pressure. 0.47ml of Vigamox was placed in the anterior chamber. The wounds were found to be water tight. The eye was dressed with Vigamox. The patient was given protective glasses to wear throughout the day and a shield with which  to sleep tonight. The patient was also given drops with which to begin a drop regimen today and will follow-up with me in one day. Implant Name Type Inv. Item Serial No. Manufacturer Lot No. LRB No. Used  LENS IOL DIOP 21.5 - [id number] Intraocular Lens LENS IOL DIOP 21.5 [id number]  Right 1   Procedure(s) with comments: CATARACT EXTRACTION PHACO AND INTRAOCULAR LENS PLACEMENT (IOC) (Right) - [place] 01:35.1 AP% 13.1 CDE 12.44 Fluid pack Lot # [id number]  Electronically signed: [place] 09/30/2017 10:43 AM

## 2017-09-30 NOTE — Anesthesia Preprocedure Evaluation (Signed)
Anesthesia Evaluation  Patient identified by MRN, date of birth, ID band Patient awake    Reviewed: Allergy & Precautions, H&P , NPO status , Patient's Chart, lab work & pertinent test results  History of Anesthesia Complications Negative for: history of anesthetic complications  Airway Mallampati: III  TM Distance: <3 FB Neck ROM: limited    Dental  (+) Chipped, Poor Dentition, Missing   Pulmonary neg pulmonary ROS, neg shortness of breath,           Cardiovascular Exercise Tolerance: Good hypertension, (-) angina(-) Past MI and (-) DOE + Valvular Problems/Murmurs      Neuro/Psych TIA Neuromuscular disease negative psych ROS   GI/Hepatic negative GI ROS, Neg liver ROS,   Endo/Other  diabetes, Type 2Hypothyroidism   Renal/GU Renal disease     Musculoskeletal  (+) Arthritis ,   Abdominal   Peds  Hematology negative hematology ROS (+)   Anesthesia Other Findings Past Medical History: No date: Allergy No date: Bunion No date: Carpal tunnel syndrome No date: Cataracta No date: Diabetes mellitus without complication (HCC) No date: Generalized osteoarthritis No date: Gout No date: Heart murmur No date: Hemorrhoids without complication No date: Hyperlipidemia No date: Hypertension No date: Hypothyroidism No date: Impingement syndrome of right shoulder No date: Insomnia No date: Lipoma No date: Lung nodule No date: Mini stroke (HCC) No date: Neuritis or radiculitis due to rupture of lumbar  intervertebral disc No date: Obesity No date: Osteoporosis No date: Proteinuria No date: Rotator cuff tear No date: Tinnitus of both ears No date: Unspecified glaucoma(365.9) No date: Vitamin D deficiency No date: Wedge compression fracture of t11-T12 vertebra, sequela  Past Surgical History: No date: ABDOMINAL HYSTERECTOMY No date: APPENDECTOMY No date: BREAST SURGERY No date: CARPAL TUNNEL RELEASE No date: COLON  SURGERY No date: FEMUR FRACTURE SURGERY; Left 2001: FRACTURE SURGERY     Comment:  left femur No date: HEMORROIDECTOMY No date: polyp removed from vocal cord  BMI    Body Mass Index:  33.25 kg/m      Reproductive/Obstetrics negative OB ROS                             Anesthesia Physical Anesthesia Plan  ASA: III  Anesthesia Plan: MAC   Post-op Pain Management:    Induction: Intravenous  PONV Risk Score and Plan:   Airway Management Planned: Natural Airway and Nasal Cannula  Additional Equipment:   Intra-op Plan:   Post-operative Plan:   Informed Consent: I have reviewed the patients History and Physical, chart, labs and discussed the procedure including the risks, benefits and alternatives for the proposed anesthesia with the patient or authorized representative who has indicated his/her understanding and acceptance.   Dental Advisory Given  Plan Discussed with: Anesthesiologist, CRNA and Surgeon  Anesthesia Plan Comments: (Patient consented for risks of anesthesia including but not limited to:  - adverse reactions to medications - damage to teeth, lips or other oral mucosa - sore throat or hoarseness - Damage to heart, brain, lungs or loss of life  Patient voiced understanding.)        Anesthesia Quick Evaluation  

## 2017-09-30 NOTE — Transfer of Care (Signed)
Immediate Anesthesia Transfer of Care Note  Patient: Susan Bowen  Procedure(s) Performed: CATARACT EXTRACTION PHACO AND INTRAOCULAR LENS PLACEMENT (IOC) (Right Eye)  Patient Location: Short Stay  Anesthesia Type:MAC  Level of Consciousness: awake, alert  and oriented  Airway & Oxygen Therapy: Patient Spontanous Breathing  Post-op Assessment: Report given to RN and Post -op Vital signs reviewed and stable  Post vital signs: Reviewed and stable  Last Vitals:  Vitals:   09/30/17 0912 09/30/17 1045  BP: (!) 137/57 129/70  Pulse: 81 79  Resp: 16 18  Temp: 36.6 C   SpO2: 97% 100%    Last Pain:  Vitals:   09/30/17 0912  TempSrc: Oral         Complications: No apparent anesthesia complications

## 2017-09-30 NOTE — Anesthesia Postprocedure Evaluation (Signed)
Anesthesia Post Note  Patient: Susan Bowen  Procedure(s) Performed: CATARACT EXTRACTION PHACO AND INTRAOCULAR LENS PLACEMENT ([place]) (Right Eye)  Patient location during evaluation: Short Stay Anesthesia Type: MAC Level of consciousness: awake and alert and oriented Pain management: pain level controlled Vital Signs Assessment: post-procedure vital signs reviewed and stable Respiratory status: spontaneous breathing Cardiovascular status: stable Postop Assessment: no headache, no apparent nausea or vomiting and adequate PO intake Anesthetic complications: no     Last Vitals:  Vitals:   09/30/17 1045 09/30/17 1047  BP: 129/70 135/69  Pulse: 79   Resp: 18 16  Temp:    SpO2: 100%     Last Pain:  Vitals:   09/30/17 0912  TempSrc: Oral                 Lanora Manis

## 2017-10-01 ENCOUNTER — Encounter: Payer: Self-pay | Admitting: Ophthalmology

## 2017-10-29 DIAGNOSIS — R0609 Other forms of dyspnea: Secondary | ICD-10-CM | POA: Diagnosis not present

## 2017-10-31 ENCOUNTER — Other Ambulatory Visit: Payer: Self-pay | Admitting: Family Medicine

## 2017-10-31 DIAGNOSIS — I10 Essential (primary) hypertension: Secondary | ICD-10-CM

## 2017-10-31 NOTE — Telephone Encounter (Signed)
Hypertension medication request: Olmestan-Amlod-HCTZ [facility]  Last office visit pertaining to hypertension: 08/29/2017  BP Readings from Last 3 Encounters:  09/30/17 135/69  09/19/17 140/84  09/05/17 138/69    Lab Results  Component Value Date   CREATININE 1.32 (H) 12/19/2016   BUN 17 12/19/2016   NA 142 12/19/2016   K 4.3 12/19/2016   CL 100 12/19/2016   CO2 21 12/19/2016     Follow up on 11/05/2017

## 2017-11-05 ENCOUNTER — Ambulatory Visit: Payer: Medicare Other | Admitting: Family Medicine

## 2017-11-14 ENCOUNTER — Other Ambulatory Visit: Payer: Self-pay | Admitting: Family Medicine

## 2017-11-14 DIAGNOSIS — E1121 Type 2 diabetes mellitus with diabetic nephropathy: Secondary | ICD-10-CM

## 2017-11-14 DIAGNOSIS — E785 Hyperlipidemia, unspecified: Secondary | ICD-10-CM

## 2017-11-14 NOTE — Telephone Encounter (Signed)
Refill Request for Cholesterol medication. [facility]ravastatin [facility] and [facility]   Last physical:  Lab Results  Component Value Date   CHOL 146 12/19/2016   HDL 50 12/19/2016   LDLCALC 69 12/19/2016   TRIG 134 12/19/2016   CHOLHDL 2.8 11/27/2015   F/U on 12/31/2017

## 2017-11-21 ENCOUNTER — Other Ambulatory Visit: Payer: Self-pay | Admitting: Family Medicine

## 2017-11-21 DIAGNOSIS — I10 Essential (primary) hypertension: Secondary | ICD-10-CM

## 2017-11-21 NOTE — Telephone Encounter (Signed)
Refill request for Hypertension medication:  Carvedilol 12.5 mg   Last office visit pertaining to hypertension: 08/29/2017  BP Readings from Last 3 Encounters:  09/30/17 135/69  09/19/17 140/84  09/05/17 138/69     Lab Results  Component Value Date   CREATININE 1.32 (H) 12/19/2016   BUN 17 12/19/2016   NA 142 12/19/2016   K 4.3 12/19/2016   CL 100 12/19/2016   CO2 21 12/19/2016    Follow-up on file. 12/31/2017

## 2017-11-28 ENCOUNTER — Ambulatory Visit: Payer: Medicare Other | Attending: Specialist

## 2017-11-28 DIAGNOSIS — G4761 Periodic limb movement disorder: Secondary | ICD-10-CM | POA: Diagnosis not present

## 2017-11-28 DIAGNOSIS — G4730 Sleep apnea, unspecified: Secondary | ICD-10-CM | POA: Diagnosis not present

## 2017-11-28 DIAGNOSIS — G4733 Obstructive sleep apnea (adult) (pediatric): Secondary | ICD-10-CM | POA: Insufficient documentation

## 2017-12-23 DIAGNOSIS — H1131 Conjunctival hemorrhage, right eye: Secondary | ICD-10-CM | POA: Diagnosis not present

## 2017-12-31 ENCOUNTER — Ambulatory Visit (INDEPENDENT_AMBULATORY_CARE_PROVIDER_SITE_OTHER): Payer: Medicare Other | Admitting: Family Medicine

## 2017-12-31 ENCOUNTER — Encounter: Payer: Self-pay | Admitting: Family Medicine

## 2017-12-31 VITALS — BP 130/60 | HR 81 | Ht 66.0 in | Wt 211.4 lb

## 2017-12-31 DIAGNOSIS — M159 Polyosteoarthritis, unspecified: Secondary | ICD-10-CM

## 2017-12-31 DIAGNOSIS — E1169 Type 2 diabetes mellitus with other specified complication: Secondary | ICD-10-CM

## 2017-12-31 DIAGNOSIS — I10 Essential (primary) hypertension: Secondary | ICD-10-CM | POA: Diagnosis not present

## 2017-12-31 DIAGNOSIS — E559 Vitamin D deficiency, unspecified: Secondary | ICD-10-CM | POA: Diagnosis not present

## 2017-12-31 DIAGNOSIS — E890 Postprocedural hypothyroidism: Secondary | ICD-10-CM

## 2017-12-31 DIAGNOSIS — E785 Hyperlipidemia, unspecified: Secondary | ICD-10-CM | POA: Diagnosis not present

## 2017-12-31 DIAGNOSIS — M810 Age-related osteoporosis without current pathological fracture: Secondary | ICD-10-CM

## 2017-12-31 DIAGNOSIS — S22000D Wedge compression fracture of unspecified thoracic vertebra, subsequent encounter for fracture with routine healing: Secondary | ICD-10-CM

## 2017-12-31 DIAGNOSIS — E1121 Type 2 diabetes mellitus with diabetic nephropathy: Secondary | ICD-10-CM | POA: Diagnosis not present

## 2017-12-31 LAB — POCT GLYCOSYLATED HEMOGLOBIN (HGB A1C): HEMOGLOBIN A1C: 7.2

## 2017-12-31 MED ORDER — METFORMIN HCL 850 MG PO TABS: 850.0000 mg | ORAL_TABLET | Freq: Two times a day (BID) | ORAL | 1 refills | Status: DC

## 2017-12-31 MED ORDER — EMPAGLIFLOZIN 25 MG PO TABS: 25.0000 mg | ORAL_TABLET | Freq: Every day | ORAL | 1 refills | Status: DC

## 2017-12-31 MED ORDER — CARVEDILOL 6.25 MG PO TABS: 6.2500 mg | ORAL_TABLET | Freq: Two times a day (BID) | ORAL | 0 refills | Status: DC

## 2017-12-31 MED ORDER — DULAGLUTIDE 1.5 MG/0.5ML ~~LOC~~ SOAJ: 1.5000 mg | SUBCUTANEOUS | 1 refills | Status: DC

## 2017-12-31 NOTE — Patient Instructions (Signed)

## 2017-12-31 NOTE — Progress Notes (Signed)
Name: Susan Bowen   MRN: [id number]    DOB: 11/12/1931   Date:12/31/2017       Progress Note  Subjective  Chief Complaint  Chief Complaint  Patient presents with  . Diabetes  . Hypertension  . Medication Refill    4 month F/U    HPI  DMII with microalbuminuria; she is taking medications and also aspirin, ARB and statin therapy. FSBS at home has been 114-134 fasting, sometimes goes up to 181 post-prandially. No polyphagia, polydipsia or polyuria. Her hgbA1C was 10.5% down to 8.2% ,7.7% up 11.8%, she had 8 sessions with dietician and hgbA1C was 7.4%, 7.6% down to 7.2% today, she is still following a diabetic diet most of the time. She has been taking Metformin twice daily, Trulicity, Jardiance and is doing well, gained some weight, but has been following a healthier diet   HTN: she has been taking medication. No chest pain or palpitation, but has been noticing dizziness when getting up at times. She is off Clonididine, decrease dose of coreg   Hyperlipidemia: taking Pravastatin and denies side effects of medications, no myalgia  Morbid Obesity: needs to resume physical activity  Osteoporosis: history of compression fracture on CT 2016, we will schedule bone density and check labs  Pulmonary nodule: seen by Dr. Fleming, lung nodule stable and has yearly follow ups She had an echo done that showed Tricuspid regurgitation and LVH. Seen by Dr. Arida, negative stress test. She is doing well at this time, off inhalers.   Goiter: s/p thyroid ablation, Summer 2016. Seeing Dr. Solum, she is on Levothyroxine 25 mcg daily .   Patient Active Problem List   Diagnosis Date Noted  . Elevated uric acid in blood 04/29/2017  . Plantar fasciitis of left foot 03/12/2016  . Hypothyroidism, postablative 01/23/2016  . Atypical chest pain 09/26/2015  . Aortic stenosis 09/26/2015  . Allergic rhinitis, seasonal 08/08/2015  . Benign hypertension 08/08/2015  . History of pneumonia 08/08/2015   . Carpal tunnel syndrome 08/08/2015  . Cataract 08/08/2015  . Insomnia, persistent 08/08/2015  . Dyslipidemia 08/08/2015  . History of depression 08/08/2015  . Glaucoma 08/08/2015  . Controlled gout 08/08/2015  . Fever blister 08/08/2015  . Bilateral hearing loss 08/08/2015  . Hemorrhoid 08/08/2015  . H/O iron deficiency anemia 08/08/2015  . Benign neoplasm of colon 08/08/2015  . Impingement syndrome of shoulder 08/08/2015  . Osteoporosis, post-menopausal 08/08/2015  . Morbid obesity (HCC) 08/08/2015  . Generalized OA 08/08/2015  . Multinodular goiter 08/08/2015  . Asymptomatic varicose veins 08/08/2015  . Polyp of vocal cord 08/08/2015  . Wedge fracture of thoracic vertebra (HCC) 08/08/2015  . 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 LVH (left ventricular hypertrophy) 08/08/2015  . Moderate tricuspid regurgitation 08/08/2015  . History of radioactive iodine thyroid ablation 08/08/2015  . Lung nodule, solitary 05/12/2014  . Chronic cough 05/11/2014  . Vitamin D deficiency 12/20/2009  . Plantar fascial fibromatosis 12/20/2009  . Personal history of fall 07/20/2008  . Cervical radiculitis 05/11/2008    Past Surgical History:  Procedure Laterality Date  . ABDOMINAL HYSTERECTOMY  1986  . APPENDECTOMY    . BREAST SURGERY Bilateral 1983   biopsy of each side, negative  . CARPAL TUNNEL RELEASE    . CATARACT EXTRACTION W/PHACO Left 09/03/2017   Procedure: CATARACT EXTRACTION PHACO AND INTRAOCULAR LENS PLACEMENT (IOC)-LEFT DIABETIC;  Surgeon: Porfilio, William, MD;  Location: [facility];  Service: Ophthalmology;  Laterality: Left;  US 01:10.2 AP% 16.7 CDE   11.71 Fluid Pack Lot # [id number]  . CATARACT EXTRACTION W/PHACO Right 09/30/2017   Procedure: CATARACT EXTRACTION PHACO AND INTRAOCULAR LENS PLACEMENT (IOC);  Surgeon: Porfilio, William, MD;  Location: [facility];  Service: Ophthalmology;  Laterality: Right;  US 01:35.1 AP% 13.1 CDE 12.44 Fluid  pack Lot # [id number]  . COLON SURGERY    . EYE SURGERY Left 09/03/2017   Cataract extraction  . FEMUR FRACTURE SURGERY Left   . FRACTURE SURGERY  2001   left femur  . HEMORROIDECTOMY    . polyp removed from vocal cord      Family History  Problem Relation Age of Onset  . Diabetes Mother   . Hypertension Mother   . Dementia Mother   . Hypertension Son     Social History   Socioeconomic History  . Marital status: Divorced    Spouse name: Not on file  . Number of children: Not on file  . Years of education: Not on file  . Highest education level: Not on file  Social Needs  . Financial resource strain: Not on file  . Food insecurity - worry: Not on file  . Food insecurity - inability: Not on file  . Transportation needs - medical: Not on file  . Transportation needs - non-medical: Not on file  Occupational History  . Not on file  Tobacco Use  . Smoking status: Never Smoker  . Smokeless tobacco: Never Used  Substance and Sexual Activity  . Alcohol use: No    Alcohol/week: 0.0 oz  . Drug use: No  . Sexual activity: Not Currently  Other Topics Concern  . Not on file  Social History Narrative  . Not on file     Current Outpatient Medications:  .  aspirin EC 81 MG tablet, Take 81 mg at bedtime by mouth., Disp: , Rfl:  .  Blood Glucose Monitoring Suppl (ACCU-CHEK AVIVA PLUS) w/Device KIT, 1 kit by Does not apply route 3 (three) times daily., Disp: 1 kit, Rfl: 0 .  carvedilol (COREG) 6.25 MG tablet, Take 1 tablet (6.25 mg total) by mouth 2 (two) times daily. New dose, Disp: 180 tablet, Rfl: 0 .  Cyanocobalamin (B-12) 1000 MCG TABS, Take 1,000 mcg daily by mouth., Disp: , Rfl:  .  cyclobenzaprine (FLEXERIL) 10 MG tablet, Take 0.5 tablets (5 mg total) by mouth 2 (two) times daily before a meal., Disp: 10 tablet, Rfl: 0 .  diclofenac sodium (VOLTAREN) 1 % GEL, Apply 4 g topically 4 (four) times daily. (Patient taking differently: Apply 4 g topically 4 (four) times daily as  needed (for pain). ), Disp: 300 g, Rfl: 1 .  [START ON 01/06/2018] Dulaglutide (TRULICITY) 1.5 MG/0.5ML SOPN, Inject 1.5 mg into the skin every Tuesday., Disp: 12 pen, Rfl: 1 .  empagliflozin (JARDIANCE) 25 MG TABS tablet, Take 25 mg by mouth daily., Disp: 90 tablet, Rfl: 1 .  glucose blood (ACCU-CHEK AVIVA) test strip, Use as instructed, Disp: 200 each, Rfl: 12 .  Lancets (ACCU-CHEK MULTICLIX) lancets, Use as instructed, Disp: 204 each, Rfl: 12 .  levothyroxine (SYNTHROID, LEVOTHROID) 50 MCG tablet, Take 50 mcg by mouth daily before breakfast. , Disp: , Rfl:  .  loratadine (CLARITIN) 10 MG tablet, TAKE 1 TABLET (10 MG TOTAL) BY MOUTH DAILY., Disp: 90 tablet, Rfl: 1 .  metFORMIN (GLUCOPHAGE) 850 MG tablet, Take 1 tablet (850 mg total) by mouth 2 (two) times daily., Disp: 180 tablet, Rfl: 1 .  naproxen sodium (ALEVE) 220 MG tablet, Take 220   mg daily as needed by mouth (pain)., Disp: , Rfl:  .  Olmesartan-Amlodipine-HCTZ 40-5-25 MG TABS, TAKE 1 TABLET BY MOUTH EVERY DAY, Disp: 90 tablet, Rfl: 1 .  pravastatin (PRAVACHOL) 40 MG tablet, TAKE 1 TABLET (40 MG TOTAL) BY MOUTH DAILY., Disp: 90 tablet, Rfl: 1 .  timolol (BETIMOL) 0.5 % ophthalmic solution, Place 1 drop 2 (two) times daily into both eyes. , Disp: , Rfl:  .  traMADol-acetaminophen (ULTRACET) 37.5-325 MG tablet, Take 1 tablet by mouth 2 (two) times daily., Disp: 10 tablet, Rfl: 0 .  travoprost, benzalkonium, (TRAVATAN) 0.004 % ophthalmic solution, Place 1 drop into both eyes at bedtime. , Disp: , Rfl:  .  Vitamin D, Ergocalciferol, (DRISDOL) 50000 units CAPS capsule, TAKE 50000 UNITS BY MOUTH ONCE A WEEK ON WEDNESDAY, Disp: 12 capsule, Rfl: 1  Allergies  Allergen Reactions  . Augmentin [Amoxicillin-Pot Clavulanate] Itching    Has patient had a PCN reaction causing immediate rash, facial/tongue/throat swelling, SOB or lightheadedness with hypotension: No Has patient had a PCN reaction causing severe rash involving mucus membranes or skin  necrosis: No Has patient had a PCN reaction that required hospitalization: No Has patient had a PCN reaction occurring within the last 10 years: No If all of the above answers are "NO", then may proceed with Cephalosporin use.      ROS  Constitutional: Negative for fever , positive for mild weight change.  Respiratory: Postiive for mild cough over the past week, but no  shortness of breath.   Cardiovascular: Negative for chest pain or palpitations.  Gastrointestinal: Negative for abdominal pain, no bowel changes.  Musculoskeletal: Negative for gait problem or joint swelling.  Skin: Negative for rash.  Neurological: Negative for dizziness or headache.  No other specific complaints in a complete review of systems (except as listed in HPI above).  Objective  Vitals:   12/31/17 1124  BP: 130/60  Pulse: 81  Weight: 211 lb 6.4 oz (95.9 kg)  Height: 5' 6" (1.676 m)    Body mass index is 34.12 kg/m.  Physical Exam  Constitutional: Patient appears well-developed and well-nourished. Obese No distress.  HEENT: head atraumatic, normocephalic, pupils equal and reactive to light, neck supple, throat within normal limits Cardiovascular: Normal rate, regular rhythm and normal heart sounds.  No murmur heard. No BLE edema. Pulmonary/Chest: Effort normal and breath sounds normal. No respiratory distress. Abdominal: Soft.  There is no tenderness. Psychiatric: Patient has a normal mood and affect. behavior is normal. Judgment and thought content normal.  Recent Results (from the past 2160 hour(s))  POCT HgB A1C     Status: Abnormal   Collection Time: 12/31/17 11:38 AM  Result Value Ref Range   Hemoglobin A1C 7.2      PHQ2/9: Depression screen PHQ 2/9 08/29/2017 04/29/2017 12/30/2016 08/26/2016 04/22/2016  Decreased Interest 0 0 0 0 0  Down, Depressed, Hopeless 0 0 0 0 0  PHQ - 2 Score 0 0 0 0 0     Fall Risk: Fall Risk  12/31/2017 08/29/2017 04/29/2017 12/30/2016 08/26/2016  Falls in  the past year? No No No No No     Functional Status Survey: Is the patient deaf or have difficulty hearing?: No Does the patient have difficulty seeing, even when wearing glasses/contacts?: No Does the patient have difficulty concentrating, remembering, or making decisions?: No Does the patient have difficulty walking or climbing stairs?: No Does the patient have difficulty dressing or bathing?: No Does the patient have difficulty doing errands alone such   as visiting a doctor's office or shopping?: No   Assessment & Plan  1. Diabetes mellitus with proteinuric diabetic nephropathy (HCC)  - POCT HgB A1C improved   2. Benign hypertension  - CBC with Differential/Platelet - Comprehensive metabolic panel We will decrease dose of Coreg to 6.25 and consider stopping medication if still has orthostatic changes  3. Dyslipidemia associated with type 2 diabetes mellitus (HCC)  Taking pravastatin   4. Hypothyroidism, postablative  - DG Bone Density; Future  5. Morbid obesity ([place])   6. Vitamin D deficiency  - VITAMIN D 25 Hydroxy (Vit-D Deficiency, Fractures)  7. Osteoporosis, post-menopausal  - DG Bone Density; Future - Parathyroid hormone, intact (no Ca)  8. Generalized OA  Stable, takes medication prn   9. Closed wedge fracture of thoracic vertebra with routine healing, unspecified thoracic vertebral level, subsequent encounter  On CT done back in 2016 - DG Bone Density; Future  10. Dyslipidemia  - Lipid panel

## 2018-01-01 ENCOUNTER — Encounter: Payer: Self-pay | Admitting: Family Medicine

## 2018-01-01 DIAGNOSIS — E349 Endocrine disorder, unspecified: Secondary | ICD-10-CM | POA: Insufficient documentation

## 2018-01-01 LAB — CBC WITH DIFFERENTIAL/PLATELET
Basophils Absolute: 36 cells/uL (ref 0–200)
Basophils Relative: 0.4 %
EOS PCT: 2.4 %
Eosinophils Absolute: 214 cells/uL (ref 15–500)
HCT: 37.7 % (ref 35.0–45.0)
Hemoglobin: 12 g/dL (ref 11.7–15.5)
LYMPHS ABS: 3142 {cells}/uL (ref 850–3900)
MCH: 26.2 pg — ABNORMAL LOW (ref 27.0–33.0)
MCHC: 31.8 g/dL — ABNORMAL LOW (ref 32.0–36.0)
MCV: 82.3 fL (ref 80.0–100.0)
MONOS PCT: 7.2 %
MPV: 10.3 fL (ref 7.5–12.5)
NEUTROS PCT: 54.7 %
Neutro Abs: 4868 cells/uL (ref 1500–7800)
PLATELETS: 320 10*3/uL (ref 140–400)
RBC: 4.58 10*6/uL (ref 3.80–5.10)
RDW: 14.9 % (ref 11.0–15.0)
Total Lymphocyte: 35.3 %
WBC mixed population: 641 cells/uL (ref 200–950)
WBC: 8.9 10*3/uL (ref 3.8–10.8)

## 2018-01-01 LAB — COMPREHENSIVE METABOLIC PANEL
AG Ratio: 1.3 (calc) (ref 1.0–2.5)
ALT: 8 U/L (ref 6–29)
AST: 10 U/L (ref 10–35)
Albumin: 3.8 g/dL (ref 3.6–5.1)
Alkaline phosphatase (APISO): 60 U/L (ref 33–130)
BILIRUBIN TOTAL: 0.3 mg/dL (ref 0.2–1.2)
BUN/Creatinine Ratio: 13 (calc) (ref 6–22)
BUN: 14 mg/dL (ref 7–25)
CALCIUM: 9.5 mg/dL (ref 8.6–10.4)
CO2: 29 mmol/L (ref 20–32)
CREATININE: 1.04 mg/dL — AB (ref 0.60–0.88)
Chloride: 106 mmol/L (ref 98–110)
GLUCOSE: 119 mg/dL (ref 65–139)
Globulin: 2.9 g/dL (calc) (ref 1.9–3.7)
POTASSIUM: 4.2 mmol/L (ref 3.5–5.3)
Sodium: 141 mmol/L (ref 135–146)
TOTAL PROTEIN: 6.7 g/dL (ref 6.1–8.1)

## 2018-01-01 LAB — LIPID PANEL
CHOL/HDL RATIO: 2.7 (calc) (ref <5.0)
Cholesterol: 135 mg/dL (ref <200)
HDL: 50 mg/dL — ABNORMAL LOW (ref >50)
LDL CHOLESTEROL (CALC): 61 mg/dL
Non-HDL Cholesterol (Calc): 85 mg/dL (calc) (ref <130)
TRIGLYCERIDES: 164 mg/dL — AB (ref <150)

## 2018-01-01 LAB — VITAMIN D 25 HYDROXY (VIT D DEFICIENCY, FRACTURES): VIT D 25 HYDROXY: 43 ng/mL (ref 30–100)

## 2018-01-01 LAB — PARATHYROID HORMONE, INTACT (NO CA): PTH: 93 pg/mL — AB (ref 14–64)

## 2018-01-13 ENCOUNTER — Other Ambulatory Visit: Payer: Self-pay | Admitting: Family Medicine

## 2018-01-13 DIAGNOSIS — J302 Other seasonal allergic rhinitis: Secondary | ICD-10-CM

## 2018-01-13 NOTE — Telephone Encounter (Signed)
[facility] request for general medication. [facility]  Last office visit 12/31/2017  Follow up on 05/11/2018

## 2018-01-28 DIAGNOSIS — E890 Postprocedural hypothyroidism: Secondary | ICD-10-CM | POA: Diagnosis not present

## 2018-01-28 DIAGNOSIS — E042 Nontoxic multinodular goiter: Secondary | ICD-10-CM | POA: Diagnosis not present

## 2018-02-04 DIAGNOSIS — E890 Postprocedural hypothyroidism: Secondary | ICD-10-CM | POA: Diagnosis not present

## 2018-02-04 DIAGNOSIS — E042 Nontoxic multinodular goiter: Secondary | ICD-10-CM | POA: Diagnosis not present

## 2018-02-12 DIAGNOSIS — H401132 Primary open-angle glaucoma, bilateral, moderate stage: Secondary | ICD-10-CM | POA: Diagnosis not present

## 2018-02-24 DIAGNOSIS — H401132 Primary open-angle glaucoma, bilateral, moderate stage: Secondary | ICD-10-CM | POA: Diagnosis not present

## 2018-03-05 ENCOUNTER — Other Ambulatory Visit: Payer: Self-pay | Admitting: Family Medicine

## 2018-03-05 DIAGNOSIS — E559 Vitamin D deficiency, unspecified: Secondary | ICD-10-CM

## 2018-03-05 NOTE — Telephone Encounter (Signed)
Refill request for general medication: Vitamin D 50,000 units  Last office visit: 12/31/2017  Last physical exam: None indicated   Follow-ups on file. 05/11/2018

## 2018-03-22 ENCOUNTER — Emergency Department: Payer: Medicare Other

## 2018-03-22 ENCOUNTER — Other Ambulatory Visit: Payer: Self-pay

## 2018-03-22 ENCOUNTER — Observation Stay
Admission: EM | Admit: 2018-03-22 | Discharge: 2018-03-24 | Disposition: A | Discharge status: Home / Self Care | Payer: Medicare Other | Attending: Internal Medicine | Admitting: Internal Medicine

## 2018-03-22 DIAGNOSIS — Z794 Long term (current) use of insulin: Secondary | ICD-10-CM | POA: Diagnosis not present

## 2018-03-22 DIAGNOSIS — I10 Essential (primary) hypertension: Secondary | ICD-10-CM | POA: Diagnosis not present

## 2018-03-22 DIAGNOSIS — M109 Gout, unspecified: Secondary | ICD-10-CM | POA: Insufficient documentation

## 2018-03-22 DIAGNOSIS — R079 Chest pain, unspecified: Secondary | ICD-10-CM | POA: Diagnosis not present

## 2018-03-22 DIAGNOSIS — Z6835 Body mass index (BMI) 35.0-35.9, adult: Secondary | ICD-10-CM | POA: Diagnosis not present

## 2018-03-22 DIAGNOSIS — Z8673 Personal history of transient ischemic attack (TIA), and cerebral infarction without residual deficits: Secondary | ICD-10-CM | POA: Diagnosis not present

## 2018-03-22 DIAGNOSIS — H409 Unspecified glaucoma: Secondary | ICD-10-CM | POA: Diagnosis not present

## 2018-03-22 DIAGNOSIS — E039 Hypothyroidism, unspecified: Secondary | ICD-10-CM | POA: Insufficient documentation

## 2018-03-22 DIAGNOSIS — Z880 Allergy status to penicillin: Secondary | ICD-10-CM | POA: Insufficient documentation

## 2018-03-22 DIAGNOSIS — R0789 Other chest pain: Secondary | ICD-10-CM | POA: Diagnosis not present

## 2018-03-22 DIAGNOSIS — E119 Type 2 diabetes mellitus without complications: Secondary | ICD-10-CM | POA: Diagnosis not present

## 2018-03-22 DIAGNOSIS — Z7901 Long term (current) use of anticoagulants: Secondary | ICD-10-CM | POA: Insufficient documentation

## 2018-03-22 DIAGNOSIS — R000 Tachycardia, unspecified: Secondary | ICD-10-CM | POA: Diagnosis not present

## 2018-03-22 DIAGNOSIS — E785 Hyperlipidemia, unspecified: Secondary | ICD-10-CM | POA: Insufficient documentation

## 2018-03-22 DIAGNOSIS — I272 Pulmonary hypertension, unspecified: Secondary | ICD-10-CM | POA: Insufficient documentation

## 2018-03-22 DIAGNOSIS — G4700 Insomnia, unspecified: Secondary | ICD-10-CM | POA: Diagnosis not present

## 2018-03-22 DIAGNOSIS — Z79899 Other long term (current) drug therapy: Secondary | ICD-10-CM | POA: Insufficient documentation

## 2018-03-22 DIAGNOSIS — M159 Polyosteoarthritis, unspecified: Secondary | ICD-10-CM | POA: Diagnosis not present

## 2018-03-22 DIAGNOSIS — E669 Obesity, unspecified: Secondary | ICD-10-CM | POA: Diagnosis not present

## 2018-03-22 DIAGNOSIS — E559 Vitamin D deficiency, unspecified: Secondary | ICD-10-CM | POA: Diagnosis not present

## 2018-03-22 DIAGNOSIS — I4891 Unspecified atrial fibrillation: Principal | ICD-10-CM | POA: Insufficient documentation

## 2018-03-22 LAB — CBC
HEMATOCRIT: 38.4 % (ref 35.0–47.0)
HEMOGLOBIN: 12.6 g/dL (ref 12.0–16.0)
MCH: 27.4 pg (ref 26.0–34.0)
MCHC: 32.9 g/dL (ref 32.0–36.0)
MCV: 83.1 fL (ref 80.0–100.0)
Platelets: 296 10*3/uL (ref 150–440)
RBC: 4.62 MIL/uL (ref 3.80–5.20)
RDW: 16.1 % — ABNORMAL HIGH (ref 11.5–14.5)
WBC: 11.8 10*3/uL — ABNORMAL HIGH (ref 3.6–11.0)

## 2018-03-22 NOTE — ED Provider Notes (Signed)
[facility] Emergency Department Provider Note    First MD Initiated Contact with Patient 03/22/18 2305     (approximate)  I have reviewed the triage vital signs and the nursing notes.   HISTORY  Chief Complaint Chest Pain; Tachycardia; and Numbness    HPI Susan Bowen is a 84 y.o. female below list of chronic medical conditions presents to the emergency department acute onset of rapid and irregular heartbeat which began at 10 PM tonight.  Patient admits to diaphoresis however denies any chest pain at present but does admit to pressure earlier..  Patient denies any shortness of breath.  Patient denies any lower externally pain swelling.  Patient denies any previous history of cardiac arrhythmia.   Past Medical History:  Diagnosis Date  . Allergy   . Back spasm   . Bunion   . Carpal tunnel syndrome   . Cataracta   . Diabetes mellitus without complication ([place])   . Generalized osteoarthritis   . Gout   . Heart murmur    no treatment  . Hemorrhoids without complication   . Hyperlipidemia   . Hypertension   . Hypothyroidism   . Impingement syndrome of right shoulder   . Insomnia   . Lipoma   . Lung nodule   . Mini stroke ([place]) 1998   no residual effects  . Neuritis or radiculitis due to rupture of lumbar intervertebral disc   . Obesity   . Osteoporosis   . Proteinuria   . Rotator cuff tear   . Stroke ([place]) 1998   small TIA.  no residual effects  . TIA (transient ischemic attack)   . Tinnitus of both ears   . Unspecified glaucoma(365.9)   . Vitamin D deficiency   . Wedge compression fracture of t11-T12 vertebra, sequela     Patient Active Problem List   Diagnosis Date Noted  . Chest pain 03/23/2018  . Elevated parathyroid hormone 01/01/2018  . Elevated uric acid in blood 04/29/2017  . Plantar fasciitis of left foot 03/12/2016  . Hypothyroidism, postablative 01/23/2016  . Atypical chest pain 09/26/2015  . Aortic stenosis 09/26/2015   . Allergic rhinitis, seasonal 08/08/2015  . Benign hypertension 08/08/2015  . History of pneumonia 08/08/2015  . Carpal tunnel syndrome 08/08/2015  . Cataract 08/08/2015  . Insomnia, persistent 08/08/2015  . Dyslipidemia 08/08/2015  . History of depression 08/08/2015  . Glaucoma 08/08/2015  . Controlled gout 08/08/2015  . Fever blister 08/08/2015  . Bilateral hearing loss 08/08/2015  . Hemorrhoid 08/08/2015  . H/O iron deficiency anemia 08/08/2015  . Benign neoplasm of colon 08/08/2015  . Impingement syndrome of shoulder 08/08/2015  . Osteoporosis, post-menopausal 08/08/2015  . Morbid obesity ([place]) 08/08/2015  . Generalized OA 08/08/2015  . Multinodular goiter 08/08/2015  . Asymptomatic varicose veins 08/08/2015  . Polyp of vocal cord 08/08/2015  . Wedge fracture of thoracic vertebra ([place]) 08/08/2015  . Diabetes mellitus with proteinuric diabetic nephropathy ([place]) 08/08/2015  . LVH (left ventricular hypertrophy) 08/08/2015  . Moderate tricuspid regurgitation 08/08/2015  . History of radioactive iodine thyroid ablation 08/08/2015  . Lung nodule, solitary 05/12/2014  . Chronic cough 05/11/2014  . Vitamin D deficiency 12/20/2009  . Plantar fascial fibromatosis 12/20/2009  . Personal history of fall 07/20/2008  . Cervical radiculitis 05/11/2008    Past Surgical History:  Procedure Laterality Date  . ABDOMINAL HYSTERECTOMY  1986  . APPENDECTOMY    . BREAST SURGERY Bilateral 1983   biopsy of each side, negative  .  CARPAL TUNNEL RELEASE    . CATARACT EXTRACTION W/PHACO Left 09/03/2017   Procedure: CATARACT EXTRACTION PHACO AND INTRAOCULAR LENS PLACEMENT (IOC)-LEFT DIABETIC;  Surgeon: Birder Robson, MD;  Location: [facility] ORS;  Service: Ophthalmology;  Laterality: Left;  [place] 01:10.2 AP% 16.7 CDE 11.71 Fluid Pack Lot # [id number]  . CATARACT EXTRACTION W/PHACO Right 09/30/2017   Procedure: CATARACT EXTRACTION PHACO AND INTRAOCULAR LENS PLACEMENT (IOC);  Surgeon: Birder Robson, MD;  Location: [facility] ORS;  Service: Ophthalmology;  Laterality: Right;  [place] 01:35.1 AP% 13.1 CDE 12.44 Fluid pack Lot # [id number]  . COLON SURGERY    . EYE SURGERY Left 09/03/2017   Cataract extraction  . FEMUR FRACTURE SURGERY Left   . FRACTURE SURGERY  2001   left femur  . HEMORROIDECTOMY    . polyp removed from vocal cord      Prior to Admission medications   Medication Sig Start Date End Date Taking? Authorizing Provider  aspirin EC 81 MG tablet Take 81 mg at bedtime by mouth.   Yes [provider]  carvedilol (COREG) 6.25 MG tablet Take 1 tablet (6.25 mg total) by mouth 2 (two) times daily. New dose 12/31/17  Yes Sowles, Drue Stager, MD  COMBIGAN 0.2-0.5 % ophthalmic solution Place 1 drop into both eyes 2 (two) times daily.   Yes [provider]  Cyanocobalamin (B-12) 1000 MCG TABS Take 1,000 mcg daily by mouth.   Yes [provider]  cyclobenzaprine (FLEXERIL) 10 MG tablet Take 0.5 tablets (5 mg total) by mouth 2 (two) times daily before a meal. Patient taking differently: Take 10 mg by mouth 3 (three) times daily as needed for muscle spasms.  09/05/17  Yes Sable Feil, PA-C  diclofenac sodium (VOLTAREN) 1 % GEL Apply 4 g topically 4 (four) times daily. Patient taking differently: Apply 4 g topically 4 (four) times daily as needed (for pain).  08/26/16  Yes Sowles, Drue Stager, MD  Dulaglutide (TRULICITY) 1.5 RX/5.4MG SOPN Inject 1.5 mg into the skin every Tuesday. Patient taking differently: Inject 1.5 mg into the skin every Thursday.  01/06/18  Yes Sowles, Drue Stager, MD  empagliflozin (JARDIANCE) 25 MG TABS tablet Take 25 mg by mouth daily. 12/31/17  Yes Sowles, Drue Stager, MD  levothyroxine (SYNTHROID, LEVOTHROID) 50 MCG tablet Take 50 mcg by mouth daily before breakfast.  12/29/16  Yes [provider]  loratadine (CLARITIN) 10 MG tablet TAKE 1 TABLET (10 MG TOTAL) BY MOUTH DAILY. 01/13/18  Yes Sowles, Drue Stager, MD  metFORMIN (GLUCOPHAGE) 850 MG tablet  Take 1 tablet (850 mg total) by mouth 2 (two) times daily. 12/31/17  Yes Sowles, Drue Stager, MD  Olmesartan-Amlodipine-HCTZ 40-5-25 MG TABS TAKE 1 TABLET BY MOUTH EVERY DAY 11/04/17  Yes Sowles, Drue Stager, MD  pravastatin (PRAVACHOL) 40 MG tablet TAKE 1 TABLET (40 MG TOTAL) BY MOUTH DAILY. 11/14/17  Yes Sowles, Drue Stager, MD  Travoprost, BAK Free, (TRAVATAN) 0.004 % SOLN ophthalmic solution Place 1 drop into both eyes at bedtime.   Yes [provider]  Vitamin D, Ergocalciferol, (DRISDOL) 50000 units CAPS capsule TAKE 1 TABLET BY MOUTH ONCE A WEEK ON WEDNESDAY 03/05/18  Yes Sowles, Drue Stager, MD  Blood Glucose Monitoring Suppl (ACCU-CHEK AVIVA PLUS) w/Device KIT 1 kit by Does not apply route 3 (three) times daily. 01/09/17   Steele Sizer, MD  glucose blood (ACCU-CHEK AVIVA) test strip Use as instructed 01/07/17   Steele Sizer, MD  Lancets (ACCU-CHEK MULTICLIX) lancets Use as instructed 01/09/17   Steele Sizer, MD  traMADol-acetaminophen (ULTRACET) 37.5-325 MG tablet  Take 1 tablet by mouth 2 (two) times daily. Patient not taking: Reported on 03/23/2018 09/05/17   Sable Feil, PA-C    Allergies Augmentin [amoxicillin-pot clavulanate]  Family History  Problem Relation Age of Onset  . Diabetes Mother   . Hypertension Mother   . Dementia Mother   . Hypertension Son     Social History Social History   Tobacco Use  . Smoking status: Never Smoker  . Smokeless tobacco: Never Used  Substance Use Topics  . Alcohol use: No    Alcohol/week: 0.0 oz  . Drug use: No    Review of Systems Constitutional: No fever/chills Eyes: No visual changes. ENT: No sore throat. Cardiovascular: Denies chest pain.  Positive for palpitations and rapid heartbeat Respiratory: Denies shortness of breath. Gastrointestinal: No abdominal pain.  No nausea, no vomiting.  No diarrhea.  No constipation. Genitourinary: Negative for dysuria. Musculoskeletal: Negative for neck pain.  Negative for back  pain. Integumentary: Negative for rash. Neurological: Negative for headaches, focal weakness or numbness.   ____________________________________________   PHYSICAL EXAM:  VITAL SIGNS: ED Triage Vitals  Enc Vitals Group     BP 03/22/18 2251 (!) 157/87     Pulse Rate 03/22/18 2251 (!) 152     Resp 03/22/18 2251 18     Temp 03/22/18 2251 98.1 F (36.7 C)     Temp Source 03/22/18 2251 Oral     SpO2 03/22/18 2251 97 %     Weight 03/22/18 2248 96.6 kg (213 lb)     Height 03/22/18 2248 1.651 m (5' 5" )     Head Circumference --      Peak Flow --      Pain Score 03/22/18 2247 8     Pain Loc --      Pain Edu? --      Excl. in [place]? --     Constitutional: Alert and oriented. Well appearing and in no acute distress. Eyes: Conjunctivae are normal.  Head: Atraumatic. Mouth/Throat: Mucous membranes are moist.  Oropharynx non-erythematous. Neck: No stridor.   Cardiovascular: Irregularly irregular rhythm.  Tachycardic. Good peripheral circulation. Grossly normal heart sounds. Respiratory: Normal respiratory effort.  No retractions. Lungs CTAB. Gastrointestinal: Soft and nontender. No distention.  Musculoskeletal: No lower extremity tenderness nor edema. No gross deformities of extremities. Neurologic:  Normal speech and language. No gross focal neurologic deficits are appreciated.  Skin:  Skin is warm, dry and intact. No rash noted. Psychiatric: Mood and affect are normal. Speech and behavior are normal.  ____________________________________________   LABS (all labs ordered are listed, but only abnormal results are displayed)  Labs Reviewed  BASIC METABOLIC PANEL - Abnormal; Notable for the following components:      Result Value   Potassium 3.1 (*)    Glucose, Bld 185 (*)    Creatinine, Ser 1.15 (*)    GFR calc non Af Amer 42 (*)    GFR calc Af Amer 49 (*)    All other components within normal limits  CBC - Abnormal; Notable for the following components:   WBC 11.8 (*)    RDW  16.1 (*)    All other components within normal limits  TROPONIN I   ____________________________________________  EKG  ED ECG REPORT I, [place] N Pat Elicker, the attending physician, personally viewed and interpreted this ECG.   Date: 03/22/2018  EKG Time: 10:47 PM  Rate: 118  Rhythm: Sinus tachycardia  Axis: Normal  Intervals: Irregular RR intervals  ST&T Change: None  ____________________________________________  RADIOLOGY I, Gregor Hams, personally viewed and evaluated these images (plain radiographs) as part of my medical decision making, as well as reviewing the written report by the radiologist.  ED MD interpretation: No acute findings per radiologist.  Official radiology report(s): Dg Chest Port 1 View  Result Date: 03/22/2018 CLINICAL DATA:  Atrial fibrillation. Chest pain, numbness and lightheadedness. EXAM: PORTABLE CHEST 1 VIEW COMPARISON:  CT 07/19/2016, radiograph 03/28/2014 FINDINGS: The cardiomediastinal contours are unchanged with mild cardiomegaly. The lungs are clear. Pulmonary vasculature is normal. No consolidation, pleural effusion, or pneumothorax. No acute osseous abnormalities are seen. Sclerotic densities in the left anterior fifth rib as seen on prior CT, unchanged dating back to 2015 chest CT. IMPRESSION: No acute findings. Electronically Signed   By: Jeb Levering M.D.   On: 03/22/2018 23:17    _____________ Procedures ED ECG REPORT I, [place] N Harrold Fitchett, the attending physician, personally viewed and interpreted this ECG.   Date: 03/23/2018  EKG Time: 11:16 PM  Rate: 84  Rhythm: Normal sinus rhythm  Axis: Normal  Intervals: Normal  ST&T Change: None   ____________________________________________   INITIAL IMPRESSION / ASSESSMENT AND PLAN / ED COURSE  As part of my medical decision making, I reviewed the following data within the electronic MEDICAL RECORD NUMBER 84 year old female presented with above-stated history and physical exam with  EKG on arrival consistent with atrial fibrillation with rapid ventricular response.  During my evaluation the patient converted to normal sinus rhythm without any intervention.  Given chest pressure and new onset atrial fibrillation patient discussed with Dr. Jannifer Franklin for hospital admission further evaluation and management.  ____________________________________________  FINAL CLINICAL IMPRESSION(S) / ED DIAGNOSES  Final diagnoses:  Atrial fibrillation with rapid ventricular response ([place])  New onset atrial fibrillation ([facility])  Chest pain, unspecified type     MEDICATIONS GIVEN DURING THIS VISIT:  Medications - No data to display   ED Discharge Orders    None       Note:  This document was prepared using Dragon voice recognition software and may include unintentional dictation errors.    Gregor Hams, MD 03/23/18 [phone]

## 2018-03-22 NOTE — ED Triage Notes (Signed)
Patient to ED for chest pain, numbness and lightheadedness about 30 mins. Patient denies N/V. States a little diaphoresis at onset but that resolved. Patient states the lightheaded feeling is unchanged with movement and is present most of the time.

## 2018-03-23 ENCOUNTER — Other Ambulatory Visit: Payer: Self-pay

## 2018-03-23 DIAGNOSIS — I480 Paroxysmal atrial fibrillation: Secondary | ICD-10-CM

## 2018-03-23 DIAGNOSIS — I4891 Unspecified atrial fibrillation: Secondary | ICD-10-CM | POA: Diagnosis not present

## 2018-03-23 DIAGNOSIS — E039 Hypothyroidism, unspecified: Secondary | ICD-10-CM | POA: Diagnosis not present

## 2018-03-23 DIAGNOSIS — R079 Chest pain, unspecified: Secondary | ICD-10-CM | POA: Diagnosis present

## 2018-03-23 DIAGNOSIS — I10 Essential (primary) hypertension: Secondary | ICD-10-CM | POA: Diagnosis not present

## 2018-03-23 LAB — BASIC METABOLIC PANEL
ANION GAP: 11 (ref 5–15)
BUN: 16 mg/dL (ref 6–20)
CO2: 22 mmol/L (ref 22–32)
Calcium: 9.3 mg/dL (ref 8.9–10.3)
Chloride: 106 mmol/L (ref 101–111)
Creatinine, Ser: 1.15 mg/dL — ABNORMAL HIGH (ref 0.44–1.00)
GFR calc Af Amer: 49 mL/min — ABNORMAL LOW (ref >60)
GFR calc non Af Amer: 42 mL/min — ABNORMAL LOW (ref >60)
GLUCOSE: 185 mg/dL — AB (ref 65–99)
POTASSIUM: 3.1 mmol/L — AB (ref 3.5–5.1)
Sodium: 139 mmol/L (ref 135–145)

## 2018-03-23 LAB — GLUCOSE, CAPILLARY
Glucose-Capillary: 118 mg/dL — ABNORMAL HIGH (ref 65–99)
Glucose-Capillary: 124 mg/dL — ABNORMAL HIGH (ref 65–99)
Glucose-Capillary: 158 mg/dL — ABNORMAL HIGH (ref 65–99)
Glucose-Capillary: 139 mg/dL — ABNORMAL HIGH (ref 65–99)

## 2018-03-23 LAB — HEMOGLOBIN A1C
HEMOGLOBIN A1C: 7.4 % — AB (ref 4.8–5.6)
MEAN PLASMA GLUCOSE: 165.68 mg/dL

## 2018-03-23 LAB — MAGNESIUM: MAGNESIUM: 1.4 mg/dL — AB (ref 1.7–2.4)

## 2018-03-23 LAB — TROPONIN I
Troponin I: <0.03 ng/mL (ref <0.03)
Troponin I: <0.03 ng/mL (ref <0.03)
Troponin I: <0.03 ng/mL (ref <0.03)
Troponin I: <0.03 ng/mL (ref <0.03)

## 2018-03-23 LAB — TSH: TSH: 2.686 u[IU]/mL (ref 0.350–4.500)

## 2018-03-23 MED ORDER — INSULIN ASPART 100 UNIT/ML ~~LOC~~ SOLN
0.0000 [IU] | Freq: Three times a day (TID) | SUBCUTANEOUS | Status: DC
  Administered 2018-03-23: 3 [IU] via SUBCUTANEOUS
  Administered 2018-03-24: 2 [IU] via SUBCUTANEOUS
  Administered 2018-03-24: 5 [IU] via SUBCUTANEOUS
  Filled 2018-03-23 (×3): qty 1

## 2018-03-23 MED ORDER — SODIUM CHLORIDE 0.9 % IV SOLN
INTRAVENOUS | Status: DC
  Administered 2018-03-23: 04:00:00 via INTRAVENOUS

## 2018-03-23 MED ORDER — BRIMONIDINE TARTRATE 0.2 % OP SOLN
1.0000 [drp] | Freq: Two times a day (BID) | OPHTHALMIC | Status: DC
  Administered 2018-03-23 – 2018-03-24 (×2): 1 [drp] via OPHTHALMIC
  Filled 2018-03-23 (×2): qty 5

## 2018-03-23 MED ORDER — ENOXAPARIN SODIUM 40 MG/0.4ML ~~LOC~~ SOLN: 40.0000 mg | SUBCUTANEOUS | Status: DC

## 2018-03-23 MED ORDER — LATANOPROST 0.005 % OP SOLN
1.0000 [drp] | Freq: Every day | OPHTHALMIC | Status: DC
  Administered 2018-03-23: 1 [drp] via OPHTHALMIC
  Filled 2018-03-23: qty 2.5

## 2018-03-23 MED ORDER — INSULIN ASPART 100 UNIT/ML ~~LOC~~ SOLN: 0.0000 [IU] | Freq: Every day | SUBCUTANEOUS | Status: DC

## 2018-03-23 MED ORDER — DOCUSATE SODIUM 100 MG PO CAPS
100.0000 mg | ORAL_CAPSULE | Freq: Two times a day (BID) | ORAL | Status: DC
  Filled 2018-03-23 (×2): qty 1

## 2018-03-23 MED ORDER — TIMOLOL MALEATE 0.5 % OP SOLN
1.0000 [drp] | Freq: Two times a day (BID) | OPHTHALMIC | Status: DC
  Administered 2018-03-23 – 2018-03-24 (×3): 1 [drp] via OPHTHALMIC
  Filled 2018-03-23: qty 5

## 2018-03-23 MED ORDER — PRAVASTATIN SODIUM 40 MG PO TABS
40.0000 mg | ORAL_TABLET | Freq: Every day | ORAL | Status: DC
  Administered 2018-03-23: 40 mg via ORAL
  Filled 2018-03-23: qty 1

## 2018-03-23 MED ORDER — OLMESARTAN-AMLODIPINE-HCTZ 40-5-25 MG PO TABS: 1.0000 | ORAL_TABLET | Freq: Every day | ORAL | Status: DC

## 2018-03-23 MED ORDER — IRBESARTAN 150 MG PO TABS
300.0000 mg | ORAL_TABLET | Freq: Every day | ORAL | Status: DC
  Administered 2018-03-23 – 2018-03-24 (×2): 300 mg via ORAL
  Filled 2018-03-23 (×2): qty 2

## 2018-03-23 MED ORDER — LEVOTHYROXINE SODIUM 50 MCG PO TABS
50.0000 ug | ORAL_TABLET | Freq: Every day | ORAL | Status: DC
  Administered 2018-03-23 – 2018-03-24 (×2): 50 ug via ORAL
  Filled 2018-03-23 (×2): qty 1

## 2018-03-23 MED ORDER — AMLODIPINE BESYLATE 5 MG PO TABS
5.0000 mg | ORAL_TABLET | Freq: Every day | ORAL | Status: DC
  Administered 2018-03-23 – 2018-03-24 (×2): 5 mg via ORAL
  Filled 2018-03-23 (×2): qty 1

## 2018-03-23 MED ORDER — CARVEDILOL 6.25 MG PO TABS
6.2500 mg | ORAL_TABLET | Freq: Two times a day (BID) | ORAL | Status: DC
  Administered 2018-03-23: 6.25 mg via ORAL
  Filled 2018-03-23: qty 1

## 2018-03-23 MED ORDER — LORATADINE 10 MG PO TABS
10.0000 mg | ORAL_TABLET | Freq: Every day | ORAL | Status: DC
  Administered 2018-03-23 – 2018-03-24 (×2): 10 mg via ORAL
  Filled 2018-03-23 (×2): qty 1

## 2018-03-23 MED ORDER — ACETAMINOPHEN 650 MG RE SUPP
650.0000 mg | Freq: Four times a day (QID) | RECTAL | Status: DC | PRN
Start: 2018-03-23 — End: 2018-03-24

## 2018-03-23 MED ORDER — APIXABAN 5 MG PO TABS
5.0000 mg | ORAL_TABLET | Freq: Two times a day (BID) | ORAL | Status: DC
  Administered 2018-03-23 – 2018-03-24 (×3): 5 mg via ORAL
  Filled 2018-03-23 (×3): qty 1

## 2018-03-23 MED ORDER — ASPIRIN EC 81 MG PO TBEC: 81.0000 mg | DELAYED_RELEASE_TABLET | Freq: Every day | ORAL | Status: DC

## 2018-03-23 MED ORDER — VITAMIN B-12 1000 MCG PO TABS
1000.0000 ug | ORAL_TABLET | Freq: Every day | ORAL | Status: DC
  Administered 2018-03-23 – 2018-03-24 (×2): 1000 ug via ORAL
  Filled 2018-03-23 (×2): qty 1

## 2018-03-23 MED ORDER — ONDANSETRON HCL 4 MG PO TABS: 4.0000 mg | ORAL_TABLET | Freq: Four times a day (QID) | ORAL | Status: DC | PRN

## 2018-03-23 MED ORDER — ACETAMINOPHEN 325 MG PO TABS
650.0000 mg | ORAL_TABLET | Freq: Four times a day (QID) | ORAL | Status: DC | PRN
  Administered 2018-03-23: 650 mg via ORAL
  Filled 2018-03-23 (×2): qty 2

## 2018-03-23 MED ORDER — ONDANSETRON HCL 4 MG/2ML IJ SOLN: 4.0000 mg | Freq: Four times a day (QID) | INTRAMUSCULAR | Status: DC | PRN

## 2018-03-23 MED ORDER — SODIUM CHLORIDE 0.9% FLUSH
3.0000 mL | Freq: Two times a day (BID) | INTRAVENOUS | Status: DC
  Administered 2018-03-23 – 2018-03-24 (×2): 3 mL via INTRAVENOUS

## 2018-03-23 MED ORDER — HYDROCHLOROTHIAZIDE 25 MG PO TABS
25.0000 mg | ORAL_TABLET | Freq: Every day | ORAL | Status: DC
Start: 2018-03-23 — End: 2018-03-24
  Administered 2018-03-23 – 2018-03-24 (×2): 25 mg via ORAL
  Filled 2018-03-23 (×2): qty 1

## 2018-03-23 MED ORDER — BRIMONIDINE TARTRATE-TIMOLOL 0.2-0.5 % OP SOLN: 1.0000 [drp] | Freq: Two times a day (BID) | OPHTHALMIC | Status: DC

## 2018-03-23 MED ORDER — CYCLOBENZAPRINE HCL 10 MG PO TABS
5.0000 mg | ORAL_TABLET | Freq: Two times a day (BID) | ORAL | Status: DC
  Administered 2018-03-23 – 2018-03-24 (×3): 5 mg via ORAL
  Filled 2018-03-23 (×3): qty 1

## 2018-03-23 MED ORDER — CARVEDILOL 12.5 MG PO TABS
12.5000 mg | ORAL_TABLET | Freq: Two times a day (BID) | ORAL | Status: DC
  Administered 2018-03-23 – 2018-03-24 (×2): 12.5 mg via ORAL
  Filled 2018-03-23 (×2): qty 1

## 2018-03-23 MED ORDER — POTASSIUM CHLORIDE CRYS ER 20 MEQ PO TBCR
40.0000 meq | EXTENDED_RELEASE_TABLET | Freq: Two times a day (BID) | ORAL | Status: AC
  Administered 2018-03-23 (×2): 40 meq via ORAL
  Filled 2018-03-23 (×2): qty 2

## 2018-03-23 MED ORDER — VITAMIN D (ERGOCALCIFEROL) 1.25 MG (50000 UNIT) PO CAPS: 50000.0000 [IU] | ORAL_CAPSULE | ORAL | Status: DC

## 2018-03-23 NOTE — H&P (Signed)
Susan Bowen is an 84 y.o. female.   Chief Complaint: Chest pain HPI: The patient with past medical history of diabetes, hypothyroidism, TIA, hypertension and hyperlipidemia presents emergency department complaining of chest pain.  The patient states that her chest pain began at rest.  It seems to radiate into her collar region but was not associated with nausea, vomiting or diaphoresis.  At times she admits to shortness of breath but is otherwise comfortable.  The patient states that she also felt a "fluttering" in her chest.  Her chest pain resolved in the emergency department which did coincide with cessation of palpitations.  Telemetry showed atrial fibrillation with rapid ventricular rate upon arrival but the patient spontaneously converted to normal sinus rhythm.  Due to her risk factors and new onset of atrial fibrillation the emergency department staff, hospitalist service for admission.  Past Medical History:  Diagnosis Date  . Allergy   . Back spasm   . Bunion   . Carpal tunnel syndrome   . Cataracta   . Diabetes mellitus without complication ([place])   . Generalized osteoarthritis   . Gout   . Heart murmur    no treatment  . Hemorrhoids without complication   . Hyperlipidemia   . Hypertension   . Hypothyroidism   . Impingement syndrome of right shoulder   . Insomnia   . Lipoma   . Lung nodule   . Mini stroke ([place]) 1998   no residual effects  . Neuritis or radiculitis due to rupture of lumbar intervertebral disc   . Obesity   . Osteoporosis   . Proteinuria   . Rotator cuff tear   . Stroke ([place]) 1998   small TIA.  no residual effects  . TIA (transient ischemic attack)   . Tinnitus of both ears   . Unspecified glaucoma(365.9)   . Vitamin D deficiency   . Wedge compression fracture of t11-T12 vertebra, sequela     Past Surgical History:  Procedure Laterality Date  . ABDOMINAL HYSTERECTOMY  1986  . APPENDECTOMY    . BREAST SURGERY Bilateral 1983   biopsy of each  side, negative  . CARPAL TUNNEL RELEASE    . CATARACT EXTRACTION W/PHACO Left 09/03/2017   Procedure: CATARACT EXTRACTION PHACO AND INTRAOCULAR LENS PLACEMENT (IOC)-LEFT DIABETIC;  Surgeon: Birder Robson, MD;  Location: [facility] ORS;  Service: Ophthalmology;  Laterality: Left;  [place] 01:10.2 AP% 16.7 CDE 11.71 Fluid Pack Lot # [id number]  . CATARACT EXTRACTION W/PHACO Right 09/30/2017   Procedure: CATARACT EXTRACTION PHACO AND INTRAOCULAR LENS PLACEMENT (IOC);  Surgeon: Birder Robson, MD;  Location: [facility] ORS;  Service: Ophthalmology;  Laterality: Right;  [place] 01:35.1 AP% 13.1 CDE 12.44 Fluid pack Lot # [id number]  . COLON SURGERY    . EYE SURGERY Left 09/03/2017   Cataract extraction  . FEMUR FRACTURE SURGERY Left   . FRACTURE SURGERY  2001   left femur  . HEMORROIDECTOMY    . polyp removed from vocal cord      Family History  Problem Relation Age of Onset  . Diabetes Mother   . Hypertension Mother   . Dementia Mother   . Hypertension Son    Social History:  reports that she has never smoked. She has never used smokeless tobacco. She reports that she does not drink alcohol or use drugs.  Allergies:  Allergies  Allergen Reactions  . Augmentin [Amoxicillin-Pot Clavulanate] Itching    Has patient had a PCN reaction causing immediate rash, facial/tongue/throat swelling, SOB or lightheadedness  with hypotension: No Has patient had a PCN reaction causing severe rash involving mucus membranes or skin necrosis: No Has patient had a PCN reaction that required hospitalization: No Has patient had a PCN reaction occurring within the last 10 years: No If all of the above answers are "NO", then may proceed with Cephalosporin use.     Medications Prior to Admission  Medication Sig Dispense Refill  . aspirin EC 81 MG tablet Take 81 mg at bedtime by mouth.    . carvedilol (COREG) 6.25 MG tablet Take 1 tablet (6.25 mg total) by mouth 2 (two) times daily. New dose 180 tablet 0  . COMBIGAN  0.2-0.5 % ophthalmic solution Place 1 drop into both eyes 2 (two) times daily.    . Cyanocobalamin (B-12) 1000 MCG TABS Take 1,000 mcg daily by mouth.    . cyclobenzaprine (FLEXERIL) 10 MG tablet Take 0.5 tablets (5 mg total) by mouth 2 (two) times daily before a meal. (Patient taking differently: Take 10 mg by mouth 3 (three) times daily as needed for muscle spasms. ) 10 tablet 0  . diclofenac sodium (VOLTAREN) 1 % GEL Apply 4 g topically 4 (four) times daily. (Patient taking differently: Apply 4 g topically 4 (four) times daily as needed (for pain). ) 300 g 1  . Dulaglutide (TRULICITY) 1.5 ZO/1.0RU SOPN Inject 1.5 mg into the skin every Tuesday. (Patient taking differently: Inject 1.5 mg into the skin every Thursday. ) 12 pen 1  . empagliflozin (JARDIANCE) 25 MG TABS tablet Take 25 mg by mouth daily. 90 tablet 1  . levothyroxine (SYNTHROID, LEVOTHROID) 50 MCG tablet Take 50 mcg by mouth daily before breakfast.     . loratadine (CLARITIN) 10 MG tablet TAKE 1 TABLET (10 MG TOTAL) BY MOUTH DAILY. 90 tablet 1  . metFORMIN (GLUCOPHAGE) 850 MG tablet Take 1 tablet (850 mg total) by mouth 2 (two) times daily. 180 tablet 1  . Olmesartan-Amlodipine-HCTZ 40-5-25 MG TABS TAKE 1 TABLET BY MOUTH EVERY DAY 90 tablet 1  . pravastatin (PRAVACHOL) 40 MG tablet TAKE 1 TABLET (40 MG TOTAL) BY MOUTH DAILY. 90 tablet 1  . Travoprost, BAK Free, (TRAVATAN) 0.004 % SOLN ophthalmic solution Place 1 drop into both eyes at bedtime.    . Vitamin D, Ergocalciferol, (DRISDOL) 50000 units CAPS capsule TAKE 1 TABLET BY MOUTH ONCE A WEEK ON WEDNESDAY 12 capsule 1  . Blood Glucose Monitoring Suppl (ACCU-CHEK AVIVA PLUS) w/Device KIT 1 kit by Does not apply route 3 (three) times daily. 1 kit 0  . glucose blood (ACCU-CHEK AVIVA) test strip Use as instructed 200 each 12  . Lancets (ACCU-CHEK MULTICLIX) lancets Use as instructed 204 each 12  . traMADol-acetaminophen (ULTRACET) 37.5-325 MG tablet Take 1 tablet by mouth 2 (two) times  daily. (Patient not taking: Reported on 03/23/2018) 10 tablet 0    Results for orders placed or performed during the hospital encounter of 03/22/18 (from the past 48 hour(s))  Basic metabolic panel     Status: Abnormal   Collection Time: 03/22/18 11:34 PM  Result Value Ref Range   Sodium 139 135 - 145 mmol/L   Potassium 3.1 (L) 3.5 - 5.1 mmol/L   Chloride 106 101 - 111 mmol/L   CO2 22 22 - 32 mmol/L   Glucose, Bld 185 (H) 65 - 99 mg/dL   BUN 16 6 - 20 mg/dL   Creatinine, Ser 1.15 (H) 0.44 - 1.00 mg/dL   Calcium 9.3 8.9 - 10.3 mg/dL   GFR calc  non Af Amer 42 (L) >60 mL/min   GFR calc Af Amer 49 (L) >60 mL/min    Comment: (NOTE) The eGFR has been calculated using the CKD EPI equation. This calculation has not been validated in all clinical situations. eGFR's persistently <60 mL/min signify possible Chronic Kidney Disease.    Anion gap 11 5 - 15    Comment: Performed at [facility] [address] [place], [place] [address]  CBC     Status: Abnormal   Collection Time: 03/22/18 11:34 PM  Result Value Ref Range   WBC 11.8 (H) 3.6 - 11.0 K/uL   RBC 4.62 3.80 - 5.20 MIL/uL   Hemoglobin 12.6 12.0 - 16.0 g/dL   HCT 38.4 35.0 - 47.0 %   MCV 83.1 80.0 - 100.0 fL   MCH 27.4 26.0 - 34.0 pg   MCHC 32.9 32.0 - 36.0 g/dL   RDW 16.1 (H) 11.5 - 14.5 %   Platelets 296 150 - 440 K/uL    Comment: Performed at [facility], [address] [place], [place] [address]  Troponin I     Status: None   Collection Time: 03/22/18 11:34 PM  Result Value Ref Range   Troponin I <0.03 <0.03 ng/mL    Comment: Performed at [facility], [address] [place], [place] [address]  TSH     Status: None   Collection Time: 03/23/18  5:04 AM  Result Value Ref Range   TSH 2.686 0.350 - 4.500 uIU/mL    Comment: Performed by a 3rd Generation assay with a functional sensitivity of <=0.01 uIU/mL. Performed at [facility], [address] [place], [place] [address]    Troponin I     Status: None   Collection Time: 03/23/18  5:04 AM  Result Value Ref Range   Troponin I <0.03 <0.03 ng/mL    Comment: Performed at [facility], [address] [place], [place] [address]   Dg Chest Port 1 View  Result Date: 03/22/2018 CLINICAL DATA:  Atrial fibrillation. Chest pain, numbness and lightheadedness. EXAM: PORTABLE CHEST 1 VIEW COMPARISON:  CT 07/19/2016, radiograph 03/28/2014 FINDINGS: The cardiomediastinal contours are unchanged with mild cardiomegaly. The lungs are clear. Pulmonary vasculature is normal. No consolidation, pleural effusion, or pneumothorax. No acute osseous abnormalities are seen. Sclerotic densities in the left anterior fifth rib as seen on prior CT, unchanged dating back to 2015 chest CT. IMPRESSION: No acute findings. Electronically Signed   By: Jeb Levering M.D.   On: 03/22/2018 23:17    Review of Systems  Constitutional: Negative for chills and fever.  HENT: Negative for sore throat and tinnitus.   Eyes: Negative for blurred vision and redness.  Respiratory: Negative for cough and shortness of breath.   Cardiovascular: Negative for chest pain, palpitations, orthopnea and PND.  Gastrointestinal: Negative for abdominal pain, diarrhea, nausea and vomiting.  Genitourinary: Negative for dysuria, frequency and urgency.  Musculoskeletal: Negative for joint pain and myalgias.  Skin: Negative for rash.       No lesions  Neurological: Negative for speech change, focal weakness and weakness.  Endo/Heme/Allergies: Does not bruise/bleed easily.       No temperature intolerance  Psychiatric/Behavioral: Negative for depression and suicidal ideas.    Blood pressure (!) 152/65, pulse 65, temperature 97.7 F (36.5 C), temperature source Oral, resp. rate (!) 24, height _0  (1.651 m), weight 94.9 kg (209 lb 3.2 oz), SpO2 100 %. Physical Exam  Vitals reviewed. Constitutional: She is  oriented to person, place, and time. She appears  well-developed and well-nourished. No distress.  HENT:  Head: Normocephalic and atraumatic.  Mouth/Throat: Oropharynx is clear and moist.  Eyes: Pupils are equal, round, and reactive to light. Conjunctivae and EOM are normal. No scleral icterus.  Neck: Normal range of motion. No tracheal deviation present. No thyromegaly present.  Cardiovascular: Normal rate, regular rhythm and normal heart sounds. Exam reveals no gallop and no friction rub.  No murmur heard. Respiratory: Effort normal and breath sounds normal.  GI: Soft. Bowel sounds are normal. She exhibits no distension. There is no tenderness.  Genitourinary:  Genitourinary Comments: Deferred  Musculoskeletal: Normal range of motion. She exhibits no edema.  Lymphadenopathy:    She has no cervical adenopathy.  Neurological: She is alert and oriented to person, place, and time. No cranial nerve deficit. She exhibits normal muscle tone.  Skin: Skin is warm and dry. No rash noted. No erythema.  Psychiatric: She has a normal mood and affect. Her behavior is normal. Judgment and thought content normal.     Assessment/Plan This is an 84 year old female admitted for chest pain. 1.  Chest pain: Secondary to demand ischemia due to tachyarrhythmia.  Follow troponin and monitor telemetry.  Consult cardiology. 2.  Atrial fibrillation: New onset; rapid ventricular rate resolved.  Continue to monitor telemetry.  The patient received an echocardiogram 2 years ago.  Repeat at the discretion of cardiology. 3.  Hypertension: Controlled; continue amlodipine, carvedilol, ARB and hydrochlorothiazide. 4.  Hypothyroidism: Check TSH; continue Synthroid next line 5.  Diabetes mellitus type 2: Hold oral hypoglycemic agents as well as adjunct injectables.  Sliding-scale insulin while hospitalized. 6.  Hyperlipidemia: Continue statin therapy 7.  Glaucoma: Continue eyedrops 8.  DVT prophylaxis: Lovenox 9.  GI prophylaxis: None The patient is a full code.  Time  spent on admission orders and patient care approximately 45 minutes  Harrie Foreman, MD 03/23/2018, 6:21 AM

## 2018-03-23 NOTE — Consult Note (Signed)
Cardiology Consultation:   Patient ID: ALIEA BOBE; [id number]; 1932/02/17   Admit date: 03/22/2018 Date of Consult: 03/23/2018  Primary Care Provider: Steele Sizer, MD Primary Cardiologist: Fletcher Anon (last saw in 2017)   Patient Profile:   Susan Bowen is a 84 y.o. female with a hx of mild aortic stenosis, moderate tricuspid regurgitation, pulmonary hypertension, DM2, HTN, HLD, TIA, hypothyroidism, obesity, glaucoma on timolol, and back pain with radiculopathy who is being seen today for the evaluation of new onset Afib with RVR and chest pain at the request of Dr. Marcille Blanco, MD.  History of Present Illness:   Susan Bowen has previously been followed by Dr. Fletcher Anon, last being seen in 2017 for follow up of her AS, TR, and pulmonary hypertension. Echo in 2017 showed an EF of 55-60%, no RWMA, Gr1DD, mild AS with a mean gradient of 9 mmHg and a VTI of 1.96 cm^2, mild MR, mildly dilated LA, PASP 42 mmHg. She has been lost to follow up since.   Patient was in her usual state of health until ~ 10 PM on 03/22/2018 when she was watching "the ball game" and developed sudden onset of palpitations with associated left sided chest pain that radiated down her left arm. She notes a history of palpitations over the years, though never with anything like this. She has stable 2-pillow orthopnea and has noted mild LE swelling over the past couple of days.   Upon the patient's arrival to [facility] they were found to be in new onset Afib with RVR with heart rates in to the 120s bpm. BP stable, oxygen saturation 97% on room air, weight 213 pounds. EKG as below, CXR showed no acute findings. Labs showed troponin negative x 2, potassium 3.1, TSH normal, SCr 1.15, glucose 185, WBC 11.8, HGB 12.6, PLT 296. In the ED, she was given IV fluids and flexeril. She spontaneously converted to NSR at 23:11. Upon admission, she was continued on PTA medications and cardiology was asked to evaluate. Currently, feels "jittery" though  has no chest pain, palpitations, SOB, diaphoresis, nausea, vomiting, dizziness, presyncope, or syncope.   Past Medical History:  Diagnosis Date  . Allergy   . Back spasm   . Bunion   . Carpal tunnel syndrome   . Cataracta   . Diabetes mellitus without complication ([place])   . Generalized osteoarthritis   . Gout   . Heart murmur    no treatment  . Hemorrhoids without complication   . Hyperlipidemia   . Hypertension   . Hypothyroidism   . Impingement syndrome of right shoulder   . Insomnia   . Lipoma   . Lung nodule   . Mini stroke ([place]) 1998   no residual effects  . Neuritis or radiculitis due to rupture of lumbar intervertebral disc   . Obesity   . Osteoporosis   . Proteinuria   . Rotator cuff tear   . Stroke ([place]) 1998   small TIA.  no residual effects  . TIA (transient ischemic attack)   . Tinnitus of both ears   . Unspecified glaucoma(365.9)   . Vitamin D deficiency   . Wedge compression fracture of t11-T12 vertebra, sequela     Past Surgical History:  Procedure Laterality Date  . ABDOMINAL HYSTERECTOMY  1986  . APPENDECTOMY    . BREAST SURGERY Bilateral 1983   biopsy of each side, negative  . CARPAL TUNNEL RELEASE    . CATARACT EXTRACTION W/PHACO Left 09/03/2017   Procedure: CATARACT  EXTRACTION PHACO AND INTRAOCULAR LENS PLACEMENT (IOC)-LEFT DIABETIC;  Surgeon: Birder Robson, MD;  Location: [facility] ORS;  Service: Ophthalmology;  Laterality: Left;  [place] 01:10.2 AP% 16.7 CDE 11.71 Fluid Pack Lot # [id number]  . CATARACT EXTRACTION W/PHACO Right 09/30/2017   Procedure: CATARACT EXTRACTION PHACO AND INTRAOCULAR LENS PLACEMENT (IOC);  Surgeon: Birder Robson, MD;  Location: [facility] ORS;  Service: Ophthalmology;  Laterality: Right;  [place] 01:35.1 AP% 13.1 CDE 12.44 Fluid pack Lot # [id number]  . COLON SURGERY    . EYE SURGERY Left 09/03/2017   Cataract extraction  . FEMUR FRACTURE SURGERY Left   . FRACTURE SURGERY  2001   left femur  . HEMORROIDECTOMY    . polyp  removed from vocal cord       Home Meds: Prior to Admission medications   Medication Sig Start Date End Date Taking? Authorizing Provider  aspirin EC 81 MG tablet Take 81 mg at bedtime by mouth.   Yes [provider]  carvedilol (COREG) 6.25 MG tablet Take 1 tablet (6.25 mg total) by mouth 2 (two) times daily. New dose 12/31/17  Yes Sowles, Drue Stager, MD  COMBIGAN 0.2-0.5 % ophthalmic solution Place 1 drop into both eyes 2 (two) times daily.   Yes [provider]  Cyanocobalamin (B-12) 1000 MCG TABS Take 1,000 mcg daily by mouth.   Yes [provider]  cyclobenzaprine (FLEXERIL) 10 MG tablet Take 0.5 tablets (5 mg total) by mouth 2 (two) times daily before a meal. Patient taking differently: Take 10 mg by mouth 3 (three) times daily as needed for muscle spasms.  09/05/17  Yes Sable Feil, PA-C  diclofenac sodium (VOLTAREN) 1 % GEL Apply 4 g topically 4 (four) times daily. Patient taking differently: Apply 4 g topically 4 (four) times daily as needed (for pain).  08/26/16  Yes Sowles, Drue Stager, MD  Dulaglutide (TRULICITY) 1.5 WG/9.5AO SOPN Inject 1.5 mg into the skin every Tuesday. Patient taking differently: Inject 1.5 mg into the skin every Thursday.  01/06/18  Yes Sowles, Drue Stager, MD  empagliflozin (JARDIANCE) 25 MG TABS tablet Take 25 mg by mouth daily. 12/31/17  Yes Sowles, Drue Stager, MD  levothyroxine (SYNTHROID, LEVOTHROID) 50 MCG tablet Take 50 mcg by mouth daily before breakfast.  12/29/16  Yes [provider]  loratadine (CLARITIN) 10 MG tablet TAKE 1 TABLET (10 MG TOTAL) BY MOUTH DAILY. 01/13/18  Yes Sowles, Drue Stager, MD  metFORMIN (GLUCOPHAGE) 850 MG tablet Take 1 tablet (850 mg total) by mouth 2 (two) times daily. 12/31/17  Yes Sowles, Drue Stager, MD  Olmesartan-Amlodipine-HCTZ 40-5-25 MG TABS TAKE 1 TABLET BY MOUTH EVERY DAY 11/04/17  Yes Sowles, Drue Stager, MD  pravastatin (PRAVACHOL) 40 MG tablet TAKE 1 TABLET (40 MG TOTAL) BY MOUTH DAILY. 11/14/17  Yes Sowles,  Drue Stager, MD  Travoprost, BAK Free, (TRAVATAN) 0.004 % SOLN ophthalmic solution Place 1 drop into both eyes at bedtime.   Yes [provider]  Vitamin D, Ergocalciferol, (DRISDOL) 50000 units CAPS capsule TAKE 1 TABLET BY MOUTH ONCE A WEEK ON WEDNESDAY 03/05/18  Yes Sowles, Drue Stager, MD  Blood Glucose Monitoring Suppl (ACCU-CHEK AVIVA PLUS) w/Device KIT 1 kit by Does not apply route 3 (three) times daily. 01/09/17   Steele Sizer, MD  glucose blood (ACCU-CHEK AVIVA) test strip Use as instructed 01/07/17   Steele Sizer, MD  Lancets (ACCU-CHEK MULTICLIX) lancets Use as instructed 01/09/17   Steele Sizer, MD  traMADol-acetaminophen (ULTRACET) 37.5-325 MG tablet Take 1 tablet by mouth 2 (two) times daily. Patient not taking: Reported  on 03/23/2018 09/05/17   Sable Feil, PA-C    Inpatient Medications: Scheduled Meds: . irbesartan  300 mg Oral Daily   And  . amLODipine  5 mg Oral Daily   And  . hydrochlorothiazide  25 mg Oral Daily  . aspirin EC  81 mg Oral QHS  . brimonidine  1 drop Both Eyes BID   And  . timolol  1 drop Both Eyes BID  . carvedilol  6.25 mg Oral BID  . cyclobenzaprine  5 mg Oral BID AC  . docusate sodium  100 mg Oral BID  . enoxaparin (LOVENOX) injection  40 mg Subcutaneous Q24H  . insulin aspart  0-15 Units Subcutaneous TID WC  . insulin aspart  0-5 Units Subcutaneous QHS  . latanoprost  1 drop Both Eyes QHS  . levothyroxine  50 mcg Oral QAC breakfast  . loratadine  10 mg Oral Daily  . potassium chloride  40 mEq Oral BID  . pravastatin  40 mg Oral q1800  . vitamin B-12  1,000 mcg Oral Daily  . [START ON 03/25/2018] Vitamin D (Ergocalciferol)  50,000 Units Oral Q7 days   Continuous Infusions: . sodium chloride 75 mL/hr at 03/23/18 0359   PRN Meds: acetaminophen **OR** acetaminophen, ondansetron **OR** ondansetron (ZOFRAN) IV  Allergies:   Allergies  Allergen Reactions  . Augmentin [Amoxicillin-Pot Clavulanate] Itching    Has patient had a PCN reaction  causing immediate rash, facial/tongue/throat swelling, SOB or lightheadedness with hypotension: No Has patient had a PCN reaction causing severe rash involving mucus membranes or skin necrosis: No Has patient had a PCN reaction that required hospitalization: No Has patient had a PCN reaction occurring within the last 10 years: No If all of the above answers are "NO", then may proceed with Cephalosporin use.     Social History:   Social History   Socioeconomic History  . Marital status: Divorced    Spouse name: Not on file  . Number of children: Not on file  . Years of education: Not on file  . Highest education level: Not on file  Occupational History  . Not on file  Social Needs  . Financial resource strain: Not on file  . Food insecurity:    Worry: Not on file    Inability: Not on file  . Transportation needs:    Medical: Not on file    Non-medical: Not on file  Tobacco Use  . Smoking status: Never Smoker  . Smokeless tobacco: Never Used  Substance and Sexual Activity  . Alcohol use: No    Alcohol/week: 0.0 oz  . Drug use: No  . Sexual activity: Not Currently  Lifestyle  . Physical activity:    Days per week: Not on file    Minutes per session: Not on file  . Stress: Not on file  Relationships  . Social connections:    Talks on phone: Not on file    Gets together: Not on file    Attends religious service: Not on file    Active member of club or organization: Not on file    Attends meetings of clubs or organizations: Not on file    Relationship status: Not on file  . Intimate partner violence:    Fear of current or ex partner: Not on file    Emotionally abused: Not on file    Physically abused: Not on file    Forced sexual activity: Not on file  Other Topics Concern  . Not on  file  Social History Narrative  . Not on file     Family History:   Family History  Problem Relation Age of Onset  . Diabetes Mother   . Hypertension Mother   . Dementia Mother     . Hypertension Son     ROS:  Review of Systems  Constitutional: Positive for malaise/fatigue. Negative for chills, diaphoresis, fever and weight loss.       "jittery"  HENT: Negative for congestion.   Eyes: Negative for discharge and redness.  Respiratory: Negative for cough, hemoptysis, sputum production, shortness of breath and wheezing.   Cardiovascular: Positive for chest pain, palpitations and leg swelling. Negative for orthopnea, claudication and PND.  Gastrointestinal: Negative for abdominal pain, blood in stool, heartburn, melena, nausea and vomiting.  Genitourinary: Negative for hematuria.  Musculoskeletal: Negative for falls and myalgias.  Skin: Negative for rash.  Neurological: Positive for weakness. Negative for dizziness, tingling, tremors, sensory change, speech change, focal weakness and loss of consciousness.  Endo/Heme/Allergies: Does not bruise/bleed easily.  Psychiatric/Behavioral: Negative for substance abuse. The patient is not nervous/anxious.   All other systems reviewed and are negative.     Physical Exam/Data:   Vitals:   03/23/18 0130 03/23/18 0300 03/23/18 0333 03/23/18 0752  BP: (!) 121/101 135/66 (!) 152/65 (!) 141/80  Pulse: 83 70 65 68  Resp: 16 (!) 23 (!) 24 18  Temp:   97.7 F (36.5 C) 97.9 F (36.6 C)  TempSrc:   Oral Oral  SpO2: 91% 98% 100% 96%  Weight:   209 lb 3.2 oz (94.9 kg)   Height:   _0  (1.651 m)    No intake or output data in the 24 hours ending 03/23/18 0757 Filed Weights   03/22/18 2248 03/23/18 0333  Weight: 213 lb (96.6 kg) 209 lb 3.2 oz (94.9 kg)   Body mass index is 34.81 kg/m.   Physical Exam: General: Well developed, well nourished, in no acute distress. Head: Normocephalic, atraumatic, sclera non-icteric, no xanthomas, nares without discharge.  Neck: Negative for carotid bruits. JVD not elevated. Lungs: Clear bilaterally to auscultation without wheezes, rales, or rhonchi. Breathing is unlabored. Heart: RRR with  S1 S2. I/VI systolic murmur RUSB, no rubs, or gallops appreciated. Abdomen: Soft, non-tender, non-distended with normoactive bowel sounds. No hepatomegaly. No rebound/guarding. No obvious abdominal masses. Msk:  Strength and tone appear normal for age. Extremities: No clubbing or cyanosis. No edema. Distal pedal pulses are 2+ and equal bilaterally. Neuro: Alert and oriented X 3. No facial asymmetry. No focal deficit. Moves all extremities spontaneously. Psych:  Responds to questions appropriately with a normal affect.   EKG:  The EKG 5/19 at 22:47 was personally reviewed and demonstrates: Afib with RVR, 118 bpm, nonspecific st/t changes. Repeat EKG on 5/19 at 23:16 showed NSR, 84 bpm, mo acute st/t changes  Telemetry:  Telemetry was personally reviewed and demonstrates: conversion from Afib with RVR to NSR at 23:11 on 03/22/2018  Weights: Filed Weights   03/22/18 2248 03/23/18 0333  Weight: 213 lb (96.6 kg) 209 lb 3.2 oz (94.9 kg)    Relevant CV Studies: TTE 2017: Study Conclusions  - Left ventricle: The cavity size was normal. There was mild   concentric hypertrophy. Systolic function was normal. The   estimated ejection fraction was in the range of 55% to 60%. Wall   motion was normal; there were no regional wall motion   abnormalities. Doppler parameters are consistent with abnormal   left ventricular relaxation (grade 1  diastolic dysfunction). - Aortic valve: There was mild stenosis. Mean gradient (S): 9 mm   Hg. Valve area (VTI): 1.96 cm^2. - Mitral valve: There was mild regurgitation. - Left atrium: The atrium was mildly dilated. - Pulmonary arteries: Systolic pressure was mildly increased. PA   peak pressure: 42 mm Hg (S).  Laboratory Data:  Chemistry Recent Labs  Lab 03/22/18 2334  NA 139  K 3.1*  CL 106  CO2 22  GLUCOSE 185*  BUN 16  CREATININE 1.15*  CALCIUM 9.3  GFRNONAA 42*  GFRAA 49*  ANIONGAP 11    No results for input(s): PROT, ALBUMIN, AST, ALT,  ALKPHOS, BILITOT in the last 168 hours. Hematology Recent Labs  Lab 03/22/18 2334  WBC 11.8*  RBC 4.62  HGB 12.6  HCT 38.4  MCV 83.1  MCH 27.4  MCHC 32.9  RDW 16.1*  PLT 296   Cardiac Enzymes Recent Labs  Lab 03/22/18 2334 03/23/18 0504  TROPONINI <0.03 <0.03   No results for input(s): TROPIPOC in the last 168 hours.  BNPNo results for input(s): BNP, PROBNP in the last 168 hours.  DDimer No results for input(s): DDIMER in the last 168 hours.  Radiology/Studies:  Dg Chest Port 1 View  Result Date: 03/22/2018 IMPRESSION: No acute findings. Electronically Signed   By: Jeb Levering M.D.   On: 03/22/2018 23:17    Assessment and Plan:   1. New onset Afib with RVR: -Converted to sinus rhythm on 5/19 at 23:11 -Remains in sinus rhythm with heart rates in the 60s bpm -Echo pending -Replete potassium to goal > 4.0 -Check magnesium with recommendation to replete to goal > 2.0 as indicated -TSH normal -Titrate Coreg to 12.5 mg bid -If needed in the future, could stop amlodipine to allow for more BP room for rate control -CHADS2VASc at least 8 (HTN, age x 2, DM, TIA x 2, vascular disease, female) -Start Eliquis 5 mg bid (risks and benefits discussed)  2. Chest pain with moderate risk for cardiac etiology: -Currently, chest pain free -Troponin negative x 2, cycle third troponin to rule out -Echo as above -DOAC in place of ASA -Coreg, statin  3. Mild aortic stenosis/tricuspid regurgitation: -Check echo  4. Pulmonary hypertension: -Check echo  5. DM2: -Check A1c -Per IM  6. HTN: -Blood pressure remains elevated -Increase Coreg as above -Continue amlodipine, irbesartan, HCTZ  7. HLD: -Recent LDL of 61 from 12/2017 -PTA pravastatin   8. Hypokalemia: -Replete to goal > 4.0  9. Hypothyroidism: -PTA levothyroxine  -TSH normal   For questions or updates, please contact [place] Please consult [email or web address] for contact info under Cardiology/STEMI.     Signed, Christell Faith, PA-C [place] Pager: [phone] 03/23/2018, 7:57 AM

## 2018-03-23 NOTE — Care Management (Signed)
Patient placed in observation for chest pain. Negative troponins. Found to be in new onset atrial fib and converted to normal sinus rhythm. Cardiology has consulted

## 2018-03-23 NOTE — Progress Notes (Signed)
[facility] [place] NAME: Susan Bowen    MR#:  [id number]  DATE OF BIRTH:  [birth year]  SUBJECTIVE:  out in the chair. Denies any chest pain. No palpitation. No shortness of breath. Remains in sinus rhythm  REVIEW OF SYSTEMS:   Review of Systems  Constitutional: Negative for chills, fever and weight loss.  HENT: Negative for ear discharge, ear pain and nosebleeds.   Eyes: Negative for blurred vision, pain and discharge.  Respiratory: Negative for sputum production, shortness of breath, wheezing and stridor.   Cardiovascular: Negative for chest pain, palpitations, orthopnea and PND.  Gastrointestinal: Negative for abdominal pain, diarrhea, nausea and vomiting.  Genitourinary: Negative for frequency and urgency.  Musculoskeletal: Negative for back pain and joint pain.  Neurological: Negative for sensory change, speech change, focal weakness and weakness.  Psychiatric/Behavioral: Negative for depression and hallucinations. The patient is not nervous/anxious.    Tolerating Diet:yes Tolerating PT: not needed  DRUG ALLERGIES:   Allergies  Allergen Reactions  . Augmentin [Amoxicillin-Pot Clavulanate] Itching    Has patient had a PCN reaction causing immediate rash, facial/tongue/throat swelling, SOB or lightheadedness with hypotension: No Has patient had a PCN reaction causing severe rash involving mucus membranes or skin necrosis: No Has patient had a PCN reaction that required hospitalization: No Has patient had a PCN reaction occurring within the last 10 years: No If all of the above answers are "NO", then may proceed with Cephalosporin use.     VITALS:  Blood pressure 131/78, pulse 69, temperature 98.7 F (37.1 C), temperature source Oral, resp. rate 18, height 5\' 5"  (1.651 m), weight 94.9 kg (209 lb 3.2 oz), SpO2 98 %.  PHYSICAL EXAMINATION:   Physical Exam  GENERAL:  84 y.o.-year-old patient lying in the bed with no acute  distress.  EYES: Pupils equal, round, reactive to light and accommodation. No scleral icterus. Extraocular muscles intact.  HEENT: Head atraumatic, normocephalic. Oropharynx and nasopharynx clear.  NECK:  Supple, no jugular venous distention. No thyroid enlargement, no tenderness.  LUNGS: Normal breath sounds bilaterally, no wheezing, rales, rhonchi. No use of accessory muscles of respiration.  CARDIOVASCULAR: S1, S2 normal. No murmurs, rubs, or gallops.  ABDOMEN: Soft, nontender, nondistended. Bowel sounds present. No organomegaly or mass.  EXTREMITIES: No cyanosis, clubbing or edema b/l.    NEUROLOGIC: Cranial nerves II through XII are intact. No focal Motor or sensory deficits b/l.   PSYCHIATRIC:  patient is alert and oriented x 3.  SKIN: No obvious rash, lesion, or ulcer.   LABORATORY PANEL:  CBC Recent Labs  Lab 03/22/18 2334  WBC 11.8*  HGB 12.6  HCT 38.4  PLT 296    Chemistries  Recent Labs  Lab 03/22/18 2334 03/23/18 1104  NA 139  --   K 3.1*  --   CL 106  --   CO2 22  --   GLUCOSE 185*  --   BUN 16  --   CREATININE 1.15*  --   CALCIUM 9.3  --   MG  --  1.4*   Cardiac Enzymes Recent Labs  Lab 03/23/18 1104  TROPONINI <0.03   RADIOLOGY:  Dg Chest Port 1 View  Result Date: 03/22/2018 CLINICAL DATA:  Atrial fibrillation. Chest pain, numbness and lightheadedness. EXAM: PORTABLE CHEST 1 VIEW COMPARISON:  CT 07/19/2016, radiograph 03/28/2014 FINDINGS: The cardiomediastinal contours are unchanged with mild cardiomegaly. The lungs are clear. Pulmonary vasculature is normal. No consolidation, pleural effusion, or pneumothorax. No acute  osseous abnormalities are seen. Sclerotic densities in the left anterior fifth rib as seen on prior CT, unchanged dating back to 2015 chest CT. IMPRESSION: No acute findings. Electronically Signed   By: Jeb Levering M.D.   On: 03/22/2018 23:17   ASSESSMENT AND PLAN:  Susan Bowen is a 84 y.o. female below list of chronic medical  conditions presents to the emergency department acute onset of rapid and irregular heartbeat which began at 10 PM tonight.  Patient admits to diaphoresis however denies any chest pain at present but does admit to pressure earlier  1  Chest pain: Secondary to demand ischemia due to tachyarrhythmia.   -patient is chest pain free. Troponin times three negative -cardiology consultation appreciated -continue Coreg and statins  2.  Atrial fibrillation: New onset; rapid ventricular rate resolved.   -patient currently in sinus rhythm  -continue to monitor telemetry.   The patient received an echocardiogram 2 years ago.   -started on DOAC-- eliquis -CHADS2VASc at least 8   3.  Hypertension: Controlled; continue  carvedilol, ARB and hydrochlorothiazide.  4.  Hypothyroidism: Check TSH; continue Synthroid    5.  Diabetes mellitus type 2:  -resume home meds.  6.  Hyperlipidemia: Continue statin therapy  7.  Glaucoma: Continue eyedrops  Case discussed with Care Management/Social Worker. Management plans discussed with the patient, family and they are in agreement.  CODE STATUS: full  DVT Prophylaxis: *eliquis TOTAL TIME TAKING CARE OF THIS PATIENT: *30* minutes.  >50% time spent on counselling and coordination of care  POSSIBLE D/C IN *1=2* DAYS, DEPENDING ON CLINICAL CONDITION.  Note: This dictation was prepared with Dragon dictation along with smaller phrase technology. Any transcriptional errors that result from this process are unintentional.  Fritzi Mandes M.D on 03/23/2018 at 5:04 PM  Between 7am to 6pm - Pager - [phone]  After 6pm go to [email or web address] - password [email or web address] [facility]  Office  [phone]  CC: Primary care physician; Steele Sizer, MD   Patient ID: Susan Bowen, female   DOB: October 08, 1932, 84 y.o.   MRN: [id number]

## 2018-03-23 NOTE — Care Management Obs Status (Signed)
[facility] NOTIFICATION   Patient Details  Name: HELAINE YACKEL MRN: [id number] Date of Birth: February 01, 1932   Medicare Observation Status Notification Given:  Yes    Katrina Stack, RN 03/23/2018, 4:45 PM

## 2018-03-23 NOTE — Care Management (Signed)
Provided patient with 30 day trial Eliquis coupon.  Patient says that her medicaid pays for her meds. Eliquis is on the preferred med list for medicaid.

## 2018-03-23 NOTE — Plan of Care (Signed)
  Problem: Education: Goal: Knowledge of General Education information will improve Outcome: Progressing   Problem: Health Behavior/Discharge Planning: Goal: Ability to manage health-related needs will improve Outcome: Progressing   Problem: Clinical Measurements: Goal: Ability to maintain clinical measurements within normal limits will improve Outcome: Progressing Goal: Diagnostic test results will improve Outcome: Progressing Goal: Cardiovascular complication will be avoided Outcome: Progressing   Problem: Activity: Goal: Risk for activity intolerance will decrease Outcome: Progressing

## 2018-03-23 NOTE — ED Notes (Addendum)
Transport to floor room 246.AS

## 2018-03-24 ENCOUNTER — Observation Stay (HOSPITAL_BASED_OUTPATIENT_CLINIC_OR_DEPARTMENT_OTHER)
Admit: 2018-03-24 | Discharge: 2018-03-24 | Disposition: A | Discharge status: Home / Self Care | Payer: Medicare Other | Attending: Internal Medicine | Admitting: Internal Medicine

## 2018-03-24 ENCOUNTER — Telehealth: Payer: Self-pay | Admitting: *Deleted

## 2018-03-24 ENCOUNTER — Other Ambulatory Visit: Payer: Self-pay | Admitting: Family Medicine

## 2018-03-24 DIAGNOSIS — I4891 Unspecified atrial fibrillation: Secondary | ICD-10-CM

## 2018-03-24 DIAGNOSIS — I340 Nonrheumatic mitral (valve) insufficiency: Secondary | ICD-10-CM

## 2018-03-24 DIAGNOSIS — I272 Pulmonary hypertension, unspecified: Secondary | ICD-10-CM | POA: Diagnosis not present

## 2018-03-24 DIAGNOSIS — I10 Essential (primary) hypertension: Secondary | ICD-10-CM | POA: Diagnosis not present

## 2018-03-24 DIAGNOSIS — E039 Hypothyroidism, unspecified: Secondary | ICD-10-CM | POA: Diagnosis not present

## 2018-03-24 DIAGNOSIS — R079 Chest pain, unspecified: Secondary | ICD-10-CM | POA: Diagnosis not present

## 2018-03-24 LAB — BASIC METABOLIC PANEL
Anion gap: 5 (ref 5–15)
BUN: 16 mg/dL (ref 6–20)
CHLORIDE: 107 mmol/L (ref 101–111)
CO2: 26 mmol/L (ref 22–32)
CREATININE: 1.07 mg/dL — AB (ref 0.44–1.00)
Calcium: 9.1 mg/dL (ref 8.9–10.3)
GFR calc non Af Amer: 46 mL/min — ABNORMAL LOW (ref >60)
GFR, EST AFRICAN AMERICAN: 54 mL/min — AB (ref >60)
Glucose, Bld: 157 mg/dL — ABNORMAL HIGH (ref 65–99)
Potassium: 3.9 mmol/L (ref 3.5–5.1)
SODIUM: 138 mmol/L (ref 135–145)

## 2018-03-24 LAB — GLUCOSE, CAPILLARY
Glucose-Capillary: 126 mg/dL — ABNORMAL HIGH (ref 65–99)
Glucose-Capillary: 209 mg/dL — ABNORMAL HIGH (ref 65–99)

## 2018-03-24 LAB — CBC
HCT: 37.2 % (ref 35.0–47.0)
Hemoglobin: 12.2 g/dL (ref 12.0–16.0)
MCH: 27.1 pg (ref 26.0–34.0)
MCHC: 32.7 g/dL (ref 32.0–36.0)
MCV: 83 fL (ref 80.0–100.0)
PLATELETS: 298 10*3/uL (ref 150–440)
RBC: 4.49 MIL/uL (ref 3.80–5.20)
RDW: 16.3 % — ABNORMAL HIGH (ref 11.5–14.5)
WBC: 9.2 10*3/uL (ref 3.6–11.0)

## 2018-03-24 LAB — ECHOCARDIOGRAM COMPLETE
Height: 65 in
WEIGHTICAEL: 3374.4 [oz_av]

## 2018-03-24 MED ORDER — APIXABAN 5 MG PO TABS: 5.0000 mg | ORAL_TABLET | Freq: Two times a day (BID) | ORAL | 1 refills | Status: DC

## 2018-03-24 MED ORDER — CARVEDILOL 12.5 MG PO TABS: 12.5000 mg | ORAL_TABLET | Freq: Two times a day (BID) | ORAL | 1 refills | Status: DC

## 2018-03-24 MED ORDER — MAGNESIUM SULFATE 2 GM/50ML IV SOLN
2.0000 g | Freq: Once | INTRAVENOUS | Status: AC
  Administered 2018-03-24: 2 g via INTRAVENOUS
  Filled 2018-03-24: qty 50

## 2018-03-24 NOTE — Discharge Summary (Signed)
Susan Bowen at [place] NAME: Susan Bowen    MR#:  [id number]  DATE OF BIRTH:  [birth year]  DATE OF ADMISSION:  03/22/2018 ADMITTING PHYSICIAN: Harrie Foreman, MD  DATE OF DISCHARGE: 03/24/2018  PRIMARY CARE PHYSICIAN: Steele Sizer, MD    ADMISSION DIAGNOSIS:  A-fib ([place]) [I48.91] New onset atrial fibrillation ([place]) [I48.91] Atrial fibrillation with rapid ventricular response ([place]) [I48.91] Chest pain, unspecified type [R07.9]  DISCHARGE DIAGNOSIS:  atrial fibrillation with RVR-- new onset  SECONDARY DIAGNOSIS:   Past Medical History:  Diagnosis Date  . Allergy   . Back spasm   . Bunion   . Carpal tunnel syndrome   . Cataracta   . Diabetes mellitus without complication ([place])   . Generalized osteoarthritis   . Gout   . Heart murmur    no treatment  . Hemorrhoids without complication   . Hyperlipidemia   . Hypertension   . Hypothyroidism   . Impingement syndrome of right shoulder   . Insomnia   . Lipoma   . Lung nodule   . Mini stroke ([place]) 1998   no residual effects  . Neuritis or radiculitis due to rupture of lumbar intervertebral disc   . Obesity   . Osteoporosis   . Proteinuria   . Rotator cuff tear   . Stroke ([place]) 1998   small TIA.  no residual effects  . TIA (transient ischemic attack)   . Tinnitus of both ears   . Unspecified glaucoma(365.9)   . Vitamin D deficiency   . Wedge compression fracture of t11-T12 vertebra, sequela     HOSPITAL COURSE:  Susan B Pooleis a 84 y.o.femalebelow list of chronic medical conditions presents to the emergency department acute onset of rapid and irregular heartbeat which began at 10 PM tonight. Patient admits to diaphoresis however denies any chest painat present but does admit to pressure earlier  1 Chest pain: Secondary to demand ischemia due to tachyarrhythmia.  -patient is chest pain free. Troponin times three negative -cardiology consultation  appreciated -continue Coreg and statins  2. Atrial fibrillation: New onset; rapid ventricular rate resolved.  -patient currently in sinus rhythm  -HR 78-80 The patient received an echocardiogram 2 years ago.  -started on DOAC-- eliquis -CHADS2VASc at least 8  -Echo done--prelim result EF 55%   3. Hypertension: Controlled; continue carvedilol, ARB and hydrochlorothiazide.  4. Hypothyroidism:  - continue Synthroid    5. Diabetes mellitus type 2:  -resume home meds.  6. Hyperlipidemia: Continue statin therapy  7. Glaucoma: Continue eyedrops  Overall appears at baseline D/c home. Pt agreeable   CONSULTS OBTAINED:  Treatment Team:  Wellington Hampshire, MD  DRUG ALLERGIES:   Allergies  Allergen Reactions  . Augmentin [Amoxicillin-Pot Clavulanate] Itching    Has patient had a PCN reaction causing immediate rash, facial/tongue/throat swelling, SOB or lightheadedness with hypotension: No Has patient had a PCN reaction causing severe rash involving mucus membranes or skin necrosis: No Has patient had a PCN reaction that required hospitalization: No Has patient had a PCN reaction occurring within the last 10 years: No If all of the above answers are "NO", then may proceed with Cephalosporin use.     DISCHARGE MEDICATIONS:   Allergies as of 03/24/2018      Reactions   Augmentin [amoxicillin-pot Clavulanate] Itching   Has patient had a PCN reaction causing immediate rash, facial/tongue/throat swelling, SOB or lightheadedness with hypotension: No Has patient had a PCN reaction causing severe  rash involving mucus membranes or skin necrosis: No Has patient had a PCN reaction that required hospitalization: No Has patient had a PCN reaction occurring within the last 10 years: No If all of the above answers are "NO", then may proceed with Cephalosporin use.      Medication List    STOP taking these medications   traMADol-acetaminophen 37.5-325 MG tablet Commonly  known as:  ULTRACET     TAKE these medications   ACCU-CHEK AVIVA PLUS w/Device Kit 1 kit by Does not apply route 3 (three) times daily.   accu-chek multiclix lancets Use as instructed   apixaban 5 MG Tabs tablet Commonly known as:  ELIQUIS Take 1 tablet (5 mg total) by mouth 2 (two) times daily.   aspirin EC 81 MG tablet Take 81 mg at bedtime by mouth.   B-12 1000 MCG Tabs Take 1,000 mcg daily by mouth.   carvedilol 12.5 MG tablet Commonly known as:  COREG Take 1 tablet (12.5 mg total) by mouth 2 (two) times daily. New dose What changed:    medication strength  how much to take   COMBIGAN 0.2-0.5 % ophthalmic solution Generic drug:  brimonidine-timolol Place 1 drop into both eyes 2 (two) times daily.   cyclobenzaprine 10 MG tablet Commonly known as:  FLEXERIL Take 0.5 tablets (5 mg total) by mouth 2 (two) times daily before a meal. What changed:    how much to take  when to take this  reasons to take this   diclofenac sodium 1 % Gel Commonly known as:  VOLTAREN Apply 4 g topically 4 (four) times daily. What changed:    when to take this  reasons to take this   Dulaglutide 1.5 MG/0.5ML Sopn Commonly known as:  TRULICITY Inject 1.5 mg into the skin every Tuesday. What changed:  when to take this   empagliflozin 25 MG Tabs tablet Commonly known as:  JARDIANCE Take 25 mg by mouth daily.   glucose blood test strip Commonly known as:  ACCU-CHEK AVIVA Use as instructed   levothyroxine 50 MCG tablet Commonly known as:  SYNTHROID, LEVOTHROID Take 50 mcg by mouth daily before breakfast.   loratadine 10 MG tablet Commonly known as:  CLARITIN TAKE 1 TABLET (10 MG TOTAL) BY MOUTH DAILY.   metFORMIN 850 MG tablet Commonly known as:  GLUCOPHAGE Take 1 tablet (850 mg total) by mouth 2 (two) times daily.   Olmesartan-amLODIPine-HCTZ 40-5-25 MG Tabs TAKE 1 TABLET BY MOUTH EVERY DAY   pravastatin 40 MG tablet Commonly known as:  PRAVACHOL TAKE 1 TABLET  (40 MG TOTAL) BY MOUTH DAILY.   Travoprost (BAK Free) 0.004 % Soln ophthalmic solution Commonly known as:  TRAVATAN Place 1 drop into both eyes at bedtime.   Vitamin D (Ergocalciferol) 50000 units Caps capsule Commonly known as:  DRISDOL TAKE 1 TABLET BY MOUTH ONCE A WEEK ON WEDNESDAY       If you experience worsening of your admission symptoms, develop shortness of breath, life threatening emergency, suicidal or homicidal thoughts you must seek medical attention immediately by calling 911 or calling your MD immediately  if symptoms less severe.  You Must read complete instructions/literature along with all the possible adverse reactions/side effects for all the Medicines you take and that have been prescribed to you. Take any new Medicines after you have completely understood and accept all the possible adverse reactions/side effects.   Please note  You were cared for by a hospitalist during your hospital stay. If you  have any questions about your discharge medications or the care you received while you were in the hospital after you are discharged, you can call the unit and asked to speak with the hospitalist on call if the hospitalist that took care of you is not available. Once you are discharged, your primary care physician will handle any further medical issues. Please note that NO REFILLS for any discharge medications will be authorized once you are discharged, as it is imperative that you return to your primary care physician (or establish a relationship with a primary care physician if you do not have one) for your aftercare needs so that they can reassess your need for medications and monitor your lab values. Today   SUBJECTIVE   Doing well no cp or sob No palpitations  VITAL SIGNS:  Blood pressure 129/75, pulse 70, temperature 97.8 F (36.6 C), temperature source Oral, resp. rate 14, height _0  (1.651 m), weight 95.7 kg (210 lb 14.4 oz), SpO2 97 %.  I/O:     Intake/Output Summary (Last 24 hours) at 03/24/2018 1018 Last data filed at 03/24/2018 1001 Gross per 24 hour  Intake 720 ml  Output 1700 ml  Net -980 ml    PHYSICAL EXAMINATION:  GENERAL:  84 y.o.-year-old patient lying in the bed with no acute distress.  EYES: Pupils equal, round, reactive to light and accommodation. No scleral icterus. Extraocular muscles intact.  HEENT: Head atraumatic, normocephalic. Oropharynx and nasopharynx clear.  NECK:  Supple, no jugular venous distention. No thyroid enlargement, no tenderness.  LUNGS: Normal breath sounds bilaterally, no wheezing, rales,rhonchi or crepitation. No use of accessory muscles of respiration.  CARDIOVASCULAR: S1, S2 normal. No murmurs, rubs, or gallops.  ABDOMEN: Soft, non-tender, non-distended. Bowel sounds present. No organomegaly or mass.  EXTREMITIES: No pedal edema, cyanosis, or clubbing.  NEUROLOGIC: Cranial nerves II through XII are intact. Muscle strength 5/5 in all extremities. Sensation intact. Gait not checked.  PSYCHIATRIC: The patient is alert and oriented x 3.  SKIN: No obvious rash, lesion, or ulcer.   DATA REVIEW:   CBC  Recent Labs  Lab 03/24/18 0859  WBC 9.2  HGB 12.2  HCT 37.2  PLT 298    Chemistries  Recent Labs  Lab 03/23/18 1104 03/24/18 0859  NA  --  138  K  --  3.9  CL  --  107  CO2  --  26  GLUCOSE  --  157*  BUN  --  16  CREATININE  --  1.07*  CALCIUM  --  9.1  MG 1.4*  --     Microbiology Results   No results found for this or any previous visit (from the past 240 hour(s)).  RADIOLOGY:  Dg Chest Port 1 View  Result Date: 03/22/2018 CLINICAL DATA:  Atrial fibrillation. Chest pain, numbness and lightheadedness. EXAM: PORTABLE CHEST 1 VIEW COMPARISON:  CT 07/19/2016, radiograph 03/28/2014 FINDINGS: The cardiomediastinal contours are unchanged with mild cardiomegaly. The lungs are clear. Pulmonary vasculature is normal. No consolidation, pleural effusion, or pneumothorax. No  acute osseous abnormalities are seen. Sclerotic densities in the left anterior fifth rib as seen on prior CT, unchanged dating back to 2015 chest CT. IMPRESSION: No acute findings. Electronically Signed   By: Jeb Levering M.D.   On: 03/22/2018 23:17     Management plans discussed with the patient, family and they are in agreement.  CODE STATUS:     Code Status Orders  (From admission, onward)  Start     Ordered   03/23/18 0324  Full code  Continuous     03/23/18 0323    Code Status History    This patient has a current code status but no historical code status.      TOTAL TIME TAKING CARE OF THIS PATIENT: *40* minutes.    Fritzi Mandes M.D on 03/24/2018 at 10:18 AM  Between 7am to 6pm - Pager - [phone] After 6pm go to [email or web address] - password [email or web address] [facility]  Office  [phone]  CC: Primary care physician; Steele Sizer, MD

## 2018-03-24 NOTE — Telephone Encounter (Signed)
-----   Message from Frederik Schmidt, RN sent at 03/24/2018 10:51 AM EDT ----- Regarding: FW: tcm/ph   ----- Message ----- From: Blain Pais Sent: 03/24/2018  10:37 AM To: [facility] [address] Triage Subject: tcm/ph                                         6/3 3:30  Murray Hodgkins, NP

## 2018-03-24 NOTE — Progress Notes (Signed)
[name] to be D/C'd Home per MD order.  Discussed prescriptions and follow up appointments with the patient. Prescriptions given to patient, medication list explained in detail. Pt verbalized understanding.  Allergies as of 03/24/2018      Reactions   Augmentin [amoxicillin-pot Clavulanate] Itching   Has patient had a PCN reaction causing immediate rash, facial/tongue/throat swelling, SOB or lightheadedness with hypotension: No Has patient had a PCN reaction causing severe rash involving mucus membranes or skin necrosis: No Has patient had a PCN reaction that required hospitalization: No Has patient had a PCN reaction occurring within the last 10 years: No If all of the above answers are "NO", then may proceed with Cephalosporin use.      Medication List    STOP taking these medications   traMADol-acetaminophen 37.5-325 MG tablet Commonly known as:  ULTRACET     TAKE these medications   ACCU-CHEK AVIVA PLUS w/Device Kit 1 kit by Does not apply route 3 (three) times daily.   accu-chek multiclix lancets Use as instructed   apixaban 5 MG Tabs tablet Commonly known as:  ELIQUIS Take 1 tablet (5 mg total) by mouth 2 (two) times daily.   aspirin EC 81 MG tablet Take 81 mg at bedtime by mouth.   B-12 1000 MCG Tabs Take 1,000 mcg daily by mouth.   carvedilol 12.5 MG tablet Commonly known as:  COREG Take 1 tablet (12.5 mg total) by mouth 2 (two) times daily. New dose What changed:    medication strength  how much to take   COMBIGAN 0.2-0.5 % ophthalmic solution Generic drug:  brimonidine-timolol Place 1 drop into both eyes 2 (two) times daily.   cyclobenzaprine 10 MG tablet Commonly known as:  FLEXERIL Take 0.5 tablets (5 mg total) by mouth 2 (two) times daily before a meal. What changed:    how much to take  when to take this  reasons to take this   diclofenac sodium 1 % Gel Commonly known as:  VOLTAREN Apply 4 g topically 4 (four) times daily. What  changed:    when to take this  reasons to take this   Dulaglutide 1.5 MG/0.5ML Sopn Commonly known as:  TRULICITY Inject 1.5 mg into the skin every Tuesday. What changed:  when to take this   empagliflozin 25 MG Tabs tablet Commonly known as:  JARDIANCE Take 25 mg by mouth daily.   glucose blood test strip Commonly known as:  ACCU-CHEK AVIVA Use as instructed   levothyroxine 50 MCG tablet Commonly known as:  SYNTHROID, LEVOTHROID Take 50 mcg by mouth daily before breakfast.   loratadine 10 MG tablet Commonly known as:  CLARITIN TAKE 1 TABLET (10 MG TOTAL) BY MOUTH DAILY.   metFORMIN 850 MG tablet Commonly known as:  GLUCOPHAGE Take 1 tablet (850 mg total) by mouth 2 (two) times daily.   Olmesartan-amLODIPine-HCTZ 40-5-25 MG Tabs TAKE 1 TABLET BY MOUTH EVERY DAY   pravastatin 40 MG tablet Commonly known as:  PRAVACHOL TAKE 1 TABLET (40 MG TOTAL) BY MOUTH DAILY.   Travoprost (BAK Free) 0.004 % Soln ophthalmic solution Commonly known as:  TRAVATAN Place 1 drop into both eyes at bedtime.   Vitamin D (Ergocalciferol) 50000 units Caps capsule Commonly known as:  DRISDOL TAKE 1 TABLET BY MOUTH ONCE A WEEK ON WEDNESDAY       Vitals:   03/24/18 0407 03/24/18 0742  BP: 125/70 129/75  Pulse: 80 70  Resp: 18 14  Temp: 97.8 F (36.6 C)  97.8 F (36.6 C)  SpO2: 96% 97%    Tele box removed and returned. Skin clean, dry and intact without evidence of skin break down, no evidence of skin tears noted. IV catheter discontinued intact. Site without signs and symptoms of complications. Dressing and pressure applied. Pt denies pain at this time. No complaints noted.  An After Visit Summary was printed and given to the patient. Patient escorted via [place], and D/C home via private auto.  Rolley Sims

## 2018-03-24 NOTE — Progress Notes (Signed)
*  PRELIMINARY RESULTS* Echocardiogram 2D Echocardiogram has been performed.  Susan Bowen 03/24/2018, 9:19 AM

## 2018-03-24 NOTE — Progress Notes (Signed)
Progress Note  Patient Name: Susan Bowen Date of Encounter: 03/24/2018  Primary Cardiologist: Fletcher Anon (last saw in 2017)   Subjective   No complaints this morning. Remains in sinus rhythm. Ruled out. Magnesium low at 1.4. No BMP this morning. TTE pending.   Inpatient Medications    Scheduled Meds: . irbesartan  300 mg Oral Daily   And  . amLODipine  5 mg Oral Daily   And  . hydrochlorothiazide  25 mg Oral Daily  . apixaban  5 mg Oral BID  . brimonidine  1 drop Both Eyes BID   And  . timolol  1 drop Both Eyes BID  . carvedilol  12.5 mg Oral BID  . cyclobenzaprine  5 mg Oral BID AC  . docusate sodium  100 mg Oral BID  . insulin aspart  0-15 Units Subcutaneous TID WC  . insulin aspart  0-5 Units Subcutaneous QHS  . latanoprost  1 drop Both Eyes QHS  . levothyroxine  50 mcg Oral QAC breakfast  . loratadine  10 mg Oral Daily  . pravastatin  40 mg Oral q1800  . sodium chloride flush  3 mL Intravenous Q12H  . vitamin B-12  1,000 mcg Oral Daily  . [START ON 03/25/2018] Vitamin D (Ergocalciferol)  50,000 Units Oral Q7 days   Continuous Infusions:  PRN Meds: acetaminophen **OR** acetaminophen, ondansetron **OR** ondansetron (ZOFRAN) IV   Vital Signs    Vitals:   03/23/18 1642 03/23/18 1935 03/24/18 0407 03/24/18 0742  BP: 131/78 137/84 125/70 129/75  Pulse: 69 80 80 70  Resp: 18  18 14   Temp: 98.7 F (37.1 C) 98 F (36.7 C) 97.8 F (36.6 C) 97.8 F (36.6 C)  TempSrc: Oral Oral Oral Oral  SpO2: 98% 97% 96% 97%  Weight:   210 lb 14.4 oz (95.7 kg)   Height:        Intake/Output Summary (Last 24 hours) at 03/24/2018 0812 Last data filed at 03/24/2018 6387 Gross per 24 hour  Intake 720 ml  Output 2200 ml  Net -1480 ml   Filed Weights   03/22/18 2248 03/23/18 0333 03/24/18 0407  Weight: 213 lb (96.6 kg) 209 lb 3.2 oz (94.9 kg) 210 lb 14.4 oz (95.7 kg)    Telemetry    Remains in sinus rhythm - Personally Reviewed  ECG    n/a - Personally  Reviewed  Physical Exam   GEN: No acute distress.   Neck: No JVD. Cardiac: RRR, I/VI systolic murmur RUSB, no rubs, or gallops.  Respiratory: Clear to auscultation bilaterally.  GI: Soft, nontender, non-distended.   MS: No edema; No deformity. Neuro:  Alert and oriented x 3; Nonfocal.  Psych: Normal affect.  Labs    Chemistry Recent Labs  Lab 03/22/18 2334  NA 139  K 3.1*  CL 106  CO2 22  GLUCOSE 185*  BUN 16  CREATININE 1.15*  CALCIUM 9.3  GFRNONAA 42*  GFRAA 49*  ANIONGAP 11     Hematology Recent Labs  Lab 03/22/18 2334  WBC 11.8*  RBC 4.62  HGB 12.6  HCT 38.4  MCV 83.1  MCH 27.4  MCHC 32.9  RDW 16.1*  PLT 296    Cardiac Enzymes Recent Labs  Lab 03/22/18 2334 03/23/18 0504 03/23/18 1104 03/23/18 1741  TROPONINI <0.03 <0.03 <0.03 <0.03   No results for input(s): TROPIPOC in the last 168 hours.   BNPNo results for input(s): BNP, PROBNP in the last 168 hours.   DDimer No  results for input(s): DDIMER in the last 168 hours.   Radiology    Dg Chest Port 1 View  Result Date: 03/22/2018 IMPRESSION: No acute findings. Electronically Signed   By: Jeb Levering M.D.   On: 03/22/2018 23:17    Cardiac Studies   TTE pending   TTE 2017: Study Conclusions  - Left ventricle: The cavity size was normal. There was mild concentric hypertrophy. Systolic function was normal. The estimated ejection fraction was in the range of 55% to 60%. Wall motion was normal; there were no regional wall motion abnormalities. Doppler parameters are consistent with abnormal left ventricular relaxation (grade 1 diastolic dysfunction). - Aortic valve: There was mild stenosis. Mean gradient (S): 9 mm Hg. Valve area (VTI): 1.96 cm^2. - Mitral valve: There was mild regurgitation. - Left atrium: The atrium was mildly dilated. - Pulmonary arteries: Systolic pressure was mildly increased. PA peak pressure: 42 mm Hg (S).    Patient Profile     84 y.o.  female with history of mild aortic stenosis, moderate tricuspid regurgitation, pulmonary hypertension, DM2, HTN, HLD, TIA, hypothyroidism, obesity, glaucoma on timolol, and back pain with radiculopathy who is being seen today for the evaluation of new onset Afib with RVR and chest pain.  Assessment & Plan    1. New onset Afib with RVR: -Spontaneously converted to sinus rhythm on 5/19 at 23:11 and has remained in sinus rhythm since -Echo pending -Replete magnesium to goal > 2.0 -No BMP today, check with recommendation to replete to goal > 4.0 as indicated -TSH normal -Continue Coreg 12.5 mg bid for rate control -Continue Eliquis 5 mg bid -CHADS2VASc at least 8 (HTN, age x 2, DM, TIA x 2, vascular disease, female)  2. Chest pain with moderate risk for cardiac etiology: -Currently, chest pain free -Ruled out -Await echo, if normal can pursue outpatient ischemic evaluation -DOAC in place of ASA -Coreg and statin  3. Mild aortic stenosis/tricuspid regurgitation: -Echo pending  4. Pulmonary HTN: -Check echo  5. DM2: -Per IM  6. HTN: -Blood pressure well controlled -Continue Coreg, amlodipine, irbesartan, HCTZ  7. HLD: -Recent LDL of 61 from 12/2017 -PTA pravastatin   8. Hypokalemia: -Check BMP -Replete to goal > 4.0 as indicated   9. Hypothyroidism: -PTA levothyroxine  -TSH normal    For questions or updates, please contact [place] Please consult [email or web address] for contact info under Cardiology/STEMI.    Signed, Christell Faith, PA-C [place] Pager: [phone] 03/24/2018, 8:12 AM

## 2018-03-24 NOTE — Telephone Encounter (Signed)
Patient currently admitted at this time. 

## 2018-03-25 ENCOUNTER — Telehealth: Payer: Self-pay

## 2018-03-25 NOTE — Telephone Encounter (Signed)
Patient contacted regarding discharge from [facility] on 03/24/18.   Patient understands to follow up with provider ? On 04/06/18 at 3:30 am at [facility].  Patient understands discharge instructions? Yes  Patient understands medications and regiment? Yes   Patient understands to bring all medications to this visit? Yes

## 2018-03-25 NOTE — Telephone Encounter (Signed)
TOC #1. Called pt to f/u after d/c from [facility] on 03/24/18. Also wanted to confirm her hosp f/u appt w/ Dr. Ancil Boozer on 04/01/18 @ 1:40pm. Discharge planning includes the following:  - Begin Eliquis - Discontinue Tramadol - Change Coreg to 12.5mg  BID - Resume low Na+ diet - F/u with Dr. Ancil Boozer on 04/01/18 - F/u with Murray Hodgkins, NP on 04/06/18 ([place], f/u w/ specialist, etc.).  As part of the TOC f/u call, I am also wanting to discuss/review the above information with the pt to ensure all of the above have been taken care of and to ensure she doesn't have any questions or concerns re: her care/discharge instructions. Unable to LVM requesting returned call d/t no answer.

## 2018-03-26 ENCOUNTER — Telehealth: Payer: Self-pay

## 2018-03-26 NOTE — Telephone Encounter (Signed)
TOC #2. Called pt to f/u after d/c from [facility] on 03/24/18. Also wanted to confirm her hosp f/u appt w/ Dr. Ancil Boozer on 04/01/18 @ 1:40pm. Discharge planning includes the following:  - Begin Eliquis - Discontinue Tramadol - Change Coreg to 12.5mg  BID - Resume low Na+ diet - F/u with Dr. Ancil Boozer on 04/01/18 - F/u with Murray Hodgkins, NP on 04/06/18 ([place], f/u w/ specialist, etc.).  As part of the TOC f/u call, I am also wanting to discuss/review the above information with the pt to ensure all of the above have been taken care of and to ensure she doesn't have any questions or concerns re: her care/discharge instructions. Unable to LVM requesting returned call d/t no answer.

## 2018-04-01 ENCOUNTER — Other Ambulatory Visit: Payer: Self-pay | Admitting: Family Medicine

## 2018-04-01 ENCOUNTER — Ambulatory Visit (INDEPENDENT_AMBULATORY_CARE_PROVIDER_SITE_OTHER): Payer: Medicare Other | Admitting: Family Medicine

## 2018-04-01 ENCOUNTER — Encounter: Payer: Self-pay | Admitting: Family Medicine

## 2018-04-01 VITALS — BP 130/60 | HR 81 | Resp 16 | Ht 66.0 in | Wt 213.9 lb

## 2018-04-01 DIAGNOSIS — E785 Hyperlipidemia, unspecified: Secondary | ICD-10-CM | POA: Diagnosis not present

## 2018-04-01 DIAGNOSIS — I10 Essential (primary) hypertension: Secondary | ICD-10-CM

## 2018-04-01 DIAGNOSIS — I4891 Unspecified atrial fibrillation: Secondary | ICD-10-CM | POA: Diagnosis not present

## 2018-04-01 DIAGNOSIS — R790 Abnormal level of blood mineral: Secondary | ICD-10-CM

## 2018-04-01 DIAGNOSIS — N183 Chronic kidney disease, stage 3 unspecified: Secondary | ICD-10-CM

## 2018-04-01 DIAGNOSIS — M62838 Other muscle spasm: Secondary | ICD-10-CM | POA: Diagnosis not present

## 2018-04-01 DIAGNOSIS — Z09 Encounter for follow-up examination after completed treatment for conditions other than malignant neoplasm: Secondary | ICD-10-CM | POA: Diagnosis not present

## 2018-04-01 MED ORDER — BACLOFEN 10 MG PO TABS: 10.0000 mg | ORAL_TABLET | Freq: Three times a day (TID) | ORAL | 0 refills | Status: DC

## 2018-04-01 MED ORDER — CARVEDILOL 12.5 MG PO TABS: 12.5000 mg | ORAL_TABLET | Freq: Two times a day (BID) | ORAL | 1 refills | Status: DC

## 2018-04-01 MED ORDER — PRAVASTATIN SODIUM 40 MG PO TABS: ORAL_TABLET | ORAL | 1 refills | Status: DC

## 2018-04-01 MED ORDER — OLMESARTAN-AMLODIPINE-HCTZ 40-5-25 MG PO TABS: 1.0000 | ORAL_TABLET | Freq: Every day | ORAL | 1 refills | Status: DC

## 2018-04-01 NOTE — Progress Notes (Signed)
Name: Susan Bowen   MRN: [id number]    DOB: August 06, 1932   Date:04/01/2018       Progress Note  Subjective  Chief Complaint  Chief Complaint  Patient presents with  . Fatigue  . Palpitations    intermittently  . Chest Pain    intermittently  . Headache    frequently    HPI  Hospital follow up: she developed left side drooling on May 18th followed by  chest tightness and SOB on May 19th, 2019. She felt like it was indigestion and symptoms got progressively worse and her son took her to [facility]. BP was high at home and heart rate elevated at 125 at home. She was found to have Afib with rapid ventricular rhythm. She had an Echo with normal EF. She was discharged feeling better and rate controlled with Coreg and Eliquis on 03/24/2018. She has follow up with Dr. Fletcher Anon next week. She states still has episodes of chest palpitation and occasional tightness that lasts a few seconds only. She also has noticed some mild dull headache and also left upper shoulder pain, since discharge, but mild symptoms and better with voltaren gel or rest.   DMII: had labs done at [facility] and hgbA1C is at goal  HTN: at goal also.   CKI: discussed labs with patient, we will continue to monitor.    Patient Active Problem List   Diagnosis Date Noted  . Chest pain 03/23/2018  . Elevated parathyroid hormone 01/01/2018  . Elevated uric acid in blood 04/29/2017  . Plantar fasciitis of left foot 03/12/2016  . Hypothyroidism, postablative 01/23/2016  . Atypical chest pain 09/26/2015  . Aortic stenosis 09/26/2015  . Allergic rhinitis, seasonal 08/08/2015  . Benign hypertension 08/08/2015  . History of pneumonia 08/08/2015  . Carpal tunnel syndrome 08/08/2015  . Cataract 08/08/2015  . Insomnia, persistent 08/08/2015  . Dyslipidemia 08/08/2015  . History of depression 08/08/2015  . Glaucoma 08/08/2015  . Controlled gout 08/08/2015  . Fever blister 08/08/2015  . Bilateral hearing loss 08/08/2015  . Hemorrhoid  08/08/2015  . H/O iron deficiency anemia 08/08/2015  . Benign neoplasm of colon 08/08/2015  . Impingement syndrome of shoulder 08/08/2015  . Osteoporosis, post-menopausal 08/08/2015  . Morbid obesity ([place]) 08/08/2015  . Generalized OA 08/08/2015  . Multinodular goiter 08/08/2015  . Asymptomatic varicose veins 08/08/2015  . Polyp of vocal cord 08/08/2015  . Wedge fracture of thoracic vertebra ([place]) 08/08/2015  . Diabetes mellitus with proteinuric diabetic nephropathy ([place]) 08/08/2015  . LVH (left ventricular hypertrophy) 08/08/2015  . Moderate tricuspid regurgitation 08/08/2015  . History of radioactive iodine thyroid ablation 08/08/2015  . Lung nodule, solitary 05/12/2014  . Chronic cough 05/11/2014  . Vitamin D deficiency 12/20/2009  . Plantar fascial fibromatosis 12/20/2009  . Personal history of fall 07/20/2008  . Cervical radiculitis 05/11/2008    Past Surgical History:  Procedure Laterality Date  . ABDOMINAL HYSTERECTOMY  1986  . APPENDECTOMY    . BREAST SURGERY Bilateral 1983   biopsy of each side, negative  . CARPAL TUNNEL RELEASE    . CATARACT EXTRACTION W/PHACO Left 09/03/2017   Procedure: CATARACT EXTRACTION PHACO AND INTRAOCULAR LENS PLACEMENT (IOC)-LEFT DIABETIC;  Surgeon: Birder Robson, MD;  Location: [facility] ORS;  Service: Ophthalmology;  Laterality: Left;  [place] 01:10.2 AP% 16.7 CDE 11.71 Fluid Pack Lot # [id number]  . CATARACT EXTRACTION W/PHACO Right 09/30/2017   Procedure: CATARACT EXTRACTION PHACO AND INTRAOCULAR LENS PLACEMENT (IOC);  Surgeon: Birder Robson, MD;  Location: [facility] ORS;  Service: Ophthalmology;  Laterality: Right;  [place] 01:35.1 AP% 13.1 CDE 12.44 Fluid pack Lot # [id number]  . COLON SURGERY    . EYE SURGERY Left 09/03/2017   Cataract extraction  . FEMUR FRACTURE SURGERY Left   . FRACTURE SURGERY  2001   left femur  . HEMORROIDECTOMY    . polyp removed from vocal cord      Family History  Problem Relation Age of Onset  . Diabetes Mother    . Hypertension Mother   . Dementia Mother   . Hypertension Son     Social History   Socioeconomic History  . Marital status: Divorced    Spouse name: Not on file  . Number of children: Not on file  . Years of education: Not on file  . Highest education level: Not on file  Occupational History  . Not on file  Social Needs  . Financial resource strain: Not on file  . Food insecurity:    Worry: Not on file    Inability: Not on file  . Transportation needs:    Medical: Not on file    Non-medical: Not on file  Tobacco Use  . Smoking status: Never Smoker  . Smokeless tobacco: Never Used  Substance and Sexual Activity  . Alcohol use: No    Alcohol/week: 0.0 oz  . Drug use: No  . Sexual activity: Not Currently  Lifestyle  . Physical activity:    Days per week: Not on file    Minutes per session: Not on file  . Stress: Not on file  Relationships  . Social connections:    Talks on phone: Not on file    Gets together: Not on file    Attends religious service: Not on file    Active member of club or organization: Not on file    Attends meetings of clubs or organizations: Not on file    Relationship status: Not on file  . Intimate partner violence:    Fear of current or ex partner: Not on file    Emotionally abused: Not on file    Physically abused: Not on file    Forced sexual activity: Not on file  Other Topics Concern  . Not on file  Social History Narrative  . Not on file     Current Outpatient Medications:  .  apixaban (ELIQUIS) 5 MG TABS tablet, Take 1 tablet (5 mg total) by mouth 2 (two) times daily., Disp: 60 tablet, Rfl: 1 .  aspirin EC 81 MG tablet, Take 81 mg at bedtime by mouth., Disp: , Rfl:  .  Blood Glucose Monitoring Suppl (ACCU-CHEK AVIVA PLUS) w/Device KIT, 1 kit by Does not apply route 3 (three) times daily., Disp: 1 kit, Rfl: 0 .  carvedilol (COREG) 12.5 MG tablet, Take 1 tablet (12.5 mg total) by mouth 2 (two) times daily. New dose, Disp: 180  tablet, Rfl: 1 .  COMBIGAN 0.2-0.5 % ophthalmic solution, Place 1 drop into both eyes 2 (two) times daily., Disp: , Rfl:  .  Cyanocobalamin (B-12) 1000 MCG TABS, Take 1,000 mcg daily by mouth., Disp: , Rfl:  .  diclofenac sodium (VOLTAREN) 1 % GEL, Apply 4 g topically 4 (four) times daily. (Patient taking differently: Apply 4 g topically 4 (four) times daily as needed (for pain). ), Disp: 300 g, Rfl: 1 .  Dulaglutide (TRULICITY) 1.5 HF/0.2OV SOPN, Inject 1.5 mg into the skin every Tuesday. (Patient taking differently: Inject 1.5 mg into the skin every Thursday. ),  Disp: 12 pen, Rfl: 1 .  empagliflozin (JARDIANCE) 25 MG TABS tablet, Take 25 mg by mouth daily., Disp: 90 tablet, Rfl: 1 .  glucose blood (ACCU-CHEK AVIVA) test strip, Use as instructed, Disp: 200 each, Rfl: 12 .  Lancets (ACCU-CHEK MULTICLIX) lancets, Use as instructed, Disp: 204 each, Rfl: 12 .  levothyroxine (SYNTHROID, LEVOTHROID) 50 MCG tablet, Take 50 mcg by mouth daily before breakfast. , Disp: , Rfl:  .  loratadine (CLARITIN) 10 MG tablet, TAKE 1 TABLET (10 MG TOTAL) BY MOUTH DAILY., Disp: 90 tablet, Rfl: 1 .  metFORMIN (GLUCOPHAGE) 850 MG tablet, Take 1 tablet (850 mg total) by mouth 2 (two) times daily., Disp: 180 tablet, Rfl: 1 .  Olmesartan-amLODIPine-HCTZ 40-5-25 MG TABS, Take 1 tablet by mouth daily., Disp: 90 tablet, Rfl: 1 .  pravastatin (PRAVACHOL) 40 MG tablet, TAKE 1 TABLET (40 MG TOTAL) BY MOUTH DAILY., Disp: 90 tablet, Rfl: 1 .  Travoprost, BAK Free, (TRAVATAN) 0.004 % SOLN ophthalmic solution, Place 1 drop into both eyes at bedtime., Disp: , Rfl:  .  Vitamin D, Ergocalciferol, (DRISDOL) 50000 units CAPS capsule, TAKE 1 TABLET BY MOUTH ONCE A WEEK ON WEDNESDAY, Disp: 12 capsule, Rfl: 1 .  cyclobenzaprine (FLEXERIL) 10 MG tablet, Take 0.5 tablets (5 mg total) by mouth 2 (two) times daily before a meal. (Patient not taking: Reported on 04/01/2018), Disp: 10 tablet, Rfl: 0  Allergies  Allergen Reactions  . Augmentin  [Amoxicillin-Pot Clavulanate] Itching    Has patient had a PCN reaction causing immediate rash, facial/tongue/throat swelling, SOB or lightheadedness with hypotension: No Has patient had a PCN reaction causing severe rash involving mucus membranes or skin necrosis: No Has patient had a PCN reaction that required hospitalization: No Has patient had a PCN reaction occurring within the last 10 years: No If all of the above answers are "NO", then may proceed with Cephalosporin use.      ROS  Constitutional: Negative for fever or weight change.  Respiratory: Negative for cough and shortness of breath.   Cardiovascular: Negative for chest pain or palpitations.  Gastrointestinal: Negative for abdominal pain, no bowel changes.  Musculoskeletal: Negative for gait problem or joint swelling.  Skin: Negative for rash.  Neurological: Negative for dizziness or headache.  No other specific complaints in a complete review of systems (except as listed in HPI above).  Objective  Vitals:   04/01/18 1355  BP: 130/60  Pulse: 81  Resp: 16  SpO2: 96%  Weight: 213 lb 14.4 oz (97 kg)  Height: _0  (1.676 m)    Body mass index is 34.52 kg/m.  Physical Exam   Constitutional: Patient appears well-developed and well-nourished. Obese  No distress.  HEENT: head atraumatic, normocephalic, pupils equal and reactive to light,  neck supple, throat within normal limits Cardiovascular: Normal rate, regular rhythm and normal heart sounds ( not in afib at this time)   No murmur heard. No BLE edema. Pulmonary/Chest: Effort normal and breath sounds normal. No respiratory distress. Abdominal: Soft.  There is no tenderness. Muscular skeletal: pain during palpation of trapezium muscle, and also pain with rom of neck on left side. No rashes.  Psychiatric: Patient has a normal mood and affect. behavior is normal. Judgment and thought content normal.  Recent Results (from the past 2160 hour(s))  Basic metabolic  panel     Status: Abnormal   Collection Time: 03/22/18 11:34 PM  Result Value Ref Range   Sodium 139 135 - 145 mmol/L   Potassium 3.1 (  L) 3.5 - 5.1 mmol/L   Chloride 106 101 - 111 mmol/L   CO2 22 22 - 32 mmol/L   Glucose, Bld 185 (H) 65 - 99 mg/dL   BUN 16 6 - 20 mg/dL   Creatinine, Ser 1.15 (H) 0.44 - 1.00 mg/dL   Calcium 9.3 8.9 - 10.3 mg/dL   GFR calc non Af Amer 42 (L) >60 mL/min   GFR calc Af Amer 49 (L) >60 mL/min    Comment: (NOTE) The eGFR has been calculated using the CKD EPI equation. This calculation has not been validated in all clinical situations. eGFR's persistently <60 mL/min signify possible Chronic Kidney Disease.    Anion gap 11 5 - 15    Comment: Performed at [facility], [address] [place], [place] [address]  CBC     Status: Abnormal   Collection Time: 03/22/18 11:34 PM  Result Value Ref Range   WBC 11.8 (H) 3.6 - 11.0 K/uL   RBC 4.62 3.80 - 5.20 MIL/uL   Hemoglobin 12.6 12.0 - 16.0 g/dL   HCT 38.4 35.0 - 47.0 %   MCV 83.1 80.0 - 100.0 fL   MCH 27.4 26.0 - 34.0 pg   MCHC 32.9 32.0 - 36.0 g/dL   RDW 16.1 (H) 11.5 - 14.5 %   Platelets 296 150 - 440 K/uL    Comment: Performed at [facility], [address] [place], [place] [address]  Troponin I     Status: None   Collection Time: 03/22/18 11:34 PM  Result Value Ref Range   Troponin I <0.03 <0.03 ng/mL    Comment: Performed at [facility], [address] [place], [place] [address]  TSH     Status: None   Collection Time: 03/23/18  5:04 AM  Result Value Ref Range   TSH 2.686 0.350 - 4.500 uIU/mL    Comment: Performed by a 3rd Generation assay with a functional sensitivity of <=0.01 uIU/mL. Performed at [facility], [address] [place], [place] [address]   Troponin I     Status: None   Collection Time: 03/23/18  5:04 AM  Result Value Ref Range   Troponin I <0.03 <0.03 ng/mL    Comment: Performed at [facility], [place], [place] [address]  Glucose, capillary     Status: Abnormal   Collection Time: 03/23/18  7:58 AM  Result Value Ref Range   Glucose-Capillary 118 (H) 65 - 99 mg/dL   Comment 1 Notify RN    Comment 2 Document in Chart   Troponin I     Status: None   Collection Time: 03/23/18 11:04 AM  Result Value Ref Range   Troponin I <0.03 <0.03 ng/mL    Comment: Performed at [facility], [address], [place], [place] [address]  Magnesium     Status: Abnormal   Collection Time: 03/23/18 11:04 AM  Result Value Ref Range   Magnesium 1.4 (L) 1.7 - 2.4 mg/dL    Comment: Performed at [facility], [address] [place], [place] [address]  Hemoglobin A1c     Status: Abnormal   Collection Time: 03/23/18 11:04 AM  Result Value Ref Range   Hgb A1c MFr Bld 7.4 (H) 4.8 - 5.6 %    Comment: (NOTE) Pre diabetes:          5.7%-6.4% Diabetes:              >6.4% Glycemic control for   <  7.0% adults with diabetes    Mean Plasma Glucose 165.68 mg/dL    Comment: Performed at [facility] [address], [place], [place] [address]  Glucose, capillary     Status: Abnormal   Collection Time: 03/23/18 11:37 AM  Result Value Ref Range   Glucose-Capillary 124 (H) 65 - 99 mg/dL   Comment 1 Notify RN    Comment 2 Document in Chart   Glucose, capillary     Status: Abnormal   Collection Time: 03/23/18  4:41 PM  Result Value Ref Range   Glucose-Capillary 158 (H) 65 - 99 mg/dL   Comment 1 Notify RN    Comment 2 Document in Chart   Troponin I     Status: None   Collection Time: 03/23/18  5:41 PM  Result Value Ref Range   Troponin I <0.03 <0.03 ng/mL    Comment: Performed at [facility], [address] [place], [place] [address]  Glucose, capillary     Status: Abnormal   Collection Time: 03/23/18  8:18 PM  Result Value Ref Range   Glucose-Capillary 139 (H) 65 - 99 mg/dL  Glucose, capillary     Status: Abnormal   Collection Time: 03/24/18  7:41 AM  Result Value Ref  Range   Glucose-Capillary 126 (H) 65 - 99 mg/dL  Basic metabolic panel     Status: Abnormal   Collection Time: 03/24/18  8:59 AM  Result Value Ref Range   Sodium 138 135 - 145 mmol/L   Potassium 3.9 3.5 - 5.1 mmol/L   Chloride 107 101 - 111 mmol/L   CO2 26 22 - 32 mmol/L   Glucose, Bld 157 (H) 65 - 99 mg/dL   BUN 16 6 - 20 mg/dL   Creatinine, Ser 1.07 (H) 0.44 - 1.00 mg/dL   Calcium 9.1 8.9 - 10.3 mg/dL   GFR calc non Af Amer 46 (L) >60 mL/min   GFR calc Af Amer 54 (L) >60 mL/min    Comment: (NOTE) The eGFR has been calculated using the CKD EPI equation. This calculation has not been validated in all clinical situations. eGFR's persistently <60 mL/min signify possible Chronic Kidney Disease.    Anion gap 5 5 - 15    Comment: Performed at [facility], [address] [place], [place] [address]  CBC     Status: Abnormal   Collection Time: 03/24/18  8:59 AM  Result Value Ref Range   WBC 9.2 3.6 - 11.0 K/uL   RBC 4.49 3.80 - 5.20 MIL/uL   Hemoglobin 12.2 12.0 - 16.0 g/dL   HCT 37.2 35.0 - 47.0 %   MCV 83.0 80.0 - 100.0 fL   MCH 27.1 26.0 - 34.0 pg   MCHC 32.7 32.0 - 36.0 g/dL   RDW 16.3 (H) 11.5 - 14.5 %   Platelets 298 150 - 440 K/uL    Comment: Performed at [facility], [address] [place], [place] [address]  ECHOCARDIOGRAM COMPLETE     Status: None   Collection Time: 03/24/18  9:19 AM  Result Value Ref Range   Weight 3,374.4 oz   Height 65 in   BP 129/75 mmHg  Glucose, capillary     Status: Abnormal   Collection Time: 03/24/18 11:44 AM  Result Value Ref Range   Glucose-Capillary 209 (H) 65 - 99 mg/dL    Diabetic Foot Exam: Diabetic Foot Exam - Simple   Simple Foot Form Diabetic Foot exam was performed with the following  findings:  Yes 04/01/2018  2:31 PM  Visual Inspection No deformities, no ulcerations, no other skin breakdown bilaterally:  Yes Sensation Testing Intact to touch and monofilament testing bilaterally:  Yes Pulse  Check Posterior Tibialis and Dorsalis pulse intact bilaterally:  Yes Comments      PHQ2/9: Depression screen [facility] 2/9 08/29/2017 04/29/2017 12/30/2016 08/26/2016 04/22/2016  Decreased Interest 0 0 0 0 0  Down, Depressed, Hopeless 0 0 0 0 0  PHQ - 2 Score 0 0 0 0 0     Fall Risk: Fall Risk  04/01/2018 12/31/2017 08/29/2017 04/29/2017 12/30/2016  Falls in the past year? _0      Functional Status Survey: Is the patient deaf or have difficulty hearing?: No Does the patient have difficulty seeing, even when wearing glasses/contacts?: No Does the patient have difficulty concentrating, remembering, or making decisions?: No Does the patient have difficulty walking or climbing stairs?: No Does the patient have difficulty dressing or bathing?: No Does the patient have difficulty doing errands alone such as visiting a doctor's office or shopping?: No    Assessment & Plan  1. Hospital discharge follow-up  Reviewed records with patient, she is taking aspirin and will discuss it with Dr. Fletcher Anon next week.  Magnesium was low and we will recheck it today   2. New onset a-fib ([place])  Rate controlled, feeling tired since left the hospital , explained it may be from being in bed for 2 days also possible stress from recent hospital stay, continue moving, take medications and we will monitor for now  3. Low magnesium level  - Magnesium  4. Benign hypertension  - carvedilol (COREG) 12.5 MG tablet; Take 1 tablet (12.5 mg total) by mouth 2 (two) times daily. New dose  Dispense: 180 tablet; Refill: 1 - Olmesartan-amLODIPine-HCTZ 40-5-25 MG TABS; Take 1 tablet by mouth daily.  Dispense: 90 tablet; Refill: 1  5. Dyslipidemia  - pravastatin (PRAVACHOL) 40 MG tablet; TAKE 1 TABLET (40 MG TOTAL) BY MOUTH DAILY.  Dispense: 90 tablet; Refill: 1  6. Chronic kidney disease, stage III (moderate) (HCC)  We will monitor for now, continue good control of DM and HTN  7. Muscle spasms of  neck  - baclofen (LIORESAL) 10 MG tablet; Take 1 tablet (10 mg total) by mouth 3 (three) times daily.  Dispense: 30 each; Refill: 0

## 2018-04-02 LAB — MAGNESIUM: MAGNESIUM: 1.5 mg/dL (ref 1.5–2.5)

## 2018-04-06 ENCOUNTER — Encounter: Payer: Self-pay | Admitting: Nurse Practitioner

## 2018-04-06 ENCOUNTER — Ambulatory Visit (INDEPENDENT_AMBULATORY_CARE_PROVIDER_SITE_OTHER): Payer: Medicare Other | Admitting: Nurse Practitioner

## 2018-04-06 ENCOUNTER — Other Ambulatory Visit
Admission: RE | Admit: 2018-04-06 | Discharge: 2018-04-06 | Disposition: A | Discharge status: Home / Self Care | Payer: Medicare Other | Source: Ambulatory Visit | Attending: Nurse Practitioner | Admitting: Nurse Practitioner

## 2018-04-06 VITALS — BP 140/68 | HR 72 | Ht 65.0 in | Wt 213.2 lb

## 2018-04-06 DIAGNOSIS — I480 Paroxysmal atrial fibrillation: Secondary | ICD-10-CM

## 2018-04-06 DIAGNOSIS — E782 Mixed hyperlipidemia: Secondary | ICD-10-CM | POA: Diagnosis not present

## 2018-04-06 DIAGNOSIS — I10 Essential (primary) hypertension: Secondary | ICD-10-CM

## 2018-04-06 DIAGNOSIS — R079 Chest pain, unspecified: Secondary | ICD-10-CM | POA: Diagnosis not present

## 2018-04-06 LAB — CBC WITH DIFFERENTIAL/PLATELET
BASOS ABS: 0.1 10*3/uL (ref 0–0.1)
BASOS PCT: 1 %
EOS PCT: 3 %
Eosinophils Absolute: 0.3 10*3/uL (ref 0–0.7)
HCT: 37.8 % (ref 35.0–47.0)
Hemoglobin: 12.1 g/dL (ref 12.0–16.0)
Lymphocytes Relative: 39 %
Lymphs Abs: 3.9 10*3/uL — ABNORMAL HIGH (ref 1.0–3.6)
MCH: 26.6 pg (ref 26.0–34.0)
MCHC: 31.9 g/dL — ABNORMAL LOW (ref 32.0–36.0)
MCV: 83.4 fL (ref 80.0–100.0)
MONO ABS: 0.7 10*3/uL (ref 0.2–0.9)
Monocytes Relative: 7 %
Neutro Abs: 5 10*3/uL (ref 1.4–6.5)
Neutrophils Relative %: 50 %
PLATELETS: 327 10*3/uL (ref 150–440)
RBC: 4.53 MIL/uL (ref 3.80–5.20)
RDW: 16.2 % — AB (ref 11.5–14.5)
WBC: 9.9 10*3/uL (ref 3.6–11.0)

## 2018-04-06 LAB — BASIC METABOLIC PANEL
Anion gap: 10 (ref 5–15)
BUN: 20 mg/dL (ref 6–20)
CALCIUM: 9.4 mg/dL (ref 8.9–10.3)
CO2: 24 mmol/L (ref 22–32)
CREATININE: 1.2 mg/dL — AB (ref 0.44–1.00)
Chloride: 104 mmol/L (ref 101–111)
GFR calc Af Amer: 47 mL/min — ABNORMAL LOW (ref >60)
GFR, EST NON AFRICAN AMERICAN: 40 mL/min — AB (ref >60)
GLUCOSE: 126 mg/dL — AB (ref 65–99)
POTASSIUM: 4.2 mmol/L (ref 3.5–5.1)
SODIUM: 138 mmol/L (ref 135–145)

## 2018-04-06 MED ORDER — CARVEDILOL 12.5 MG PO TABS: 12.5000 mg | ORAL_TABLET | Freq: Two times a day (BID) | ORAL | 2 refills | Status: DC

## 2018-04-06 MED ORDER — APIXABAN 5 MG PO TABS: 5.0000 mg | ORAL_TABLET | Freq: Two times a day (BID) | ORAL | 2 refills | Status: DC

## 2018-04-06 NOTE — Patient Instructions (Signed)
Medication Instructions:  Your physician has recommended you make the following change in your medication:  1- STOP Aspirin.   Labwork: Your physician recommends that you return for lab work in: TODAY or tomorrow. BMET, CBC - Please go to the [facility]. You will check in at the front desk to the right as you walk into the atrium. Valet Parking is offered if needed.     Testing/Procedures: Your physician has requested that you have a lexiscan myoview. For further information please visit [email or web address]. Please follow instruction sheet, as given.  [facility]  Your caregiver has ordered a Stress Test with nuclear imaging. The purpose of this test is to evaluate the blood supply to your heart muscle. This procedure is referred to as a "Non-Invasive Stress Test." This is because other than having an IV started in your vein, nothing is inserted or "invades" your body. Cardiac stress tests are done to find areas of poor blood flow to the heart by determining the extent of coronary artery disease (CAD). Some patients exercise on a treadmill, which naturally increases the blood flow to your heart, while others who are  unable to walk on a treadmill due to physical limitations have a pharmacologic/chemical stress agent called Lexiscan . This medicine will mimic walking on a treadmill by temporarily increasing your coronary blood flow.   Please note: these test may take anywhere between 2-4 hours to complete  PLEASE REPORT TO [place] AT THE FIRST DESK WILL DIRECT YOU WHERE TO GO  Date of Procedure:_____________________________________  Arrival Time for Procedure:______________________________  Instructions regarding medication:   _XX_ : Hold diabetes medication morning of procedure  - DO NOT TAKE Metformin or Jardiance the morning of.   PLEASE NOTIFY THE OFFICE AT LEAST 48 HOURS IN ADVANCE IF YOU ARE UNABLE TO KEEP YOUR APPOINTMENT.   [phone] AND  PLEASE NOTIFY NUCLEAR MEDICINE AT [facility] AT LEAST 24 HOURS IN ADVANCE IF YOU ARE UNABLE TO KEEP YOUR APPOINTMENT. [phone]  How to prepare for your Myoview test:  1. Do not eat or drink after midnight 2. No caffeine for 24 hours prior to test 3. No smoking 24 hours prior to test. 4. Your medication may be taken with water.  If your doctor stopped a medication because of this test, do not take that medication. 5. Ladies, please do not wear dresses.  Skirts or pants are appropriate. Please wear a short sleeve shirt. 6. No perfume, cologne or lotion. 7. Wear comfortable walking shoes. No heels!       Follow-Up: Your physician recommends that you schedule a follow-up appointment in: [place].   If you need a refill on your cardiac medications before your next appointment, please call your pharmacy.   Cardiac Nuclear Scan A cardiac nuclear scan is a test that measures blood flow to the heart when a person is resting and when he or she is exercising. The test looks for problems such as:  Not enough blood reaching a portion of the heart.  The heart muscle not working normally.  You may need this test if:  You have heart disease.  You have had abnormal lab results.  You have had heart surgery or angioplasty.  You have chest pain.  You have shortness of breath.  In this test, a radioactive dye (tracer) is injected into your bloodstream. After the tracer has traveled to your heart, an imaging device is used to measure how much of  the tracer is absorbed by or distributed to various areas of your heart. This procedure is usually done at a hospital and takes 2-4 hours. Tell a health care provider about:  Any allergies you have.  All medicines you are taking, including vitamins, herbs, eye drops, creams, and over-the-counter medicines.  Any problems you or family members have had with the use of anesthetic medicines.  Any blood disorders you  have.  Any surgeries you have had.  Any medical conditions you have.  Whether you are pregnant or may be pregnant. What are the risks? Generally, this is a safe procedure. However, problems may occur, including:  Serious chest pain and heart attack. This is only a risk if the stress portion of the test is done.  Rapid heartbeat.  Sensation of warmth in your chest. This usually passes quickly.  What happens before the procedure?  Ask your health care provider about changing or stopping your regular medicines. This is especially important if you are taking diabetes medicines or blood thinners.  Remove your jewelry on the day of the procedure. What happens during the procedure?  An IV tube will be inserted into one of your veins.  Your health care provider will inject a small amount of radioactive tracer through the tube.  You will wait for 20-40 minutes while the tracer travels through your bloodstream.  Your heart activity will be monitored with an electrocardiogram (ECG).  You will lie down on an exam table.  Images of your heart will be taken for about 15-20 minutes.  You may be asked to exercise on a treadmill or stationary bike. While you exercise, your heart's activity will be monitored with an ECG, and your blood pressure will be checked. If you are unable to exercise, you may be given a medicine to increase blood flow to parts of your heart.  When blood flow to your heart has peaked, a tracer will again be injected through the IV tube.  After 20-40 minutes, you will get back on the exam table and have more images taken of your heart.  When the procedure is over, your IV tube will be removed. The procedure may vary among health care providers and hospitals. Depending on the type of tracer used, scans may need to be repeated 3-4 hours later. What happens after the procedure?  Unless your health care provider tells you otherwise, you may return to your normal schedule,  including diet, activities, and medicines.  Unless your health care provider tells you otherwise, you may increase your fluid intake. This will help flush the contrast dye from your body. Drink enough fluid to keep your urine clear or pale yellow.  It is up to you to get your test results. Ask your health care provider, or the department that is doing the test, when your results will be ready. Summary  A cardiac nuclear scan measures the blood flow to the heart when a person is resting and when he or she is exercising.  You may need this test if you are at risk for heart disease.  Tell your health care provider if you are pregnant.  Unless your health care provider tells you otherwise, increase your fluid intake. This will help flush the contrast dye from your body. Drink enough fluid to keep your urine clear or pale yellow. This information is not intended to replace advice given to you by your health care provider. Make sure you discuss any questions you have with your health care provider.  Document Released: 11/15/2004 Document Revised: 10/23/2016 Document Reviewed: 09/29/2013 Elsevier Interactive Patient Education  2017 [employer].

## 2018-04-06 NOTE — Progress Notes (Signed)
Office Visit    Patient Name: Susan Bowen Date of Encounter: 04/06/2018  Primary Care Provider:  Steele Sizer, MD Primary Cardiologist:  Kathlyn Sacramento, MD  Chief Complaint    84 year old female with a history of mild aortic stenosis, tricuspid regurgitation, hypertension, hypothyroidism, TIA, and obesity, who was recently admitted with chest pain in the setting of rapid atrial fibrillation.  Past Medical History    Past Medical History:  Diagnosis Date  . Allergy   . Back spasm   . Bunion   . Carpal tunnel syndrome   . Cataracta   . Diabetes mellitus without complication ([place])   . Generalized osteoarthritis   . Gout   . Hemorrhoids without complication   . Hyperlipidemia   . Hypertension   . Hypothyroidism   . Impingement syndrome of right shoulder   . Insomnia   . Lipoma   . Lung nodule   . Neuritis or radiculitis due to rupture of lumbar intervertebral disc   . Obesity   . Osteoporosis   . PAF (paroxysmal atrial fibrillation) ([place])    a. 03/2018 AF w/ RVR-->converted spont.  CHA2DS2VASc = 7-->Eliquis.  . Proteinuria   . Rotator cuff tear   . Systolic murmur    a. 11/2456 Echo: EF 60-65%, no rwma, Gr1 DD, mild MR, mildly dil LA. Nl RV fxn. PASP 21mHg.  .Marland KitchenTIA (transient ischemic attack)   . TIA (transient ischemic attack) 1998   small TIA.  no residual effects  . Tinnitus of both ears   . Unspecified glaucoma(365.9)   . Vitamin D deficiency   . Wedge compression fracture of t11-T12 vertebra, sequela    Past Surgical History:  Procedure Laterality Date  . ABDOMINAL HYSTERECTOMY  1986  . APPENDECTOMY    . BREAST SURGERY Bilateral 1983   biopsy of each side, negative  . CARPAL TUNNEL RELEASE    . CATARACT EXTRACTION W/PHACO Left 09/03/2017   Procedure: CATARACT EXTRACTION PHACO AND INTRAOCULAR LENS PLACEMENT (IOC)-LEFT DIABETIC;  Surgeon: PBirder Robson MD;  Location: [facility] ORS;  Service: Ophthalmology;  Laterality: Left;  [place]:10.2 AP% 16.7 CDE  11.71 Fluid Pack Lot # [id number] . CATARACT EXTRACTION W/PHACO Right 09/30/2017   Procedure: CATARACT EXTRACTION PHACO AND INTRAOCULAR LENS PLACEMENT (IOC);  Surgeon: PBirder Robson MD;  Location: [facility] ORS;  Service: Ophthalmology;  Laterality: Right;  [place]:35.1 AP% 13.1 CDE 12.44 Fluid pack Lot # [id number]  . COLON SURGERY    . EYE SURGERY Left 09/03/2017   Cataract extraction  . FEMUR FRACTURE SURGERY Left   . FRACTURE SURGERY  2001   left femur  . HEMORROIDECTOMY    . polyp removed from vocal cord      Allergies  Allergies  Allergen Reactions  . Augmentin [Amoxicillin-Pot Clavulanate] Itching    Has patient had a PCN reaction causing immediate rash, facial/tongue/throat swelling, SOB or lightheadedness with hypotension: No Has patient had a PCN reaction causing severe rash involving mucus membranes or skin necrosis: No Has patient had a PCN reaction that required hospitalization: No Has patient had a PCN reaction occurring within the last 10 years: No If all of the above answers are "NO", then may proceed with Cephalosporin use.     History of Present Illness    84year old female with the above complex past medical history including mild aortic stenosis, tricuspid regurgitation, hypertension, hypothyroidism, TIA, and obesity.  [facility] and [facility].  She  converted spontaneously.  In the setting of a CHA2DS2VASc of 7, she was placed on Eliquis.  Beta-blocker dose was titrated.  Echo showed normal LV function with grade 1 diastolic dysfunction and no significant valvular disease.  She was subsequently discharged.  Following discharge, she did well though over the past few days, she has noted some fatigue.  She was recently placed on baclofen for left neck and shoulder pain/spasm and thinks it may be contributing to her fatigue.  She has not had any chest pain, dyspnea, PND, orthopnea, dizziness, syncope,  edema, or early satiety.  She has noted occasional, brief palpitations.  No sustained arrhythmias.  Home Medications    Prior to Admission medications   Medication Sig Start Date End Date Taking? Authorizing Provider  apixaban (ELIQUIS) 5 MG TABS tablet Take 1 tablet (5 mg total) by mouth 2 (two) times daily. 04/06/18  Yes Theora Gianotti, NP  baclofen (LIORESAL) 10 MG tablet Take 1 tablet (10 mg total) by mouth 3 (three) times daily. 04/01/18  Yes Sowles, Drue Stager, MD  Blood Glucose Monitoring Suppl (ACCU-CHEK AVIVA PLUS) w/Device KIT 1 kit by Does not apply route 3 (three) times daily. 01/09/17  Yes Sowles, Drue Stager, MD  carvedilol (COREG) 12.5 MG tablet Take 1 tablet (12.5 mg total) by mouth 2 (two) times daily. New dose 04/06/18  Yes Theora Gianotti, NP  COMBIGAN 0.2-0.5 % ophthalmic solution Place 1 drop into both eyes 2 (two) times daily.   Yes [provider]  Cyanocobalamin (B-12) 1000 MCG TABS Take 1,000 mcg daily by mouth.   Yes [provider]  diclofenac sodium (VOLTAREN) 1 % GEL Apply 4 g topically 4 (four) times daily. Patient taking differently: Apply 4 g topically 4 (four) times daily as needed (for pain).  08/26/16  Yes Sowles, Drue Stager, MD  Dulaglutide (TRULICITY) 1.5 ZD/6.6YQ SOPN Inject 1.5 mg into the skin every Tuesday. Patient taking differently: Inject 1.5 mg into the skin every Thursday.  01/06/18  Yes Sowles, Drue Stager, MD  empagliflozin (JARDIANCE) 25 MG TABS tablet Take 25 mg by mouth daily. 12/31/17  Yes Sowles, Drue Stager, MD  glucose blood (ACCU-CHEK AVIVA) test strip Use as instructed 01/07/17  Yes Sowles, Drue Stager, MD  Lancets (ACCU-CHEK MULTICLIX) lancets Use as instructed 01/09/17  Yes Sowles, Drue Stager, MD  levothyroxine (SYNTHROID, LEVOTHROID) 50 MCG tablet Take 50 mcg by mouth daily before breakfast.  12/29/16  Yes [provider]  loratadine (CLARITIN) 10 MG tablet TAKE 1 TABLET (10 MG TOTAL) BY MOUTH DAILY. 01/13/18  Yes Sowles, Drue Stager,  MD  metFORMIN (GLUCOPHAGE) 850 MG tablet Take 1 tablet (850 mg total) by mouth 2 (two) times daily. 12/31/17  Yes Sowles, Drue Stager, MD  Olmesartan-amLODIPine-HCTZ 40-5-25 MG TABS Take 1 tablet by mouth daily. 04/01/18  Yes Sowles, Drue Stager, MD  pravastatin (PRAVACHOL) 40 MG tablet TAKE 1 TABLET (40 MG TOTAL) BY MOUTH DAILY. 04/01/18  Yes Sowles, Drue Stager, MD  Travoprost, BAK Free, (TRAVATAN) 0.004 % SOLN ophthalmic solution Place 1 drop into both eyes at bedtime.   Yes [provider]  Vitamin D, Ergocalciferol, (DRISDOL) 50000 units CAPS capsule TAKE 1 TABLET BY MOUTH ONCE A WEEK ON [facility] 03/05/18  Yes Sowles, Drue Stager, MD    Review of Systems    As above, some fatigue over the past weekend, since starting baclofen.  She has had a few brief palpitations lasting just a second or so.  She denies chest pain, dyspnea, PND, orthopnea, dizziness, syncope, edema, or early satiety.  All other systems  reviewed and are otherwise negative except as noted above.  Physical Exam    VS:  BP 140/68 (BP Location: Left Arm, Patient Position: Sitting, Cuff Size: Normal)   Pulse 72   Ht _0  (1.651 m)   Wt 213 lb 4 oz (96.7 kg)   BMI 35.49 kg/m  , BMI Body mass index is 35.49 kg/m. GEN: Well nourished, well developed, in no acute distress.  HEENT: normal.  Neck: Supple, obese, difficult to gauge JVP, no carotid bruits, or masses. Cardiac: RRR, 2/6 systolic ejection murmur at the left upper sternal border, rubs, or gallops. No clubbing, cyanosis, edema.  Radials/DP/PT 2+ and equal bilaterally.  Respiratory:  Respirations regular and unlabored, clear to auscultation bilaterally. GI: Soft, nontender, nondistended, BS + x 4. MS: no deformity or atrophy. Skin: warm and dry, no rash. Neuro:  Strength and sensation are intact. Psych: Normal affect.  Accessory Clinical Findings    ECG -sinus arrhythmia, 72, nonspecific T changes.  No acute changes.  Assessment & Plan    1.  Paroxysmal atrial  fibrillation: Recently admitted with A. fib and chest pain.  She converted during hospitalization.  Beta-blocker dose was increased to 12.5 twice daily and Eliquis was added.  She has had some brief palpitations but nothing sustained.  We discussed potentially titrating beta-blocker further but given some fatigue, I will hold off on that for right now.  I will refill her Eliquis and also follow-up a basic metabolic panel and CBC.  2.  Chest pain: Patient experienced chest pain in the setting of atrial fibrillation during hospitalization.  Echo showed normal LV function.  As per plan as outlined in the hospital, I will arrange for a Lexiscan Myoview.  I have asked her to stop her aspirin since she is already taking Eliquis.  Continue beta-blocker and statin.   3.  Essential hypertension: Blood pressure moderately elevated today at 140/68.  She says it has been better than this at home.  In the setting of recent fatigue, I will hold off on titrating beta-blocker.  4.  Hyperlipidemia: On statin.  5.  Type 2 diabetes mellitus: On metformin and  Jardiance.  A1c was recently 7.4.  6.  Fatigue:  This started over the weekend after beginning baclofen.  I suspect this is the culprit and have asked her to see how she feels if she doesn't take it.  7.  Dispo:  F/u cbc, bmet, and MV.  F/u in clinic in 3 mos or sooner if necessary.  Murray Hodgkins, NP 04/06/2018, 4:55 PM

## 2018-04-10 ENCOUNTER — Encounter
Admission: RE | Admit: 2018-04-10 | Discharge: 2018-04-10 | Disposition: A | Discharge status: Home / Self Care | Payer: Medicare Other | Source: Ambulatory Visit | Attending: Nurse Practitioner | Admitting: Nurse Practitioner

## 2018-04-10 DIAGNOSIS — I480 Paroxysmal atrial fibrillation: Secondary | ICD-10-CM | POA: Insufficient documentation

## 2018-04-10 DIAGNOSIS — I10 Essential (primary) hypertension: Secondary | ICD-10-CM | POA: Insufficient documentation

## 2018-04-10 DIAGNOSIS — R079 Chest pain, unspecified: Secondary | ICD-10-CM | POA: Insufficient documentation

## 2018-04-10 LAB — NM MYOCAR MULTI W/SPECT W/WALL MOTION / EF
CHL CUP NUCLEAR SSS: 9
CSEPHR: 76 %
LV dias vol: 52 mL (ref 46–106)
LV sys vol: 13 mL
Peak HR: 104 {beats}/min
Rest HR: 74 {beats}/min
SDS: 3
SRS: 6
TID: 1.08

## 2018-04-10 MED ORDER — REGADENOSON 0.4 MG/5ML IV SOLN
0.4000 mg | Freq: Once | INTRAVENOUS | Status: AC
  Administered 2018-04-10: 0.4 mg via INTRAVENOUS

## 2018-04-10 MED ORDER — TECHNETIUM TC 99M TETROFOSMIN IV KIT
32.5700 | PACK | Freq: Once | INTRAVENOUS | Status: AC | PRN
  Administered 2018-04-10: 32.57 via INTRAVENOUS

## 2018-04-10 MED ORDER — TECHNETIUM TC 99M TETROFOSMIN IV KIT
13.0000 | PACK | Freq: Once | INTRAVENOUS | Status: AC | PRN
  Administered 2018-04-10: 13 via INTRAVENOUS

## 2018-04-29 DIAGNOSIS — I272 Pulmonary hypertension, unspecified: Secondary | ICD-10-CM | POA: Diagnosis not present

## 2018-04-29 DIAGNOSIS — R0609 Other forms of dyspnea: Secondary | ICD-10-CM | POA: Diagnosis not present

## 2018-04-29 DIAGNOSIS — G4734 Idiopathic sleep related nonobstructive alveolar hypoventilation: Secondary | ICD-10-CM | POA: Diagnosis not present

## 2018-05-09 ENCOUNTER — Other Ambulatory Visit: Payer: Self-pay | Admitting: Family Medicine

## 2018-05-09 DIAGNOSIS — J302 Other seasonal allergic rhinitis: Secondary | ICD-10-CM

## 2018-05-11 ENCOUNTER — Ambulatory Visit: Payer: Medicare Other | Admitting: Family Medicine

## 2018-05-25 DIAGNOSIS — E119 Type 2 diabetes mellitus without complications: Secondary | ICD-10-CM | POA: Diagnosis not present

## 2018-05-25 LAB — HM DIABETES EYE EXAM

## 2018-05-26 ENCOUNTER — Encounter: Payer: Self-pay | Admitting: Family Medicine

## 2018-06-26 ENCOUNTER — Encounter: Payer: Self-pay | Admitting: Family Medicine

## 2018-06-26 ENCOUNTER — Telehealth: Payer: Self-pay | Admitting: Cardiovascular Disease

## 2018-06-26 ENCOUNTER — Ambulatory Visit (INDEPENDENT_AMBULATORY_CARE_PROVIDER_SITE_OTHER): Payer: Medicare Other | Admitting: Family Medicine

## 2018-06-26 ENCOUNTER — Other Ambulatory Visit: Payer: Self-pay | Admitting: Family Medicine

## 2018-06-26 VITALS — BP 136/64 | HR 87 | Temp 98.2°F | Resp 16 | Ht 66.0 in | Wt 216.2 lb

## 2018-06-26 DIAGNOSIS — E1121 Type 2 diabetes mellitus with diabetic nephropathy: Secondary | ICD-10-CM | POA: Diagnosis not present

## 2018-06-26 DIAGNOSIS — I480 Paroxysmal atrial fibrillation: Secondary | ICD-10-CM | POA: Diagnosis not present

## 2018-06-26 DIAGNOSIS — Z01818 Encounter for other preprocedural examination: Secondary | ICD-10-CM

## 2018-06-26 LAB — POCT GLYCOSYLATED HEMOGLOBIN (HGB A1C): HbA1c, POC (controlled diabetic range): 7.5 % — AB (ref 0.0–7.0)

## 2018-06-26 MED ORDER — GLUCOSE BLOOD VI STRP: ORAL_STRIP | 12 refills | Status: DC

## 2018-06-26 NOTE — Progress Notes (Signed)
Name: Susan Bowen   MRN: [id number]    DOB: 15-Apr-1932   Date:06/26/2018       Progress Note  Subjective  Chief Complaint  Chief Complaint  Patient presents with  . Procedure    Dentist Dr. Freda Munro was concerned about patient being on Eliquis     HPI  PT presents for surgical clearance for tooth extraction.   Eliquis: She sees Dr. Fletcher Anon and Murray Hodgkins NP with cardiology for paroxysmal A-fib and needs clearance to come off of Eliquis prior to the tooth extraction.  She does report feeling "fluttering" in her chest frequently and occasional shortness of breath with exertion.  Denies chest pain, BLE edema, vision changes, headaches.  She does endorse a history of TIA.  She is diabetic, and her last A1C was 7.4%. Taking trulicity once a week, Jardiance 58m daily, and Metformin 8556mBID. Fasting this morning at home was 124. In the last month highest has been 150, Lowest was 119.  She does drink sweet tea and sodas; she is eating whole grain breads now.   Patient Active Problem List   Diagnosis Date Noted  . Chronic kidney disease, stage III (moderate) (HCL[place]05/29/2019  . Chest pain 03/23/2018  . Elevated parathyroid hormone 01/01/2018  . Elevated uric acid in blood 04/29/2017  . Plantar fasciitis of left foot 03/12/2016  . Hypothyroidism, postablative 01/23/2016  . Atypical chest pain 09/26/2015  . Aortic stenosis 09/26/2015  . Allergic rhinitis, seasonal 08/08/2015  . Benign hypertension 08/08/2015  . History of pneumonia 08/08/2015  . Carpal tunnel syndrome 08/08/2015  . Cataract 08/08/2015  . Insomnia, persistent 08/08/2015  . Dyslipidemia 08/08/2015  . History of depression 08/08/2015  . Glaucoma 08/08/2015  . Controlled gout 08/08/2015  . Fever blister 08/08/2015  . Bilateral hearing loss 08/08/2015  . Hemorrhoid 08/08/2015  . H/O iron deficiency anemia 08/08/2015  . Benign neoplasm of colon 08/08/2015  . Impingement syndrome of shoulder 08/08/2015  .  Osteoporosis, post-menopausal 08/08/2015  . Morbid obesity (HCT[place]10/02/2015  . Generalized OA 08/08/2015  . Multinodular goiter 08/08/2015  . Asymptomatic varicose veins 08/08/2015  . Polyp of vocal cord 08/08/2015  . Wedge fracture of thoracic vertebra (HCT[place]10/02/2015  . Diabetes mellitus with proteinuric diabetic nephropathy (HCL[place]10/02/2015  . LVH (left ventricular hypertrophy) 08/08/2015  . Moderate tricuspid regurgitation 08/08/2015  . History of radioactive iodine thyroid ablation 08/08/2015  . Lung nodule, solitary 05/12/2014  . Chronic cough 05/11/2014  . Vitamin D deficiency 12/20/2009  . Plantar fascial fibromatosis 12/20/2009  . Personal history of fall 07/20/2008  . Cervical radiculitis 05/11/2008    Social History   Tobacco Use  . Smoking status: Never Smoker  . Smokeless tobacco: Never Used  Substance Use Topics  . Alcohol use: No    Alcohol/week: 0.0 standard drinks     Current Outpatient Medications:  .  apixaban (ELIQUIS) 5 MG TABS tablet, Take 1 tablet (5 mg total) by mouth 2 (two) times daily., Disp: 180 tablet, Rfl: 2 .  Blood Glucose Monitoring Suppl (ACCU-CHEK AVIVA PLUS) w/Device KIT, 1 kit by Does not apply route 3 (three) times daily., Disp: 1 kit, Rfl: 0 .  carvedilol (COREG) 12.5 MG tablet, Take 1 tablet (12.5 mg total) by mouth 2 (two) times daily. New dose, Disp: 180 tablet, Rfl: 2 .  COMBIGAN 0.2-0.5 % ophthalmic solution, Place 1 drop into both eyes 2 (two) times daily., Disp: , Rfl:  .  Cyanocobalamin (B-12) 1000 MCG TABS, Take 1,000  mcg daily by mouth., Disp: , Rfl:  .  diclofenac sodium (VOLTAREN) 1 % GEL, Apply 4 g topically 4 (four) times daily. (Patient taking differently: Apply 4 g topically 4 (four) times daily as needed (for pain). ), Disp: 300 g, Rfl: 1 .  Dulaglutide (TRULICITY) 1.5 JO/8.3GP SOPN, Inject 1.5 mg into the skin every Tuesday. (Patient taking differently: Inject 1.5 mg into the skin every Thursday. ), Disp: 12 pen, Rfl: 1 .   empagliflozin (JARDIANCE) 25 MG TABS tablet, Take 25 mg by mouth daily., Disp: 90 tablet, Rfl: 1 .  glucose blood (ACCU-CHEK AVIVA) test strip, Use as instructed, Disp: 200 each, Rfl: 12 .  Lancets (ACCU-CHEK MULTICLIX) lancets, Use as instructed, Disp: 204 each, Rfl: 12 .  levothyroxine (SYNTHROID, LEVOTHROID) 50 MCG tablet, Take 50 mcg by mouth daily before breakfast. , Disp: , Rfl:  .  loratadine (CLARITIN) 10 MG tablet, TAKE 1 TABLET (10 MG TOTAL) BY MOUTH DAILY., Disp: 30 tablet, Rfl: 5 .  metFORMIN (GLUCOPHAGE) 850 MG tablet, Take 1 tablet (850 mg total) by mouth 2 (two) times daily., Disp: 180 tablet, Rfl: 1 .  Olmesartan-amLODIPine-HCTZ 40-5-25 MG TABS, Take 1 tablet by mouth daily., Disp: 90 tablet, Rfl: 1 .  pravastatin (PRAVACHOL) 40 MG tablet, TAKE 1 TABLET (40 MG TOTAL) BY MOUTH DAILY., Disp: 90 tablet, Rfl: 1 .  Travoprost, BAK Free, (TRAVATAN) 0.004 % SOLN ophthalmic solution, Place 1 drop into both eyes at bedtime., Disp: , Rfl:  .  Vitamin D, Ergocalciferol, (DRISDOL) 50000 units CAPS capsule, TAKE 1 TABLET BY MOUTH ONCE A WEEK ON WEDNESDAY, Disp: 12 capsule, Rfl: 1 .  baclofen (LIORESAL) 10 MG tablet, Take 1 tablet (10 mg total) by mouth 3 (three) times daily. (Patient not taking: Reported on 06/26/2018), Disp: 30 each, Rfl: 0  Allergies  Allergen Reactions  . Augmentin [Amoxicillin-Pot Clavulanate] Itching    Has patient had a PCN reaction causing immediate rash, facial/tongue/throat swelling, SOB or lightheadedness with hypotension: No Has patient had a PCN reaction causing severe rash involving mucus membranes or skin necrosis: No Has patient had a PCN reaction that required hospitalization: No Has patient had a PCN reaction occurring within the last 10 years: No If all of the above answers are "NO", then may proceed with Cephalosporin use.     ROS  Constitutional: Negative for fever or weight change.  Respiratory: Negative for cough and shortness of breath.     Cardiovascular: Negative for chest pain or palpitations.  Gastrointestinal: Negative for abdominal pain, no bowel changes.  Musculoskeletal: Negative for gait problem or joint swelling.  Skin: Negative for rash.  Neurological: Negative for dizziness or headache.  No other specific complaints in a complete review of systems (except as listed in HPI above).  Objective  Vitals:   06/26/18 1317  BP: 136/64  Pulse: 87  Resp: 16  Temp: 98.2 F (36.8 C)  TempSrc: Oral  SpO2: 97%  Weight: 216 lb 3.2 oz (98.1 kg)  Height: 5' 6"  (1.676 m)   Body mass index is 34.9 kg/m.  Nursing Note and Vital Signs reviewed.  Physical Exam  Constitutional: Patient appears well-developed and well-nourished. No distress.  HENT: Head: Normocephalic and atraumatic. Ears: B TMs ok, no erythema or effusion; Nose: Nose normal. Mouth/Throat: Oropharynx is clear and moist. No oropharyngeal exudate.  Eyes: Conjunctivae and EOM are normal. Pupils are equal, round, and reactive to light. No scleral icterus.  Neck: Normal range of motion. Neck supple. No JVD present. No thyromegaly  present.  Cardiovascular: Normal rate, regular rhythm and normal heart sounds, radial pulses +2 and regular.  No murmur heard. No BLE edema. Pulmonary/Chest: Effort normal and breath sounds normal. No respiratory distress. Musculoskeletal: Normal range of motion, no joint effusions. No gross deformities Neurological: she is alert and oriented to person, place, and time. No cranial nerve deficit. Coordination, balance, strength, speech and gait are normal.  Skin: Skin is warm and dry. No rash noted. No erythema.  Psychiatric: Patient has a normal mood and affect. behavior is normal. Judgment and thought content normal.   No results found for this or any previous visit (from the past 72 hour(s)).  Assessment & Plan  1. Paroxysmal A-fib (HCC) - Contacted Dr. Tyrell Antonio office and left voicemail. I advised patient that because we do not  prescribe her Eliquis, we cannot stop the medication without first having clearance from Cardiology.  She does have cardiology appointment on 07/10/2018.  We will await call back from Cardiology to determine if she needs an appointment sooner than that.  2. Diabetes mellitus with proteinuric diabetic nephropathy (HCC) - POCT glycosylated hemoglobin (Hb A1C) - 7.5% which is a slight increase; we will not change any medications at this time.  I strongly encouraged her to focus on eating a healthy diet and cutting out sweet beverages again.  She is in agreement.  Discussed increased risk of infection with upcoming tooth extraction when BG's are elevated. - glucose blood (ACCU-CHEK AVIVA) test strip; Use as instructed  Dispense: 200 each; Refill: 12  3. Pre-operative clearance - She is cleared for dental work except by cardiology.  We will await their instruction prior to providing full clearance.   Face-to-face time with patient was more than 25 minutes, >50% time spent counseling and coordination of care

## 2018-06-26 NOTE — Telephone Encounter (Signed)
Patient's PCP office calling  States patient will be having a tooth extraction and they will need to know information on Eliquis Office will be faxing clearance form

## 2018-06-26 NOTE — Telephone Encounter (Signed)
° °  [facility] Pre-operative Risk Assessment    Request for surgical clearance:  1. What type of surgery is being performed? Tooth Extraction   2. When is this surgery scheduled? Not listed   3. What type of clearance is required (medical clearance vs. Pharmacy clearance to hold med vs. Both)? medical   4. Are there any medications that need to be held prior to surgery and how long? Not listed    5. Practice name and name of physician performing surgery? [facility] Dr Joesph July   6. What is your office phone number    7.   What is your office fax number   8.   Anesthesia type (None, local, MAC, general) ? Not listed

## 2018-06-30 NOTE — Telephone Encounter (Signed)
Pt daughter is calling back with her dentist [facility], she is not sure of the dentist name. [phone]

## 2018-06-30 NOTE — Telephone Encounter (Signed)
Left a message to call back.

## 2018-07-01 NOTE — Telephone Encounter (Signed)
I left a message for Susan Bowen dental office to please call back and clarify what they are needing in regards to a clearance for the patient (is this cardiac clearance & anticoagulation?).

## 2018-07-02 NOTE — Telephone Encounter (Signed)
We recommend against holding anticoagulation for extraction of one tooth, especially given history of TIA.   Faxed back to number provided.

## 2018-07-02 NOTE — Telephone Encounter (Signed)
Routing to pharmacy. 

## 2018-07-02 NOTE — Telephone Encounter (Signed)
[facility] returning call  States that they are needing anticoagulation clearance  Please call with any additional questions or fax information to [phone]

## 2018-07-02 NOTE — Telephone Encounter (Signed)
Returned call to pt's daughter, she states pt is wanting to have 1 tooth extracted.  Pt has appt with DDS today 07/02/18 in consultation, and has upcoming appt with Dr Fletcher Anon on 07/10/18.  Pt's daughter states pt is planning on waiting to have extraction done until after appt with Dr Fletcher Anon on 07/10/18.  Per Claiborne Billings PharmD pt should not hold Eliquis for 1 tooth extraction.  Pt has afib and a hx of TIA.  Advised pt's daughter of above she verbalized understanding.  Will have Claiborne Billings, PharmD send message to DDS in regards to anticoagulation clearance.

## 2018-07-10 ENCOUNTER — Encounter: Payer: Self-pay | Admitting: Cardiovascular Disease

## 2018-07-10 ENCOUNTER — Ambulatory Visit (INDEPENDENT_AMBULATORY_CARE_PROVIDER_SITE_OTHER): Payer: Medicare Other | Admitting: Cardiovascular Disease

## 2018-07-10 ENCOUNTER — Encounter: Payer: Self-pay | Admitting: *Deleted

## 2018-07-10 VITALS — BP 120/60 | HR 79 | Ht 65.5 in | Wt 216.5 lb

## 2018-07-10 DIAGNOSIS — I480 Paroxysmal atrial fibrillation: Secondary | ICD-10-CM

## 2018-07-10 DIAGNOSIS — I10 Essential (primary) hypertension: Secondary | ICD-10-CM

## 2018-07-10 MED ORDER — OLMESARTAN MEDOXOMIL-HCTZ 40-25 MG PO TABS: 1.0000 | ORAL_TABLET | Freq: Every day | ORAL | 1 refills | Status: DC

## 2018-07-10 MED ORDER — CARVEDILOL 25 MG PO TABS: 25.0000 mg | ORAL_TABLET | Freq: Two times a day (BID) | ORAL | 1 refills | Status: DC

## 2018-07-10 NOTE — Progress Notes (Signed)
Cardiology Office Note   Date:  07/10/2018   ID:  Susan Bowen, DOB [birth year], MRN [id number]  PCP:  Steele Sizer, MD  Cardiologist:   Kathlyn Sacramento, MD   Chief Complaint  Patient presents with  . other    3 month follow up. Meds reviewed by the pt. verbally. Pt. c/o chest pain, shortness of breath and irreg. heart beats. The patient also had indigestion and belching for 3 days about 2 weeks ago.       History of Present Illness: Susan Bowen is a 84 y.o. female who presents for a follow-up visit regarding paroxysmal atrial fibrillation and mild aortic stenosis. She has multiple chronic medical conditions that include hypertension, diabetes and hyperlipidemia.   She is not a smoker and has no family history of premature coronary artery disease. She was hospitalized in May with chest pain in the setting of A. fib with RVR.  The dose of carvedilol was increased to 12.5 mg twice daily and she was started on Eliquis for anticoagulation.  Echocardiogram showed normal LV systolic function with grade 1 diastolic dysfunction. She ultimately underwent outpatient [facility] which showed no evidence of ischemia with normal ejection fraction. She has been doing reasonably well overall although she continues to have intermittent episodes of palpitations associated with mild chest discomfort.  She reports no bleeding issues with Eliquis.   Past Medical History:  Diagnosis Date  . Allergy   . Back spasm   . Bunion   . Carpal tunnel syndrome   . Cataracta   . Diabetes mellitus without complication ([place])   . Generalized osteoarthritis   . Gout   . Hemorrhoids without complication   . Hyperlipidemia   . Hypertension   . Hypothyroidism   . Impingement syndrome of right shoulder   . Insomnia   . Lipoma   . Lung nodule   . Neuritis or radiculitis due to rupture of lumbar intervertebral disc   . Obesity   . Osteoporosis   . PAF (paroxysmal atrial fibrillation) ([place])    a.  03/2018 AF w/ RVR-->converted spont.  CHA2DS2VASc = 7-->Eliquis.  . Proteinuria   . Rotator cuff tear   . Systolic murmur    a. 02/980 Echo: EF 60-65%, no rwma, Gr1 DD, mild MR, mildly dil LA. Nl RV fxn. PASP 9mHg.  .Marland KitchenTIA (transient ischemic attack)   . TIA (transient ischemic attack) 1998   small TIA.  no residual effects  . Tinnitus of both ears   . Unspecified glaucoma(365.9)   . Vitamin D deficiency   . Wedge compression fracture of t11-T12 vertebra, sequela     Past Surgical History:  Procedure Laterality Date  . ABDOMINAL HYSTERECTOMY  1986  . APPENDECTOMY    . BREAST SURGERY Bilateral 1983   biopsy of each side, negative  . CARPAL TUNNEL RELEASE    . CATARACT EXTRACTION W/PHACO Left 09/03/2017   Procedure: CATARACT EXTRACTION PHACO AND INTRAOCULAR LENS PLACEMENT (IOC)-LEFT DIABETIC;  Surgeon: PBirder Robson MD;  Location: [facility] ORS;  Service: Ophthalmology;  Laterality: Left;  [place]:10.2 AP% 16.7 CDE 11.71 Fluid Pack Lot # [id number] . CATARACT EXTRACTION W/PHACO Right 09/30/2017   Procedure: CATARACT EXTRACTION PHACO AND INTRAOCULAR LENS PLACEMENT (IOC);  Surgeon: PBirder Robson MD;  Location: [facility] ORS;  Service: Ophthalmology;  Laterality: Right;  [place]:35.1 AP% 13.1 CDE 12.44 Fluid pack Lot # [id number]  . COLON SURGERY    . EYE SURGERY Left 09/03/2017   Cataract extraction  .  FEMUR FRACTURE SURGERY Left   . FRACTURE SURGERY  2001   left femur  . HEMORROIDECTOMY    . polyp removed from vocal cord       Current Outpatient Medications  Medication Sig Dispense Refill  . apixaban (ELIQUIS) 5 MG TABS tablet Take 1 tablet (5 mg total) by mouth 2 (two) times daily. 180 tablet 2  . baclofen (LIORESAL) 10 MG tablet Take 1 tablet (10 mg total) by mouth 3 (three) times daily. 30 each 0  . Blood Glucose Monitoring Suppl (ACCU-CHEK AVIVA PLUS) w/Device KIT 1 kit by Does not apply route 3 (three) times daily. 1 kit 0  . carvedilol (COREG) 12.5 MG tablet Take 1 tablet  (12.5 mg total) by mouth 2 (two) times daily. New dose 180 tablet 2  . COMBIGAN 0.2-0.5 % ophthalmic solution Place 1 drop into both eyes 2 (two) times daily.    . Cyanocobalamin (B-12) 1000 MCG TABS Take 1,000 mcg daily by mouth.    . diclofenac sodium (VOLTAREN) 1 % GEL Apply 4 g topically 4 (four) times daily. (Patient taking differently: Apply 4 g topically 4 (four) times daily as needed (for pain). ) 300 g 1  . Dulaglutide (TRULICITY) 1.5 JH/4.1DE SOPN Inject 1.5 mg into the skin every Tuesday. (Patient taking differently: Inject 1.5 mg into the skin every Thursday. ) 12 pen 1  . empagliflozin (JARDIANCE) 25 MG TABS tablet Take 25 mg by mouth daily. 90 tablet 1  . glucose blood (ACCU-CHEK AVIVA PLUS) test strip Check fasting glucose once each morning, and check glucose as needed. 100 each 12  . Lancets (ACCU-CHEK MULTICLIX) lancets Use as instructed 204 each 12  . levothyroxine (SYNTHROID, LEVOTHROID) 50 MCG tablet Take 50 mcg by mouth daily before breakfast.     . loratadine (CLARITIN) 10 MG tablet TAKE 1 TABLET (10 MG TOTAL) BY MOUTH DAILY. 30 tablet 5  . metFORMIN (GLUCOPHAGE) 850 MG tablet Take 1 tablet (850 mg total) by mouth 2 (two) times daily. 180 tablet 1  . Olmesartan-amLODIPine-HCTZ 40-5-25 MG TABS Take 1 tablet by mouth daily. 90 tablet 1  . pravastatin (PRAVACHOL) 40 MG tablet TAKE 1 TABLET (40 MG TOTAL) BY MOUTH DAILY. 90 tablet 1  . Travoprost, BAK Free, (TRAVATAN) 0.004 % SOLN ophthalmic solution Place 1 drop into both eyes at bedtime.    . Vitamin D, Ergocalciferol, (DRISDOL) 50000 units CAPS capsule TAKE 1 TABLET BY MOUTH ONCE A WEEK ON WEDNESDAY 12 capsule 1   No current facility-administered medications for this visit.     Allergies:   Augmentin [amoxicillin-pot clavulanate]    Social History:  The patient  reports that she has never smoked. She has never used smokeless tobacco. She reports that she does not drink alcohol or use drugs.   Family History:  The patient's  family history includes Dementia in her mother; Diabetes in her mother; Hypertension in her mother and son.    ROS:  Please see the history of present illness.   Otherwise, review of systems are positive for none.   All other systems are reviewed and negative.    PHYSICAL EXAM: VS:  BP 120/60 (BP Location: Left Arm, Patient Position: Sitting, Cuff Size: Large)   Pulse 79   Ht 5' 5.5" (1.664 m)   Wt 216 lb 8 oz (98.2 kg)   BMI 35.48 kg/m  , BMI Body mass index is 35.48 kg/m. GEN: Well nourished, well developed, in no acute distress  HEENT: normal  Neck: no  JVD, carotid bruits, or masses Cardiac: RRR; no  rubs, or gallops.  Mild bilateral leg edema.  2 out of 6 systolic murmur in the aortic area Respiratory:  clear to auscultation bilaterally, normal work of breathing GI: soft, nontender, nondistended, + BS MS: no deformity or atrophy  Skin: warm and dry, no rash Neuro:  Strength and sensation are intact Psych: euthymic mood, full affect   EKG:  EKG is ordered today. The ekg ordered today demonstrates normal sinus rhythm with sinus arrhythmia nonspecific ST changes.   Recent Labs: 12/31/2017: ALT 8 03/23/2018: TSH 2.686 04/01/2018: Magnesium 1.5 04/06/2018: BUN 20; Creatinine, Ser 1.20; Hemoglobin 12.1; Platelets 327; Potassium 4.2; Sodium 138    Lipid Panel    Component Value Date/Time   CHOL 135 12/31/2017 1218   CHOL 146 12/19/2016 0951   TRIG 164 (H) 12/31/2017 1218   HDL 50 (L) 12/31/2017 1218   HDL 50 12/19/2016 0951   CHOLHDL 2.7 12/31/2017 1218   LDLCALC 61 12/31/2017 1218      Wt Readings from Last 3 Encounters:  07/10/18 216 lb 8 oz (98.2 kg)  06/26/18 216 lb 3.2 oz (98.1 kg)  04/06/18 213 lb 4 oz (96.7 kg)      No flowsheet data found.    ASSESSMENT AND PLAN:  1.  Paroxysmal atrial fibrillation: Currently maintaining in sinus rhythm without an antiarrhythmic medication.  However, she continues to have intermittent episodes of palpitations.  I elected  to increase carvedilol to 25 mg twice daily.  Continue anticoagulation with Eliquis.  Labs in June were overall unremarkable.  Creatinine was mildly elevated at 1.2 but close to baseline.  2. Essential hypertension: Blood pressure is well controlled on current medications.  However, given that I am increasing carvedilol, I elected to stop amlodipine from her combination antihypertensive medication.  She will be only on olmesartan hydrochlorothiazide instead.  3. Hyperlipidemia: Currently on pravastatin with most recent LDL of 61.    Disposition:   FU with me in 6 months  Signed,  Kathlyn Sacramento, MD  07/10/2018 11:04 AM    [place]

## 2018-07-10 NOTE — Patient Instructions (Signed)
Medication Instructions: STOP Olmesartan Amlodipine HCTZ START the Olmesartan HCTZ 40-25mg  INCREASE the Carvedilol to 25 mg bid  If you need a refill on your cardiac medications before your next appointment, please call your pharmacy.   Follow-Up: Your physician wants you to follow-up in 6 months with Dr. Fletcher Anon. You will receive a reminder letter in the mail two months in advance. If you don't receive a letter, please call our office at [phone] to schedule this follow-up appointment.   Thank you for choosing [facility] at [facility]!

## 2018-07-14 ENCOUNTER — Telehealth: Payer: Self-pay | Admitting: Cardiovascular Disease

## 2018-07-14 NOTE — Telephone Encounter (Signed)
   [facility] Pre-operative Risk Assessment    Request for surgical clearance:  1. What type of surgery is being performed? Surgical tooth extraction   2. When is this surgery scheduled? Not listed  3. What type of clearance is required (medical clearance vs. Pharmacy clearance to hold med vs. Both)? Medical and pharmacy  4. Are there any medications that need to be held prior to surgery and how long? Eliquis  5. Practice name and name of physician performing surgery? [facility]  6. What is your office phone number not listed   7.   What is your office fax number not listed  8.   Anesthesia type (None, local, MAC, general) ? local   Susan Bowen 07/14/2018, 10:56 AM  _________________________________________________________________   (provider comments below)

## 2018-07-14 NOTE — Telephone Encounter (Signed)
Duplicate. Please see prior note. This has been faxed and completed.

## 2018-08-03 ENCOUNTER — Encounter: Payer: Self-pay | Admitting: Family Medicine

## 2018-08-03 ENCOUNTER — Ambulatory Visit (INDEPENDENT_AMBULATORY_CARE_PROVIDER_SITE_OTHER): Payer: Medicare Other | Admitting: Family Medicine

## 2018-08-03 VITALS — BP 126/78 | HR 89 | Temp 97.7°F | Resp 16 | Ht 66.0 in | Wt 215.0 lb

## 2018-08-03 DIAGNOSIS — E1121 Type 2 diabetes mellitus with diabetic nephropathy: Secondary | ICD-10-CM | POA: Diagnosis not present

## 2018-08-03 DIAGNOSIS — E890 Postprocedural hypothyroidism: Secondary | ICD-10-CM | POA: Diagnosis not present

## 2018-08-03 DIAGNOSIS — M25512 Pain in left shoulder: Secondary | ICD-10-CM | POA: Diagnosis not present

## 2018-08-03 DIAGNOSIS — I480 Paroxysmal atrial fibrillation: Secondary | ICD-10-CM | POA: Diagnosis not present

## 2018-08-03 DIAGNOSIS — M62838 Other muscle spasm: Secondary | ICD-10-CM | POA: Diagnosis not present

## 2018-08-03 DIAGNOSIS — Z23 Encounter for immunization: Secondary | ICD-10-CM

## 2018-08-03 DIAGNOSIS — N183 Chronic kidney disease, stage 3 unspecified: Secondary | ICD-10-CM

## 2018-08-03 DIAGNOSIS — I10 Essential (primary) hypertension: Secondary | ICD-10-CM

## 2018-08-03 DIAGNOSIS — E1169 Type 2 diabetes mellitus with other specified complication: Secondary | ICD-10-CM | POA: Diagnosis not present

## 2018-08-03 DIAGNOSIS — G8929 Other chronic pain: Secondary | ICD-10-CM

## 2018-08-03 DIAGNOSIS — M159 Polyosteoarthritis, unspecified: Secondary | ICD-10-CM

## 2018-08-03 DIAGNOSIS — E785 Hyperlipidemia, unspecified: Secondary | ICD-10-CM

## 2018-08-03 MED ORDER — EMPAGLIFLOZIN 25 MG PO TABS: 25.0000 mg | ORAL_TABLET | Freq: Every day | ORAL | 1 refills | Status: DC

## 2018-08-03 MED ORDER — METFORMIN HCL 850 MG PO TABS: 850.0000 mg | ORAL_TABLET | Freq: Two times a day (BID) | ORAL | 1 refills | Status: DC

## 2018-08-03 MED ORDER — DULAGLUTIDE 1.5 MG/0.5ML ~~LOC~~ SOAJ: 1.5000 mg | SUBCUTANEOUS | 1 refills | Status: DC

## 2018-08-03 NOTE — Patient Instructions (Signed)
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
## 2018-08-30 ENCOUNTER — Other Ambulatory Visit: Payer: Self-pay | Admitting: Family Medicine

## 2018-08-30 DIAGNOSIS — E559 Vitamin D deficiency, unspecified: Secondary | ICD-10-CM

## 2018-11-02 ENCOUNTER — Other Ambulatory Visit: Payer: Self-pay | Admitting: Family Medicine

## 2018-11-02 DIAGNOSIS — E785 Hyperlipidemia, unspecified: Secondary | ICD-10-CM

## 2018-11-02 NOTE — Telephone Encounter (Signed)
Refill Request for Cholesterol medication. Pravastatin 40 mg  Last physical: None indicated  Lab Results  Component Value Date   CHOL 135 12/31/2017   HDL 50 (L) 12/31/2017   LDLCALC 61 12/31/2017   TRIG 164 (H) 12/31/2017   CHOLHDL 2.7 12/31/2017    Follow up: 12/03/2018

## 2018-11-09 DIAGNOSIS — E890 Postprocedural hypothyroidism: Secondary | ICD-10-CM | POA: Diagnosis not present

## 2018-11-16 DIAGNOSIS — E042 Nontoxic multinodular goiter: Secondary | ICD-10-CM | POA: Diagnosis not present

## 2018-11-16 DIAGNOSIS — E890 Postprocedural hypothyroidism: Secondary | ICD-10-CM | POA: Diagnosis not present

## 2018-11-21 ENCOUNTER — Other Ambulatory Visit: Payer: Self-pay | Admitting: Family Medicine

## 2018-11-21 DIAGNOSIS — J302 Other seasonal allergic rhinitis: Secondary | ICD-10-CM

## 2018-12-04 ENCOUNTER — Ambulatory Visit: Payer: Medicare Other | Admitting: Family Medicine

## 2018-12-13 ENCOUNTER — Other Ambulatory Visit: Payer: Self-pay | Admitting: Family Medicine

## 2018-12-13 DIAGNOSIS — I10 Essential (primary) hypertension: Secondary | ICD-10-CM

## 2018-12-14 ENCOUNTER — Ambulatory Visit
Admission: RE | Admit: 2018-12-14 | Discharge: 2018-12-14 | Disposition: A | Discharge status: Home / Self Care | Payer: Medicare Other | Source: Ambulatory Visit | Attending: Family Medicine | Admitting: Family Medicine

## 2018-12-14 DIAGNOSIS — M810 Age-related osteoporosis without current pathological fracture: Secondary | ICD-10-CM | POA: Diagnosis not present

## 2018-12-14 DIAGNOSIS — M85852 Other specified disorders of bone density and structure, left thigh: Secondary | ICD-10-CM | POA: Diagnosis not present

## 2018-12-14 DIAGNOSIS — S22000D Wedge compression fracture of unspecified thoracic vertebra, subsequent encounter for fracture with routine healing: Secondary | ICD-10-CM | POA: Insufficient documentation

## 2018-12-14 DIAGNOSIS — E890 Postprocedural hypothyroidism: Secondary | ICD-10-CM | POA: Insufficient documentation

## 2018-12-18 DIAGNOSIS — H401132 Primary open-angle glaucoma, bilateral, moderate stage: Secondary | ICD-10-CM | POA: Diagnosis not present

## 2018-12-28 ENCOUNTER — Encounter: Payer: Self-pay | Admitting: Family Medicine

## 2018-12-28 ENCOUNTER — Ambulatory Visit (INDEPENDENT_AMBULATORY_CARE_PROVIDER_SITE_OTHER): Payer: Medicare Other | Admitting: Family Medicine

## 2018-12-28 ENCOUNTER — Encounter

## 2018-12-28 VITALS — BP 124/76 | HR 86 | Temp 98.1°F | Resp 16 | Ht 66.0 in | Wt 218.3 lb

## 2018-12-28 DIAGNOSIS — E1169 Type 2 diabetes mellitus with other specified complication: Secondary | ICD-10-CM

## 2018-12-28 DIAGNOSIS — E559 Vitamin D deficiency, unspecified: Secondary | ICD-10-CM

## 2018-12-28 DIAGNOSIS — I480 Paroxysmal atrial fibrillation: Secondary | ICD-10-CM

## 2018-12-28 DIAGNOSIS — I10 Essential (primary) hypertension: Secondary | ICD-10-CM

## 2018-12-28 DIAGNOSIS — E890 Postprocedural hypothyroidism: Secondary | ICD-10-CM | POA: Diagnosis not present

## 2018-12-28 DIAGNOSIS — E785 Hyperlipidemia, unspecified: Secondary | ICD-10-CM | POA: Diagnosis not present

## 2018-12-28 DIAGNOSIS — I272 Pulmonary hypertension, unspecified: Secondary | ICD-10-CM | POA: Diagnosis not present

## 2018-12-28 DIAGNOSIS — K089 Disorder of teeth and supporting structures, unspecified: Secondary | ICD-10-CM

## 2018-12-28 DIAGNOSIS — E1121 Type 2 diabetes mellitus with diabetic nephropathy: Secondary | ICD-10-CM

## 2018-12-28 DIAGNOSIS — G8929 Other chronic pain: Secondary | ICD-10-CM

## 2018-12-28 DIAGNOSIS — M25512 Pain in left shoulder: Secondary | ICD-10-CM

## 2018-12-28 MED ORDER — EMPAGLIFLOZIN 25 MG PO TABS: 25.0000 mg | ORAL_TABLET | Freq: Every day | ORAL | 1 refills | Status: DC

## 2018-12-28 MED ORDER — METFORMIN HCL 850 MG PO TABS: 850.0000 mg | ORAL_TABLET | Freq: Two times a day (BID) | ORAL | 1 refills | Status: DC

## 2018-12-28 MED ORDER — VITAMIN D (ERGOCALCIFEROL) 1.25 MG (50000 UNIT) PO CAPS: ORAL_CAPSULE | ORAL | 1 refills | Status: DC

## 2018-12-28 MED ORDER — DULAGLUTIDE 1.5 MG/0.5ML ~~LOC~~ SOAJ: 1.5000 mg | SUBCUTANEOUS | 1 refills | Status: DC
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
## 2018-12-28 NOTE — Patient Instructions (Signed)
Osteopenia on bone density, but on CT done in 2015 and 2016 small compression fracture Please discuss with Dr. Gabriel Carina since you have poor dentition and is not a good candidate for biiphosphonate.

## 2018-12-29 LAB — MICROALBUMIN / CREATININE URINE RATIO
Creatinine, Urine: 64 mg/dL (ref 20–275)
Microalb Creat Ratio: 5 mcg/mg creat (ref <30)
Microalb, Ur: 0.3 mg/dL

## 2018-12-29 LAB — COMPLETE METABOLIC PANEL WITH GFR
AG Ratio: 1.3 (calc) (ref 1.0–2.5)
ALBUMIN MSPROF: 3.7 g/dL (ref 3.6–5.1)
ALKALINE PHOSPHATASE (APISO): 55 U/L (ref 37–153)
ALT: 7 U/L (ref 6–29)
AST: 10 U/L (ref 10–35)
BUN/Creatinine Ratio: 17 (calc) (ref 6–22)
BUN: 18 mg/dL (ref 7–25)
CO2: 28 mmol/L (ref 20–32)
Calcium: 9.6 mg/dL (ref 8.6–10.4)
Chloride: 103 mmol/L (ref 98–110)
Creat: 1.09 mg/dL — ABNORMAL HIGH (ref 0.60–0.88)
GFR, Est African American: 54 mL/min/{1.73_m2} — ABNORMAL LOW (ref >=60)
GFR, Est Non African American: 46 mL/min/{1.73_m2} — ABNORMAL LOW (ref >=60)
GLOBULIN: 2.9 g/dL (ref 1.9–3.7)
Glucose, Bld: 136 mg/dL — ABNORMAL HIGH (ref 65–99)
Potassium: 4.3 mmol/L (ref 3.5–5.3)
Sodium: 139 mmol/L (ref 135–146)
Total Bilirubin: 0.3 mg/dL (ref 0.2–1.2)
Total Protein: 6.6 g/dL (ref 6.1–8.1)

## 2018-12-29 LAB — HEMOGLOBIN A1C
Hgb A1c MFr Bld: 8.5 % of total Hgb — ABNORMAL HIGH (ref <5.7)
Mean Plasma Glucose: 197 (calc)
eAG (mmol/L): 10.9 (calc)

## 2018-12-29 LAB — LIPID PANEL
Cholesterol: 140 mg/dL (ref <200)
HDL: 51 mg/dL (ref >=50)
LDL Cholesterol (Calc): 69 mg/dL (calc)
Non-HDL Cholesterol (Calc): 89 mg/dL (calc) (ref <130)
Total CHOL/HDL Ratio: 2.7 (calc) (ref <5.0)
Triglycerides: 121 mg/dL (ref <150)

## 2018-12-31 ENCOUNTER — Other Ambulatory Visit: Payer: Self-pay | Admitting: *Deleted

## 2018-12-31 MED ORDER — OLMESARTAN MEDOXOMIL-HCTZ 40-25 MG PO TABS: 1.0000 | ORAL_TABLET | Freq: Every day | ORAL | 0 refills | Status: DC

## 2019-01-04 ENCOUNTER — Telehealth: Payer: Self-pay

## 2019-01-04 DIAGNOSIS — I10 Essential (primary) hypertension: Secondary | ICD-10-CM

## 2019-01-04 MED ORDER — CARVEDILOL 25 MG PO TABS: 25.0000 mg | ORAL_TABLET | Freq: Two times a day (BID) | ORAL | 3 refills | Status: DC

## 2019-01-04 NOTE — Telephone Encounter (Signed)
Refill sent for Carvedilol 25 mg take one tablet twice a day.

## 2019-01-12 DIAGNOSIS — I491 Atrial premature depolarization: Secondary | ICD-10-CM | POA: Diagnosis not present

## 2019-01-12 DIAGNOSIS — M25512 Pain in left shoulder: Secondary | ICD-10-CM | POA: Diagnosis not present

## 2019-01-12 DIAGNOSIS — I4891 Unspecified atrial fibrillation: Secondary | ICD-10-CM | POA: Diagnosis not present

## 2019-01-13 ENCOUNTER — Emergency Department
Admission: EM | Admit: 2019-01-13 | Discharge: 2019-01-13 | Disposition: A | Discharge status: Home / Self Care | Payer: Medicare Other | Attending: Emergency Medicine | Admitting: Emergency Medicine

## 2019-01-13 ENCOUNTER — Other Ambulatory Visit: Payer: Self-pay

## 2019-01-13 ENCOUNTER — Emergency Department: Payer: Medicare Other

## 2019-01-13 ENCOUNTER — Telehealth: Payer: Self-pay | Admitting: Cardiovascular Disease

## 2019-01-13 DIAGNOSIS — R231 Pallor: Secondary | ICD-10-CM | POA: Diagnosis not present

## 2019-01-13 DIAGNOSIS — I10 Essential (primary) hypertension: Secondary | ICD-10-CM | POA: Diagnosis not present

## 2019-01-13 DIAGNOSIS — Z7984 Long term (current) use of oral hypoglycemic drugs: Secondary | ICD-10-CM | POA: Insufficient documentation

## 2019-01-13 DIAGNOSIS — Z7901 Long term (current) use of anticoagulants: Secondary | ICD-10-CM | POA: Insufficient documentation

## 2019-01-13 DIAGNOSIS — I491 Atrial premature depolarization: Secondary | ICD-10-CM | POA: Insufficient documentation

## 2019-01-13 DIAGNOSIS — E039 Hypothyroidism, unspecified: Secondary | ICD-10-CM | POA: Diagnosis not present

## 2019-01-13 DIAGNOSIS — Z79899 Other long term (current) drug therapy: Secondary | ICD-10-CM | POA: Diagnosis not present

## 2019-01-13 DIAGNOSIS — I4891 Unspecified atrial fibrillation: Secondary | ICD-10-CM | POA: Insufficient documentation

## 2019-01-13 DIAGNOSIS — I129 Hypertensive chronic kidney disease with stage 1 through stage 4 chronic kidney disease, or unspecified chronic kidney disease: Secondary | ICD-10-CM | POA: Insufficient documentation

## 2019-01-13 DIAGNOSIS — E1122 Type 2 diabetes mellitus with diabetic chronic kidney disease: Secondary | ICD-10-CM | POA: Diagnosis not present

## 2019-01-13 DIAGNOSIS — R002 Palpitations: Secondary | ICD-10-CM | POA: Diagnosis not present

## 2019-01-13 DIAGNOSIS — R000 Tachycardia, unspecified: Secondary | ICD-10-CM | POA: Diagnosis present

## 2019-01-13 DIAGNOSIS — N183 Chronic kidney disease, stage 3 (moderate): Secondary | ICD-10-CM | POA: Insufficient documentation

## 2019-01-13 DIAGNOSIS — E1140 Type 2 diabetes mellitus with diabetic neuropathy, unspecified: Secondary | ICD-10-CM | POA: Diagnosis not present

## 2019-01-13 DIAGNOSIS — E1165 Type 2 diabetes mellitus with hyperglycemia: Secondary | ICD-10-CM | POA: Diagnosis not present

## 2019-01-13 LAB — BASIC METABOLIC PANEL
Anion gap: 16 — ABNORMAL HIGH (ref 5–15)
BUN: 19 mg/dL (ref 8–23)
CO2: 21 mmol/L — ABNORMAL LOW (ref 22–32)
Calcium: 10 mg/dL (ref 8.9–10.3)
Chloride: 100 mmol/L (ref 98–111)
Creatinine, Ser: 1.11 mg/dL — ABNORMAL HIGH (ref 0.44–1.00)
GFR calc Af Amer: 52 mL/min — ABNORMAL LOW (ref >60)
GFR calc non Af Amer: 45 mL/min — ABNORMAL LOW (ref >60)
Glucose, Bld: 222 mg/dL — ABNORMAL HIGH (ref 70–99)
POTASSIUM: 4.4 mmol/L (ref 3.5–5.1)
Sodium: 137 mmol/L (ref 135–145)

## 2019-01-13 LAB — CBC
HCT: 38.3 % (ref 36.0–46.0)
Hemoglobin: 11.8 g/dL — ABNORMAL LOW (ref 12.0–15.0)
MCH: 25.9 pg — ABNORMAL LOW (ref 26.0–34.0)
MCHC: 30.8 g/dL (ref 30.0–36.0)
MCV: 84 fL (ref 80.0–100.0)
Platelets: 343 10*3/uL (ref 150–400)
RBC: 4.56 MIL/uL (ref 3.87–5.11)
RDW: 16.5 % — ABNORMAL HIGH (ref 11.5–15.5)
WBC: 10.1 10*3/uL (ref 4.0–10.5)
nRBC: 0 % (ref 0.0–0.2)

## 2019-01-13 LAB — TROPONIN I
Troponin I: <0.03 ng/mL (ref <0.03)
Troponin I: <0.03 ng/mL (ref <0.03)

## 2019-01-13 MED ORDER — APIXABAN 5 MG PO TABS: 5.0000 mg | ORAL_TABLET | Freq: Two times a day (BID) | ORAL | 1 refills | Status: DC

## 2019-01-13 MED ORDER — SODIUM CHLORIDE 0.9 % IV BOLUS
1000.0000 mL | Freq: Once | INTRAVENOUS | Status: AC
  Administered 2019-01-13: 1000 mL via INTRAVENOUS

## 2019-01-13 NOTE — Telephone Encounter (Signed)
Pt states she was in the ED last night due to her being in afib. Please call to discuss. Patient c/o Palpitations:  High priority if patient c/o lightheadedness, shortness of breath, or chest pain  1) How long have you had palpitations/irregular HR/ Afib? Are you having the symptoms now? No, just feels flutters and tightness in chest  2) Are you currently experiencing lightheadedness, SOB or CP? No, patient denies any of these symptoms  3) Do you have a history of afib (atrial fibrillation) or irregular heart rhythm? yes  4) Have you checked your BP or HR? (document readings if available): pt was seen in the ED at [facility] last night  Are you experiencing any other symptoms? Slight headache  Pt c/o of Chest Pain: STAT if CP now or developed within 24 hours 1. Are you having CP right now? No earlier this am.  2. Are you experiencing any other symptoms (ex. SOB, nausea, vomiting, sweating)?  No  3. How long have you been experiencing CP? Tightness for about a week  4. Is your CP continuous or coming and going? Comes and goes , but last night continual  5. Have you taken Nitroglycerin? no ?

## 2019-01-13 NOTE — ED Triage Notes (Signed)
Pt arrived via [facility] from home with c/o feeling like her heart is racing. EMS states pt had a new onset of afib last May and started taking Eliquis. Pt states she is still taking eliquis but she just felt like her heart was racing more today.

## 2019-01-13 NOTE — ED Provider Notes (Signed)
[facility] Emergency Department Provider Note   First MD Initiated Contact with Patient 01/13/19 [phone]     (approximate)  I have reviewed the triage vital signs and the nursing notes.   HISTORY  Chief Complaint Atrial Fibrillation    HPI Susan Bowen is a 85 y.o. female with below list of previous medical conditions including hypertension hyperlipidemia paroxysmal atrial fibrillation presents to the emergency department with concerns that she may be in A. fib as the patient states that she occasionally feels her heart racing.  Patient denies any chest pain no shortness of breath.  Of note patient is currently taking Eliquis.  Patient denies any lower extremity pain or swelling.  Patient denies any cough or fever.        Past Medical History:  Diagnosis Date  . Allergy   . Back spasm   . Bunion   . Carpal tunnel syndrome   . Cataracta   . Diabetes mellitus without complication ([place])   . Generalized osteoarthritis   . Gout   . Hemorrhoids without complication   . Hyperlipidemia   . Hypertension   . Hypothyroidism   . Impingement syndrome of right shoulder   . Insomnia   . Lipoma   . Lung nodule   . Neuritis or radiculitis due to rupture of lumbar intervertebral disc   . Obesity   . Osteoporosis   . PAF (paroxysmal atrial fibrillation) ([place])    a. 03/2018 AF w/ RVR-->converted spont.  CHA2DS2VASc = 7-->Eliquis.  . Proteinuria   . Rotator cuff tear   . Systolic murmur    a. 12/5051 Echo: EF 60-65%, no rwma, Gr1 DD, mild MR, mildly dil LA. Nl RV fxn. PASP 54mHg.  .Marland KitchenTIA (transient ischemic attack)   . TIA (transient ischemic attack) 1998   small TIA.  no residual effects  . Tinnitus of both ears   . Unspecified glaucoma(365.9)   . Vitamin D deficiency   . Wedge compression fracture of t11-T12 vertebra, sequela     Patient Active Problem List   Diagnosis Date Noted  . Pulmonary hypertension, unspecified ([place] 12/28/2018  . Paroxysmal  A-fib ([place] 12/28/2018  . Chronic kidney disease, stage III (moderate) ([place] 04/01/2018  . Elevated parathyroid hormone 01/01/2018  . Elevated uric acid in blood 04/29/2017  . Plantar fasciitis of left foot 03/12/2016  . Hypothyroidism, postablative 01/23/2016  . Aortic stenosis 09/26/2015  . Allergic rhinitis, seasonal 08/08/2015  . Benign hypertension 08/08/2015  . History of pneumonia 08/08/2015  . Carpal tunnel syndrome 08/08/2015  . Cataract 08/08/2015  . Insomnia, persistent 08/08/2015  . Dyslipidemia 08/08/2015  . History of depression 08/08/2015  . Glaucoma 08/08/2015  . Controlled gout 08/08/2015  . Fever blister 08/08/2015  . Bilateral hearing loss 08/08/2015  . Hemorrhoid 08/08/2015  . H/O iron deficiency anemia 08/08/2015  . Benign neoplasm of colon 08/08/2015  . Impingement syndrome of shoulder 08/08/2015  . Osteoporosis, post-menopausal 08/08/2015  . Morbid obesity ([place] 08/08/2015  . Generalized OA 08/08/2015  . Multinodular goiter 08/08/2015  . Asymptomatic varicose veins 08/08/2015  . Polyp of vocal cord 08/08/2015  . History of vertebral compression fracture 08/08/2015  . Diabetes mellitus with proteinuric diabetic nephropathy ([place] 08/08/2015  . LVH (left ventricular hypertrophy) 08/08/2015  . Moderate tricuspid regurgitation 08/08/2015  . History of radioactive iodine thyroid ablation 08/08/2015  . Lung nodule, solitary 05/12/2014  . Chronic cough 05/11/2014  . Vitamin D deficiency 12/20/2009  . Plantar fascial fibromatosis 12/20/2009  .  Personal history of fall 07/20/2008  . Cervical radiculitis 05/11/2008    Past Surgical History:  Procedure Laterality Date  . ABDOMINAL HYSTERECTOMY  1986  . APPENDECTOMY    . BREAST SURGERY Bilateral 1983   biopsy of each side, negative  . CARPAL TUNNEL RELEASE    . CATARACT EXTRACTION W/PHACO Left 09/03/2017   Procedure: CATARACT EXTRACTION PHACO AND INTRAOCULAR LENS PLACEMENT (IOC)-LEFT DIABETIC;  Surgeon:  Birder Robson, MD;  Location: [facility] ORS;  Service: Ophthalmology;  Laterality: Left;  [place] 01:10.2 AP% 16.7 CDE 11.71 Fluid Pack Lot # [id number]  . CATARACT EXTRACTION W/PHACO Right 09/30/2017   Procedure: CATARACT EXTRACTION PHACO AND INTRAOCULAR LENS PLACEMENT (IOC);  Surgeon: Birder Robson, MD;  Location: [facility] ORS;  Service: Ophthalmology;  Laterality: Right;  [place] 01:35.1 AP% 13.1 CDE 12.44 Fluid pack Lot # [id number]  . COLON SURGERY    . EYE SURGERY Left 09/03/2017   Cataract extraction  . FEMUR FRACTURE SURGERY Left   . FRACTURE SURGERY  2001   left femur  . HEMORROIDECTOMY    . polyp removed from vocal cord      Prior to Admission medications   Medication Sig Start Date End Date Taking? Authorizing Provider  apixaban (ELIQUIS) 5 MG TABS tablet Take 1 tablet (5 mg total) by mouth 2 (two) times daily. 04/06/18   Theora Gianotti, NP  baclofen (LIORESAL) 10 MG tablet Take 1 tablet (10 mg total) by mouth 3 (three) times daily. 04/01/18   Steele Sizer, MD  Blood Glucose Monitoring Suppl (ACCU-CHEK AVIVA PLUS) w/Device KIT 1 kit by Does not apply route 3 (three) times daily. 01/09/17   Steele Sizer, MD  carvedilol (COREG) 25 MG tablet Take 1 tablet (25 mg total) by mouth 2 (two) times daily. New dose 01/04/19   Wellington Hampshire, MD  COMBIGAN 0.2-0.5 % ophthalmic solution Place 1 drop into both eyes 2 (two) times daily.    [provider]  Cyanocobalamin (B-12) 1000 MCG TABS Take 1,000 mcg daily by mouth.    [provider]  diclofenac sodium (VOLTAREN) 1 % GEL Apply 4 g topically 4 (four) times daily. Patient taking differently: Apply 4 g topically 4 (four) times daily as needed (for pain).  08/26/16   Steele Sizer, MD  Dulaglutide (TRULICITY) 1.5 XI/3.3AS SOPN Inject 1.5 mg into the skin every Thursday. 12/31/18   Steele Sizer, MD  empagliflozin (JARDIANCE) 25 MG TABS tablet Take 25 mg by mouth daily. 12/28/18   Steele Sizer, MD  glucose blood  (ACCU-CHEK AVIVA PLUS) test strip Check fasting glucose once each morning, and check glucose as needed. 06/26/18   Hubbard Hartshorn, FNP  Lancets (ACCU-CHEK MULTICLIX) lancets Use as instructed 01/09/17   Steele Sizer, MD  levothyroxine (SYNTHROID, LEVOTHROID) 50 MCG tablet Take 50 mcg by mouth daily before breakfast.  12/29/16   [provider]  loratadine (CLARITIN) 10 MG tablet TAKE 1 TABLET (10 MG TOTAL) BY MOUTH DAILY. 11/22/18   Steele Sizer, MD  metFORMIN (GLUCOPHAGE) 850 MG tablet Take 1 tablet (850 mg total) by mouth 2 (two) times daily. 12/28/18   Steele Sizer, MD  olmesartan-hydrochlorothiazide (BENICAR HCT) 40-25 MG tablet Take 1 tablet by mouth daily. 12/31/18   Wellington Hampshire, MD  pravastatin (PRAVACHOL) 40 MG tablet TAKE 1 TABLET BY MOUTH EVERY DAY 11/03/18   Ancil Boozer, Drue Stager, MD  Travoprost, BAK Free, (TRAVATAN) 0.004 % SOLN ophthalmic solution Place 1 drop into both eyes at bedtime.    [provider]  Vitamin D, Ergocalciferol, (DRISDOL) 1.25 MG (50000 UT) CAPS capsule Take weekly 12/28/18   Steele Sizer, MD    Allergies Augmentin [amoxicillin-pot clavulanate]  Family History  Problem Relation Age of Onset  . Diabetes Mother   . Hypertension Mother   . Dementia Mother   . Hypertension Son     Social History Social History   Tobacco Use  . Smoking status: Never Smoker  . Smokeless tobacco: Never Used  Substance Use Topics  . Alcohol use: No    Alcohol/week: 0.0 standard drinks  . Drug use: No    Review of Systems Constitutional: No fever/chills Eyes: No visual changes. ENT: No sore throat. Cardiovascular: Denies chest pain.  Positive for palpitations Respiratory: Denies shortness of breath. Gastrointestinal: No abdominal pain.  No nausea, no vomiting.  No diarrhea.  No constipation. Genitourinary: Negative for dysuria. Musculoskeletal: Negative for neck pain.  Negative for back pain. Integumentary: Negative for rash. Neurological:  Negative for headaches, focal weakness or numbness.   ____________________________________________   PHYSICAL EXAM:  VITAL SIGNS: ED Triage Vitals  Enc Vitals Group     BP 01/13/19 0030 (!) 152/70     Pulse Rate 01/13/19 0030 89     Resp 01/13/19 0030 18     Temp 01/13/19 0030 98 F (36.7 C)     Temp Source 01/13/19 0030 Oral     SpO2 01/13/19 0030 98 %     Weight 01/13/19 0034 97.5 kg (215 lb)     Height 01/13/19 0034 1.6 m (5' 3" )     Head Circumference --      Peak Flow --      Pain Score 01/13/19 0033 6     Pain Loc --      Pain Edu? --      Excl. in [employer]? --     Constitutional: Alert and oriented. Well appearing and in no acute distress. Eyes: Conjunctivae are normal.  Mouth/Throat: Mucous membranes are moist. Oropharynx non-erythematous. Neck: No stridor.   Cardiovascular: Normal rate, regular rhythm. Good peripheral circulation. Grossly normal heart sounds. Respiratory: Normal respiratory effort.  No retractions. Lungs CTAB. Gastrointestinal: Soft and nontender. No distention.  Musculoskeletal: No lower extremity tenderness nor edema. No gross deformities of extremities. Neurologic:  Normal speech and language. No gross focal neurologic deficits are appreciated.  Skin:  Skin is warm, dry and intact. No rash noted. Psychiatric: Mood and affect are normal. Speech and behavior are normal.  ____________________________________________   LABS (all labs ordered are listed, but only abnormal results are displayed)  Labs Reviewed  BASIC METABOLIC PANEL - Abnormal; Notable for the following components:      Result Value   CO2 21 (*)    Glucose, Bld 222 (*)    Creatinine, Ser 1.11 (*)    GFR calc non Af Amer 45 (*)    GFR calc Af Amer 52 (*)    Anion gap 16 (*)    All other components within normal limits  CBC - Abnormal; Notable for the following components:   Hemoglobin 11.8 (*)    MCH 25.9 (*)    RDW 16.5 (*)    All other components within normal limits    TROPONIN I   ____________________________________________  EKG  ED ECG REPORT I, [place] N BROWN, the attending physician, personally viewed and interpreted this ECG.   Date: 01/13/2019  EKG Time: 12:35 AM  Rate: 91  Rhythm: Sinus rhythm with atrial premature contractions.  Axis: Normal  Intervals: Irregular  RR interval  ST&T Change: None  ____________________________________________  RADIOLOGY I, [place] N BROWN, personally viewed and evaluated these images (plain radiographs) as part of my medical decision making, as well as reviewing the written report by the radiologist.  ED MD interpretation: No active disease noted on chest x-ray borderline mild cardiomegaly.  Official radiology report(s): Dg Chest Port 1 View  Result Date: 01/13/2019 CLINICAL DATA:  Palpitations EXAM: PORTABLE CHEST 1 VIEW COMPARISON:  03/22/2018 FINDINGS: Mild cardiomegaly. No acute airspace disease or effusion. No pneumothorax. Possible nodule in the left upper lobe. IMPRESSION: No active disease. Borderline to mild cardiomegaly. Possible nodule in the left upper lobe, recommend chest CT for further evaluation, this could be performed on a nonemergent basis. Electronically Signed   By: Donavan Foil M.D.   On: 01/13/2019 00:54    ____________________________________________  Procedures   ____________________________________________   INITIAL IMPRESSION / MDM / [place] / ED COURSE  As part of my medical decision making, I reviewed the following data within the [place] NUMBER  85 year old female presenting to the emergency department secondary to palpitations.  EKG revealed atrial premature contractions however patient not currently in atrial fibrillation.  Laboratory data unremarkable including troponin x2.  Patient advised to follow-up with cardiologist as planned. ____________________________________________  FINAL CLINICAL IMPRESSION(S) / ED DIAGNOSES  Final  diagnoses:  Atrial fibrillation, unspecified type ([place])  Atrial premature contractions     MEDICATIONS GIVEN DURING THIS VISIT:  Medications - No data to display   ED Discharge Orders    None       Note:  This document was prepared using Dragon voice recognition software and may include unintentional dictation errors.   Gregor Hams, MD 01/13/19 [phone]

## 2019-01-13 NOTE — ED Notes (Signed)
Report off to jenn d. rn

## 2019-01-13 NOTE — Telephone Encounter (Signed)
Call placed to the patient. She stated that she went to the ED early this morning with palpitations and was found to be in and out of atrial fibrillation. She was sent home (in sinus) with instructions to follow up with her cardiologist.   She stated that she feels good now and does not feel any palpitations. She denies chest pain and shortness of breath.  An appointment was made for 01/19/2019 with Dr. Fletcher Anon. She has been advised to call back if she starts feeling any palpitations and feels like she is back in atrial fibrillation. She verbalized her understanding.

## 2019-01-13 NOTE — ED Notes (Signed)
Pt alert  Iv in place.  afib on monitor.

## 2019-01-14 DIAGNOSIS — M25512 Pain in left shoulder: Secondary | ICD-10-CM | POA: Diagnosis not present

## 2019-01-14 DIAGNOSIS — M25612 Stiffness of left shoulder, not elsewhere classified: Secondary | ICD-10-CM | POA: Diagnosis not present

## 2019-01-19 ENCOUNTER — Encounter: Payer: Self-pay | Admitting: Cardiovascular Disease

## 2019-01-19 ENCOUNTER — Other Ambulatory Visit: Payer: Self-pay

## 2019-01-19 ENCOUNTER — Ambulatory Visit (INDEPENDENT_AMBULATORY_CARE_PROVIDER_SITE_OTHER): Payer: Medicare Other | Admitting: Cardiovascular Disease

## 2019-01-19 VITALS — BP 120/58 | HR 87 | Ht 63.0 in | Wt 219.5 lb

## 2019-01-19 DIAGNOSIS — I480 Paroxysmal atrial fibrillation: Secondary | ICD-10-CM

## 2019-01-19 DIAGNOSIS — I10 Essential (primary) hypertension: Secondary | ICD-10-CM

## 2019-01-19 DIAGNOSIS — E785 Hyperlipidemia, unspecified: Secondary | ICD-10-CM

## 2019-01-19 NOTE — Patient Instructions (Signed)

## 2019-01-19 NOTE — Progress Notes (Signed)
Cardiology Office Note   Date:  01/19/2019   ID:  Susan Bowen, DOB Mar 07, 1932, MRN [id number]  PCP:  Steele Sizer, MD  Cardiologist:  Kathlyn Sacramento, MD   Chief Complaint  Patient presents with  . other    Afib c/o sob. Meds reviewed verbally with pt.      History of Present Illness: Susan Bowen is a 85 y.o. female who presents for a follow-up visit regarding paroxysmal atrial fibrillation and mild aortic stenosis. She has multiple chronic medical conditions that include hypertension, diabetes and hyperlipidemia.   She is not a smoker and has no family history of premature coronary artery disease. She was hospitalized in May, 2019 with chest pain in the setting of A. fib with RVR.  The dose of carvedilol was increased to 12.5 mg twice daily and she was started on Eliquis for anticoagulation.  Echocardiogram showed normal LV systolic function with grade 1 diastolic dysfunction. She ultimately underwent outpatient [facility] which showed no evidence of ischemia with normal ejection fraction.  The patient is doing well today. She recently presented to the ED on 01/13/2019 due to atrial fibrillation. The palpitation episode lasted about 7 hours. Her heart rate was 105 before she went to the ED and stayed in the 90s while there. Reports that some nights her heart rate gets low and she can feel it.  EKGs in the ED showed sinus rhythm with PACs.  There was no evidence of atrial fibrillation. Denies any chest pain in the last two days. She does have shortness of breath with exertion. Has been having bilateral leg cramps when she is trying to get up from bed.    Past Medical History:  Diagnosis Date  . Allergy   . Back spasm   . Bunion   . Carpal tunnel syndrome   . Cataracta   . Diabetes mellitus without complication ([place])   . Generalized osteoarthritis   . Gout   . Hemorrhoids without complication   . Hyperlipidemia   . Hypertension   . Hypothyroidism   . Impingement  syndrome of right shoulder   . Insomnia   . Lipoma   . Lung nodule   . Neuritis or radiculitis due to rupture of lumbar intervertebral disc   . Obesity   . Osteoporosis   . PAF (paroxysmal atrial fibrillation) ([place])    a. 03/2018 AF w/ RVR-->converted spont.  CHA2DS2VASc = 7-->Eliquis.  . Proteinuria   . Rotator cuff tear   . Systolic murmur    a. 02/980 Echo: EF 60-65%, no rwma, Gr1 DD, mild MR, mildly dil LA. Nl RV fxn. PASP 68mHg.  .Marland KitchenTIA (transient ischemic attack)   . TIA (transient ischemic attack) 1998   small TIA.  no residual effects  . Tinnitus of both ears   . Unspecified glaucoma(365.9)   . Vitamin D deficiency   . Wedge compression fracture of t11-T12 vertebra, sequela     Past Surgical History:  Procedure Laterality Date  . ABDOMINAL HYSTERECTOMY  1986  . APPENDECTOMY    . BREAST SURGERY Bilateral 1983   biopsy of each side, negative  . CARPAL TUNNEL RELEASE    . CATARACT EXTRACTION W/PHACO Left 09/03/2017   Procedure: CATARACT EXTRACTION PHACO AND INTRAOCULAR LENS PLACEMENT (IOC)-LEFT DIABETIC;  Surgeon: PBirder Robson MD;  Location: [facility] ORS;  Service: Ophthalmology;  Laterality: Left;  [place]:10.2 AP% 16.7 CDE 11.71 Fluid Pack Lot # [id number] . CATARACT EXTRACTION W/PHACO Right 09/30/2017  Procedure: CATARACT EXTRACTION PHACO AND INTRAOCULAR LENS PLACEMENT (IOC);  Surgeon: Birder Robson, MD;  Location: [facility] ORS;  Service: Ophthalmology;  Laterality: Right;  [place] 01:35.1 AP% 13.1 CDE 12.44 Fluid pack Lot # [id number]  . COLON SURGERY    . EYE SURGERY Left 09/03/2017   Cataract extraction  . FEMUR FRACTURE SURGERY Left   . FRACTURE SURGERY  2001   left femur  . HEMORROIDECTOMY    . polyp removed from vocal cord       Current Outpatient Medications  Medication Sig Dispense Refill  . apixaban (ELIQUIS) 5 MG TABS tablet Take 1 tablet (5 mg total) by mouth 2 (two) times daily. 180 tablet 1  . Blood Glucose Monitoring Suppl (ACCU-CHEK AVIVA PLUS)  w/Device KIT 1 kit by Does not apply route 3 (three) times daily. 1 kit 0  . carvedilol (COREG) 25 MG tablet Take 1 tablet (25 mg total) by mouth 2 (two) times daily. New dose 180 tablet 3  . COMBIGAN 0.2-0.5 % ophthalmic solution Place 1 drop into both eyes 2 (two) times daily.    . Cyanocobalamin (B-12) 1000 MCG TABS Take 1,000 mcg daily by mouth.    . diclofenac sodium (VOLTAREN) 1 % GEL Apply 4 g topically 4 (four) times daily. (Patient taking differently: Apply 4 g topically 4 (four) times daily as needed (for pain). ) 300 g 1  . Dulaglutide (TRULICITY) 1.5 JY/7.8GN SOPN Inject 1.5 mg into the skin every Thursday. 12 pen 1  . empagliflozin (JARDIANCE) 25 MG TABS tablet Take 25 mg by mouth daily. 90 tablet 1  . glucose blood (ACCU-CHEK AVIVA PLUS) test strip Check fasting glucose once each morning, and check glucose as needed. 100 each 12  . Lancets (ACCU-CHEK MULTICLIX) lancets Use as instructed 204 each 12  . levothyroxine (SYNTHROID, LEVOTHROID) 50 MCG tablet Take 50 mcg by mouth daily before breakfast.     . loratadine (CLARITIN) 10 MG tablet TAKE 1 TABLET (10 MG TOTAL) BY MOUTH DAILY. 90 tablet 2  . metFORMIN (GLUCOPHAGE) 850 MG tablet Take 1 tablet (850 mg total) by mouth 2 (two) times daily. 180 tablet 1  . olmesartan-hydrochlorothiazide (BENICAR HCT) 40-25 MG tablet Take 1 tablet by mouth daily. 90 tablet 0  . pravastatin (PRAVACHOL) 40 MG tablet TAKE 1 TABLET BY MOUTH EVERY DAY 90 tablet 1  . Travoprost, BAK Free, (TRAVATAN) 0.004 % SOLN ophthalmic solution Place 1 drop into both eyes at bedtime.    . Vitamin D, Ergocalciferol, (DRISDOL) 1.25 MG (50000 UT) CAPS capsule Take weekly 12 capsule 1   No current facility-administered medications for this visit.     Allergies:   Augmentin [amoxicillin-pot clavulanate]    Social History:  The patient  reports that she has never smoked. She has never used smokeless tobacco. She reports that she does not drink alcohol or use drugs.    Family History:  The patient's family history includes Dementia in her mother; Diabetes in her mother; Hypertension in her mother and son.    ROS:  Please see the history of present illness.   Otherwise, review of systems are positive for none.   All other systems are reviewed and negative.    PHYSICAL EXAM: VS:  BP (!) 120/58 (BP Location: Left Arm, Patient Position: Sitting, Cuff Size: Large)   Pulse 87   Ht 5' 3"  (1.6 m)   Wt 219 lb 8 oz (99.6 kg)   BMI 38.88 kg/m  , BMI Body mass index is 38.88 kg/m.  GEN: Well nourished, well developed, in no acute distress  HEENT: normal  Neck: no JVD, carotid bruits, or masses Cardiac: RRR; no  rubs, or gallops. Trace bilateral leg edema. 1 out of 6 systolic murmur in the aortic area Respiratory:  clear to auscultation bilaterally, normal work of breathing GI: soft, nontender, nondistended, + BS MS: no deformity or atrophy  Skin: warm and dry, no rash Neuro:  Strength and sensation are intact Psych: euthymic mood, full affect   EKG:  EKG is ordered today. The ekg ordered today demonstrates normal sinus rhythm with sinus arrhythmia.  Recent Labs: 03/23/2018: TSH 2.686 04/01/2018: Magnesium 1.5 12/28/2018: ALT 7 01/13/2019: BUN 19; Creatinine, Ser 1.11; Hemoglobin 11.8; Platelets 343; Potassium 4.4; Sodium 137    Lipid Panel    Component Value Date/Time   CHOL 140 12/28/2018 0947   CHOL 146 12/19/2016 0951   TRIG 121 12/28/2018 0947   HDL 51 12/28/2018 0947   HDL 50 12/19/2016 0951   CHOLHDL 2.7 12/28/2018 0947   LDLCALC 69 12/28/2018 0947      Wt Readings from Last 3 Encounters:  01/19/19 219 lb 8 oz (99.6 kg)  01/13/19 215 lb (97.5 kg)  12/28/18 218 lb 4.8 oz (99 kg)      No flowsheet data found.    ASSESSMENT AND PLAN:  1.  Paroxysmal atrial fibrillation: She seems to be in sinus rhythm.  It is possible she might be having PVCs.  I reassured her that even if she is having short runs of A. Fib, that is not a situation  that requires emergency room evaluation.  Continue carvedilol 25 mg twice daily and anticoagulation with Eliquis. If palpitations worsen, I will consider outpatient monitor.  2. Essential hypertension: Blood pressure is well controlled on current medications.    3. Hyperlipidemia: Currently on pravastatin with most recent LDL of 61.    Disposition:   FU with me in 6 months  I, Jesus Reyes am acting as a Education administrator for Kathlyn Sacramento, M.D.  I have reviewed the above documentation for accuracy and completeness, and I agree with the above.   Signed, Kathlyn Sacramento, MD 01/19/19 [place], [place]

## 2019-02-02 ENCOUNTER — Ambulatory Visit: Payer: Medicare Other | Admitting: Cardiovascular Disease

## 2019-02-25 DIAGNOSIS — M25512 Pain in left shoulder: Secondary | ICD-10-CM | POA: Insufficient documentation

## 2019-03-25 ENCOUNTER — Encounter: Payer: Self-pay | Admitting: Family Medicine

## 2019-03-25 ENCOUNTER — Ambulatory Visit (INDEPENDENT_AMBULATORY_CARE_PROVIDER_SITE_OTHER): Payer: Medicare Other | Admitting: Family Medicine

## 2019-03-25 VITALS — BP 127/87 | HR 74 | Ht 66.0 in | Wt 212.0 lb

## 2019-03-25 DIAGNOSIS — I480 Paroxysmal atrial fibrillation: Secondary | ICD-10-CM | POA: Diagnosis not present

## 2019-03-25 DIAGNOSIS — M546 Pain in thoracic spine: Secondary | ICD-10-CM | POA: Diagnosis not present

## 2019-03-25 DIAGNOSIS — R140 Abdominal distension (gaseous): Secondary | ICD-10-CM

## 2019-03-25 NOTE — Progress Notes (Signed)
Name: EUPHA LOBB   MRN: [id number]    DOB: [birth year]   Date:03/25/2019       Progress Note  Subjective  Chief Complaint  Chief Complaint  Patient presents with  . Back Pain    Onset-May 8, underneath her rib cage, having gas where she can't eat. The gas has been moving from the left to right side now to the front of her stomach.     I connected with  [name] on 03/25/19 at 10:00 AM EDT by telephone and verified that I am speaking with the correct person using two identifiers.  I discussed the limitations, risks, security and privacy concerns of performing an evaluation and management service by telephone and the availability of in person appointments. Staff also discussed with the patient that there may be a patient responsible charge related to this service. Patient Location: at home Provider Location: [facility]   HPI  Back pain: she states she woke up 6 days ago with sharp pain on her thoracic spine, she states initially was on right side followed by going to left side , she sates last night she had some palpitation, bloating and mild shortness of breath that lasted about 30 minutes. She denies associated nausea, vomiting or diaphoresis. She states normal bowel movement, no blood in stools and denies black stools. She states she had back pain in the past but nothing like this before. Discussed that she may need to go to Urgent Care or [facility] today but she stays she does not have transportation. She agrees on calling 911 if symptoms of palpitation and SOB returns otherwise she will come in tomorrow at 1 pm for face to face encounter  Patient Active Problem List   Diagnosis Date Noted  . Pulmonary hypertension, unspecified ([place]) 12/28/2018  . Paroxysmal A-fib ([place]) 12/28/2018  . Chronic kidney disease, stage III (moderate) ([place]) 04/01/2018  . Elevated parathyroid hormone 01/01/2018  . Elevated uric acid in blood 04/29/2017  . Plantar fasciitis of left foot  03/12/2016  . Hypothyroidism, postablative 01/23/2016  . Aortic stenosis 09/26/2015  . Allergic rhinitis, seasonal 08/08/2015  . Benign hypertension 08/08/2015  . History of pneumonia 08/08/2015  . Carpal tunnel syndrome 08/08/2015  . Cataract 08/08/2015  . Insomnia, persistent 08/08/2015  . Dyslipidemia 08/08/2015  . History of depression 08/08/2015  . Glaucoma 08/08/2015  . Controlled gout 08/08/2015  . Fever blister 08/08/2015  . Bilateral hearing loss 08/08/2015  . Hemorrhoid 08/08/2015  . H/O iron deficiency anemia 08/08/2015  . Benign neoplasm of colon 08/08/2015  . Impingement syndrome of shoulder 08/08/2015  . Osteoporosis, post-menopausal 08/08/2015  . Morbid obesity ([place]) 08/08/2015  . Generalized OA 08/08/2015  . Multinodular goiter 08/08/2015  . Asymptomatic varicose veins 08/08/2015  . Polyp of vocal cord 08/08/2015  . History of vertebral compression fracture 08/08/2015  . Diabetes mellitus with proteinuric diabetic nephropathy ([place]) 08/08/2015  . LVH (left ventricular hypertrophy) 08/08/2015  . Moderate tricuspid regurgitation 08/08/2015  . History of radioactive iodine thyroid ablation 08/08/2015  . Lung nodule, solitary 05/12/2014  . Chronic cough 05/11/2014  . Vitamin D deficiency 12/20/2009  . Plantar fascial fibromatosis 12/20/2009  . Personal history of fall 07/20/2008  . Cervical radiculitis 05/11/2008    Past Surgical History:  Procedure Laterality Date  . ABDOMINAL HYSTERECTOMY  1986  . APPENDECTOMY    . BREAST SURGERY Bilateral 1983   biopsy of each side, negative  . CARPAL TUNNEL RELEASE    . CATARACT  EXTRACTION W/PHACO Left 09/03/2017   Procedure: CATARACT EXTRACTION PHACO AND INTRAOCULAR LENS PLACEMENT (IOC)-LEFT DIABETIC;  Surgeon: Birder Robson, MD;  Location: [facility] ORS;  Service: Ophthalmology;  Laterality: Left;  [place] 01:10.2 AP% 16.7 CDE 11.71 Fluid Pack Lot # [id number]  . CATARACT EXTRACTION W/PHACO Right 09/30/2017   Procedure:  CATARACT EXTRACTION PHACO AND INTRAOCULAR LENS PLACEMENT (IOC);  Surgeon: Birder Robson, MD;  Location: [facility] ORS;  Service: Ophthalmology;  Laterality: Right;  [place] 01:35.1 AP% 13.1 CDE 12.44 Fluid pack Lot # [id number]  . COLON SURGERY    . EYE SURGERY Left 09/03/2017   Cataract extraction  . FEMUR FRACTURE SURGERY Left   . FRACTURE SURGERY  2001   left femur  . HEMORROIDECTOMY    . polyp removed from vocal cord      Family History  Problem Relation Age of Onset  . Diabetes Mother   . Hypertension Mother   . Dementia Mother   . Hypertension Son     Social History   Socioeconomic History  . Marital status: Divorced    Spouse name: Not on file  . Number of children: 6  . Years of education: Not on file  . Highest education level: 10th grade  Occupational History  . Not on file  Social Needs  . Financial resource strain: Somewhat hard  . Food insecurity:    Worry: Never true    Inability: Never true  . Transportation needs:    Medical: No    Non-medical: No  Tobacco Use  . Smoking status: Never Smoker  . Smokeless tobacco: Never Used  Substance and Sexual Activity  . Alcohol use: No    Alcohol/week: 0.0 standard drinks  . Drug use: No  . Sexual activity: Not Currently  Lifestyle  . Physical activity:    Days per week: 7 days    Minutes per session: 30 min  . Stress: Not at all  Relationships  . Social connections:    Talks on phone: More than three times a week    Gets together: Twice a week    Attends religious service: More than 4 times per year    Active member of club or organization: Yes    Attends meetings of clubs or organizations: 1 to 4 times per year    Relationship status: Divorced  . Intimate partner violence:    Fear of current or ex partner: No    Emotionally abused: No    Physically abused: No    Forced sexual activity: No  Other Topics Concern  . Not on file  Social History Narrative  . Not on file     Current Outpatient  Medications:  .  apixaban (ELIQUIS) 5 MG TABS tablet, Take 1 tablet (5 mg total) by mouth 2 (two) times daily., Disp: 180 tablet, Rfl: 1 .  Blood Glucose Monitoring Suppl (ACCU-CHEK AVIVA PLUS) w/Device KIT, 1 kit by Does not apply route 3 (three) times daily., Disp: 1 kit, Rfl: 0 .  carvedilol (COREG) 25 MG tablet, Take 1 tablet (25 mg total) by mouth 2 (two) times daily. New dose, Disp: 180 tablet, Rfl: 3 .  COMBIGAN 0.2-0.5 % ophthalmic solution, Place 1 drop into both eyes 2 (two) times daily., Disp: , Rfl:  .  Cyanocobalamin (B-12) 1000 MCG TABS, Take 1,000 mcg daily by mouth., Disp: , Rfl:  .  diclofenac sodium (VOLTAREN) 1 % GEL, Apply 4 g topically 4 (four) times daily. (Patient taking differently: Apply 4 g topically 4 (four)  times daily as needed (for pain). ), Disp: 300 g, Rfl: 1 .  Dulaglutide (TRULICITY) 1.5 BJ/6.2GB SOPN, Inject 1.5 mg into the skin every Thursday., Disp: 12 pen, Rfl: 1 .  empagliflozin (JARDIANCE) 25 MG TABS tablet, Take 25 mg by mouth daily., Disp: 90 tablet, Rfl: 1 .  Lancets (ACCU-CHEK MULTICLIX) lancets, Use as instructed, Disp: 204 each, Rfl: 12 .  levothyroxine (SYNTHROID, LEVOTHROID) 50 MCG tablet, Take 50 mcg by mouth daily before breakfast. , Disp: , Rfl:  .  loratadine (CLARITIN) 10 MG tablet, TAKE 1 TABLET (10 MG TOTAL) BY MOUTH DAILY., Disp: 90 tablet, Rfl: 2 .  metFORMIN (GLUCOPHAGE) 850 MG tablet, Take 1 tablet (850 mg total) by mouth 2 (two) times daily., Disp: 180 tablet, Rfl: 1 .  olmesartan-hydrochlorothiazide (BENICAR HCT) 40-25 MG tablet, Take 1 tablet by mouth daily., Disp: 90 tablet, Rfl: 0 .  pravastatin (PRAVACHOL) 40 MG tablet, TAKE 1 TABLET BY MOUTH EVERY DAY, Disp: 90 tablet, Rfl: 1 .  Travoprost, BAK Free, (TRAVATAN) 0.004 % SOLN ophthalmic solution, Place 1 drop into both eyes at bedtime., Disp: , Rfl:  .  Vitamin D, Ergocalciferol, (DRISDOL) 1.25 MG (50000 UT) CAPS capsule, Take weekly, Disp: 12 capsule, Rfl: 1 .  glucose blood (ACCU-CHEK  AVIVA PLUS) test strip, Check fasting glucose once each morning, and check glucose as needed., Disp: 100 each, Rfl: 12  Allergies  Allergen Reactions  . Augmentin [Amoxicillin-Pot Clavulanate] Itching    Has patient had a PCN reaction causing immediate rash, facial/tongue/throat swelling, SOB or lightheadedness with hypotension: No Has patient had a PCN reaction causing severe rash involving mucus membranes or skin necrosis: No Has patient had a PCN reaction that required hospitalization: No Has patient had a PCN reaction occurring within the last 10 years: No If all of the above answers are "NO", then may proceed with Cephalosporin use.     I personally reviewed active problem list, medication list, allergies, family history, social history with the patient/caregiver today.   ROS  Ten systems reviewed and is negative except as mentioned in HPI   Objective  Virtual encounter, vitals obtained at home  Body mass index is 34.22 kg/m.  Physical Exam  Awake, alert and oriented  PHQ2/9: Depression screen [facility] 2/9 03/25/2019 12/28/2018 08/03/2018 06/26/2018 08/29/2017  Decreased Interest 0 0 0 0 0  Down, Depressed, Hopeless 0 0 0 0 0  PHQ - 2 Score 0 0 0 0 0  Altered sleeping 0 0 2 - -  Tired, decreased energy 0 0 2 - -  Change in appetite 0 0 0 - -  Feeling bad or failure about yourself  0 0 0 - -  Trouble concentrating 0 0 0 - -  Moving slowly or fidgety/restless 0 0 0 - -  Suicidal thoughts 0 0 0 - -  PHQ-9 Score 0 0 4 - -  Difficult doing work/chores Not difficult at all Not difficult at all Not difficult at all - -   PHQ-2/9 Result is negative.    Fall Risk: Fall Risk  03/25/2019 12/28/2018 08/03/2018 06/26/2018 04/01/2018  Falls in the past year? 0 0 No No No  Number falls in past yr: 0 0 - - -  Injury with Fall? 0 0 - - -  Follow up - Falls evaluation completed - - -    Assessment & Plan  1. Paroxysmal A-fib (HCC)   2. Pain in thoracic spine   3. Bloating symptom   Explained that she has  multiple risk factors, discussed atypical symptoms of heart attack and need to go to [facility], she states she is feeling better now and wants to wait until tomorrow to see me, however she will call [phone] if symptoms of bloating, sob and palpitation returns between now and 1 pm tomorrow  I discussed the assessment and treatment plan with the patient. The patient was provided an opportunity to ask questions and all were answered. The patient agreed with the plan and demonstrated an understanding of the instructions.   The patient was advised to call back or seek an in-person evaluation if the symptoms worsen or if the condition fails to improve as anticipated.  I provided 15  minutes of non-face-to-face time during this encounter.  Loistine Chance, MD

## 2019-03-26 ENCOUNTER — Other Ambulatory Visit
Admission: RE | Admit: 2019-03-26 | Discharge: 2019-03-26 | Disposition: A | Discharge status: Home / Self Care | Payer: Medicare Other | Source: Home / Self Care | Attending: Family Medicine | Admitting: Family Medicine

## 2019-03-26 ENCOUNTER — Encounter: Payer: Self-pay | Admitting: Family Medicine

## 2019-03-26 ENCOUNTER — Ambulatory Visit (INDEPENDENT_AMBULATORY_CARE_PROVIDER_SITE_OTHER): Payer: Medicare Other | Admitting: Family Medicine

## 2019-03-26 ENCOUNTER — Other Ambulatory Visit: Payer: Self-pay

## 2019-03-26 ENCOUNTER — Telehealth: Payer: Self-pay

## 2019-03-26 ENCOUNTER — Other Ambulatory Visit: Payer: Self-pay | Admitting: Family Medicine

## 2019-03-26 ENCOUNTER — Ambulatory Visit
Admission: RE | Admit: 2019-03-26 | Discharge: 2019-03-26 | Disposition: A | Discharge status: Home / Self Care | Payer: Medicare Other | Source: Ambulatory Visit | Attending: Family Medicine | Admitting: Family Medicine

## 2019-03-26 VITALS — BP 126/78 | HR 94 | Temp 98.2°F | Resp 16 | Ht 66.0 in | Wt 217.7 lb

## 2019-03-26 DIAGNOSIS — Z79899 Other long term (current) drug therapy: Secondary | ICD-10-CM

## 2019-03-26 DIAGNOSIS — R140 Abdominal distension (gaseous): Secondary | ICD-10-CM

## 2019-03-26 DIAGNOSIS — R109 Unspecified abdominal pain: Secondary | ICD-10-CM

## 2019-03-26 DIAGNOSIS — R0602 Shortness of breath: Secondary | ICD-10-CM

## 2019-03-26 DIAGNOSIS — R935 Abnormal findings on diagnostic imaging of other abdominal regions, including retroperitoneum: Secondary | ICD-10-CM | POA: Diagnosis not present

## 2019-03-26 DIAGNOSIS — R1012 Left upper quadrant pain: Secondary | ICD-10-CM | POA: Insufficient documentation

## 2019-03-26 DIAGNOSIS — R591 Generalized enlarged lymph nodes: Secondary | ICD-10-CM | POA: Diagnosis not present

## 2019-03-26 DIAGNOSIS — I700 Atherosclerosis of aorta: Secondary | ICD-10-CM

## 2019-03-26 LAB — CBC WITH DIFFERENTIAL/PLATELET
Abs Immature Granulocytes: 0.02 10*3/uL (ref 0.00–0.07)
Basophils Absolute: 0.1 10*3/uL (ref 0.0–0.1)
Basophils Relative: 1 %
Eosinophils Absolute: 0.3 10*3/uL (ref 0.0–0.5)
Eosinophils Relative: 3 %
HCT: 38.9 % (ref 36.0–46.0)
Hemoglobin: 11.5 g/dL — ABNORMAL LOW (ref 12.0–15.0)
Immature Granulocytes: 0 %
Lymphocytes Relative: 38 %
Lymphs Abs: 3.8 10*3/uL (ref 0.7–4.0)
MCH: 26 pg (ref 26.0–34.0)
MCHC: 29.6 g/dL — ABNORMAL LOW (ref 30.0–36.0)
MCV: 88 fL (ref 80.0–100.0)
Monocytes Absolute: 0.7 10*3/uL (ref 0.1–1.0)
Monocytes Relative: 7 %
Neutro Abs: 5.3 10*3/uL (ref 1.7–7.7)
Neutrophils Relative %: 51 %
Platelets: 308 10*3/uL (ref 150–400)
RBC: 4.42 MIL/uL (ref 3.87–5.11)
RDW: 15.8 % — ABNORMAL HIGH (ref 11.5–15.5)
WBC: 10.1 10*3/uL (ref 4.0–10.5)
nRBC: 0 % (ref 0.0–0.2)

## 2019-03-26 LAB — POCT URINALYSIS DIPSTICK
Bilirubin, UA: NEGATIVE
Blood, UA: NEGATIVE
Glucose, UA: POSITIVE — AB
Ketones, UA: NEGATIVE
Nitrite, UA: NEGATIVE
Protein, UA: NEGATIVE
Spec Grav, UA: 1.01 (ref 1.010–1.025)
Urobilinogen, UA: 0.2 E.U./dL
pH, UA: 5 (ref 5.0–8.0)

## 2019-03-26 LAB — COMPREHENSIVE METABOLIC PANEL
ALT: 10 U/L (ref 0–44)
AST: 12 U/L — ABNORMAL LOW (ref 15–41)
Albumin: 3.6 g/dL (ref 3.5–5.0)
Alkaline Phosphatase: 51 U/L (ref 38–126)
Anion gap: 10 (ref 5–15)
BUN: 19 mg/dL (ref 8–23)
CO2: 27 mmol/L (ref 22–32)
Calcium: 8.9 mg/dL (ref 8.9–10.3)
Chloride: 103 mmol/L (ref 98–111)
Creatinine, Ser: 1.34 mg/dL — ABNORMAL HIGH (ref 0.44–1.00)
GFR calc Af Amer: 42 mL/min — ABNORMAL LOW (ref >60)
GFR calc non Af Amer: 36 mL/min — ABNORMAL LOW (ref >60)
Glucose, Bld: 155 mg/dL — ABNORMAL HIGH (ref 70–99)
Potassium: 3.7 mmol/L (ref 3.5–5.1)
Sodium: 140 mmol/L (ref 135–145)
Total Bilirubin: 0.2 mg/dL — ABNORMAL LOW (ref 0.3–1.2)
Total Protein: 6.7 g/dL (ref 6.5–8.1)

## 2019-03-26 LAB — CK TOTAL AND CKMB (NOT AT ARMC)
CK, MB: 1.2 ng/mL (ref 0.5–5.0)
Relative Index: INVALID (ref 0.0–2.5)
Total CK: 60 U/L (ref 38–234)

## 2019-03-26 LAB — AMYLASE: Amylase: 75 U/L (ref 28–100)

## 2019-03-26 LAB — TROPONIN I: Troponin I: <0.03 ng/mL (ref <0.03)

## 2019-03-26 LAB — LIPASE, BLOOD: Lipase: 40 U/L (ref 11–51)

## 2019-03-26 MED ORDER — ONDANSETRON HCL 4 MG PO TABS: 4.0000 mg | ORAL_TABLET | Freq: Three times a day (TID) | ORAL | 0 refills | Status: DC | PRN

## 2019-03-26 MED ORDER — PANTOPRAZOLE SODIUM 40 MG PO TBEC: 40.0000 mg | DELAYED_RELEASE_TABLET | Freq: Every day | ORAL | 0 refills | Status: DC

## 2019-03-26 MED ORDER — TRAMADOL HCL 50 MG PO TABS: 50.0000 mg | ORAL_TABLET | Freq: Three times a day (TID) | ORAL | 0 refills | Status: AC | PRN

## 2019-03-26 NOTE — Progress Notes (Signed)
  Please schedule as a new patient in clinic next week.  M

## 2019-03-26 NOTE — Addendum Note (Signed)
Addended by: Steele Sizer F on: 03/26/2019 04:44 PM   Modules accepted: Orders

## 2019-03-26 NOTE — Progress Notes (Addendum)
Name: Susan Bowen   MRN: [id number]    DOB: Apr 30, 1939   Date:03/26/2019       Progress Note  Subjective  Chief Complaint  Chief Complaint  Patient presents with  . Follow-up  . Flank Pain  . Bloated    HPI  Back pain: she states she woke up 7 days ago with sharp pain on her thoracic spine, she states initially was on right side followed by going to left side , she also had some  bloating and mild shortness of breath that lasted about 30 minutes. She denies associated nausea, vomiting or diaphoresis. She states normal bowel movement, no blood in stools and denies black stools, last one was this am and brown in color. She states she had back pain in the past but nothing like this before.she was seen by Telemedicine yesterday and advised to come in today for follow up. She states she continues to feel bloated, sob has improved , no diaphoresis, able to eat small portions but states it cause more abdominal pain , pain on left mid back and also bloating. No fever or chills. Spoke to her daughter before she left, patient will go to [facility] to have labs done, if possible pancreatitis we will check CT abdomen, if negative for infection we will advised bland diet and urgent follow up with GI. We will also check troponin I and CKMB. I have daughter's cell number so I can call back this afternoon with results.   No history of cholecystectomy    Patient Active Problem List   Diagnosis Date Noted  . Pulmonary hypertension, unspecified ([place]) 12/28/2018  . Paroxysmal A-fib ([place]) 12/28/2018  . Chronic kidney disease, stage III (moderate) ([place]) 04/01/2018  . Elevated parathyroid hormone 01/01/2018  . Elevated uric acid in blood 04/29/2017  . Plantar fasciitis of left foot 03/12/2016  . Hypothyroidism, postablative 01/23/2016  . Aortic stenosis 09/26/2015  . Allergic rhinitis, seasonal 08/08/2015  . Benign hypertension 08/08/2015  . History of pneumonia 08/08/2015  . Carpal tunnel syndrome 08/08/2015   . Cataract 08/08/2015  . Insomnia, persistent 08/08/2015  . Dyslipidemia 08/08/2015  . History of depression 08/08/2015  . Glaucoma 08/08/2015  . Controlled gout 08/08/2015  . Fever blister 08/08/2015  . Bilateral hearing loss 08/08/2015  . Hemorrhoid 08/08/2015  . H/O iron deficiency anemia 08/08/2015  . Benign neoplasm of colon 08/08/2015  . Impingement syndrome of shoulder 08/08/2015  . Osteoporosis, post-menopausal 08/08/2015  . Morbid obesity ([place]) 08/08/2015  . Generalized OA 08/08/2015  . Multinodular goiter 08/08/2015  . Asymptomatic varicose veins 08/08/2015  . Polyp of vocal cord 08/08/2015  . History of vertebral compression fracture 08/08/2015  . Diabetes mellitus with proteinuric diabetic nephropathy ([place]) 08/08/2015  . LVH (left ventricular hypertrophy) 08/08/2015  . Moderate tricuspid regurgitation 08/08/2015  . History of radioactive iodine thyroid ablation 08/08/2015  . Lung nodule, solitary 05/12/2014  . Chronic cough 05/11/2014  . Vitamin D deficiency 12/20/2009  . Plantar fascial fibromatosis 12/20/2009  . Personal history of fall 07/20/2008  . Cervical radiculitis 05/11/2008    Past Surgical History:  Procedure Laterality Date  . ABDOMINAL HYSTERECTOMY  1986  . APPENDECTOMY    . BREAST SURGERY Bilateral 1983   biopsy of each side, negative  . CARPAL TUNNEL RELEASE    . CATARACT EXTRACTION W/PHACO Left 09/03/2017   Procedure: CATARACT EXTRACTION PHACO AND INTRAOCULAR LENS PLACEMENT (IOC)-LEFT DIABETIC;  Surgeon: Birder Robson, MD;  Location: [facility] ORS;  Service: Ophthalmology;  Laterality: Left;  [place] 01:10.2 AP% 16.7 CDE 11.71 Fluid Pack Lot # [id number]  . CATARACT EXTRACTION W/PHACO Right 09/30/2017   Procedure: CATARACT EXTRACTION PHACO AND INTRAOCULAR LENS PLACEMENT (IOC);  Surgeon: Birder Robson, MD;  Location: [facility] ORS;  Service: Ophthalmology;  Laterality: Right;  [place] 01:35.1 AP% 13.1 CDE 12.44 Fluid pack Lot # [id number]  . COLON SURGERY     . EYE SURGERY Left 09/03/2017   Cataract extraction  . FEMUR FRACTURE SURGERY Left   . FRACTURE SURGERY  2001   left femur  . HEMORROIDECTOMY    . polyp removed from vocal cord      Family History  Problem Relation Age of Onset  . Diabetes Mother   . Hypertension Mother   . Dementia Mother   . Hypertension Son     Social History   Socioeconomic History  . Marital status: Divorced    Spouse name: Not on file  . Number of children: 6  . Years of education: Not on file  . Highest education level: 10th grade  Occupational History  . Not on file  Social Needs  . Financial resource strain: Somewhat hard  . Food insecurity:    Worry: Never true    Inability: Never true  . Transportation needs:    Medical: No    Non-medical: No  Tobacco Use  . Smoking status: Never Smoker  . Smokeless tobacco: Never Used  Substance and Sexual Activity  . Alcohol use: No    Alcohol/week: 0.0 standard drinks  . Drug use: No  . Sexual activity: Not Currently  Lifestyle  . Physical activity:    Days per week: 7 days    Minutes per session: 30 min  . Stress: Not at all  Relationships  . Social connections:    Talks on phone: More than three times a week    Gets together: Twice a week    Attends religious service: More than 4 times per year    Active member of club or organization: Yes    Attends meetings of clubs or organizations: 1 to 4 times per year    Relationship status: Divorced  . Intimate partner violence:    Fear of current or ex partner: No    Emotionally abused: No    Physically abused: No    Forced sexual activity: No  Other Topics Concern  . Not on file  Social History Narrative  . Not on file     Current Outpatient Medications:  .  apixaban (ELIQUIS) 5 MG TABS tablet, Take 1 tablet (5 mg total) by mouth 2 (two) times daily., Disp: 180 tablet, Rfl: 1 .  Blood Glucose Monitoring Suppl (ACCU-CHEK AVIVA PLUS) w/Device KIT, 1 kit by Does not apply route 3 (three)  times daily., Disp: 1 kit, Rfl: 0 .  carvedilol (COREG) 25 MG tablet, Take 1 tablet (25 mg total) by mouth 2 (two) times daily. New dose, Disp: 180 tablet, Rfl: 3 .  COMBIGAN 0.2-0.5 % ophthalmic solution, Place 1 drop into both eyes 2 (two) times daily., Disp: , Rfl:  .  Cyanocobalamin (B-12) 1000 MCG TABS, Take 1,000 mcg daily by mouth., Disp: , Rfl:  .  diclofenac sodium (VOLTAREN) 1 % GEL, Apply 4 g topically 4 (four) times daily. (Patient taking differently: Apply 4 g topically 4 (four) times daily as needed (for pain). ), Disp: 300 g, Rfl: 1 .  Dulaglutide (TRULICITY) 1.5 YS/0.6TK SOPN, Inject 1.5 mg into the skin every Thursday., Disp: 12 pen, Rfl: 1 .  empagliflozin (JARDIANCE) 25 MG TABS tablet, Take 25 mg by mouth daily., Disp: 90 tablet, Rfl: 1 .  glucose blood (ACCU-CHEK AVIVA PLUS) test strip, Check fasting glucose once each morning, and check glucose as needed., Disp: 100 each, Rfl: 12 .  Lancets (ACCU-CHEK MULTICLIX) lancets, Use as instructed, Disp: 204 each, Rfl: 12 .  levothyroxine (SYNTHROID, LEVOTHROID) 50 MCG tablet, Take 50 mcg by mouth daily before breakfast. , Disp: , Rfl:  .  loratadine (CLARITIN) 10 MG tablet, TAKE 1 TABLET (10 MG TOTAL) BY MOUTH DAILY., Disp: 90 tablet, Rfl: 2 .  metFORMIN (GLUCOPHAGE) 850 MG tablet, Take 1 tablet (850 mg total) by mouth 2 (two) times daily., Disp: 180 tablet, Rfl: 1 .  olmesartan-hydrochlorothiazide (BENICAR HCT) 40-25 MG tablet, Take 1 tablet by mouth daily., Disp: 90 tablet, Rfl: 0 .  pravastatin (PRAVACHOL) 40 MG tablet, TAKE 1 TABLET BY MOUTH EVERY DAY, Disp: 90 tablet, Rfl: 1 .  Travoprost, BAK Free, (TRAVATAN) 0.004 % SOLN ophthalmic solution, Place 1 drop into both eyes at bedtime., Disp: , Rfl:  .  Vitamin D, Ergocalciferol, (DRISDOL) 1.25 MG (50000 UT) CAPS capsule, Take weekly, Disp: 12 capsule, Rfl: 1  Allergies  Allergen Reactions  . Augmentin [Amoxicillin-Pot Clavulanate] Itching    Has patient had a PCN reaction causing  immediate rash, facial/tongue/throat swelling, SOB or lightheadedness with hypotension: No Has patient had a PCN reaction causing severe rash involving mucus membranes or skin necrosis: No Has patient had a PCN reaction that required hospitalization: No Has patient had a PCN reaction occurring within the last 10 years: No If all of the above answers are "NO", then may proceed with Cephalosporin use.     I personally reviewed active problem list, medication list, allergies, family history, social history with the patient/caregiver today.   ROS  Ten systems reviewed and is negative except as mentioned in HPI   Objective  Vitals:   03/26/19 1246  BP: 126/78  Pulse: 94  Resp: 16  Temp: 98.2 F (36.8 C)  TempSrc: Oral  SpO2: 98%  Weight: 217 lb 11.2 oz (98.7 kg)  Height: 5' 6"  (1.676 m)    Body mass index is 35.14 kg/m.  Physical Exam  Constitutional: Patient appears well-developed and well-nourished. Obese  No distress.  HEENT: head atraumatic, normocephalic, pupils equal and reactive to light,  neck supple, throat within normal limits Cardiovascular: Normal rate, regular rhythm and normal heart sounds.  No murmur heard. No BLE edema. Pulmonary/Chest: Effort normal and breath sounds normal. No respiratory distress. Abdominal: Soft.  There is left upper quadrant pan, mild pain on epigastric area, also left CVA tenderness, normal bowel sounds  Psychiatric: Patient has a normal mood and affect. behavior is normal. Judgment and thought content normal.  Recent Results (from the past 2160 hour(s))  COMPLETE METABOLIC PANEL WITH GFR     Status: Abnormal   Collection Time: 12/28/18  9:47 AM  Result Value Ref Range   Glucose, Bld 136 (H) 65 - 99 mg/dL    Comment: .            Fasting reference interval . For someone without known diabetes, a glucose value >125 mg/dL indicates that they may have diabetes and this should be confirmed with a follow-up test. .    BUN 18 7 - 25  mg/dL   Creat 1.09 (H) 0.60 - 0.88 mg/dL    Comment: For patients >72 years of age, the reference limit for Creatinine is approximately 13% higher for  people identified as African-American. .    GFR, Est Non African American 46 (L) > OR = 60 mL/min/1.80m   GFR, Est African American 54 (L) > OR = 60 mL/min/1.734m  BUN/Creatinine Ratio 17 6 - 22 (calc)   Sodium 139 135 - 146 mmol/L   Potassium 4.3 3.5 - 5.3 mmol/L   Chloride 103 98 - 110 mmol/L   CO2 28 20 - 32 mmol/L   Calcium 9.6 8.6 - 10.4 mg/dL   Total Protein 6.6 6.1 - 8.1 g/dL   Albumin 3.7 3.6 - 5.1 g/dL   Globulin 2.9 1.9 - 3.7 g/dL (calc)   AG Ratio 1.3 1.0 - 2.5 (calc)   Total Bilirubin 0.3 0.2 - 1.2 mg/dL   Alkaline phosphatase (APISO) 55 37 - 153 U/L   AST 10 10 - 35 U/L   ALT 7 6 - 29 U/L  Lipid panel     Status: None   Collection Time: 12/28/18  9:47 AM  Result Value Ref Range   Cholesterol 140 <200 mg/dL   HDL 51 > OR = 50 mg/dL   Triglycerides 121 <150 mg/dL   LDL Cholesterol (Calc) 69 mg/dL (calc)    Comment: Reference range: <100 . Desirable range <100 mg/dL for primary prevention;   <70 mg/dL for patients with CHD or diabetic patients  with > or = 2 CHD risk factors. . Marland KitchenDL-C is now calculated using the Martin-Hopkins  calculation, which is a validated novel method providing  better accuracy than the Friedewald equation in the  estimation of LDL-C.  MaCresenciano Genret al. JAAnnamaria [id number];[id number] [phone]  (http://education.[employer].com/faq/FAQ164)    Total CHOL/HDL Ratio 2.7 <5.0 (calc)   Non-HDL Cholesterol (Calc) 89 <130 mg/dL (calc)    Comment: For patients with diabetes plus 1 major ASCVD risk  factor, treating to a non-HDL-C goal of <100 mg/dL  (LDL-C of <70 mg/dL) is considered a therapeutic  option.   Hemoglobin A1c     Status: Abnormal   Collection Time: 12/28/18  9:47 AM  Result Value Ref Range   Hgb A1c MFr Bld 8.5 (H) <5.7 % of total Hgb    Comment: For someone without known diabetes,  a hemoglobin A1c value of 6.5% or greater indicates that they may have  diabetes and this should be confirmed with a follow-up  test. . For someone with known diabetes, a value <7% indicates  that their diabetes is well controlled and a value  greater than or equal to 7% indicates suboptimal  control. A1c targets should be individualized based on  duration of diabetes, age, comorbid conditions, and  other considerations. . Currently, no consensus exists regarding use of hemoglobin A1c for diagnosis of diabetes for children. .    Mean Plasma Glucose 197 (calc)   eAG (mmol/L) 10.9 (calc)  Microalbumin / creatinine urine ratio     Status: None   Collection Time: 12/28/18  9:47 AM  Result Value Ref Range   Creatinine, Urine 64 20 - 275 mg/dL   Microalb, Ur 0.3 mg/dL    Comment: Reference Range Not established    Microalb Creat Ratio 5 <30 mcg/mg creat    Comment: . The ADA defines abnormalities in albumin excretion as follows: . Marland Kitchenategory         Result (mcg/mg creatinine) . Normal                    <30 Microalbuminuria         30-299  Clinical  albuminuria   > OR = 300 . The ADA recommends that at least two of three specimens collected within a 3-6 month period be abnormal before considering a patient to be within a diagnostic category.   Basic metabolic panel     Status: Abnormal   Collection Time: 01/13/19 12:43 AM  Result Value Ref Range   Sodium 137 135 - 145 mmol/L   Potassium 4.4 3.5 - 5.1 mmol/L   Chloride 100 98 - 111 mmol/L   CO2 21 (L) 22 - 32 mmol/L   Glucose, Bld 222 (H) 70 - 99 mg/dL   BUN 19 8 - 23 mg/dL   Creatinine, Ser 1.11 (H) 0.44 - 1.00 mg/dL   Calcium 10.0 8.9 - 10.3 mg/dL   GFR calc non Af Amer 45 (L) >60 mL/min   GFR calc Af Amer 52 (L) >60 mL/min   Anion gap 16 (H) 5 - 15    Comment: Performed at [facility], [address] [place], [place] [address]  CBC     Status: Abnormal   Collection Time: 01/13/19 12:43 AM  Result Value  Ref Range   WBC 10.1 4.0 - 10.5 K/uL   RBC 4.56 3.87 - 5.11 MIL/uL   Hemoglobin 11.8 (L) 12.0 - 15.0 g/dL   HCT 38.3 36.0 - 46.0 %   MCV 84.0 80.0 - 100.0 fL   MCH 25.9 (L) 26.0 - 34.0 pg   MCHC 30.8 30.0 - 36.0 g/dL   RDW 16.5 (H) 11.5 - 15.5 %   Platelets 343 150 - 400 K/uL   nRBC 0.0 0.0 - 0.2 %    Comment: Performed at [facility], [address] [place], [place] [address]  Troponin I - Once     Status: None   Collection Time: 01/13/19 12:43 AM  Result Value Ref Range   Troponin I <0.03 <0.03 ng/mL    Comment: Performed at [facility], [address] [place], [place] [address]  Troponin I - ONCE - STAT     Status: None   Collection Time: 01/13/19  4:12 AM  Result Value Ref Range   Troponin I <0.03 <0.03 ng/mL    Comment: Performed at [facility], [address] [place], [place] [address]      PHQ2/9: Depression screen [facility] 2/9 03/25/2019 12/28/2018 08/03/2018 06/26/2018 08/29/2017  Decreased Interest 0 0 0 0 0  Down, Depressed, Hopeless 0 0 0 0 0  PHQ - 2 Score 0 0 0 0 0  Altered sleeping 0 0 2 - -  Tired, decreased energy 0 0 2 - -  Change in appetite 0 0 0 - -  Feeling bad or failure about yourself  0 0 0 - -  Trouble concentrating 0 0 0 - -  Moving slowly or fidgety/restless 0 0 0 - -  Suicidal thoughts 0 0 0 - -  PHQ-9 Score 0 0 4 - -  Difficult doing work/chores Not difficult at all Not difficult at all Not difficult at all - -    phq 9 is negative   Fall Risk: Fall Risk  03/25/2019 12/28/2018 08/03/2018 06/26/2018 04/01/2018  Falls in the past year? 0 0 No No No  Number falls in past yr: 0 0 - - -  Injury with Fall? 0 0 - - -  Follow up - Falls evaluation completed - - -     Assessment & Plan  1. Bloating  Last meal was around 11 am -  Lipase - CBC with Differential/Platelet - Comprehensive metabolic panel - Amylase  2. Left upper quadrant pain  - Lipase - CBC with Differential/Platelet - Comprehensive metabolic  panel - Amylase  Added tramadol for pain   3. High risk medication use  She is on Trulicity and is a risk factor for pancreatitis   4. Left flank pain  - Urinalysis - CULTURE, URINE COMPREHENSIVE   Patient returned to discuss CT results and labs , explained incidental finding on CT scan of lymphonodi, we will treat for gastritis and refer her to hematologist and also gastroenterologist   5. SOB (shortness of breath)  - Troponin I; Future - CK Total (and CKMB); Future  6. Lymphadenopathy  - Ambulatory referral to Hematology  7. Atherosclerosis of aorta ([place])   8. Abnormal computed tomography angiography (CTA) of abdomen and pelvis  - Ambulatory referral to Hematology - Ambulatory referral to Gastroenterology

## 2019-03-26 NOTE — Telephone Encounter (Signed)
Rec'd call report from [facility] at [facility] with results of CT Abdomen and Pelvis/ Renal Stone study; final results are in Epic, and pt. Is still at the imaging center, waiting for okay to go home.    Called report to Dr. Brita Romp, on call physician, for [facility].  Stated she will contact the pt. to further discuss.  Advised of [place]; [phone].   Rec'd call from Dr. Brita Romp.  She was not able to reach the pt. at the Imaging center, as it had closed.  She stated she left a message on pt's phone, to call back to office, and that Dr. B. could be paged.

## 2019-03-26 NOTE — Patient Instructions (Signed)
Gastroesophageal Reflux Disease, Adult Gastroesophageal reflux (GER) happens when acid from the stomach flows up into the tube that connects the mouth and the stomach (esophagus). Normally, food travels down the esophagus and stays in the stomach to be digested. However, when a person has GER, food and stomach acid sometimes move back up into the esophagus. If this becomes a more serious problem, the person may be diagnosed with a disease called gastroesophageal reflux disease (GERD). GERD occurs when the reflux:  Happens often.  Causes frequent or severe symptoms.  Causes problems such as damage to the esophagus. When stomach acid comes in contact with the esophagus, the acid may cause soreness (inflammation) in the esophagus. Over time, GERD may create small holes (ulcers) in the lining of the esophagus. What are the causes? This condition is caused by a problem with the muscle between the esophagus and the stomach (lower esophageal sphincter, or LES). Normally, the LES muscle closes after food passes through the esophagus to the stomach. When the LES is weakened or abnormal, it does not close properly, and that allows food and stomach acid to go back up into the esophagus. The LES can be weakened by certain dietary substances, medicines, and medical conditions, including:  Tobacco use.  Pregnancy.  Having a hiatal hernia.  Alcohol use.  Certain foods and beverages, such as coffee, chocolate, onions, and peppermint. What increases the risk? You are more likely to develop this condition if you:  Have an increased body weight.  Have a connective tissue disorder.  Use NSAID medicines. What are the signs or symptoms? Symptoms of this condition include:  Heartburn.  Difficult or painful swallowing.  The feeling of having a lump in the throat.  Abitter taste in the mouth.  Bad breath.  Having a large amount of saliva.  Having an upset or bloated  stomach.  Belching.  Chest pain. Different conditions can cause chest pain. Make sure you see your health care provider if you experience chest pain.  Shortness of breath or wheezing.  Ongoing (chronic) cough or a night-time cough.  Wearing away of tooth enamel.  Weight loss. How is this diagnosed? Your health care provider will take a medical history and perform a physical exam. To determine if you have mild or severe GERD, your health care provider may also monitor how you respond to treatment. You may also have tests, including:  A test to examine your stomach and esophagus with a small camera (endoscopy).  A test thatmeasures the acidity level in your esophagus.  A test thatmeasures how much pressure is on your esophagus.  A barium swallow or modified barium swallow test to show the shape, size, and functioning of your esophagus. How is this treated? The goal of treatment is to help relieve your symptoms and to prevent complications. Treatment for this condition may vary depending on how severe your symptoms are. Your health care provider may recommend:  Changes to your diet.  Medicine.  Surgery. Follow these instructions at home: Eating and drinking   Follow a diet as recommended by your health care provider. This may involve avoiding foods and drinks such as: ? Coffee and tea (with or without caffeine). ? Drinks that containalcohol. ? Energy drinks and sports drinks. ? Carbonated drinks or sodas. ? Chocolate and cocoa. ? Peppermint and mint flavorings. ? Garlic and onions. ? Horseradish. ? Spicy and acidic foods, including peppers, chili powder, curry powder, vinegar, hot sauces, and barbecue sauce. ? Citrus fruit juices and citrus   fruits, such as oranges, lemons, and limes. ? Tomato-based foods, such as red sauce, chili, salsa, and pizza with red sauce. ? Fried and fatty foods, such as donuts, french fries, potato chips, and high-fat dressings. ? High-fat  meats, such as hot dogs and fatty cuts of red and white meats, such as rib eye steak, sausage, ham, and bacon. ? High-fat dairy items, such as whole milk, butter, and cream cheese.  Eat small, frequent meals instead of large meals.  Avoid drinking large amounts of liquid with your meals.  Avoid eating meals during the 2-3 hours before bedtime.  Avoid lying down right after you eat.  Do not exercise right after you eat. Lifestyle   Do not use any products that contain nicotine or tobacco, such as cigarettes, e-cigarettes, and chewing tobacco. If you need help quitting, ask your health care provider.  Try to reduce your stress by using methods such as yoga or meditation. If you need help reducing stress, ask your health care provider.  If you are overweight, reduce your weight to an amount that is healthy for you. Ask your health care provider for guidance about a safe weight loss goal. General instructions  Pay attention to any changes in your symptoms.  Take over-the-counter and prescription medicines only as told by your health care provider. Do not take aspirin, ibuprofen, or other NSAIDs unless your health care provider told you to do so.  Wear loose-fitting clothing. Do not wear anything tight around your waist that causes pressure on your abdomen.  Raise (elevate) the head of your bed about 6 inches (15 cm).  Avoid bending over if this makes your symptoms worse.  Keep all follow-up visits as told by your health care provider. This is important. Contact a health care provider if:  You have: ? New symptoms. ? Unexplained weight loss. ? Difficulty swallowing or it hurts to swallow. ? Wheezing or a persistent cough. ? A hoarse voice.  Your symptoms do not improve with treatment. Get help right away if you:  Have pain in your arms, neck, jaw, teeth, or back.  Feel sweaty, dizzy, or light-headed.  Have chest pain or shortness of breath.  Vomit and your vomit looks  like blood or coffee grounds.  Faint.  Have stool that is bloody or black.  Cannot swallow, drink, or eat. Summary  Gastroesophageal reflux happens when acid from the stomach flows up into the esophagus. GERD is a disease in which the reflux happens often, causes frequent or severe symptoms, or causes problems such as damage to the esophagus.  Treatment for this condition may vary depending on how severe your symptoms are. Your health care provider may recommend diet and lifestyle changes, medicine, or surgery.  Contact a health care provider if you have new or worsening symptoms.  Take over-the-counter and prescription medicines only as told by your health care provider. Do not take aspirin, ibuprofen, or other NSAIDs unless your health care provider told you to do so.  Keep all follow-up visits as told by your health care provider. This is important. This information is not intended to replace advice given to you by your health care provider. Make sure you discuss any questions you have with your health care provider. Document Released: 07/31/2005 Document Revised: 04/29/2018 Document Reviewed: 04/29/2018 Elsevier Interactive Patient Education  2019 [employer] Inc.  

## 2019-03-28 LAB — CULTURE, URINE COMPREHENSIVE
MICRO NUMBER:: 501252
SPECIMEN QUALITY:: ADEQUATE

## 2019-03-30 ENCOUNTER — Encounter: Payer: Self-pay | Admitting: Oncology

## 2019-03-30 ENCOUNTER — Other Ambulatory Visit: Payer: Self-pay | Admitting: Family Medicine

## 2019-03-30 ENCOUNTER — Inpatient Hospital Stay: Payer: Medicare Other | Attending: Oncology | Admitting: Oncology

## 2019-03-30 ENCOUNTER — Other Ambulatory Visit: Payer: Self-pay

## 2019-03-30 VITALS — BP 153/89 | HR 84 | Temp 98.7°F | Resp 18 | Ht 63.0 in | Wt 215.5 lb

## 2019-03-30 DIAGNOSIS — R590 Localized enlarged lymph nodes: Secondary | ICD-10-CM

## 2019-03-30 DIAGNOSIS — Z79899 Other long term (current) drug therapy: Secondary | ICD-10-CM | POA: Diagnosis not present

## 2019-03-30 DIAGNOSIS — I517 Cardiomegaly: Secondary | ICD-10-CM | POA: Insufficient documentation

## 2019-03-30 DIAGNOSIS — M109 Gout, unspecified: Secondary | ICD-10-CM | POA: Diagnosis not present

## 2019-03-30 DIAGNOSIS — I700 Atherosclerosis of aorta: Secondary | ICD-10-CM | POA: Insufficient documentation

## 2019-03-30 DIAGNOSIS — Z7901 Long term (current) use of anticoagulants: Secondary | ICD-10-CM | POA: Insufficient documentation

## 2019-03-30 DIAGNOSIS — E890 Postprocedural hypothyroidism: Secondary | ICD-10-CM | POA: Diagnosis not present

## 2019-03-30 DIAGNOSIS — I480 Paroxysmal atrial fibrillation: Secondary | ICD-10-CM | POA: Diagnosis not present

## 2019-03-30 DIAGNOSIS — E559 Vitamin D deficiency, unspecified: Secondary | ICD-10-CM | POA: Insufficient documentation

## 2019-03-30 DIAGNOSIS — N183 Chronic kidney disease, stage 3 (moderate): Secondary | ICD-10-CM | POA: Diagnosis not present

## 2019-03-30 DIAGNOSIS — R591 Generalized enlarged lymph nodes: Secondary | ICD-10-CM

## 2019-03-30 DIAGNOSIS — Z791 Long term (current) use of non-steroidal anti-inflammatories (NSAID): Secondary | ICD-10-CM | POA: Diagnosis not present

## 2019-03-30 DIAGNOSIS — I272 Pulmonary hypertension, unspecified: Secondary | ICD-10-CM | POA: Diagnosis not present

## 2019-03-30 DIAGNOSIS — R911 Solitary pulmonary nodule: Secondary | ICD-10-CM | POA: Insufficient documentation

## 2019-03-30 DIAGNOSIS — F329 Major depressive disorder, single episode, unspecified: Secondary | ICD-10-CM | POA: Diagnosis not present

## 2019-03-30 DIAGNOSIS — R109 Unspecified abdominal pain: Secondary | ICD-10-CM | POA: Diagnosis not present

## 2019-03-30 DIAGNOSIS — E785 Hyperlipidemia, unspecified: Secondary | ICD-10-CM | POA: Diagnosis not present

## 2019-03-30 DIAGNOSIS — R9389 Abnormal findings on diagnostic imaging of other specified body structures: Secondary | ICD-10-CM

## 2019-03-30 DIAGNOSIS — E1122 Type 2 diabetes mellitus with diabetic chronic kidney disease: Secondary | ICD-10-CM | POA: Diagnosis not present

## 2019-03-30 DIAGNOSIS — G4700 Insomnia, unspecified: Secondary | ICD-10-CM | POA: Diagnosis not present

## 2019-03-30 DIAGNOSIS — R599 Enlarged lymph nodes, unspecified: Secondary | ICD-10-CM

## 2019-03-30 DIAGNOSIS — I129 Hypertensive chronic kidney disease with stage 1 through stage 4 chronic kidney disease, or unspecified chronic kidney disease: Secondary | ICD-10-CM | POA: Diagnosis not present

## 2019-03-30 DIAGNOSIS — R7989 Other specified abnormal findings of blood chemistry: Secondary | ICD-10-CM | POA: Diagnosis not present

## 2019-03-30 MED ORDER — NITROFURANTOIN MONOHYD MACRO 100 MG PO CAPS: 100.0000 mg | ORAL_CAPSULE | Freq: Two times a day (BID) | ORAL | 0 refills | Status: DC

## 2019-03-30 NOTE — Progress Notes (Signed)
Pt states that when she eats her stomach becomes bloated. She got nauseated last week and now she Is on bland diet to keep from have bloating and gas. Her appetite is not as good since last week. She was told that she has lymph nodes in her abd. on scan

## 2019-03-31 ENCOUNTER — Encounter: Payer: Self-pay | Admitting: Oncology

## 2019-03-31 ENCOUNTER — Telehealth: Payer: Self-pay

## 2019-03-31 NOTE — Telephone Encounter (Signed)
Copied from [place] [id number]. Topic: General - Inquiry >> Mar 31, 2019 10:27 AM Sheran Luz wrote: Reason for CRM: Melody, with [facility] requesting call back from Dr. Ancil Boozer CMA as referral was sent urgent for patient to see Dr Vicente Males, however patient is able to see another provider tomorrow (Dr. Altoona Desanctis) upon approval. Melody states that their offices closes at 12pm today.   I spoke with Dr. Ancil Boozer and she has given permission to switch to an available provider.

## 2019-03-31 NOTE — Progress Notes (Signed)
Hematology/Oncology Consult note [facility] [phone] [phone]  Patient Care Team: Steele Sizer, MD as PCP - General (Family Medicine) Wellington Hampshire, MD as PCP - Cardiology (Cardiology)   Name of the patient: Susan Bowen [id number]  1932-08-20    Reason for referral-intra-abdominal adenopathy   Referring physician-Dr. Ancil Boozer  Date of visit: 03/31/19   History of presenting illness-                          Patient is a 85 year old female who was seen by Dr. Ancil Boozer for symptoms of Right-sided flank pain which has been ongoing for the last 1 week.  She had a CT renal stone study which showed pathologically enlarged lymph nodes in the right lower quadrant up to 2.6 cm.  Findings concerning for lymphoma or a malignant process.  No other findings of malignancy.  No hepatosplenomegaly.patient's past medical history is significant for hypothyroidism, pulmonary hypertension and CKD among other medical problems.  She currently reports that her flank pain has resolved.  Reports that her appetite is good and she denies any unintentional weight loss or drenching night sweats.  She lives with her son and is independent of her ADLs and IADLs.  She has 5 adult children.Denies any blood in her stool or urine.  Patient reports that her last colonoscopy was in 2008.  At that time she was noted to have a polyp.  She did not have any subsequent colonoscopies.   ECOG PS- 1  Pain scale- 0   Review of systems- Review of Systems  Constitutional: Positive for malaise/fatigue. Negative for chills, fever and weight loss.  HENT: Negative for congestion, ear discharge and nosebleeds.   Eyes: Negative for blurred vision.  Respiratory: Negative for cough, hemoptysis, sputum production, shortness of breath and wheezing.   Cardiovascular: Negative for chest pain, palpitations, orthopnea and claudication.  Gastrointestinal: Negative for abdominal pain, blood in  stool, constipation, diarrhea, heartburn, melena, nausea and vomiting.  Genitourinary: Negative for dysuria, flank pain, frequency, hematuria and urgency.  Musculoskeletal: Negative for back pain, joint pain and myalgias.  Skin: Negative for rash.  Neurological: Negative for dizziness, tingling, focal weakness, seizures, weakness and headaches.  Endo/Heme/Allergies: Does not bruise/bleed easily.  Psychiatric/Behavioral: Negative for depression and suicidal ideas. The patient does not have insomnia.     Allergies  Allergen Reactions   Augmentin [Amoxicillin-Pot Clavulanate] Itching    Has patient had a PCN reaction causing immediate rash, facial/tongue/throat swelling, SOB or lightheadedness with hypotension: No Has patient had a PCN reaction causing severe rash involving mucus membranes or skin necrosis: No Has patient had a PCN reaction that required hospitalization: No Has patient had a PCN reaction occurring within the last 10 years: No If all of the above answers are "NO", then may proceed with Cephalosporin use.     Patient Active Problem List   Diagnosis Date Noted   Pulmonary hypertension, unspecified ([place]) 12/28/2018   Paroxysmal A-fib ([place]) 12/28/2018   Chronic kidney disease, stage III (moderate) ([place]) 04/01/2018   Elevated parathyroid hormone 01/01/2018   Elevated uric acid in blood 04/29/2017   Plantar fasciitis of left foot 03/12/2016   Hypothyroidism, postablative 01/23/2016   Aortic stenosis 09/26/2015   Allergic rhinitis, seasonal 08/08/2015   Benign hypertension 08/08/2015   History of pneumonia 08/08/2015   Carpal tunnel syndrome 08/08/2015   Cataract 08/08/2015   Insomnia, persistent 08/08/2015   Dyslipidemia 08/08/2015   History of depression 08/08/2015  Glaucoma 08/08/2015   Controlled gout 08/08/2015   Fever blister 08/08/2015   Bilateral hearing loss 08/08/2015   Hemorrhoid 08/08/2015   H/O iron deficiency anemia 08/08/2015    Benign neoplasm of colon 08/08/2015   Impingement syndrome of shoulder 08/08/2015   Osteoporosis, post-menopausal 08/08/2015   Morbid obesity ([place]) 08/08/2015   Generalized OA 08/08/2015   Multinodular goiter 08/08/2015   Asymptomatic varicose veins 08/08/2015   Polyp of vocal cord 08/08/2015   History of vertebral compression fracture 08/08/2015   Diabetes mellitus with proteinuric diabetic nephropathy ([place]) 08/08/2015   LVH (left ventricular hypertrophy) 08/08/2015   Moderate tricuspid regurgitation 08/08/2015   History of radioactive iodine thyroid ablation 08/08/2015   Lung nodule, solitary 05/12/2014   Chronic cough 05/11/2014   Vitamin D deficiency 12/20/2009   Plantar fascial fibromatosis 12/20/2009   Personal history of fall 07/20/2008   Cervical radiculitis 05/11/2008     Past Medical History:  Diagnosis Date   Allergy    Back spasm    Bunion    Carpal tunnel syndrome    Cataracta    Diabetes mellitus without complication ([place])    Generalized osteoarthritis    Gout    Hemorrhoids without complication    Hyperlipidemia    Hypertension    Hypothyroidism    Impingement syndrome of right shoulder    Insomnia    Lipoma    Lung nodule    Neuritis or radiculitis due to rupture of lumbar intervertebral disc    Obesity    Osteoporosis    PAF (paroxysmal atrial fibrillation) ([place])    a. 03/2018 AF w/ RVR-->converted spont.  CHA2DS2VASc = 7-->Eliquis.   Proteinuria    Rotator cuff tear    Systolic murmur    a. 07/5092 Echo: EF 60-65%, no rwma, Gr1 DD, mild MR, mildly dil LA. Nl RV fxn. PASP 69mHg.   TIA (transient ischemic attack)    TIA (transient ischemic attack) 1998   small TIA.  no residual effects   Tinnitus of both ears    Unspecified glaucoma(365.9)    Vitamin D deficiency    Wedge compression fracture of t11-T12 vertebra, sequela      Past Surgical History:  Procedure Laterality Date   ABDOMINAL  HYSTERECTOMY  1986   APPENDECTOMY     BREAST SURGERY Bilateral 1983   biopsy of each side, negative   CARPAL TUNNEL RELEASE     CATARACT EXTRACTION W/PHACO Left 09/03/2017   Procedure: CATARACT EXTRACTION PHACO AND INTRAOCULAR LENS PLACEMENT (IOC)-LEFT DIABETIC;  Surgeon: PBirder Robson MD;  Location: [facility] ORS;  Service: Ophthalmology;  Laterality: Left;  [place]:10.2 AP% 16.7 CDE 11.71 Fluid Pack Lot # [id number]  CATARACT EXTRACTION W/PHACO Right 09/30/2017   Procedure: CATARACT EXTRACTION PHACO AND INTRAOCULAR LENS PLACEMENT (IOC);  Surgeon: PBirder Robson MD;  Location: [facility] ORS;  Service: Ophthalmology;  Laterality: Right;  [place]:35.1 AP% 13.1 CDE 12.44 Fluid pack Lot # [id number]   COLON SURGERY     EYE SURGERY Left 09/03/2017   Cataract extraction   FEMUR FRACTURE SURGERY Left    FRACTURE SURGERY  2001   left femur   HEMORROIDECTOMY     polyp removed from vocal cord      Social History   Socioeconomic History   Marital status: Divorced    Spouse name: Not on file   Number of children: 6   Years of education: Not on file   Highest education level: 10th grade  Occupational History   Not on  file  Social Needs   Financial resource strain: Somewhat hard   Food insecurity:    Worry: Never true    Inability: Never true   Transportation needs:    Medical: No    Non-medical: No  Tobacco Use   Smoking status: Never Smoker   Smokeless tobacco: Never Used  Substance and Sexual Activity   Alcohol use: No    Alcohol/week: 0.0 standard drinks   Drug use: No   Sexual activity: Not Currently  Lifestyle   Physical activity:    Days per week: 7 days    Minutes per session: 30 min   Stress: Not at all  Relationships   Social connections:    Talks on phone: More than three times a week    Gets together: Twice a week    Attends religious service: More than 4 times per year    Active member of club or organization: Yes    Attends meetings of  clubs or organizations: 1 to 4 times per year    Relationship status: Divorced   Intimate partner violence:    Fear of current or ex partner: No    Emotionally abused: No    Physically abused: No    Forced sexual activity: No  Other Topics Concern   Not on file  Social History Narrative   Not on file     Family History  Problem Relation Age of Onset   Diabetes Mother    Hypertension Mother    Dementia Mother    Hypertension Son      Current Outpatient Medications:    apixaban (ELIQUIS) 5 MG TABS tablet, Take 1 tablet (5 mg total) by mouth 2 (two) times daily., Disp: 180 tablet, Rfl: 1   Blood Glucose Monitoring Suppl (ACCU-CHEK AVIVA PLUS) w/Device KIT, 1 kit by Does not apply route 3 (three) times daily., Disp: 1 kit, Rfl: 0   carvedilol (COREG) 25 MG tablet, Take 1 tablet (25 mg total) by mouth 2 (two) times daily. New dose, Disp: 180 tablet, Rfl: 3   COMBIGAN 0.2-0.5 % ophthalmic solution, Place 1 drop into both eyes 2 (two) times daily., Disp: , Rfl:    Cyanocobalamin (B-12) 1000 MCG TABS, Take 1,000 mcg daily by mouth., Disp: , Rfl:    diclofenac sodium (VOLTAREN) 1 % GEL, Apply 4 g topically 4 (four) times daily. (Patient taking differently: Apply 4 g topically 4 (four) times daily as needed (for pain). ), Disp: 300 g, Rfl: 1   Dulaglutide (TRULICITY) 1.5 IR/5.1OA SOPN, Inject 1.5 mg into the skin every Thursday., Disp: 12 pen, Rfl: 1   empagliflozin (JARDIANCE) 25 MG TABS tablet, Take 25 mg by mouth daily., Disp: 90 tablet, Rfl: 1   glucose blood (ACCU-CHEK AVIVA PLUS) test strip, Check fasting glucose once each morning, and check glucose as needed., Disp: 100 each, Rfl: 12   levothyroxine (SYNTHROID, LEVOTHROID) 50 MCG tablet, Take 50 mcg by mouth daily before breakfast. , Disp: , Rfl:    loratadine (CLARITIN) 10 MG tablet, TAKE 1 TABLET (10 MG TOTAL) BY MOUTH DAILY., Disp: 90 tablet, Rfl: 2   metFORMIN (GLUCOPHAGE) 850 MG tablet, Take 1 tablet (850 mg  total) by mouth 2 (two) times daily., Disp: 180 tablet, Rfl: 1   olmesartan-hydrochlorothiazide (BENICAR HCT) 40-25 MG tablet, Take 1 tablet by mouth daily., Disp: 90 tablet, Rfl: 0   ondansetron (ZOFRAN) 4 MG tablet, Take 1 tablet (4 mg total) by mouth every 8 (eight) hours as needed for nausea  or vomiting., Disp: 20 tablet, Rfl: 0   pantoprazole (PROTONIX) 40 MG tablet, Take 1 tablet (40 mg total) by mouth daily., Disp: 30 tablet, Rfl: 0   pravastatin (PRAVACHOL) 40 MG tablet, TAKE 1 TABLET BY MOUTH EVERY DAY, Disp: 90 tablet, Rfl: 1   traMADol (ULTRAM) 50 MG tablet, Take 1 tablet (50 mg total) by mouth every 8 (eight) hours as needed for up to 5 days., Disp: 20 tablet, Rfl: 0   Travoprost, BAK Free, (TRAVATAN) 0.004 % SOLN ophthalmic solution, Place 1 drop into both eyes at bedtime., Disp: , Rfl:    Vitamin D, Ergocalciferol, (DRISDOL) 1.25 MG (50000 UT) CAPS capsule, Take weekly, Disp: 12 capsule, Rfl: 1   Lancets (ACCU-CHEK MULTICLIX) lancets, Use as instructed, Disp: 204 each, Rfl: 12   nitrofurantoin, macrocrystal-monohydrate, (MACROBID) 100 MG capsule, Take 1 capsule (100 mg total) by mouth 2 (two) times daily., Disp: 10 capsule, Rfl: 0   Physical exam:  Vitals:   03/30/19 1408  BP: (!) 153/89  Pulse: 84  Resp: 18  Temp: 98.7 F (37.1 C)  TempSrc: Oral  Weight: 215 lb 8 oz (97.8 kg)  Height: 5' 3"  (1.6 m)   Physical Exam Constitutional:      General: She is not in acute distress.    Appearance: She is obese.  HENT:     Head: Normocephalic and atraumatic.  Eyes:     Pupils: Pupils are equal, round, and reactive to light.  Neck:     Musculoskeletal: Normal range of motion.  Cardiovascular:     Rate and Rhythm: Normal rate and regular rhythm.     Heart sounds: Normal heart sounds.  Pulmonary:     Effort: Pulmonary effort is normal.     Breath sounds: Normal breath sounds.  Abdominal:     General: Bowel sounds are normal. There is no distension.     Palpations:  Abdomen is soft.     Tenderness: There is no abdominal tenderness.     Comments: No palpable hepatosplenomegaly  Lymphadenopathy:     Comments: No palpable cervical, supraclavicular, axillary or inguinal adenopathy   Skin:    General: Skin is warm and dry.  Neurological:     Mental Status: She is alert and oriented to person, place, and time.        CMP Latest Ref Rng & Units 03/26/2019  Glucose 70 - 99 mg/dL 155(H)  BUN 8 - 23 mg/dL 19  Creatinine 0.44 - 1.00 mg/dL 1.34(H)  Sodium 135 - 145 mmol/L 140  Potassium 3.5 - 5.1 mmol/L 3.7  Chloride 98 - 111 mmol/L 103  CO2 22 - 32 mmol/L 27  Calcium 8.9 - 10.3 mg/dL 8.9  Total Protein 6.5 - 8.1 g/dL 6.7  Total Bilirubin 0.3 - 1.2 mg/dL 0.2(L)  Alkaline Phos 38 - 126 U/L 51  AST 15 - 41 U/L 12(L)  ALT 0 - 44 U/L 10   CBC Latest Ref Rng & Units 03/26/2019  WBC 4.0 - 10.5 K/uL 10.1  Hemoglobin 12.0 - 15.0 g/dL 11.5(L)  Hematocrit 36.0 - 46.0 % 38.9  Platelets 150 - 400 K/uL 308    No images are attached to the encounter.  Ct Renal Stone Study  Result Date: 03/26/2019 CLINICAL DATA:  Right-sided flank pain x1 week. The patient has a history of prior partial hysterectomy. EXAM: CT ABDOMEN AND PELVIS WITHOUT CONTRAST TECHNIQUE: Multidetector CT imaging of the abdomen and pelvis was performed following the standard protocol without IV contrast. COMPARISON:  None. FINDINGS: Lower chest: The heart size is enlarged. Thoracic aortic calcifications are noted. There is a stable peripheral pulmonary nodule at the right lung base. No further follow-up is required for this. Hepatobiliary: No focal liver abnormality is seen. There is a slight geographic area of increased attenuation in hepatic segment 3/4 favored to be artifact. No gallstones, gallbladder wall thickening, or biliary dilatation. Pancreas: Unremarkable. No pancreatic ductal dilatation or surrounding inflammatory changes. Spleen: Normal in size without focal abnormality.  Adrenals/Urinary Tract: Adrenal glands are unremarkable. Kidneys are normal, without renal calculi, focal lesion, or hydronephrosis. Bladder is unremarkable. Stomach/Bowel: Stomach is within normal limits. The appendix is not reliably identified, however there are no significant inflammatory changes he in the right lower quadrant. No evidence of bowel wall thickening, distention, or inflammatory changes. Vascular/Lymphatic: Aortic calcifications are noted. There are no pathologically enlarged retroperitoneal lymph nodes. There are multiple pathologically enlarged lymph nodes in the right lower quadrant measuring up to 2.6 cm (axial series 2, image 55). There is an additional enlarged lymph node in the right lower quadrant measuring 2.2 cm (axial series 2, image 53). Reproductive: Status post hysterectomy. No adnexal masses. Other: No abdominal wall hernia or abnormality. No abdominopelvic ascites. There is a small fat containing 1.8 cm lesion in the omentum of the low anterior abdomen (axial series 2, image 58). This is likely benign given its fat content. Musculoskeletal: No acute or significant osseous findings. IMPRESSION: 1. No acute intra-abdominal abnormality detected. No hydronephrosis. No radiopaque obstructing kidney stones. 2. Pathologically enlarged lymph nodes in the right lower quadrant as detailed above, measuring up to 2.6 cm. Findings are concerning for lymphoma or other malignant process. Follow-up is recommended. 3. The appendix is not reliably identified, however there are no significant inflammatory changes in the right lower quadrant. 4. Mild cardiomegaly. Aortic Atherosclerosis (ICD10-I70.0). These results will be called to the ordering clinician or representative by the Radiologist Assistant, and communication documented in the PACS or zVision Dashboard. Electronically Signed   By: Constance Holster M.D.   On: 03/26/2019 15:50    Assessment and plan- Patient is a 85 y.o. female referred  for intra-abdominal right lower quadrant adenopathy with findings concerning for lymphoma or some other malignancy  I have reviewed CT renal stone study images independently and discussed findings with the patient.  Patient has multiple enlarged right lower quadrant abdominal lymph nodes which are concerning for lymphoma.  There was no significant inflammatory changes distention or thickening noted in the right colon that could be suggestive of infection or colon cancer. Patient had a recent CBC and CMP done on 03/26/2019.  CBC showed mild anemia with a hemoglobin of 11.5.  White count and platelet count were normal.  CMP was normal except for a mildly elevated serum creatinine of 1.3.  At this time I will proceed with a PET CT scan to see which of these lymph nodes are hypermetabolic.  I will then touch base with interventional radiology to see if IR guided core biopsy of the lymph nodes would be feasible.   I will tentatively see her back in 2 weeks time after PET CT scan and biopsy to discuss the results of biopsy and further management.  Depending on the results of pathology I will decide what labs will need to be checked in 2 weeks time  I have discussed her plan with her daughter as well in detail  Thank you for this kind referral and the opportunity to participate in the care of  this patient   Visit Diagnosis 1. Adenopathy   2. Abnormal CT scan     Dr. Randa Evens, MD, MPH [facility] [phone] 03/31/2019 1:23 PM

## 2019-03-31 NOTE — Telephone Encounter (Signed)
Susan Bowen called back and aware ok to switch to an available provider.  Susan Bowen will schedule the pt for tomorrow with Dr Marius Ditch. Nothing further needed.

## 2019-03-31 NOTE — Telephone Encounter (Signed)
Copied from [place] [id number]. Topic: General - Inquiry >> Mar 31, 2019 10:27 AM Sheran Luz wrote: Reason for CRM: Melody, with [facility] requesting call back from Dr. Ancil Boozer CMA as referral was sent urgent for patient to see Dr Vicente Males, however patient is able to see another provider tomorrow (Dr. Sisseton Desanctis) upon approval. Melody states that their offices closes at 12pm today.  Please advise if it is ok to switch providers.

## 2019-04-01 ENCOUNTER — Other Ambulatory Visit: Payer: Self-pay

## 2019-04-01 ENCOUNTER — Other Ambulatory Visit
Admission: RE | Admit: 2019-04-01 | Discharge: 2019-04-01 | Disposition: A | Discharge status: Home / Self Care | Payer: Medicare Other | Source: Ambulatory Visit | Attending: Oncology | Admitting: Oncology

## 2019-04-01 ENCOUNTER — Encounter: Payer: Self-pay | Admitting: Gastroenterology

## 2019-04-01 ENCOUNTER — Ambulatory Visit (INDEPENDENT_AMBULATORY_CARE_PROVIDER_SITE_OTHER): Payer: Medicare Other | Admitting: Gastroenterology

## 2019-04-01 ENCOUNTER — Other Ambulatory Visit: Payer: Self-pay | Admitting: Cardiovascular Disease

## 2019-04-01 ENCOUNTER — Telehealth: Payer: Self-pay | Admitting: Cardiovascular Disease

## 2019-04-01 VITALS — BP 133/79 | HR 91 | Resp 17 | Ht 63.0 in | Wt 213.6 lb

## 2019-04-01 DIAGNOSIS — R599 Enlarged lymph nodes, unspecified: Secondary | ICD-10-CM

## 2019-04-01 DIAGNOSIS — R1013 Epigastric pain: Secondary | ICD-10-CM | POA: Diagnosis not present

## 2019-04-01 DIAGNOSIS — R590 Localized enlarged lymph nodes: Secondary | ICD-10-CM

## 2019-04-01 DIAGNOSIS — R591 Generalized enlarged lymph nodes: Secondary | ICD-10-CM | POA: Diagnosis not present

## 2019-04-01 LAB — COMPREHENSIVE METABOLIC PANEL
ALT: 9 U/L (ref 0–44)
AST: 15 U/L (ref 15–41)
Albumin: 3.9 g/dL (ref 3.5–5.0)
Alkaline Phosphatase: 55 U/L (ref 38–126)
Anion gap: 10 (ref 5–15)
BUN: 13 mg/dL (ref 8–23)
CO2: 26 mmol/L (ref 22–32)
Calcium: 9.6 mg/dL (ref 8.9–10.3)
Chloride: 105 mmol/L (ref 98–111)
Creatinine, Ser: 1.15 mg/dL — ABNORMAL HIGH (ref 0.44–1.00)
GFR calc Af Amer: 50 mL/min — ABNORMAL LOW (ref >60)
GFR calc non Af Amer: 43 mL/min — ABNORMAL LOW (ref >60)
Glucose, Bld: 154 mg/dL — ABNORMAL HIGH (ref 70–99)
Potassium: 3.8 mmol/L (ref 3.5–5.1)
Sodium: 141 mmol/L (ref 135–145)
Total Bilirubin: 0.4 mg/dL (ref 0.3–1.2)
Total Protein: 7.4 g/dL (ref 6.5–8.1)

## 2019-04-01 LAB — CBC WITH DIFFERENTIAL/PLATELET
Abs Immature Granulocytes: 0.02 10*3/uL (ref 0.00–0.07)
Basophils Absolute: 0.1 10*3/uL (ref 0.0–0.1)
Basophils Relative: 1 %
Eosinophils Absolute: 0.3 10*3/uL (ref 0.0–0.5)
Eosinophils Relative: 3 %
HCT: 39.5 % (ref 36.0–46.0)
Hemoglobin: 12 g/dL (ref 12.0–15.0)
Immature Granulocytes: 0 %
Lymphocytes Relative: 32 %
Lymphs Abs: 3.2 10*3/uL (ref 0.7–4.0)
MCH: 26 pg (ref 26.0–34.0)
MCHC: 30.4 g/dL (ref 30.0–36.0)
MCV: 85.5 fL (ref 80.0–100.0)
Monocytes Absolute: 0.6 10*3/uL (ref 0.1–1.0)
Monocytes Relative: 6 %
Neutro Abs: 5.7 10*3/uL (ref 1.7–7.7)
Neutrophils Relative %: 58 %
Platelets: 321 10*3/uL (ref 150–400)
RBC: 4.62 MIL/uL (ref 3.87–5.11)
RDW: 15.9 % — ABNORMAL HIGH (ref 11.5–15.5)
WBC: 9.8 10*3/uL (ref 4.0–10.5)
nRBC: 0 % (ref 0.0–0.2)

## 2019-04-01 LAB — URIC ACID: Uric Acid, Serum: 8 mg/dL — ABNORMAL HIGH (ref 2.5–7.1)

## 2019-04-01 LAB — LACTATE DEHYDROGENASE: LDH: 115 U/L (ref 98–192)

## 2019-04-01 NOTE — Telephone Encounter (Signed)
° °  [facility] Pre-operative Risk Assessment    Request for surgical clearance:  1. What type of surgery is being performed? EGD / Colonoscopy   2. When is this surgery scheduled? 04/14/2019   3. What type of clearance is required (medical clearance vs. Pharmacy clearance to hold med vs. Both)? Both   4. Are there any medications that need to be held prior to surgery and how long?  Eliquis 5MG - instructions before and after procedure    5. Practice name and name of physician performing surgery? [facility]    6. What is your office phone number [phone]   7.   What is your office fax number [phone], attn Temeka G.  8.   Anesthesia type (None, local, MAC, general) ? None listed   Ace Gins 04/01/2019, 4:51 PM  _________________________________________________________________   (provider comments below)

## 2019-04-01 NOTE — Progress Notes (Signed)
Cephas Darby, MD [address]  [place]  [place], [place] [address]  Main: [phone]  Fax: [phone]    Gastroenterology Consultation  Referring Provider:     Steele Sizer, MD Primary Care Physician:  Steele Sizer, MD Primary Gastroenterologist:  Dr. Cephas Darby Reason for Consultation:     Dyspepsia, abdominal lymphadenopathy        HPI:   Susan Bowen is a 85 y.o. female referred by Dr. Steele Sizer, MD  for consultation & management of 1 month history of dyspepsia, incidental finding of abdominal lymphadenopathy.  Patient has history of A. fib on Eliquis.  Patient has been experiencing early satiety, bloating, indigestion, upper abdominal discomfort for the last 1 month.  She is taking Protonix started by her PCP which is providing partial relief.  She has cut back on carbonated beverages, red meat.  She also had right-sided flank pain for which she underwent CT renal stone study on 03/26/2019 which revealed significant lymphadenopathy in the right lower abdomen.  She is undergoing PET scan on 04/07/2019, has follow-up with Dr. Janese Banks, oncologist on 04/12/2019.  Patient reports losing about 3 pounds in last few weeks.  She denies having any change in bowel habits.  She had colonoscopy in 2008 and was supposed to undergo surveillance colonoscopy in 5 years for follow-up of polyps which she did not do.  She does not have anemia  NSAIDs: None  Antiplts/Anticoagulants/Anti thrombotics: Eliquis for A. fib  GI Procedures: Colonoscopy in 2008, polyps removed polpectomy, repeat 5 years Dr. Tiffany Kocher   Past Medical History:  Diagnosis Date  . Allergy   . Back spasm   . Bunion   . Carpal tunnel syndrome   . Cataracta   . Diabetes mellitus without complication ([place])   . Generalized osteoarthritis   . Gout   . Hemorrhoids without complication   . Hyperlipidemia   . Hypertension   . Hypothyroidism   . Impingement syndrome of right shoulder   . Insomnia   . Lipoma    . Lung nodule   . Neuritis or radiculitis due to rupture of lumbar intervertebral disc   . Obesity   . Osteoporosis   . PAF (paroxysmal atrial fibrillation) ([place])    a. 03/2018 AF w/ RVR-->converted spont.  CHA2DS2VASc = 7-->Eliquis.  . Proteinuria   . Rotator cuff tear   . Systolic murmur    a. 05/7033 Echo: EF 60-65%, no rwma, Gr1 DD, mild MR, mildly dil LA. Nl RV fxn. PASP 30mHg.  .Marland KitchenTIA (transient ischemic attack)   . TIA (transient ischemic attack) 1998   small TIA.  no residual effects  . Tinnitus of both ears   . Unspecified glaucoma(365.9)   . Vitamin D deficiency   . Wedge compression fracture of t11-T12 vertebra, sequela     Past Surgical History:  Procedure Laterality Date  . ABDOMINAL HYSTERECTOMY  1986  . APPENDECTOMY    . BREAST SURGERY Bilateral 1983   biopsy of each side, negative  . CARPAL TUNNEL RELEASE    . CATARACT EXTRACTION W/PHACO Left 09/03/2017   Procedure: CATARACT EXTRACTION PHACO AND INTRAOCULAR LENS PLACEMENT (IOC)-LEFT DIABETIC;  Surgeon: PBirder Robson MD;  Location: [facility] ORS;  Service: Ophthalmology;  Laterality: Left;  [place]:10.2 AP% 16.7 CDE 11.71 Fluid Pack Lot # [id number] . CATARACT EXTRACTION W/PHACO Right 09/30/2017   Procedure: CATARACT EXTRACTION PHACO AND INTRAOCULAR LENS PLACEMENT (IOC);  Surgeon: PBirder Robson MD;  Location: [facility] ORS;  Service: Ophthalmology;  Laterality: Right;  [place] 01:35.1 AP% 13.1 CDE 12.44 Fluid pack Lot # [id number]  . COLON SURGERY    . EYE SURGERY Left 09/03/2017   Cataract extraction  . FEMUR FRACTURE SURGERY Left   . FRACTURE SURGERY  2001   left femur  . HEMORROIDECTOMY    . polyp removed from vocal cord      Current Outpatient Medications:  .  apixaban (ELIQUIS) 5 MG TABS tablet, Take 1 tablet (5 mg total) by mouth 2 (two) times daily., Disp: 180 tablet, Rfl: 1 .  Blood Glucose Monitoring Suppl (ACCU-CHEK AVIVA PLUS) w/Device KIT, 1 kit by Does not apply route 3 (three) times daily., Disp: 1  kit, Rfl: 0 .  carvedilol (COREG) 25 MG tablet, Take 1 tablet (25 mg total) by mouth 2 (two) times daily. New dose, Disp: 180 tablet, Rfl: 3 .  COMBIGAN 0.2-0.5 % ophthalmic solution, Place 1 drop into both eyes 2 (two) times daily., Disp: , Rfl:  .  Cyanocobalamin (B-12) 1000 MCG TABS, Take 1,000 mcg daily by mouth., Disp: , Rfl:  .  diclofenac sodium (VOLTAREN) 1 % GEL, Apply 4 g topically 4 (four) times daily. (Patient taking differently: Apply 4 g topically 4 (four) times daily as needed (for pain). ), Disp: 300 g, Rfl: 1 .  Dulaglutide (TRULICITY) 1.5 FI/4.3PI SOPN, Inject 1.5 mg into the skin every Thursday., Disp: 12 pen, Rfl: 1 .  empagliflozin (JARDIANCE) 25 MG TABS tablet, Take 25 mg by mouth daily., Disp: 90 tablet, Rfl: 1 .  glucose blood (ACCU-CHEK AVIVA PLUS) test strip, Check fasting glucose once each morning, and check glucose as needed., Disp: 100 each, Rfl: 12 .  Lancets (ACCU-CHEK MULTICLIX) lancets, Use as instructed, Disp: 204 each, Rfl: 12 .  levothyroxine (SYNTHROID, LEVOTHROID) 50 MCG tablet, Take 50 mcg by mouth daily before breakfast. , Disp: , Rfl:  .  loratadine (CLARITIN) 10 MG tablet, TAKE 1 TABLET (10 MG TOTAL) BY MOUTH DAILY., Disp: 90 tablet, Rfl: 2 .  metFORMIN (GLUCOPHAGE) 850 MG tablet, Take 1 tablet (850 mg total) by mouth 2 (two) times daily., Disp: 180 tablet, Rfl: 1 .  nitrofurantoin, macrocrystal-monohydrate, (MACROBID) 100 MG capsule, Take 1 capsule (100 mg total) by mouth 2 (two) times daily., Disp: 10 capsule, Rfl: 0 .  olmesartan-hydrochlorothiazide (BENICAR HCT) 40-25 MG tablet, TAKE 1 TABLET BY MOUTH EVERY DAY, Disp: 90 tablet, Rfl: 0 .  pantoprazole (PROTONIX) 40 MG tablet, Take 1 tablet (40 mg total) by mouth daily., Disp: 30 tablet, Rfl: 0 .  pravastatin (PRAVACHOL) 40 MG tablet, TAKE 1 TABLET BY MOUTH EVERY DAY, Disp: 90 tablet, Rfl: 1 .  Travoprost, BAK Free, (TRAVATAN) 0.004 % SOLN ophthalmic solution, Place 1 drop into both eyes at bedtime., Disp:  , Rfl:  .  Vitamin D, Ergocalciferol, (DRISDOL) 1.25 MG (50000 UT) CAPS capsule, Take weekly, Disp: 12 capsule, Rfl: 1 .  ondansetron (ZOFRAN) 4 MG tablet, Take 1 tablet (4 mg total) by mouth every 8 (eight) hours as needed for nausea or vomiting. (Patient not taking: Reported on 04/01/2019), Disp: 20 tablet, Rfl: 0    Family History  Problem Relation Age of Onset  . Diabetes Mother   . Hypertension Mother   . Dementia Mother   . Hypertension Son      Social History   Tobacco Use  . Smoking status: Never Smoker  . Smokeless tobacco: Never Used  Substance Use Topics  . Alcohol use: No    Alcohol/week: 0.0 standard drinks  .  Drug use: No    Allergies as of 04/01/2019 - Review Complete 04/01/2019  Allergen Reaction Noted  . Augmentin [amoxicillin-pot clavulanate] Itching 03/21/2015    Review of Systems:    All systems reviewed and negative except where noted in HPI.   Physical Exam:  BP 133/79 (BP Location: Left Arm, Patient Position: Sitting, Cuff Size: Large)   Pulse 91   Resp 17   Ht 5' 3"  (1.6 m)   Wt 213 lb 9.6 oz (96.9 kg)   LMP  (LMP Unknown)   BMI 37.84 kg/m  No LMP recorded (lmp unknown). Patient is postmenopausal.  General:   Alert,  Well-developed, well-nourished, pleasant and cooperative in NAD Head:  Normocephalic and atraumatic. Eyes:  Sclera clear, no icterus.   Conjunctiva pink. Ears:  Normal auditory acuity. Nose:  No deformity, discharge, or lesions. Mouth:  No deformity or lesions,oropharynx pink & moist. Neck:  Supple; no masses or thyromegaly. Lungs:  Respirations even and unlabored.  Clear throughout to auscultation.   No wheezes, crackles, or rhonchi. No acute distress. Heart:  Regular rate and rhythm; no murmurs, clicks, rubs, or gallops. Abdomen:  Normal bowel sounds. Soft, obese, non-tender and non-distended without masses, hepatosplenomegaly or hernias noted.  No guarding or rebound tenderness.   Rectal: Not performed Msk:  Symmetrical  without gross deformities. Good, equal movement & strength bilaterally. Pulses:  Normal pulses noted. Extremities:  No clubbing or edema.  No cyanosis. Neurologic:  Alert and oriented x3;  grossly normal neurologically. Skin:  Intact without significant lesions or rashes. No jaundice. Psych:  Alert and cooperative. Normal mood and affect.  Imaging Studies: Reviewed  Assessment and Plan:   Susan Bowen is a 85 y.o. present female with history of A. fib on Eliquis, metabolic syndrome with 1 month history of dyspepsia, right flank pain, found to have abdominal lymphadenopathy on CT scan  Dyspepsia Continue Protonix 40 mg daily Recommend EGD for further evaluation  Abdominal lymphadenopathy and history of colon polyps Overdue for colonoscopy Recommend colonoscopy to evaluate for colonic malignancy   Follow up in 1 month   Cephas Darby, MD

## 2019-04-02 NOTE — Progress Notes (Signed)
Stop Eliquis 2 days before procedure and resume the day after unless instructed otherwise by the performing gastroenterologist.

## 2019-04-02 NOTE — Telephone Encounter (Signed)
Left voice mail to call back to go over cardiac symptoms.  

## 2019-04-02 NOTE — Telephone Encounter (Signed)
Patient with diagnosis of afib on Eliquis for anticoagulation.    Procedure: EGD / Colonoscopy  Date of procedure: 04/14/2019  CHADS2-VASc score of  7 (CHF, HTN, AGE, DM2, stroke/tia x 2, CAD, AGE, female)  CrCl 28ml/min  Per office protocol, patient can hold Eliquis for 1 day prior to procedure.  Patient may resume Eliquis the following day.

## 2019-04-02 NOTE — Telephone Encounter (Signed)
Again left OV to call back

## 2019-04-05 ENCOUNTER — Ambulatory Visit: Payer: Medicare Other | Admitting: Family Medicine

## 2019-04-05 NOTE — Telephone Encounter (Signed)
   Primary Cardiologist: Kathlyn Sacramento, MD  Chart reviewed as part of pre-operative protocol coverage. Patient was contacted 04/05/2019 in reference to pre-operative risk assessment for pending surgery as outlined below.  Susan Bowen was last seen on 01/19/19 by Dr. Fletcher Anon. She has history of PAF, mild aortic stenosis, essential HTN, DM, hyperlipidemia, PACs, hypothyroidism, obesity, TIA.   Vin tried to call patient last week and got no answer. I called her mobile, got no answer, did not go to VM. I called home phone and LMOM to call us back. As long as no new symptoms, should be fine to proceed as discussed.   Per pharmD recommendations, "Per office protocol, patient can hold Eliquis for 1 day prior to procedure.  Patient may resume Eliquis the following day." Especially important to note is patient's history of TIA, therefore time off anticoagulation should be minimized as much as possible.  Callback, please let Mead Gertie Fey know we received their clearance but are just waiting on call back from pt per our protocol. Tell them if she happens to call them for them to remind her to call us.  Charlie Pitter, PA-C 04/05/2019, 9:45 AM

## 2019-04-05 NOTE — Telephone Encounter (Signed)
Follow up  ° ° °Pt is returning call  ° ° °Please call back  °

## 2019-04-05 NOTE — Telephone Encounter (Signed)
   Primary Cardiologist: Kathlyn Sacramento, MD  Chart reviewed as part of pre-operative protocol coverage. Patient was contacted 04/05/2019 in reference to pre-operative risk assessment for pending surgery as outlined below.  [name] was last seen on 01/19/19 by Dr. Fletcher Anon. She has history of PAF, mild aortic stenosis, essential HTN, DM, hyperlipidemia, PACs, hypothyroidism, obesity, TIA. I spoke with patient who affirms she's been completely stable from a heart standpoint without any CP, SOB, palpitations, syncope or dizziness. Therefore, based on ACC/AHA guidelines, Justeen B Stammer would be at acceptable risk for the planned procedure without further cardiovascular testing.   Per pharmD recommendations, "Per office protocol, patient can holdEliquisfor 1day prior to procedure. Patient may resume Eliquis the following day." Especially important to note is patient's history of TIA, therefore time off anticoagulation should be minimized as much as possible.  I will route this recommendation to the requesting party via Epic fax function and remove from pre-op pool.  Please call with questions.  Charlie Pitter, PA-C 04/05/2019, 9:45 AM

## 2019-04-05 NOTE — Telephone Encounter (Signed)
Routed to [employer] to let them know we have reached out to pt and are awaiting on a call back.

## 2019-04-07 ENCOUNTER — Other Ambulatory Visit: Payer: Self-pay

## 2019-04-07 ENCOUNTER — Encounter
Admission: RE | Admit: 2019-04-07 | Discharge: 2019-04-07 | Disposition: A | Discharge status: Home / Self Care | Payer: Medicare Other | Source: Ambulatory Visit | Attending: Oncology | Admitting: Oncology

## 2019-04-07 DIAGNOSIS — I700 Atherosclerosis of aorta: Secondary | ICD-10-CM | POA: Diagnosis not present

## 2019-04-07 DIAGNOSIS — R9389 Abnormal findings on diagnostic imaging of other specified body structures: Secondary | ICD-10-CM

## 2019-04-07 DIAGNOSIS — I2510 Atherosclerotic heart disease of native coronary artery without angina pectoris: Secondary | ICD-10-CM | POA: Insufficient documentation

## 2019-04-07 DIAGNOSIS — R599 Enlarged lymph nodes, unspecified: Secondary | ICD-10-CM

## 2019-04-07 DIAGNOSIS — R591 Generalized enlarged lymph nodes: Secondary | ICD-10-CM | POA: Insufficient documentation

## 2019-04-07 LAB — GLUCOSE, CAPILLARY: Glucose-Capillary: 149 mg/dL — ABNORMAL HIGH (ref 70–99)

## 2019-04-07 MED ORDER — FLUDEOXYGLUCOSE F - 18 (FDG) INJECTION
11.0000 | Freq: Once | INTRAVENOUS | Status: AC | PRN
  Administered 2019-04-07: 10.88 via INTRAVENOUS

## 2019-04-08 ENCOUNTER — Telehealth: Payer: Self-pay | Admitting: Gastroenterology

## 2019-04-08 ENCOUNTER — Other Ambulatory Visit: Payer: Self-pay | Admitting: Oncology

## 2019-04-08 NOTE — Progress Notes (Signed)
umor 

## 2019-04-08 NOTE — Telephone Encounter (Signed)
See the note from Dr Fletcher Anon. He cleared her and communicate the same with patient "She can Stop Eliquis 2 days before procedure and resume the day after unless instructed otherwise by the performing gastroenterologist."  She doesn't need to stop for 5 days  Thanks RV

## 2019-04-08 NOTE — Telephone Encounter (Signed)
Patient call & states she is schedule for colonoscopy & her cardiologist states she is not able to be off her blood thinner for 5 days only 1. She would like to discuss this further or r/s,

## 2019-04-08 NOTE — Telephone Encounter (Signed)
Please advise 

## 2019-04-09 ENCOUNTER — Other Ambulatory Visit: Admission: RE | Admit: 2019-04-09 | Payer: Medicare Other | Source: Ambulatory Visit

## 2019-04-12 ENCOUNTER — Encounter: Payer: Self-pay | Admitting: Oncology

## 2019-04-12 ENCOUNTER — Other Ambulatory Visit: Payer: Self-pay | Admitting: Oncology

## 2019-04-12 ENCOUNTER — Inpatient Hospital Stay: Payer: Medicare Other

## 2019-04-12 ENCOUNTER — Other Ambulatory Visit: Payer: Self-pay

## 2019-04-12 ENCOUNTER — Inpatient Hospital Stay: Payer: Medicare Other | Attending: Oncology | Admitting: Oncology

## 2019-04-12 VITALS — BP 138/73 | HR 81 | Temp 97.6°F | Wt 214.4 lb

## 2019-04-12 DIAGNOSIS — Z7901 Long term (current) use of anticoagulants: Secondary | ICD-10-CM | POA: Insufficient documentation

## 2019-04-12 DIAGNOSIS — N189 Chronic kidney disease, unspecified: Secondary | ICD-10-CM | POA: Insufficient documentation

## 2019-04-12 DIAGNOSIS — I129 Hypertensive chronic kidney disease with stage 1 through stage 4 chronic kidney disease, or unspecified chronic kidney disease: Secondary | ICD-10-CM | POA: Insufficient documentation

## 2019-04-12 DIAGNOSIS — Z79899 Other long term (current) drug therapy: Secondary | ICD-10-CM | POA: Diagnosis not present

## 2019-04-12 DIAGNOSIS — G4700 Insomnia, unspecified: Secondary | ICD-10-CM | POA: Diagnosis not present

## 2019-04-12 DIAGNOSIS — I272 Pulmonary hypertension, unspecified: Secondary | ICD-10-CM | POA: Insufficient documentation

## 2019-04-12 DIAGNOSIS — R109 Unspecified abdominal pain: Secondary | ICD-10-CM

## 2019-04-12 DIAGNOSIS — Z7984 Long term (current) use of oral hypoglycemic drugs: Secondary | ICD-10-CM | POA: Diagnosis not present

## 2019-04-12 DIAGNOSIS — E039 Hypothyroidism, unspecified: Secondary | ICD-10-CM | POA: Insufficient documentation

## 2019-04-12 DIAGNOSIS — Z791 Long term (current) use of non-steroidal anti-inflammatories (NSAID): Secondary | ICD-10-CM | POA: Insufficient documentation

## 2019-04-12 DIAGNOSIS — R911 Solitary pulmonary nodule: Secondary | ICD-10-CM | POA: Diagnosis not present

## 2019-04-12 DIAGNOSIS — I480 Paroxysmal atrial fibrillation: Secondary | ICD-10-CM | POA: Diagnosis not present

## 2019-04-12 DIAGNOSIS — R011 Cardiac murmur, unspecified: Secondary | ICD-10-CM | POA: Diagnosis not present

## 2019-04-12 DIAGNOSIS — M109 Gout, unspecified: Secondary | ICD-10-CM

## 2019-04-12 DIAGNOSIS — I700 Atherosclerosis of aorta: Secondary | ICD-10-CM | POA: Diagnosis not present

## 2019-04-12 DIAGNOSIS — E669 Obesity, unspecified: Secondary | ICD-10-CM | POA: Diagnosis not present

## 2019-04-12 DIAGNOSIS — E119 Type 2 diabetes mellitus without complications: Secondary | ICD-10-CM | POA: Diagnosis not present

## 2019-04-12 DIAGNOSIS — M810 Age-related osteoporosis without current pathological fracture: Secondary | ICD-10-CM | POA: Diagnosis not present

## 2019-04-12 DIAGNOSIS — E559 Vitamin D deficiency, unspecified: Secondary | ICD-10-CM | POA: Diagnosis not present

## 2019-04-12 DIAGNOSIS — R590 Localized enlarged lymph nodes: Secondary | ICD-10-CM | POA: Insufficient documentation

## 2019-04-12 DIAGNOSIS — E785 Hyperlipidemia, unspecified: Secondary | ICD-10-CM | POA: Diagnosis not present

## 2019-04-12 DIAGNOSIS — R591 Generalized enlarged lymph nodes: Secondary | ICD-10-CM

## 2019-04-12 DIAGNOSIS — R948 Abnormal results of function studies of other organs and systems: Secondary | ICD-10-CM

## 2019-04-12 DIAGNOSIS — Z8673 Personal history of transient ischemic attack (TIA), and cerebral infarction without residual deficits: Secondary | ICD-10-CM | POA: Diagnosis not present

## 2019-04-12 NOTE — Progress Notes (Signed)
Hematology/Oncology Consult note [facility]  [phone] [phone]  Patient Care Team: Steele Sizer, MD as PCP - General (Family Medicine) Wellington Hampshire, MD as PCP - Cardiology (Cardiology)   Name of the patient: Susan Bowen  [id number]  Mar 22, 1932   Date of visit: 04/12/19  Diagnosis-intra-abdominal lymphadenopathy  Chief complaint/ Reason for visit-discuss PET CT scan results and further management  Heme/Onc history: Patient is a 85 year old female who underwent CT renal stone study for flank pain.  CT scan showed pathologically enlarged lymph nodes in the right lower quadrant measuring up to 2.6 cm.  Findings concerning for lymphoma or malignant process.  Patient's past medical history significant for pulmonary hypertension, CKD, hypothyroidism among other medical problems.  She did not have any B symptoms.  This was followed by a PET CT scan Which showed 3 prominent soft tissue nodule/lymph nodes one in the mesentery at 2.1 cm with an SUV of 2.8.  Adjacent soft tissue nodule/lymph node in the right iliac fossa measuring 3.1 cm and SUV of 2.8.  Predominantly fat attenuation mesenteric nodule in the left lower quadrant measuring 2 cm without any significant FDG uptake  Interval history-patient overall feels well at this time and denies any specific complaints  ECOG PS- 1 Pain scale- 0   Review of systems- Review of Systems  Constitutional: Negative for chills, fever, malaise/fatigue and weight loss.  HENT: Negative for congestion, ear discharge and nosebleeds.   Eyes: Negative for blurred vision.  Respiratory: Negative for cough, hemoptysis, sputum production, shortness of breath and wheezing.   Cardiovascular: Negative for chest pain, palpitations, orthopnea and claudication.  Gastrointestinal: Negative for abdominal pain, blood in stool, constipation, diarrhea, heartburn, melena, nausea and vomiting.  Genitourinary: Negative  for dysuria, flank pain, frequency, hematuria and urgency.  Musculoskeletal: Negative for back pain, joint pain and myalgias.  Skin: Negative for rash.  Neurological: Negative for dizziness, tingling, focal weakness, seizures, weakness and headaches.  Endo/Heme/Allergies: Does not bruise/bleed easily.  Psychiatric/Behavioral: Negative for depression and suicidal ideas. The patient does not have insomnia.       Allergies  Allergen Reactions   Augmentin [Amoxicillin-Pot Clavulanate] Itching    Has patient had a PCN reaction causing immediate rash, facial/tongue/throat swelling, SOB or lightheadedness with hypotension: No Has patient had a PCN reaction causing severe rash involving mucus membranes or skin necrosis: No Has patient had a PCN reaction that required hospitalization: No Has patient had a PCN reaction occurring within the last 10 years: No If all of the above answers are "NO", then may proceed with Cephalosporin use.      Past Medical History:  Diagnosis Date   Allergy    Back spasm    Bunion    Carpal tunnel syndrome    Cataracta    Diabetes mellitus without complication (HCC)    Generalized osteoarthritis    Gout    Hemorrhoids without complication    Hyperlipidemia    Hypertension    Hypothyroidism    Impingement syndrome of right shoulder    Insomnia    Lipoma    Lung nodule    Neuritis or radiculitis due to rupture of lumbar intervertebral disc    Obesity    Osteoporosis    PAF (paroxysmal atrial fibrillation) ([place])    a. 03/2018 AF w/ RVR-->converted spont.  CHA2DS2VASc = 7-->Eliquis.   Proteinuria    Rotator cuff tear    Systolic murmur    a. 04/2262 Echo: EF 60-65%, no  rwma, Gr1 DD, mild MR, mildly dil LA. Nl RV fxn. PASP 69mHg.   TIA (transient ischemic attack)    TIA (transient ischemic attack) 1998   small TIA.  no residual effects   Tinnitus of both ears    Unspecified glaucoma(365.9)    Vitamin D deficiency     Wedge compression fracture of t11-T12 vertebra, sequela      Past Surgical History:  Procedure Laterality Date   ABDOMINAL HYSTERECTOMY  1986   APPENDECTOMY     BREAST SURGERY Bilateral 1983   biopsy of each side, negative   CARPAL TUNNEL RELEASE     CATARACT EXTRACTION W/PHACO Left 09/03/2017   Procedure: CATARACT EXTRACTION PHACO AND INTRAOCULAR LENS PLACEMENT (IOC)-LEFT DIABETIC;  Surgeon: PBirder Robson MD;  Location: [facility] ORS;  Service: Ophthalmology;  Laterality: Left;  [place]:10.2 AP% 16.7 CDE 11.71 Fluid Pack Lot # [id number]  CATARACT EXTRACTION W/PHACO Right 09/30/2017   Procedure: CATARACT EXTRACTION PHACO AND INTRAOCULAR LENS PLACEMENT (IOC);  Surgeon: PBirder Robson MD;  Location: [facility] ORS;  Service: Ophthalmology;  Laterality: Right;  [place]:35.1 AP% 13.1 CDE 12.44 Fluid pack Lot # [id number]   COLON SURGERY     EYE SURGERY Left 09/03/2017   Cataract extraction   FEMUR FRACTURE SURGERY Left    FRACTURE SURGERY  2001   left femur   HEMORROIDECTOMY     polyp removed from vocal cord      Social History   Socioeconomic History   Marital status: Divorced    Spouse name: Not on file   Number of children: 6   Years of education: Not on file   Highest education level: 10th grade  Occupational History   Not on file  Social Needs   Financial resource strain: Somewhat hard   Food insecurity:    Worry: Never true    Inability: Never true   Transportation needs:    Medical: No    Non-medical: No  Tobacco Use   Smoking status: Never Smoker   Smokeless tobacco: Never Used  Substance and Sexual Activity   Alcohol use: No    Alcohol/week: 0.0 standard drinks   Drug use: No   Sexual activity: Not Currently  Lifestyle   Physical activity:    Days per week: 7 days    Minutes per session: 30 min   Stress: Not at all  Relationships   Social connections:    Talks on phone: More than three times a week    Gets together: Twice a week     Attends religious service: More than 4 times per year    Active member of club or organization: Yes    Attends meetings of clubs or organizations: 1 to 4 times per year    Relationship status: Divorced   Intimate partner violence:    Fear of current or ex partner: No    Emotionally abused: No    Physically abused: No    Forced sexual activity: No  Other Topics Concern   Not on file  Social History Narrative   Not on file    Family History  Problem Relation Age of Onset   Diabetes Mother    Hypertension Mother    Dementia Mother    Hypertension Son      Current Outpatient Medications:    apixaban (ELIQUIS) 5 MG TABS tablet, Take 1 tablet (5 mg total) by mouth 2 (two) times daily., Disp: 180 tablet, Rfl: 1   Blood Glucose Monitoring Suppl (ACCU-CHEK AVIVA  PLUS) w/Device KIT, 1 kit by Does not apply route 3 (three) times daily., Disp: 1 kit, Rfl: 0   carvedilol (COREG) 25 MG tablet, Take 1 tablet (25 mg total) by mouth 2 (two) times daily. New dose, Disp: 180 tablet, Rfl: 3   COMBIGAN 0.2-0.5 % ophthalmic solution, Place 1 drop into both eyes 2 (two) times daily., Disp: , Rfl:    Cyanocobalamin (B-12) 1000 MCG TABS, Take 1,000 mcg daily by mouth., Disp: , Rfl:    diclofenac sodium (VOLTAREN) 1 % GEL, Apply 4 g topically 4 (four) times daily. (Patient taking differently: Apply 4 g topically 4 (four) times daily as needed (for pain). ), Disp: 300 g, Rfl: 1   Dulaglutide (TRULICITY) 1.5 KK/9.3GH SOPN, Inject 1.5 mg into the skin every Thursday., Disp: 12 pen, Rfl: 1   empagliflozin (JARDIANCE) 25 MG TABS tablet, Take 25 mg by mouth daily., Disp: 90 tablet, Rfl: 1   glucose blood (ACCU-CHEK AVIVA PLUS) test strip, Check fasting glucose once each morning, and check glucose as needed., Disp: 100 each, Rfl: 12   Lancets (ACCU-CHEK MULTICLIX) lancets, Use as instructed, Disp: 204 each, Rfl: 12   levothyroxine (SYNTHROID, LEVOTHROID) 50 MCG tablet, Take 50 mcg by mouth  daily before breakfast. , Disp: , Rfl:    loratadine (CLARITIN) 10 MG tablet, TAKE 1 TABLET (10 MG TOTAL) BY MOUTH DAILY., Disp: 90 tablet, Rfl: 2   metFORMIN (GLUCOPHAGE) 850 MG tablet, Take 1 tablet (850 mg total) by mouth 2 (two) times daily., Disp: 180 tablet, Rfl: 1   nitrofurantoin, macrocrystal-monohydrate, (MACROBID) 100 MG capsule, Take 1 capsule (100 mg total) by mouth 2 (two) times daily., Disp: 10 capsule, Rfl: 0   olmesartan-hydrochlorothiazide (BENICAR HCT) 40-25 MG tablet, TAKE 1 TABLET BY MOUTH EVERY DAY, Disp: 90 tablet, Rfl: 0   pantoprazole (PROTONIX) 40 MG tablet, Take 1 tablet (40 mg total) by mouth daily., Disp: 30 tablet, Rfl: 0   pravastatin (PRAVACHOL) 40 MG tablet, TAKE 1 TABLET BY MOUTH EVERY DAY, Disp: 90 tablet, Rfl: 1   Travoprost, BAK Free, (TRAVATAN) 0.004 % SOLN ophthalmic solution, Place 1 drop into both eyes at bedtime., Disp: , Rfl:    Vitamin D, Ergocalciferol, (DRISDOL) 1.25 MG (50000 UT) CAPS capsule, Take weekly, Disp: 12 capsule, Rfl: 1   ondansetron (ZOFRAN) 4 MG tablet, Take 1 tablet (4 mg total) by mouth every 8 (eight) hours as needed for nausea or vomiting. (Patient not taking: Reported on 04/01/2019), Disp: 20 tablet, Rfl: 0  Physical exam:  Vitals:   04/12/19 1117  BP: 138/73  Pulse: 81  Temp: 97.6 F (36.4 C)  TempSrc: Tympanic  Weight: 214 lb 6.4 oz (97.3 kg)   Physical Exam HENT:     Head: Normocephalic and atraumatic.  Eyes:     Pupils: Pupils are equal, round, and reactive to light.  Neck:     Musculoskeletal: Normal range of motion.  Cardiovascular:     Rate and Rhythm: Normal rate and regular rhythm.     Heart sounds: Normal heart sounds.  Pulmonary:     Effort: Pulmonary effort is normal.     Breath sounds: Normal breath sounds.  Abdominal:     General: Bowel sounds are normal.     Palpations: Abdomen is soft.  Skin:    General: Skin is warm and dry.  Neurological:     Mental Status: She is alert and oriented to  person, place, and time.      CMP Latest Ref Rng &  Units 04/01/2019  Glucose 70 - 99 mg/dL 154(H)  BUN 8 - 23 mg/dL 13  Creatinine 0.44 - 1.00 mg/dL 1.15(H)  Sodium 135 - 145 mmol/L 141  Potassium 3.5 - 5.1 mmol/L 3.8  Chloride 98 - 111 mmol/L 105  CO2 22 - 32 mmol/L 26  Calcium 8.9 - 10.3 mg/dL 9.6  Total Protein 6.5 - 8.1 g/dL 7.4  Total Bilirubin 0.3 - 1.2 mg/dL 0.4  Alkaline Phos 38 - 126 U/L 55  AST 15 - 41 U/L 15  ALT 0 - 44 U/L 9   CBC Latest Ref Rng & Units 04/01/2019  WBC 4.0 - 10.5 K/uL 9.8  Hemoglobin 12.0 - 15.0 g/dL 12.0  Hematocrit 36.0 - 46.0 % 39.5  Platelets 150 - 400 K/uL 321    No images are attached to the encounter.  Nm Pet Image Initial (pi) Skull Base To Thigh  Result Date: 04/08/2019 CLINICAL DATA:  Initial treatment strategy for abdominal lymphadenopathy. EXAM: NUCLEAR MEDICINE PET SKULL BASE TO THIGH TECHNIQUE: 10.8 mCi F-18 FDG was injected intravenously. Full-ring PET imaging was performed from the skull base to thigh after the radiotracer. CT data was obtained and used for attenuation correction and anatomic localization. Fasting blood glucose: 149 mg/dl COMPARISON:  03/26/2019 FINDINGS: Mediastinal blood pool activity: SUV max 3.9 Liver activity: SUV max 4.29 NECK: No hypermetabolic lymph nodes in the neck. Incidental CT findings: none CHEST: No hypermetabolic mediastinal or hilar nodes. No suspicious pulmonary nodules on the CT scan. Tiny right upper lobe lung nodule measures 5 mm and is too small to characterize. Subpleural nodule within the lateral left upper lobe measures 5 mm, image 99/3. Incidental CT findings: Mild aortic atherosclerosis. Calcification within the LAD coronary artery noted. ABDOMEN/PELVIS: No abnormal radiotracer activity within the liver, pancreas, or spleen. The adrenal glands appear normal. No enlarged or hypermetabolic retroperitoneal lymph nodes. No hypermetabolic pelvic or inguinal lymph nodes. The solid-appearing soft tissue  nodule or lymph node within the mesentery is again noted measuring 2.1 cm. This is mildly FDG avid with SUV max of 2.8. Adjacent well-circumscribed ovoid soft tissue nodule or lymph node in the right iliac fossa measures 3.1 cm and has an SUV max of 2.8. The predominantly fat attenuation mesenteric nodule in the left lower quadrant measures 2 cm without significant FDG uptake. Incidental CT findings: Aortic atherosclerosis.  No aneurysm. SKELETON: No focal hypermetabolic activity to suggest skeletal metastasis. Incidental CT findings: none IMPRESSION: 1. The soft tissue lesions of concern within the right lower quadrant of the abdomen are again noted in do not appear significantly changed from recent CT of the abdomen. These show only mild FDG uptake less than blood pool and liver activity. These are indeterminate and may represent either benign lesions or low-grade malignancy such as carcinoid. 2. Aortic Atherosclerosis (ICD10-I70.0). Coronary artery calcification. Electronically Signed   By: Kerby Moors M.D.   On: 04/08/2019 10:14   Ct Renal Stone Study  Result Date: 03/26/2019 CLINICAL DATA:  Right-sided flank pain x1 week. The patient has a history of prior partial hysterectomy. EXAM: CT ABDOMEN AND PELVIS WITHOUT CONTRAST TECHNIQUE: Multidetector CT imaging of the abdomen and pelvis was performed following the standard protocol without IV contrast. COMPARISON:  None. FINDINGS: Lower chest: The heart size is enlarged. Thoracic aortic calcifications are noted. There is a stable peripheral pulmonary nodule at the right lung base. No further follow-up is required for this. Hepatobiliary: No focal liver abnormality is seen. There is a slight geographic area of increased  attenuation in hepatic segment 3/4 favored to be artifact. No gallstones, gallbladder wall thickening, or biliary dilatation. Pancreas: Unremarkable. No pancreatic ductal dilatation or surrounding inflammatory changes. Spleen: Normal in size  without focal abnormality. Adrenals/Urinary Tract: Adrenal glands are unremarkable. Kidneys are normal, without renal calculi, focal lesion, or hydronephrosis. Bladder is unremarkable. Stomach/Bowel: Stomach is within normal limits. The appendix is not reliably identified, however there are no significant inflammatory changes he in the right lower quadrant. No evidence of bowel wall thickening, distention, or inflammatory changes. Vascular/Lymphatic: Aortic calcifications are noted. There are no pathologically enlarged retroperitoneal lymph nodes. There are multiple pathologically enlarged lymph nodes in the right lower quadrant measuring up to 2.6 cm (axial series 2, image 55). There is an additional enlarged lymph node in the right lower quadrant measuring 2.2 cm (axial series 2, image 53). Reproductive: Status post hysterectomy. No adnexal masses. Other: No abdominal wall hernia or abnormality. No abdominopelvic ascites. There is a small fat containing 1.8 cm lesion in the omentum of the low anterior abdomen (axial series 2, image 58). This is likely benign given its fat content. Musculoskeletal: No acute or significant osseous findings. IMPRESSION: 1. No acute intra-abdominal abnormality detected. No hydronephrosis. No radiopaque obstructing kidney stones. 2. Pathologically enlarged lymph nodes in the right lower quadrant as detailed above, measuring up to 2.6 cm. Findings are concerning for lymphoma or other malignant process. Follow-up is recommended. 3. The appendix is not reliably identified, however there are no significant inflammatory changes in the right lower quadrant. 4. Mild cardiomegaly. Aortic Atherosclerosis (ICD10-I70.0). These results will be called to the ordering clinician or representative by the Radiologist Assistant, and communication documented in the PACS or zVision Dashboard. Electronically Signed   By: Constance Holster M.D.   On: 03/26/2019 15:50     Assessment and plan- Patient is  a 85 y.o. female with intra-abdominal adenopathy/mesenteric nodule of unclear etiology  I have reviewed PET/CT scan images independently and discussed findings with the patient and her daughter.  PET CT scan again shows 3 distinct mesenteric nodules/lymph nodes ranging from 2 to 3 cm.  However the SUV uptake is low at 2.8.  I also discussed her PET CT scan findings with Dr. Kathlene Cote last week.  These nodules are deep-seated in close relation to her bowel.  CT-guided biopsy may be feasible but does carry a risk of potential bowel injury.  The other approach to the lymph node biopsy would be laparoscopy based biopsy.  These lymph nodes have relatively low SUV uptake and it is unclear if there represent a benign process versus an indolent process such as carcinoid.  We do not have any prior CT scans for comparison.  I will discuss her case at tumor board this week to get opinion both from interventional radiology as well as surgery to see if lymph node biopsy would be possible in a safe way.  If there are significant risks involved with the biopsy as a potential option would be a repeat CT scan in 3 months time to see if these lymph nodes are growing.  Patient is herself leaning towards a CT scan in 3 months if biopsy cannot be done safely.   I will call the patient with the consensus from tumor board and arrange for follow-up accordingly   Visit Diagnosis 1. Lymphadenopathy   2. Abnormal gastrointestinal PET scan      Dr. Randa Evens, MD, MPH [facility] at [facility] [phone] 04/12/2019 12:29 PM

## 2019-04-13 ENCOUNTER — Telehealth: Payer: Self-pay

## 2019-04-13 NOTE — Telephone Encounter (Signed)
Copied from [place] [id number]. Topic: General - Call Back - No Documentation >> Apr 13, 2019 11:04 AM Erick Blinks wrote: Reason for CRM: Nevin Bloodgood from [place] calling to verify if we have received fax from them regarding prescription forms. Free style libra 14 day diabetes  [phone]  Fax: [phone]   Please disregard in paperwork that maybe in your folder in regards to this. I contacted the patient and she did not request this service.

## 2019-04-13 NOTE — Telephone Encounter (Signed)
Pt states she will call back to reschedule at a later date

## 2019-04-15 ENCOUNTER — Other Ambulatory Visit: Payer: Medicare Other

## 2019-04-15 ENCOUNTER — Other Ambulatory Visit: Payer: Self-pay | Admitting: *Deleted

## 2019-04-15 DIAGNOSIS — R948 Abnormal results of function studies of other organs and systems: Secondary | ICD-10-CM

## 2019-04-15 DIAGNOSIS — R591 Generalized enlarged lymph nodes: Secondary | ICD-10-CM

## 2019-04-15 NOTE — Progress Notes (Signed)
Tumor Board Documentation  Susan Bowen was presented by Dr Janese Banks at our Tumor Board on 04/15/2019, which included representatives from medical oncology, radiation oncology, pathology, radiology, surgical, surgical oncology, navigation, internal medicine, research, palliative care.  Susan Bowen currently presents as a new patient, for discussion with history of the following treatments: active survellience.  Additionally, we reviewed previous medical and familial history, history of present illness, and recent lab results along with all available histopathologic and imaging studies. The tumor board considered available treatment options and made the following recommendations: Additional screening, Biopsy Dotatate PET, if negative, then laparoscopic surgical biopsy  The following procedures/referrals were also placed: No orders of the defined types were placed in this encounter.   Clinical Trial Status: not discussed   Staging used: To be determined  National site-specific guidelines   were discussed with respect to the case.  Tumor board is a meeting of clinicians from various specialty areas who evaluate and discuss patients for whom a multidisciplinary approach is being considered. Final determinations in the plan of care are those of the provider(s). The responsibility for follow up of recommendations given during tumor board is that of the provider.   Today's extended care, comprehensive team conference, Susan Bowen was not present for the discussion and was not examined.   Multidisciplinary Tumor Board is a multidisciplinary case peer review process.  Decisions discussed in the Multidisciplinary Tumor Board reflect the opinions of the specialists present at the conference without having examined the patient.  Ultimately, treatment and diagnostic decisions rest with the primary provider(s) and the patient.

## 2019-04-17 ENCOUNTER — Other Ambulatory Visit: Payer: Self-pay | Admitting: Family Medicine

## 2019-04-17 DIAGNOSIS — R140 Abdominal distension (gaseous): Secondary | ICD-10-CM

## 2019-04-17 DIAGNOSIS — R1012 Left upper quadrant pain: Secondary | ICD-10-CM

## 2019-04-21 ENCOUNTER — Ambulatory Visit: Admission: RE | Admit: 2019-04-21 | Payer: Medicare Other | Source: Home / Self Care | Admitting: Gastroenterology

## 2019-04-21 ENCOUNTER — Telehealth: Payer: Self-pay | Admitting: *Deleted

## 2019-04-21 ENCOUNTER — Encounter: Admission: RE | Payer: Self-pay | Source: Home / Self Care

## 2019-04-21 SURGERY — ESOPHAGOGASTRODUODENOSCOPY (EGD) WITH PROPOFOL
Anesthesia: Choice

## 2019-04-21 NOTE — Telephone Encounter (Signed)
I called patient to let her know that Dr. Janese Banks had wanted patient to have a special scan that we will try to figure out if patient has a carcinoid.  I explained to patient that on her PET scan that she originally had it could be a benign process or it could be that it might be a carcinoid which is a type of tumor.  There is a special PET scan called in [place] that specifically looks for carcinoid and there is a special contrast helps to see it better and so therefore the test Dr. Janese Banks would like her to have.  We have scheduled it for 6/25 at 1030 but patient would have to arrive at the medical mall at 10 AM.  1 to 2 days prior to the actual PET scan the nuclear med staff will call patient and go over all the instructions in order to get the scan done.  Patient is agreeable to this and has the date and time.  Also after the scan is done and resulted out Dr. Janese Banks is to call patient and let her know about the results.

## 2019-04-29 ENCOUNTER — Encounter
Admission: RE | Admit: 2019-04-29 | Discharge: 2019-04-29 | Disposition: A | Discharge status: Home / Self Care | Payer: Medicare Other | Source: Ambulatory Visit | Attending: Oncology | Admitting: Oncology

## 2019-04-29 ENCOUNTER — Other Ambulatory Visit: Payer: Self-pay

## 2019-04-29 DIAGNOSIS — R948 Abnormal results of function studies of other organs and systems: Secondary | ICD-10-CM | POA: Insufficient documentation

## 2019-04-29 DIAGNOSIS — R591 Generalized enlarged lymph nodes: Secondary | ICD-10-CM | POA: Insufficient documentation

## 2019-04-29 DIAGNOSIS — C7A1 Malignant poorly differentiated neuroendocrine tumors: Secondary | ICD-10-CM | POA: Diagnosis not present

## 2019-04-29 MED ORDER — GALLIUM GA 68 DOTATATE IV KIT
5.2500 | PACK | Freq: Once | INTRAVENOUS | Status: AC | PRN
  Administered 2019-04-29: 5.55 via INTRAVENOUS

## 2019-05-03 ENCOUNTER — Ambulatory Visit: Payer: Medicare Other | Admitting: Family Medicine

## 2019-05-03 ENCOUNTER — Inpatient Hospital Stay: Payer: Medicare Other | Admitting: Oncology

## 2019-05-03 ENCOUNTER — Encounter: Payer: Self-pay | Admitting: Oncology

## 2019-05-03 NOTE — Progress Notes (Unsigned)
Checked patient in for virtual visit with Dr. Janese Banks. No concerns today.

## 2019-05-04 ENCOUNTER — Inpatient Hospital Stay: Payer: Medicare Other | Admitting: Oncology

## 2019-05-06 ENCOUNTER — Other Ambulatory Visit: Payer: Self-pay

## 2019-05-06 ENCOUNTER — Inpatient Hospital Stay: Payer: Medicare Other

## 2019-05-06 ENCOUNTER — Inpatient Hospital Stay: Payer: Medicare Other | Attending: Oncology | Admitting: Oncology

## 2019-05-06 ENCOUNTER — Encounter: Payer: Self-pay | Admitting: Oncology

## 2019-05-06 VITALS — BP 145/81 | HR 66 | Temp 96.9°F | Resp 18 | Wt 210.8 lb

## 2019-05-06 DIAGNOSIS — R591 Generalized enlarged lymph nodes: Secondary | ICD-10-CM

## 2019-05-06 DIAGNOSIS — R5383 Other fatigue: Secondary | ICD-10-CM | POA: Insufficient documentation

## 2019-05-06 DIAGNOSIS — Z79899 Other long term (current) drug therapy: Secondary | ICD-10-CM

## 2019-05-06 DIAGNOSIS — C7A00 Malignant carcinoid tumor of unspecified site: Secondary | ICD-10-CM | POA: Insufficient documentation

## 2019-05-06 DIAGNOSIS — I10 Essential (primary) hypertension: Secondary | ICD-10-CM | POA: Diagnosis not present

## 2019-05-06 DIAGNOSIS — R197 Diarrhea, unspecified: Secondary | ICD-10-CM

## 2019-05-06 DIAGNOSIS — E559 Vitamin D deficiency, unspecified: Secondary | ICD-10-CM | POA: Insufficient documentation

## 2019-05-06 DIAGNOSIS — R011 Cardiac murmur, unspecified: Secondary | ICD-10-CM | POA: Diagnosis not present

## 2019-05-06 DIAGNOSIS — E669 Obesity, unspecified: Secondary | ICD-10-CM | POA: Diagnosis not present

## 2019-05-06 DIAGNOSIS — C7B04 Secondary carcinoid tumors of peritoneum: Secondary | ICD-10-CM | POA: Diagnosis not present

## 2019-05-06 DIAGNOSIS — Z7901 Long term (current) use of anticoagulants: Secondary | ICD-10-CM

## 2019-05-06 DIAGNOSIS — E039 Hypothyroidism, unspecified: Secondary | ICD-10-CM

## 2019-05-06 DIAGNOSIS — E119 Type 2 diabetes mellitus without complications: Secondary | ICD-10-CM | POA: Diagnosis not present

## 2019-05-06 DIAGNOSIS — Z8673 Personal history of transient ischemic attack (TIA), and cerebral infarction without residual deficits: Secondary | ICD-10-CM | POA: Diagnosis not present

## 2019-05-06 DIAGNOSIS — R918 Other nonspecific abnormal finding of lung field: Secondary | ICD-10-CM | POA: Diagnosis not present

## 2019-05-06 DIAGNOSIS — E785 Hyperlipidemia, unspecified: Secondary | ICD-10-CM | POA: Diagnosis not present

## 2019-05-06 DIAGNOSIS — M810 Age-related osteoporosis without current pathological fracture: Secondary | ICD-10-CM | POA: Diagnosis not present

## 2019-05-06 DIAGNOSIS — G4700 Insomnia, unspecified: Secondary | ICD-10-CM | POA: Insufficient documentation

## 2019-05-06 DIAGNOSIS — Z7984 Long term (current) use of oral hypoglycemic drugs: Secondary | ICD-10-CM | POA: Insufficient documentation

## 2019-05-06 DIAGNOSIS — C7B00 Secondary carcinoid tumors, unspecified site: Secondary | ICD-10-CM

## 2019-05-06 DIAGNOSIS — R531 Weakness: Secondary | ICD-10-CM | POA: Diagnosis not present

## 2019-05-06 NOTE — Progress Notes (Signed)
Patient here for follow up. No concerns voiced.  °

## 2019-05-08 ENCOUNTER — Other Ambulatory Visit: Payer: Self-pay | Admitting: Family Medicine

## 2019-05-08 DIAGNOSIS — E785 Hyperlipidemia, unspecified: Secondary | ICD-10-CM

## 2019-05-09 NOTE — Progress Notes (Signed)
Hematology/Oncology Consult note [facility]  Telephone:([phone] [phone]  Patient Care Team: Steele Sizer, MD as PCP - General (Family Medicine) Wellington Hampshire, MD as PCP - Cardiology (Cardiology)   Name of the patient: Susan Bowen  [id number]  1932-04-07   Date of visit: 05/09/19  Diagnosis- intra abdominal adenopathy- metastatic carcinoid  Chief complaint/ Reason for visit- discuss dotatate scan results and further mnagement  Heme/Onc history: Patient is a 85 year old female who underwent CT renal stone study for flank pain.  CT scan showed pathologically enlarged lymph nodes in the right lower quadrant measuring up to 2.6 cm.  Findings concerning for lymphoma or malignant process.  Patient's past medical history significant for pulmonary hypertension, CKD, hypothyroidism among other medical problems.  She did not have any B symptoms.  This was followed by a PET CT scan Which showed 3 prominent soft tissue nodule/lymph nodes one in the mesentery at 2.1 cm with an SUV of 2.8.  Adjacent soft tissue nodule/lymph node in the right iliac fossa measuring 3.1 cm and SUV of 2.8.  Predominantly fat attenuation mesenteric nodule in the left lower quadrant measuring 2 cm without any significant FDG uptake   Interval history- she is doing well. Denies any abdominal pain. She reports some diarrhea over the last 1 week.  ECOG PS- 1 Pain scale- 0   Review of systems- Review of Systems  Constitutional: Positive for malaise/fatigue. Negative for chills, fever and weight loss.  HENT: Negative for congestion, ear discharge and nosebleeds.   Eyes: Negative for blurred vision.  Respiratory: Negative for cough, hemoptysis, sputum production, shortness of breath and wheezing.   Cardiovascular: Negative for chest pain, palpitations, orthopnea and claudication.  Gastrointestinal: Negative for abdominal pain, blood in stool, constipation, diarrhea,  heartburn, melena, nausea and vomiting.  Genitourinary: Negative for dysuria, flank pain, frequency, hematuria and urgency.  Musculoskeletal: Negative for back pain, joint pain and myalgias.  Skin: Negative for rash.  Neurological: Negative for dizziness, tingling, focal weakness, seizures, weakness and headaches.  Endo/Heme/Allergies: Does not bruise/bleed easily.  Psychiatric/Behavioral: Negative for depression and suicidal ideas. The patient does not have insomnia.       Allergies  Allergen Reactions   Augmentin [Amoxicillin-Pot Clavulanate] Itching    Has patient had a PCN reaction causing immediate rash, facial/tongue/throat swelling, SOB or lightheadedness with hypotension: No Has patient had a PCN reaction causing severe rash involving mucus membranes or skin necrosis: No Has patient had a PCN reaction that required hospitalization: No Has patient had a PCN reaction occurring within the last 10 years: No If all of the above answers are "NO", then may proceed with Cephalosporin use.      Past Medical History:  Diagnosis Date   Allergy    Back spasm    Bunion    Carpal tunnel syndrome    Cataracta    Diabetes mellitus without complication (HCC)    Generalized osteoarthritis    Gout    Hemorrhoids without complication    Hyperlipidemia    Hypertension    Hypothyroidism    Impingement syndrome of right shoulder    Insomnia    Lipoma    Lung nodule    Neuritis or radiculitis due to rupture of lumbar intervertebral disc    Obesity    Osteoporosis    PAF (paroxysmal atrial fibrillation) ([place])    a. 03/2018 AF w/ RVR-->converted spont.  CHA2DS2VASc = 7-->Eliquis.   Proteinuria    Rotator cuff tear  Systolic murmur    a. 06/2992 Echo: EF 60-65%, no rwma, Gr1 DD, mild MR, mildly dil LA. Nl RV fxn. PASP 32mHg.   TIA (transient ischemic attack)    TIA (transient ischemic attack) 1998   small TIA.  no residual effects   Tinnitus of both ears      Unspecified glaucoma(365.9)    Vitamin D deficiency    Wedge compression fracture of t11-T12 vertebra, sequela      Past Surgical History:  Procedure Laterality Date   ABDOMINAL HYSTERECTOMY  1986   APPENDECTOMY     BREAST SURGERY Bilateral 1983   biopsy of each side, negative   CARPAL TUNNEL RELEASE     CATARACT EXTRACTION W/PHACO Left 09/03/2017   Procedure: CATARACT EXTRACTION PHACO AND INTRAOCULAR LENS PLACEMENT (IOC)-LEFT DIABETIC;  Surgeon: PBirder Robson MD;  Location: [facility] ORS;  Service: Ophthalmology;  Laterality: Left;  [place]:10.2 AP% 16.7 CDE 11.71 Fluid Pack Lot # [id number]  CATARACT EXTRACTION W/PHACO Right 09/30/2017   Procedure: CATARACT EXTRACTION PHACO AND INTRAOCULAR LENS PLACEMENT (IOC);  Surgeon: PBirder Robson MD;  Location: [facility] ORS;  Service: Ophthalmology;  Laterality: Right;  [place]:35.1 AP% 13.1 CDE 12.44 Fluid pack Lot # [id number]   COLON SURGERY     EYE SURGERY Left 09/03/2017   Cataract extraction   FEMUR FRACTURE SURGERY Left    FRACTURE SURGERY  2001   left femur   HEMORROIDECTOMY     polyp removed from vocal cord      Social History   Socioeconomic History   Marital status: Divorced    Spouse name: Not on file   Number of children: 6   Years of education: Not on file   Highest education level: 10th grade  Occupational History   Not on file  Social Needs   Financial resource strain: Somewhat hard   Food insecurity    Worry: Never true    Inability: Never true   Transportation needs    Medical: No    Non-medical: No  Tobacco Use   Smoking status: Never Smoker   Smokeless tobacco: Never Used  Substance and Sexual Activity   Alcohol use: No    Alcohol/week: 0.0 standard drinks   Drug use: No   Sexual activity: Not Currently  Lifestyle   Physical activity    Days per week: 7 days    Minutes per session: 30 min   Stress: Not at all  Relationships   Social connections    Talks on phone:  More than three times a week    Gets together: Twice a week    Attends religious service: More than 4 times per year    Active member of club or organization: Yes    Attends meetings of clubs or organizations: 1 to 4 times per year    Relationship status: Divorced   Intimate partner violence    Fear of current or ex partner: No    Emotionally abused: No    Physically abused: No    Forced sexual activity: No  Other Topics Concern   Not on file  Social History Narrative   Not on file    Family History  Problem Relation Age of Onset   Diabetes Mother    Hypertension Mother    Dementia Mother    Hypertension Son      Current Outpatient Medications:    apixaban (ELIQUIS) 5 MG TABS tablet, Take 1 tablet (5 mg total) by mouth 2 (two) times daily., Disp:  180 tablet, Rfl: 1   Blood Glucose Monitoring Suppl (ACCU-CHEK AVIVA PLUS) w/Device KIT, 1 kit by Does not apply route 3 (three) times daily., Disp: 1 kit, Rfl: 0   carvedilol (COREG) 25 MG tablet, Take 1 tablet (25 mg total) by mouth 2 (two) times daily. New dose, Disp: 180 tablet, Rfl: 3   COMBIGAN 0.2-0.5 % ophthalmic solution, Place 1 drop into both eyes 2 (two) times daily., Disp: , Rfl:    Cyanocobalamin (B-12) 1000 MCG TABS, Take 1,000 mcg daily by mouth., Disp: , Rfl:    Dulaglutide (TRULICITY) 1.5 WI/2.0BT SOPN, Inject 1.5 mg into the skin every Thursday., Disp: 12 pen, Rfl: 1   empagliflozin (JARDIANCE) 25 MG TABS tablet, Take 25 mg by mouth daily., Disp: 90 tablet, Rfl: 1   glucose blood (ACCU-CHEK AVIVA PLUS) test strip, Check fasting glucose once each morning, and check glucose as needed., Disp: 100 each, Rfl: 12   Lancets (ACCU-CHEK MULTICLIX) lancets, Use as instructed, Disp: 204 each, Rfl: 12   levothyroxine (SYNTHROID, LEVOTHROID) 50 MCG tablet, Take 50 mcg by mouth daily before breakfast. , Disp: , Rfl:    loratadine (CLARITIN) 10 MG tablet, TAKE 1 TABLET (10 MG TOTAL) BY MOUTH DAILY., Disp: 90 tablet,  Rfl: 2   metFORMIN (GLUCOPHAGE) 850 MG tablet, Take 1 tablet (850 mg total) by mouth 2 (two) times daily., Disp: 180 tablet, Rfl: 1   olmesartan-hydrochlorothiazide (BENICAR HCT) 40-25 MG tablet, TAKE 1 TABLET BY MOUTH EVERY DAY, Disp: 90 tablet, Rfl: 0   pravastatin (PRAVACHOL) 40 MG tablet, TAKE 1 TABLET BY MOUTH EVERY DAY, Disp: 90 tablet, Rfl: 1   Travoprost, BAK Free, (TRAVATAN) 0.004 % SOLN ophthalmic solution, Place 1 drop into both eyes at bedtime., Disp: , Rfl:    Vitamin D, Ergocalciferol, (DRISDOL) 1.25 MG (50000 UT) CAPS capsule, Take weekly, Disp: 12 capsule, Rfl: 1   ondansetron (ZOFRAN) 4 MG tablet, Take 1 tablet (4 mg total) by mouth every 8 (eight) hours as needed for nausea or vomiting. (Patient not taking: Reported on 04/01/2019), Disp: 20 tablet, Rfl: 0  Physical exam:  Vitals:   05/06/19 1108  BP: (!) 145/81  Pulse: 66  Resp: 18  Temp: (!) 96.9 F (36.1 C)  Weight: 210 lb 12.8 oz (95.6 kg)   Physical Exam Constitutional:      General: She is not in acute distress.    Appearance: She is obese.  HENT:     Head: Normocephalic and atraumatic.  Eyes:     Pupils: Pupils are equal, round, and reactive to light.  Neck:     Musculoskeletal: Normal range of motion.  Cardiovascular:     Rate and Rhythm: Normal rate and regular rhythm.     Heart sounds: Normal heart sounds.  Pulmonary:     Effort: Pulmonary effort is normal.     Breath sounds: Normal breath sounds.  Abdominal:     General: Bowel sounds are normal.     Palpations: Abdomen is soft.  Skin:    General: Skin is warm and dry.  Neurological:     Mental Status: She is alert and oriented to person, place, and time.      CMP Latest Ref Rng & Units 04/01/2019  Glucose 70 - 99 mg/dL 154(H)  BUN 8 - 23 mg/dL 13  Creatinine 0.44 - 1.00 mg/dL 1.15(H)  Sodium 135 - 145 mmol/L 141  Potassium 3.5 - 5.1 mmol/L 3.8  Chloride 98 - 111 mmol/L 105  CO2 22 -  32 mmol/L 26  Calcium 8.9 - 10.3 mg/dL 9.6  Total  Protein 6.5 - 8.1 g/dL 7.4  Total Bilirubin 0.3 - 1.2 mg/dL 0.4  Alkaline Phos 38 - 126 U/L 55  AST 15 - 41 U/L 15  ALT 0 - 44 U/L 9   CBC Latest Ref Rng & Units 04/01/2019  WBC 4.0 - 10.5 K/uL 9.8  Hemoglobin 12.0 - 15.0 g/dL 12.0  Hematocrit 36.0 - 46.0 % 39.5  Platelets 150 - 400 K/uL 321    No images are attached to the encounter.  Nm Pet (netspot Ga 68 Dotatate) Skull Base To Mid Thigh  Result Date: 04/29/2019 CLINICAL DATA:  Subsequent treatment strategy for well differentiated neuroendocrine tumor. Low-grade carcinoid. EXAM: NUCLEAR MEDICINE PET SKULL BASE TO THIGH TECHNIQUE: 5.6 mCi Ga 14 DOTATATE was injected intravenously. Full-ring PET imaging was performed from the skull base to thigh after the radiotracer. CT data was obtained and used for attenuation correction and anatomic localization. COMPARISON:  FDG PET-CT 04/07/2019 FINDINGS: NECK No radiotracer activity in neck lymph nodes. Incidental CT findings: None CHEST No radiotracer accumulation within mediastinal or hilar lymph nodes. No suspicious pulmonary nodules on the CT scan. Incidental CT finding:None ABDOMEN/PELVIS Within the RIGHT lower quadrant small bowel mesentery there are 2 adjacent ovoid soft tissue masses measuring 2.9 and 2.4 cm (image CT 188/3). These adjacent lesions have intense radiotracer activity with SUV max equal 44. These soft tissue mesenteric nodules are not changed in size from recent FDG PET-CT scan. No additional foci of abnormal radiotracer activity identified. No foci within the bowel. No clear abnormal activity in the liver. There is some heterogeneity of uptake in the liver with several foci of increased uptake but no CT correlation. These foci are very small. No IV contrast. Physiologic activity noted in the liver, spleen, adrenal glands and kidneys. Incidental CT findings:None SKELETON No focal activity to suggest skeletal metastasis. Incidental CT findings:None IMPRESSION: 1. Two ovoid nodules within  the RIGHT lower quadrant small bowel mesentery with intense radiotracer activity consistent with well differentiated neuroendocrine tumor metastasis 2. No primary lesion identified the bowel. 3. No clear evidence of liver metastasis. Several small foci of uptake within the liver. Consider contrast enhanced CT or MRI at some point. Electronically Signed   By: Suzy Bouchard M.D.   On: 04/29/2019 14:15     Assessment and plan- Patient is a 85 y.o. female with incidentally found intra abdominal adenopathy here to discuss results of dotatate scan.   Patient was found to have intra-abdominal non-bulky adenopathy incidentally when she had work-up done for possible renal stone.  The location of the lymph nodes was deep close to the mesentery and was not possible to safely biopsy his lymph nodes.  We did obtain a dotatate scan and I discussed the results with the patient and her niece today.  Dotatate scan shows 2 ovoid nodules in the right lower quadrant with intense radioactivity consistent with a well-differentiated neuroendocrine tumor metastases.  No evidence of liver metastases or primary lesion seen in the bowel.  Given that these lymph nodes are small and she has low-volume asymptomatic disease I would not recommend putting her on octreotide/lanreotide at this time.  I will check a chromogranin A level today and also obtain 24-hour 5-HIAA quantitative urine.  I will see her back in 4 months time with CT chest abdomen and pelvis with contrast and repeat a CBC with differential, CMP, chromogranin A and 24-hour 5-HIAA urine at that time.  Discussed natural course of well-differentiated neuroendocrine tumors which are in general slow-growing and did not need treatment unless they cause symptoms such as flushing, palpitations abdominal pain cramping and diarrhea.  Octreotide lanreotide also does not reduce the tumor size as much and only helps with symptomatic control especially in non-bulky disease.  Patient  has been having some acute episode of diarrhea which I do not think is related to her neuroendocrine tumor.  If patient can give [place] a sample we will be obtaining C. difficile and GI intestinal panel PCR a patient knows to call us if her diarrhea does not get better   Visit Diagnosis 1. Lymphadenopathy   2. Metastatic carcinoid tumor [facility])      Dr. Randa Evens, MD, MPH [facility] at [facility] [phone] 05/09/2019 1:54 PM

## 2019-05-10 ENCOUNTER — Encounter: Payer: Self-pay | Admitting: Family Medicine

## 2019-05-10 ENCOUNTER — Other Ambulatory Visit: Payer: Self-pay

## 2019-05-10 ENCOUNTER — Ambulatory Visit (INDEPENDENT_AMBULATORY_CARE_PROVIDER_SITE_OTHER): Payer: Medicare Other | Admitting: Family Medicine

## 2019-05-10 VITALS — BP 120/84 | HR 95 | Temp 97.3°F | Resp 16 | Ht 63.0 in | Wt 210.5 lb

## 2019-05-10 DIAGNOSIS — M109 Gout, unspecified: Secondary | ICD-10-CM

## 2019-05-10 DIAGNOSIS — R591 Generalized enlarged lymph nodes: Secondary | ICD-10-CM

## 2019-05-10 DIAGNOSIS — E119 Type 2 diabetes mellitus without complications: Secondary | ICD-10-CM | POA: Diagnosis not present

## 2019-05-10 DIAGNOSIS — E785 Hyperlipidemia, unspecified: Secondary | ICD-10-CM

## 2019-05-10 DIAGNOSIS — R197 Diarrhea, unspecified: Secondary | ICD-10-CM

## 2019-05-10 DIAGNOSIS — I480 Paroxysmal atrial fibrillation: Secondary | ICD-10-CM | POA: Diagnosis not present

## 2019-05-10 DIAGNOSIS — N183 Chronic kidney disease, stage 3 unspecified: Secondary | ICD-10-CM

## 2019-05-10 DIAGNOSIS — R5383 Other fatigue: Secondary | ICD-10-CM | POA: Diagnosis not present

## 2019-05-10 DIAGNOSIS — C7B04 Secondary carcinoid tumors of peritoneum: Secondary | ICD-10-CM | POA: Diagnosis not present

## 2019-05-10 DIAGNOSIS — F5101 Primary insomnia: Secondary | ICD-10-CM | POA: Diagnosis not present

## 2019-05-10 DIAGNOSIS — R531 Weakness: Secondary | ICD-10-CM | POA: Diagnosis not present

## 2019-05-10 DIAGNOSIS — I700 Atherosclerosis of aorta: Secondary | ICD-10-CM | POA: Diagnosis not present

## 2019-05-10 DIAGNOSIS — I10 Essential (primary) hypertension: Secondary | ICD-10-CM

## 2019-05-10 DIAGNOSIS — E1121 Type 2 diabetes mellitus with diabetic nephropathy: Secondary | ICD-10-CM | POA: Diagnosis not present

## 2019-05-10 DIAGNOSIS — E1169 Type 2 diabetes mellitus with other specified complication: Secondary | ICD-10-CM | POA: Diagnosis not present

## 2019-05-10 DIAGNOSIS — C7A00 Malignant carcinoid tumor of unspecified site: Secondary | ICD-10-CM | POA: Diagnosis not present

## 2019-05-10 LAB — POCT GLYCOSYLATED HEMOGLOBIN (HGB A1C): Hemoglobin A1C: 7.8 % — AB (ref 4.0–5.6)

## 2019-05-10 LAB — GASTROINTESTINAL PANEL BY PCR, STOOL (REPLACES STOOL CULTURE)

## 2019-05-10 LAB — C DIFFICILE QUICK SCREEN W PCR REFLEX
C Diff antigen: NEGATIVE
C Diff interpretation: NOT DETECTED
C Diff toxin: NEGATIVE

## 2019-05-10 LAB — CHROMOGRANIN A: Chromogranin A (ng/mL): 240.4 ng/mL — ABNORMAL HIGH (ref 0.0–101.8)

## 2019-05-10 MED ORDER — JARDIANCE 25 MG PO TABS: 25.0000 mg | ORAL_TABLET | Freq: Every day | ORAL | 1 refills | Status: DC

## 2019-05-10 MED ORDER — COLCHICINE 0.6 MG PO TABS: 0.6000 mg | ORAL_TABLET | Freq: Every day | ORAL | 0 refills | Status: DC | PRN

## 2019-05-10 MED ORDER — TRULICITY 1.5 MG/0.5ML ~~LOC~~ SOAJ: 1.5000 mg | SUBCUTANEOUS | 1 refills | Status: DC

## 2019-05-10 MED ORDER — LEVOTHYROXINE SODIUM 50 MCG PO TABS: 50.0000 ug | ORAL_TABLET | Freq: Every day | ORAL | 1 refills | Status: DC

## 2019-05-10 MED ORDER — ALLOPURINOL 100 MG PO TABS: 100.0000 mg | ORAL_TABLET | Freq: Two times a day (BID) | ORAL | 0 refills | Status: DC

## 2019-05-10 MED ORDER — TRAZODONE HCL 50 MG PO TABS: 25.0000 mg | ORAL_TABLET | Freq: Every evening | ORAL | 0 refills | Status: DC | PRN

## 2019-05-10 MED ORDER — METFORMIN HCL 850 MG PO TABS: 850.0000 mg | ORAL_TABLET | Freq: Two times a day (BID) | ORAL | 1 refills | Status: DC

## 2019-05-10 MED ORDER — PRAVASTATIN SODIUM 40 MG PO TABS: ORAL_TABLET | ORAL | 1 refills | Status: DC

## 2019-05-10 NOTE — Progress Notes (Signed)
Name: Susan Bowen   MRN: [id number]    DOB: 11-24-1931   Date:05/10/2019       Progress Note  Subjective  Chief Complaint  Chief Complaint  Patient presents with  . Hypertension  . Osteoporosis  . Dyslipidemia  . Diabetes    HPI  DMII with microalbuminuria; she is taking medications,ARB and statin therapy. FSBS at home has been130-170's   She has occasional polyphagia, polydipsia but no  polyuria. Her hgbA1C was 10.5% lately below 8.5% and today is 7.8% - that is good control for her based on her age She is still following a diabetic diet most of the time.She has been taking Metformin twice daily, Trulicity, Jardiance and is doing well - GFR still above 45. Eye exam is up to date, normal foot exam today except for gout on right toe, mild erythema and pain    HTN: she has been taking medication. No chest pain very seldom has  palpitation, she states symptoms improved since seen by Dr. Fletcher Anon and coreg dose increased to 25 mg BID. She denies dizziness.BO is at goal. She is also on Benicar HCTZ and explained HCTZ can cause gout to flare, she does not want to change medications at this time  Afib : under the care of Dr. Fletcher Anon, rate controlled, denies sob or palpitation On Eliquis   Hyperlipidemia: taking Pravastatin and denies side effects of medications, no myalgia. Unchanged   Pulmonary nodule: seen by Dr. Raul Del, lung nodule stable and finished CT in 2017 . She has  yearly follow ups She had an echo done that showed Tricuspid regurgitation and LVH. Seen by Dr. Fletcher Anon, negative stress test.She is doing well at this time, off inhalers.She has OSA and Dr. Raul Del advised to use noctunal oxygen, she was unable to afford oxygen when off Medicaid, advise to try to re- apply for it.She missed last appointment with Dr. Raul Del because she felt like it was a waste of time. She states depends on children to drive her around. Unchanged  Carcinoid tumor with metastasis: incidental finding  on CT abdomen done for evaluation of renal stone back in 03/26/2019, it showed retroperitoneal lymphadenopathy, she has since seen Dr. Janese Banks and has been diagnosed with carcinoid tumor with metastasis and going to have watchful waiting. since she has been asymptomatic   Goiter: s/p thyroid ablation, Summer 2016. Seeing Dr. Gabriel Carina, she is on Levothyroxine 25 mcg daily, she has regular follow ups with Dr. Gabriel Carina, last TSH was at goal 11/2018   Insomnia: she used to take medication, she thinks it was a medication started with a T. She is willing to try Trazodone again, she is able to fall asleep but unable to stay asleep   Morbid obesity: BMI above 35 with co-morbidities ( DM, HTN, gout) she is also 87 and has co-morbidities, losing weight and advised to not stress about weight loss.   CKI stage III: stable, no pruritis. Good urine output   Patient Active Problem List   Diagnosis Date Noted  . Pain in joint of left shoulder 02/25/2019  . Pulmonary hypertension, unspecified ([place]) 12/28/2018  . Paroxysmal A-fib ([place]) 12/28/2018  . Chronic kidney disease, stage III (moderate) ([place]) 04/01/2018  . Elevated parathyroid hormone 01/01/2018  . Elevated uric acid in blood 04/29/2017  . Plantar fasciitis of left foot 03/12/2016  . Hypothyroidism, postablative 01/23/2016  . Aortic stenosis 09/26/2015  . Allergic rhinitis, seasonal 08/08/2015  . Benign hypertension 08/08/2015  . History of pneumonia 08/08/2015  . Carpal  tunnel syndrome 08/08/2015  . Cataract 08/08/2015  . Insomnia, persistent 08/08/2015  . Dyslipidemia 08/08/2015  . History of depression 08/08/2015  . Glaucoma 08/08/2015  . Controlled gout 08/08/2015  . Fever blister 08/08/2015  . Bilateral hearing loss 08/08/2015  . Hemorrhoid 08/08/2015  . H/O iron deficiency anemia 08/08/2015  . Benign neoplasm of colon 08/08/2015  . Impingement syndrome of shoulder 08/08/2015  . Osteoporosis, post-menopausal 08/08/2015  . Morbid obesity ([place])  08/08/2015  . Generalized OA 08/08/2015  . Multinodular goiter 08/08/2015  . Asymptomatic varicose veins 08/08/2015  . Polyp of vocal cord 08/08/2015  . History of vertebral compression fracture 08/08/2015  . Diabetes mellitus with proteinuric diabetic nephropathy ([place]) 08/08/2015  . LVH (left ventricular hypertrophy) 08/08/2015  . Moderate tricuspid regurgitation 08/08/2015  . History of radioactive iodine thyroid ablation 08/08/2015  . Lung nodule, solitary 05/12/2014  . Chronic cough 05/11/2014  . Vitamin D deficiency 12/20/2009  . Plantar fascial fibromatosis 12/20/2009  . Personal history of fall 07/20/2008  . Cervical radiculitis 05/11/2008    Past Surgical History:  Procedure Laterality Date  . ABDOMINAL HYSTERECTOMY  1986  . APPENDECTOMY    . BREAST SURGERY Bilateral 1983   biopsy of each side, negative  . CARPAL TUNNEL RELEASE    . CATARACT EXTRACTION W/PHACO Left 09/03/2017   Procedure: CATARACT EXTRACTION PHACO AND INTRAOCULAR LENS PLACEMENT (IOC)-LEFT DIABETIC;  Surgeon: Birder Robson, MD;  Location: [facility] ORS;  Service: Ophthalmology;  Laterality: Left;  [place] 01:10.2 AP% 16.7 CDE 11.71 Fluid Pack Lot # [id number]  . CATARACT EXTRACTION W/PHACO Right 09/30/2017   Procedure: CATARACT EXTRACTION PHACO AND INTRAOCULAR LENS PLACEMENT (IOC);  Surgeon: Birder Robson, MD;  Location: [facility] ORS;  Service: Ophthalmology;  Laterality: Right;  [place] 01:35.1 AP% 13.1 CDE 12.44 Fluid pack Lot # [id number]  . COLON SURGERY    . EYE SURGERY Left 09/03/2017   Cataract extraction  . FEMUR FRACTURE SURGERY Left   . FRACTURE SURGERY  2001   left femur  . HEMORROIDECTOMY    . polyp removed from vocal cord      Family History  Problem Relation Age of Onset  . Diabetes Mother   . Hypertension Mother   . Dementia Mother   . Hypertension Son     Social History   Socioeconomic History  . Marital status: Divorced    Spouse name: Not on file  . Number of children: 6  . Years of  education: Not on file  . Highest education level: 10th grade  Occupational History  . Not on file  Social Needs  . Financial resource strain: Somewhat hard  . Food insecurity    Worry: Never true    Inability: Never true  . Transportation needs    Medical: No    Non-medical: No  Tobacco Use  . Smoking status: Never Smoker  . Smokeless tobacco: Never Used  Substance and Sexual Activity  . Alcohol use: No    Alcohol/week: 0.0 standard drinks  . Drug use: No  . Sexual activity: Not Currently  Lifestyle  . Physical activity    Days per week: 7 days    Minutes per session: 30 min  . Stress: Not at all  Relationships  . Social connections    Talks on phone: More than three times a week    Gets together: Twice a week    Attends religious service: More than 4 times per year    Active member of club or organization: Yes  Attends meetings of clubs or organizations: 1 to 4 times per year    Relationship status: Divorced  . Intimate partner violence    Fear of current or ex partner: No    Emotionally abused: No    Physically abused: No    Forced sexual activity: No  Other Topics Concern  . Not on file  Social History Narrative  . Not on file     Current Outpatient Medications:  .  apixaban (ELIQUIS) 5 MG TABS tablet, Take 1 tablet (5 mg total) by mouth 2 (two) times daily., Disp: 180 tablet, Rfl: 1 .  Blood Glucose Monitoring Suppl (ACCU-CHEK AVIVA PLUS) w/Device KIT, 1 kit by Does not apply route 3 (three) times daily., Disp: 1 kit, Rfl: 0 .  carvedilol (COREG) 25 MG tablet, Take 1 tablet (25 mg total) by mouth 2 (two) times daily. New dose, Disp: 180 tablet, Rfl: 3 .  COMBIGAN 0.2-0.5 % ophthalmic solution, Place 1 drop into both eyes 2 (two) times daily., Disp: , Rfl:  .  Cyanocobalamin (B-12) 1000 MCG TABS, Take 1,000 mcg daily by mouth., Disp: , Rfl:  .  [START ON 05/13/2019] Dulaglutide (TRULICITY) 1.5 HK/7.4QV SOPN, Inject 1.5 mg into the skin every Thursday., Disp: 12  pen, Rfl: 1 .  empagliflozin (JARDIANCE) 25 MG TABS tablet, Take 25 mg by mouth daily., Disp: 90 tablet, Rfl: 1 .  glucose blood (ACCU-CHEK AVIVA PLUS) test strip, Check fasting glucose once each morning, and check glucose as needed., Disp: 100 each, Rfl: 12 .  Lancets (ACCU-CHEK MULTICLIX) lancets, Use as instructed, Disp: 204 each, Rfl: 12 .  levothyroxine (SYNTHROID) 50 MCG tablet, Take 1 tablet (50 mcg total) by mouth daily before breakfast., Disp: 90 tablet, Rfl: 1 .  loratadine (CLARITIN) 10 MG tablet, TAKE 1 TABLET (10 MG TOTAL) BY MOUTH DAILY., Disp: 90 tablet, Rfl: 2 .  metFORMIN (GLUCOPHAGE) 850 MG tablet, Take 1 tablet (850 mg total) by mouth 2 (two) times daily., Disp: 180 tablet, Rfl: 1 .  olmesartan-hydrochlorothiazide (BENICAR HCT) 40-25 MG tablet, TAKE 1 TABLET BY MOUTH EVERY DAY, Disp: 90 tablet, Rfl: 0 .  ondansetron (ZOFRAN) 4 MG tablet, Take 1 tablet (4 mg total) by mouth every 8 (eight) hours as needed for nausea or vomiting., Disp: 20 tablet, Rfl: 0 .  pravastatin (PRAVACHOL) 40 MG tablet, TAKE 1 TABLET BY MOUTH EVERY DAY, Disp: 90 tablet, Rfl: 1 .  Travoprost, BAK Free, (TRAVATAN) 0.004 % SOLN ophthalmic solution, Place 1 drop into both eyes at bedtime., Disp: , Rfl:  .  Vitamin D, Ergocalciferol, (DRISDOL) 1.25 MG (50000 UT) CAPS capsule, Take weekly, Disp: 12 capsule, Rfl: 1  Allergies  Allergen Reactions  . Augmentin [Amoxicillin-Pot Clavulanate] Itching    Has patient had a PCN reaction causing immediate rash, facial/tongue/throat swelling, SOB or lightheadedness with hypotension: No Has patient had a PCN reaction causing severe rash involving mucus membranes or skin necrosis: No Has patient had a PCN reaction that required hospitalization: No Has patient had a PCN reaction occurring within the last 10 years: No If all of the above answers are "NO", then may proceed with Cephalosporin use.     I personally reviewed active problem list, medication list, allergies,  family history, social history with the patient/caregiver today.   ROS  Ten systems reviewed and is negative except as mentioned in HPI   Objective  Vitals:   05/10/19 1330  BP: 120/84  Pulse: 95  Resp: 16  Temp: (!) 97.3 F (36.3  C)  TempSrc: Oral  SpO2: 96%  Weight: 210 lb 8 oz (95.5 kg)  Height: 5' 3"  (1.6 m)    Body mass index is 37.29 kg/m.  Physical Exam  Constitutional: Patient appears well-developed and well-nourished. Obese  No distress.  HEENT: head atraumatic, normocephalic, pupils equal and reactive to light, neck supple Cardiovascular: Normal rate, regular rhythm and normal heart sounds.  No murmur heard. No BLE edema. Pulmonary/Chest: Effort normal and breath sounds normal. No respiratory distress. Abdominal: Soft.  There is no tenderness. Muscular skeletal: podagra  Psychiatric: Patient has a normal mood and affect. behavior is normal. Judgment and thought content normal.  Recent Results (from the past 2160 hour(s))  POCT urinalysis dipstick     Status: Abnormal   Collection Time: 03/26/19  1:28 PM  Result Value Ref Range   Color, UA yellow    Clarity, UA clear    Glucose, UA Positive (A) Negative    Comment: 2000 or more   Bilirubin, UA neg    Ketones, UA neg    Spec Grav, UA 1.010 1.010 - 1.025   Blood, UA negative    pH, UA 5.0 5.0 - 8.0   Protein, UA Negative Negative   Urobilinogen, UA 0.2 0.2 or 1.0 E.U./dL   Nitrite, UA neg    Leukocytes, UA Small (1+) (A) Negative   Appearance     Odor    Troponin I - PRN     Status: None   Collection Time: 03/26/19  1:48 PM  Result Value Ref Range   Troponin I <0.03 <0.03 ng/mL    Comment: Performed at [facility], [address] [place], [place] [address]  Lipase, blood     Status: None   Collection Time: 03/26/19  1:48 PM  Result Value Ref Range   Lipase 40 11 - 51 U/L    Comment: Performed at [facility], [address] [place], [place] [address]  Comprehensive  metabolic panel     Status: Abnormal   Collection Time: 03/26/19  1:48 PM  Result Value Ref Range   Sodium 140 135 - 145 mmol/L   Potassium 3.7 3.5 - 5.1 mmol/L   Chloride 103 98 - 111 mmol/L   CO2 27 22 - 32 mmol/L   Glucose, Bld 155 (H) 70 - 99 mg/dL   BUN 19 8 - 23 mg/dL   Creatinine, Ser 1.34 (H) 0.44 - 1.00 mg/dL   Calcium 8.9 8.9 - 10.3 mg/dL   Total Protein 6.7 6.5 - 8.1 g/dL   Albumin 3.6 3.5 - 5.0 g/dL   AST 12 (L) 15 - 41 U/L   ALT 10 0 - 44 U/L   Alkaline Phosphatase 51 38 - 126 U/L   Total Bilirubin 0.2 (L) 0.3 - 1.2 mg/dL   GFR calc non Af Amer 36 (L) >60 mL/min   GFR calc Af Amer 42 (L) >60 mL/min   Anion gap 10 5 - 15    Comment: Performed at [facility], [address] [place], [place] [address]  CBC with Differential/Platelet     Status: Abnormal   Collection Time: 03/26/19  1:48 PM  Result Value Ref Range   WBC 10.1 4.0 - 10.5 K/uL   RBC 4.42 3.87 - 5.11 MIL/uL   Hemoglobin 11.5 (L) 12.0 - 15.0 g/dL   HCT 38.9 36.0 - 46.0 %   MCV 88.0 80.0 - 100.0 fL   MCH 26.0 26.0 - 34.0 pg   MCHC 29.6 (  L) 30.0 - 36.0 g/dL   RDW 15.8 (H) 11.5 - 15.5 %   Platelets 308 150 - 400 K/uL   nRBC 0.0 0.0 - 0.2 %   Neutrophils Relative % 51 %   Neutro Abs 5.3 1.7 - 7.7 K/uL   Lymphocytes Relative 38 %   Lymphs Abs 3.8 0.7 - 4.0 K/uL   Monocytes Relative 7 %   Monocytes Absolute 0.7 0.1 - 1.0 K/uL   Eosinophils Relative 3 %   Eosinophils Absolute 0.3 0.0 - 0.5 K/uL   Basophils Relative 1 %   Basophils Absolute 0.1 0.0 - 0.1 K/uL   Immature Granulocytes 0 %   Abs Immature Granulocytes 0.02 0.00 - 0.07 K/uL    Comment: Performed at [facility], [address], [place], [place] [address]  Amylase     Status: None   Collection Time: 03/26/19  1:48 PM  Result Value Ref Range   Amylase 75 28 - 100 U/L    Comment: Performed at [facility], [address] [place], [place] [address]  CK Total (and CKMB)     Status: None   Collection Time:  03/26/19  1:48 PM  Result Value Ref Range   Total CK 60 38 - 234 U/L   CK, MB 1.2 0.5 - 5.0 ng/mL   Relative Index RELATIVE INDEX IS INVALID 0.0 - 2.5    Comment: WHEN CK < 100 U/L        Performed at [facility], [address] [place], [place] [address]   CULTURE, URINE COMPREHENSIVE     Status: Abnormal   Collection Time: 03/26/19  1:59 PM   Specimen: Urine  Result Value Ref Range   MICRO NUMBER: [id number]    SPECIMEN QUALITY: Adequate    Source URINE, SUPRAPUBIC    STATUS: FINAL    ISOLATE 1: Lactobacillus species (A)     Comment: Greater than 100,000 CFU/mL of Lactobacillus species May represent colonizers from external and internal genitalia. No further testing (including susceptibility) will be performed.   ISOLATE 2: Yeast Isolated.     Comment: 1,000-10,000 CFU/mL of Yeast Isolated. Please contact the laboratory within 3 days if further identification is desired.  Lactate dehydrogenase     Status: None   Collection Time: 04/01/19 12:48 PM  Result Value Ref Range   LDH 115 98 - 192 U/L    Comment: Performed at [facility], [address] [place], [place] [address]  Uric acid     Status: Abnormal   Collection Time: 04/01/19 12:48 PM  Result Value Ref Range   Uric Acid, Serum 8.0 (H) 2.5 - 7.1 mg/dL    Comment: Performed at [facility], [address] [place],  [address]  Comprehensive metabolic panel     Status: Abnormal   Collection Time: 04/01/19 12:48 PM  Result Value Ref Range   Sodium 141 135 - 145 mmol/L   Potassium 3.8 3.5 - 5.1 mmol/L   Chloride 105 98 - 111 mmol/L   CO2 26 22 - 32 mmol/L   Glucose, Bld 154 (H) 70 - 99 mg/dL   BUN 13 8 - 23 mg/dL   Creatinine, Ser 1.15 (H) 0.44 - 1.00 mg/dL   Calcium 9.6 8.9 - 10.3 mg/dL   Total Protein 7.4 6.5 - 8.1 g/dL   Albumin 3.9 3.5 - 5.0 g/dL   AST 15 15 - 41 U/L   ALT 9 0 - 44 U/L   Alkaline Phosphatase 55  38 - 126 U/L   Total Bilirubin 0.4 0.3 - 1.2 mg/dL   GFR calc non  Af Amer 43 (L) >60 mL/min   GFR calc Af Amer 50 (L) >60 mL/min   Anion gap 10 5 - 15    Comment: Performed at [facility], [address] [place], [place] [address]  CBC with Differential/Platelet     Status: Abnormal   Collection Time: 04/01/19 12:48 PM  Result Value Ref Range   WBC 9.8 4.0 - 10.5 K/uL   RBC 4.62 3.87 - 5.11 MIL/uL   Hemoglobin 12.0 12.0 - 15.0 g/dL   HCT 39.5 36.0 - 46.0 %   MCV 85.5 80.0 - 100.0 fL   MCH 26.0 26.0 - 34.0 pg   MCHC 30.4 30.0 - 36.0 g/dL   RDW 15.9 (H) 11.5 - 15.5 %   Platelets 321 150 - 400 K/uL   nRBC 0.0 0.0 - 0.2 %   Neutrophils Relative % 58 %   Neutro Abs 5.7 1.7 - 7.7 K/uL   Lymphocytes Relative 32 %   Lymphs Abs 3.2 0.7 - 4.0 K/uL   Monocytes Relative 6 %   Monocytes Absolute 0.6 0.1 - 1.0 K/uL   Eosinophils Relative 3 %   Eosinophils Absolute 0.3 0.0 - 0.5 K/uL   Basophils Relative 1 %   Basophils Absolute 0.1 0.0 - 0.1 K/uL   Immature Granulocytes 0 %   Abs Immature Granulocytes 0.02 0.00 - 0.07 K/uL    Comment: Performed at [facility], [address] [place], [place] [address]  Glucose, capillary     Status: Abnormal   Collection Time: 04/07/19 12:09 PM  Result Value Ref Range   Glucose-Capillary 149 (H) 70 - 99 mg/dL  POCT HgB A1C     Status: Abnormal   Collection Time: 05/10/19  1:42 PM  Result Value Ref Range   Hemoglobin A1C 7.8 (A) 4.0 - 5.6 %   HbA1c POC (<> result, manual entry)     HbA1c, POC (prediabetic range)     HbA1c, POC (controlled diabetic range)      Diabetic Foot Exam: Diabetic Foot Exam - Simple   Simple Foot Form Visual Inspection No deformities, no ulcerations, no other skin breakdown bilaterally: Yes Sensation Testing Intact to touch and monofilament testing bilaterally: Yes Pulse Check Posterior Tibialis and Dorsalis pulse intact bilaterally: Yes Comments     PHQ2/9: Depression screen [facility] 2/9 05/10/2019 03/25/2019 12/28/2018 08/03/2018 06/26/2018  Decreased Interest 0 0 0  0 0  Down, Depressed, Hopeless 0 0 0 0 0  PHQ - 2 Score 0 0 0 0 0  Altered sleeping - 0 0 2 -  Tired, decreased energy - 0 0 2 -  Change in appetite - 0 0 0 -  Feeling bad or failure about yourself  - 0 0 0 -  Trouble concentrating - 0 0 0 -  Moving slowly or fidgety/restless - 0 0 0 -  Suicidal thoughts - 0 0 0 -  PHQ-9 Score - 0 0 4 -  Difficult doing work/chores - Not difficult at all Not difficult at all Not difficult at all -    phq 9 is negative   Fall Risk: Fall Risk  05/10/2019 03/25/2019 12/28/2018 08/03/2018 06/26/2018  Falls in the past year? 0 0 0 No No  Number falls in past yr: 0 0 0 - -  Injury with Fall? 0 0 0 - -  Follow up - - Falls evaluation completed - -  Functional Status Survey: Is the patient deaf or have difficulty hearing?: Yes Does the patient have difficulty seeing, even when wearing glasses/contacts?: Yes Does the patient have difficulty concentrating, remembering, or making decisions?: No Does the patient have difficulty walking or climbing stairs?: No Does the patient have difficulty dressing or bathing?: No Does the patient have difficulty doing errands alone such as visiting a doctor's office or shopping?: No    Assessment & Plan  1. Diabetes mellitus with proteinuric diabetic nephropathy (HCC)  - POCT HgB A1C - metFORMIN (GLUCOPHAGE) 850 MG tablet; Take 1 tablet (850 mg total) by mouth 2 (two) times daily.  Dispense: 180 tablet; Refill: 1 - Dulaglutide (TRULICITY) 1.5 KW/4.0XB SOPN; Inject 1.5 mg into the skin every Thursday.  Dispense: 12 pen; Refill: 1 - empagliflozin (JARDIANCE) 25 MG TABS tablet; Take 25 mg by mouth daily.  Dispense: 90 tablet; Refill: 1  2. Dyslipidemia associated with type 2 diabetes mellitus (HCC)  - metFORMIN (GLUCOPHAGE) 850 MG tablet; Take 1 tablet (850 mg total) by mouth 2 (two) times daily.  Dispense: 180 tablet; Refill: 1 - Dulaglutide (TRULICITY) 1.5 DZ/3.2DJ SOPN; Inject 1.5 mg into the skin every Thursday.   Dispense: 12 pen; Refill: 1 - empagliflozin (JARDIANCE) 25 MG TABS tablet; Take 25 mg by mouth daily.  Dispense: 90 tablet; Refill: 1  3. Dyslipidemia  - pravastatin (PRAVACHOL) 40 MG tablet; TAKE 1 TABLET BY MOUTH EVERY DAY  Dispense: 90 tablet; Refill: 1  4. Atherosclerosis of aorta ([place])  On statin therapy   5. Paroxysmal A-fib (HCC)  Rate controlled  6. Morbid obesity ([place])  Discussed with the patient the risk posed by an increased BMI. Discussed importance of portion control, calorie counting and at least 150 minutes of physical activity weekly. Avoid sweet beverages and drink more water. Eat at least 6 servings of fruit and vegetables daily   7. Benign hypertension  At goal, may need to stop hctz because of gout   8. Chronic kidney disease, stage III (moderate) (HCC)  Stable GFR   9. Primary insomnia  - traZODone (DESYREL) 50 MG tablet; Take 0.5-1 tablets (25-50 mg total) by mouth at bedtime as needed for sleep.  Dispense: 90 tablet; Refill: 0  10. Acute gout of right foot, unspecified cause  - colchicine (COLCRYS) 0.6 MG tablet; Take 1-2 tablets (0.6-1.2 mg total) by mouth daily as needed. For gout, not daily  Dispense: 30 tablet; Refill: 0 - allopurinol (ZYLOPRIM) 100 MG tablet; Take 1 tablet (100 mg total) by mouth 2 (two) times daily. To prevent gout  Dispense: 180 tablet; Refill: 0

## 2019-05-11 ENCOUNTER — Encounter: Payer: Self-pay | Admitting: Family Medicine

## 2019-05-14 LAB — 5 HIAA, QUANTITATIVE, URINE, 24 HOUR
5-HIAA, Ur: 8.7 mg/L
5-HIAA,Quant.,24 Hr Urine: 16.5 mg/24 hr — ABNORMAL HIGH (ref 0.0–14.9)
Total Volume: 1900

## 2019-05-18 ENCOUNTER — Other Ambulatory Visit: Payer: Self-pay | Admitting: Family Medicine

## 2019-05-18 DIAGNOSIS — M109 Gout, unspecified: Secondary | ICD-10-CM

## 2019-05-18 DIAGNOSIS — R1012 Left upper quadrant pain: Secondary | ICD-10-CM

## 2019-05-18 DIAGNOSIS — R140 Abdominal distension (gaseous): Secondary | ICD-10-CM

## 2019-05-27 ENCOUNTER — Ambulatory Visit: Payer: Medicare Other | Admitting: Gastroenterology

## 2019-06-29 DIAGNOSIS — S8011XA Contusion of right lower leg, initial encounter: Secondary | ICD-10-CM | POA: Diagnosis not present

## 2019-06-29 DIAGNOSIS — L089 Local infection of the skin and subcutaneous tissue, unspecified: Secondary | ICD-10-CM | POA: Diagnosis not present

## 2019-06-30 ENCOUNTER — Ambulatory Visit: Payer: Medicare Other | Admitting: Family Medicine

## 2019-06-30 DIAGNOSIS — H26493 Other secondary cataract, bilateral: Secondary | ICD-10-CM | POA: Diagnosis not present

## 2019-06-30 LAB — HM DIABETES EYE EXAM

## 2019-07-04 ENCOUNTER — Other Ambulatory Visit: Payer: Self-pay | Admitting: Cardiovascular Disease

## 2019-07-05 NOTE — Telephone Encounter (Signed)
68f 95.5kg Scr 1.15 04/01/19 Lovw/arida 01/19/19

## 2019-07-05 NOTE — Telephone Encounter (Signed)
Please review Eliquis refill, Thanks !

## 2019-07-07 DIAGNOSIS — L089 Local infection of the skin and subcutaneous tissue, unspecified: Secondary | ICD-10-CM | POA: Diagnosis not present

## 2019-07-07 DIAGNOSIS — S8011XD Contusion of right lower leg, subsequent encounter: Secondary | ICD-10-CM | POA: Diagnosis not present

## 2019-07-13 DIAGNOSIS — E890 Postprocedural hypothyroidism: Secondary | ICD-10-CM | POA: Diagnosis not present

## 2019-07-14 ENCOUNTER — Other Ambulatory Visit: Payer: Self-pay | Admitting: Family Medicine

## 2019-07-14 DIAGNOSIS — E559 Vitamin D deficiency, unspecified: Secondary | ICD-10-CM

## 2019-07-14 NOTE — Telephone Encounter (Signed)
Requested medication (s) are due for refill today: yes  Requested medication (s) are on the active medication list: yes  Last refill:  04/12/2019  Future visit scheduled: no  Notes to clinic:  50,000 IU strengths are not delegated   Requested Prescriptions  Pending Prescriptions Disp Refills   Vitamin D, Ergocalciferol, (DRISDOL) 1.25 MG (50000 UT) CAPS capsule [Pharmacy Med Name: VITAMIN D2 1.25MG (50,000 UNIT)] 12 capsule 1    Sig: Take weekly     Endocrinology:  Vitamins - Vitamin D Supplementation Failed - 07/14/2019  8:10 AM      Failed - 50,000 IU strengths are not delegated      Failed - Phosphate in normal range and within 360 days    No results found for: PHOS       Failed - Vitamin D in normal range and within 360 days    Vit D, 25-Hydroxy  Date Value Ref Range Status  12/31/2017 43 30 - 100 ng/mL Final    Comment:    Vitamin D Status         25-OH Vitamin D: . Deficiency:                    <20 ng/mL Insufficiency:             20 - 29 ng/mL Optimal:                 > or = 30 ng/mL . For 25-OH Vitamin D testing on patients on  D2-supplementation and patients for whom quantitation  of D2 and D3 fractions is required, the QuestAssureD(TM) 25-OH VIT D, (D2,D3), LC/MS/MS is recommended: order  code [id number] (patients >71yrs). . For more information on this test, go to: http://education.questdiagnostics.com/faq/FAQ163 (This link is being provided for  informational/educational purposes only.)          Passed - Ca in normal range and within 360 days    Calcium  Date Value Ref Range Status  04/01/2019 9.6 8.9 - 10.3 mg/dL Final   Calcium, Total  Date Value Ref Range Status  03/28/2014 8.9 8.5 - 10.1 mg/dL Final         Passed - Valid encounter within last 12 months    Recent Outpatient Visits          2 months ago Diabetes mellitus with proteinuric diabetic nephropathy [facility])   [facility] Steele Sizer, MD   3 months ago Bloating   [facility] Steele Sizer, MD   3 months ago Paroxysmal A-fib [facility])   [facility] Steele Sizer, MD   6 months ago Dyslipidemia associated with type 2 diabetes mellitus [facility])   [facility] Steele Sizer, MD   11 months ago Diabetes mellitus with proteinuric diabetic nephropathy [facility])   [facility] Steele Sizer, MD      Future Appointments            In 1 week Wellington Hampshire, MD [facility], [facility]   In 1 month Steele Sizer, MD [facility], [facility]

## 2019-07-20 DIAGNOSIS — E890 Postprocedural hypothyroidism: Secondary | ICD-10-CM | POA: Diagnosis not present

## 2019-07-23 ENCOUNTER — Ambulatory Visit (INDEPENDENT_AMBULATORY_CARE_PROVIDER_SITE_OTHER): Payer: Medicare Other | Admitting: Cardiovascular Disease

## 2019-07-23 ENCOUNTER — Other Ambulatory Visit: Payer: Self-pay

## 2019-07-23 ENCOUNTER — Encounter: Payer: Self-pay | Admitting: Cardiovascular Disease

## 2019-07-23 VITALS — BP 118/62 | HR 83 | Ht 64.0 in | Wt 208.5 lb

## 2019-07-23 DIAGNOSIS — I480 Paroxysmal atrial fibrillation: Secondary | ICD-10-CM | POA: Diagnosis not present

## 2019-07-23 DIAGNOSIS — I10 Essential (primary) hypertension: Secondary | ICD-10-CM

## 2019-07-23 DIAGNOSIS — E785 Hyperlipidemia, unspecified: Secondary | ICD-10-CM

## 2019-07-23 NOTE — Progress Notes (Signed)
Cardiology Office Note   Date:  07/23/2019   ID:  Susan Bowen, DOB 02-25-1932, MRN [id number]  PCP:  Steele Sizer, MD  Cardiologist:  Kathlyn Sacramento, MD   Chief Complaint  Patient presents with  . OTHER    6 month f/u c/o fluttering heart rate. Meds reviewed verbally with pt.      History of Present Illness: Susan Bowen is a 85 y.o. female who presents for a follow-up visit regarding paroxysmal atrial fibrillation and mild aortic stenosis. She has multiple chronic medical conditions that include hypertension, diabetes and hyperlipidemia.   She is not a smoker and has no family history of premature coronary artery disease. She was hospitalized in May, 2019 with chest pain in the setting of A. fib with RVR.  The dose of carvedilol was increased to 12.5 mg twice daily and she was started on Eliquis for anticoagulation.  Echocardiogram showed normal LV systolic function with grade 1 diastolic dysfunction. She ultimately underwent outpatient [facility] which showed no evidence of ischemia with normal ejection fraction. Since her last visit, she was diagnosed with carcinoid tumor with metastasis.  This was an incidental finding on CT of the abdomen which was done for renal stone back in May.  She has been seen by oncology and going to have watchful waiting for now.  She has been doing very well with no recent chest pain or shortness of breath.  She continues to have mild palpitations mostly at night when she is trying to lie down.  No palpitations during the day.  No side effects with anticoagulation.  Past Medical History:  Diagnosis Date  . Allergy   . Back spasm   . Bunion   . Carpal tunnel syndrome   . Cataracta   . Diabetes mellitus without complication ([place])   . Generalized osteoarthritis   . Gout   . Hemorrhoids without complication   . Hyperlipidemia   . Hypertension   . Hypothyroidism   . Impingement syndrome of right shoulder   . Insomnia   . Lipoma   .  Lung nodule   . Neuritis or radiculitis due to rupture of lumbar intervertebral disc   . Obesity   . Osteoporosis   . PAF (paroxysmal atrial fibrillation) ([place])    a. 03/2018 AF w/ RVR-->converted spont.  CHA2DS2VASc = 7-->Eliquis.  . Proteinuria   . Rotator cuff tear   . Systolic murmur    a. 06/8915 Echo: EF 60-65%, no rwma, Gr1 DD, mild MR, mildly dil LA. Nl RV fxn. PASP 10mHg.  .Marland KitchenTIA (transient ischemic attack)   . TIA (transient ischemic attack) 1998   small TIA.  no residual effects  . Tinnitus of both ears   . Unspecified glaucoma(365.9)   . Vitamin D deficiency   . Wedge compression fracture of t11-T12 vertebra, sequela     Past Surgical History:  Procedure Laterality Date  . ABDOMINAL HYSTERECTOMY  1986  . APPENDECTOMY    . BREAST SURGERY Bilateral 1983   biopsy of each side, negative  . CARPAL TUNNEL RELEASE    . CATARACT EXTRACTION W/PHACO Left 09/03/2017   Procedure: CATARACT EXTRACTION PHACO AND INTRAOCULAR LENS PLACEMENT (IOC)-LEFT DIABETIC;  Surgeon: PBirder Robson MD;  Location: [facility] ORS;  Service: Ophthalmology;  Laterality: Left;  [place]:10.2 AP% 16.7 CDE 11.71 Fluid Pack Lot # [id number] . CATARACT EXTRACTION W/PHACO Right 09/30/2017   Procedure: CATARACT EXTRACTION PHACO AND INTRAOCULAR LENS PLACEMENT (IOC);  Surgeon: PBirder Robson MD;  Location: [facility] ORS;  Service: Ophthalmology;  Laterality: Right;  [place] 01:35.1 AP% 13.1 CDE 12.44 Fluid pack Lot # [id number]  . COLON SURGERY    . EYE SURGERY Left 09/03/2017   Cataract extraction  . FEMUR FRACTURE SURGERY Left   . FRACTURE SURGERY  2001   left femur  . HEMORROIDECTOMY    . polyp removed from vocal cord       Current Outpatient Medications  Medication Sig Dispense Refill  . allopurinol (ZYLOPRIM) 100 MG tablet Take 1 tablet (100 mg total) by mouth 2 (two) times daily. To prevent gout (Patient taking differently: Take 100 mg by mouth 2 (two) times daily as needed. To prevent gout) 180 tablet 0  .  Blood Glucose Monitoring Suppl (ACCU-CHEK AVIVA PLUS) w/Device KIT 1 kit by Does not apply route 3 (three) times daily. 1 kit 0  . carvedilol (COREG) 25 MG tablet Take 1 tablet (25 mg total) by mouth 2 (two) times daily. New dose 180 tablet 3  . COLCRYS 0.6 MG tablet TAKE 1-2 TABLETS (0.6-1.2 MG TOTAL) BY MOUTH DAILY AS NEEDED. FOR GOUT, NOT DAILY 90 tablet 0  . COMBIGAN 0.2-0.5 % ophthalmic solution Place 1 drop into both eyes 2 (two) times daily.    . Cyanocobalamin (B-12) 1000 MCG TABS Take 1,000 mcg daily by mouth.    . Dulaglutide (TRULICITY) 1.5 CH/8.5ID SOPN Inject 1.5 mg into the skin every Thursday. 12 pen 1  . ELIQUIS 5 MG TABS tablet TAKE 1 TABLET BY MOUTH TWICE A DAY 180 tablet 1  . empagliflozin (JARDIANCE) 25 MG TABS tablet Take 25 mg by mouth daily. 90 tablet 1  . glucose blood (ACCU-CHEK AVIVA PLUS) test strip Check fasting glucose once each morning, and check glucose as needed. 100 each 12  . Lancets (ACCU-CHEK MULTICLIX) lancets Use as instructed 204 each 12  . levothyroxine (SYNTHROID) 50 MCG tablet Take 1 tablet (50 mcg total) by mouth daily before breakfast. 90 tablet 1  . loratadine (CLARITIN) 10 MG tablet TAKE 1 TABLET (10 MG TOTAL) BY MOUTH DAILY. 90 tablet 2  . metFORMIN (GLUCOPHAGE) 850 MG tablet Take 1 tablet (850 mg total) by mouth 2 (two) times daily. 180 tablet 1  . olmesartan-hydrochlorothiazide (BENICAR HCT) 40-25 MG tablet TAKE 1 TABLET BY MOUTH EVERY DAY 90 tablet 0  . ondansetron (ZOFRAN) 4 MG tablet Take 1 tablet (4 mg total) by mouth every 8 (eight) hours as needed for nausea or vomiting. 20 tablet 0  . pravastatin (PRAVACHOL) 40 MG tablet TAKE 1 TABLET BY MOUTH EVERY DAY 90 tablet 1  . Travoprost, BAK Free, (TRAVATAN) 0.004 % SOLN ophthalmic solution Place 1 drop into both eyes at bedtime.    . traZODone (DESYREL) 50 MG tablet Take 0.5-1 tablets (25-50 mg total) by mouth at bedtime as needed for sleep. 90 tablet 0  . Vitamin D, Ergocalciferol, (DRISDOL) 1.25 MG  (50000 UT) CAPS capsule TAKE WEEKLY 12 capsule 1   No current facility-administered medications for this visit.     Allergies:   Augmentin [amoxicillin-pot clavulanate]    Social History:  The patient  reports that she has never smoked. She has never used smokeless tobacco. She reports that she does not drink alcohol or use drugs.   Family History:  The patient's family history includes Dementia in her mother; Diabetes in her mother; Hypertension in her mother and son.    ROS:  Please see the history of present illness.   Otherwise, review of systems are  positive for none.   All other systems are reviewed and negative.    PHYSICAL EXAM: VS:  BP 118/62 (BP Location: Left Arm, Patient Position: Sitting, Cuff Size: Large)   Pulse 83   Ht 5' 4"  (1.626 m)   Wt 208 lb 8 oz (94.6 kg)   LMP  (LMP Unknown)   SpO2 97%   BMI 35.79 kg/m  , BMI Body mass index is 35.79 kg/m. GEN: Well nourished, well developed, in no acute distress  HEENT: normal  Neck: no JVD, carotid bruits, or masses Cardiac: RRR; no  rubs, or gallops. Trace bilateral leg edema. 1 / 6 systolic murmur in the aortic area Respiratory:  clear to auscultation bilaterally, normal work of breathing GI: soft, nontender, nondistended, + BS MS: no deformity or atrophy  Skin: warm and dry, no rash Neuro:  Strength and sensation are intact Psych: euthymic mood, full affect   EKG:  EKG is ordered today. The ekg ordered today demonstrates normal sinus rhythm with sinus arrhythmia.  Recent Labs: 04/01/2019: ALT 9; BUN 13; Creatinine, Ser 1.15; Hemoglobin 12.0; Platelets 321; Potassium 3.8; Sodium 141    Lipid Panel    Component Value Date/Time   CHOL 140 12/28/2018 0947   CHOL 146 12/19/2016 0951   TRIG 121 12/28/2018 0947   HDL 51 12/28/2018 0947   HDL 50 12/19/2016 0951   CHOLHDL 2.7 12/28/2018 0947   LDLCALC 69 12/28/2018 0947      Wt Readings from Last 3 Encounters:  07/23/19 208 lb 8 oz (94.6 kg)  05/10/19 210  lb 8 oz (95.5 kg)  05/06/19 210 lb 12.8 oz (95.6 kg)      No flowsheet data found.    ASSESSMENT AND PLAN:  1.  Paroxysmal atrial fibrillation: She continues to be in sinus rhythm.  She has very mild nighttime palpitations which could be due to PVCs.  Her symptoms are overall very mild.   Continue carvedilol 25 mg twice daily and anticoagulation with Eliquis. I reviewed most recent labs done in May which showed mild anemia with a hemoglobin of 11.5.  Renal function remained stable with a creatinine of 1.34.  2. Essential hypertension: Blood pressure is well controlled on current medications.    3. Hyperlipidemia: Currently on pravastatin with most recent LDL of 61.   Disposition:   FU with me in 6 months   Signed, Kathlyn Sacramento, MD 07/23/19

## 2019-07-23 NOTE — Patient Instructions (Addendum)
Medication Instructions:  Your physician recommends that you continue on your current medications as directed. Please refer to the Current Medication list given to you today.  If you need a refill on your cardiac medications before your next appointment, please call your pharmacy.   Lab work: None ordered If you have labs (blood work) drawn today and your tests are completely normal, you will receive your results only by: . MyChart Message (if you have MyChart) OR . A paper copy in the mail If you have any lab test that is abnormal or we need to change your treatment, we will call you to review the results.  Testing/Procedures: None ordered  Follow-Up: At [facility], you and your health needs are our priority.  As part of our continuing mission to provide you with exceptional heart care, we have created designated Provider Care Teams.  These Care Teams include your primary Cardiologist (physician) and Advanced Practice Providers (APPs -  Physician Assistants and Nurse Practitioners) who all work together to provide you with the care you need, when you need it. You will need a follow up appointment in 6 months.  Please call our office 2 months in advance to schedule this appointment.  You may see Muhammad Arida, MD or one of the following Advanced Practice Providers on your designated Care Team:   Christopher Berge, NP Ryan Dunn, PA-C . Jacquelyn Visser, PA-C  Any Other Special Instructions Will Be Listed Below (If Applicable). N/A   

## 2019-07-31 ENCOUNTER — Other Ambulatory Visit: Payer: Self-pay | Admitting: Family Medicine

## 2019-07-31 ENCOUNTER — Other Ambulatory Visit: Payer: Self-pay | Admitting: Cardiovascular Disease

## 2019-07-31 DIAGNOSIS — J302 Other seasonal allergic rhinitis: Secondary | ICD-10-CM

## 2019-07-31 DIAGNOSIS — M109 Gout, unspecified: Secondary | ICD-10-CM

## 2019-07-31 DIAGNOSIS — F5101 Primary insomnia: Secondary | ICD-10-CM

## 2019-07-31 DIAGNOSIS — R140 Abdominal distension (gaseous): Secondary | ICD-10-CM

## 2019-07-31 DIAGNOSIS — R1012 Left upper quadrant pain: Secondary | ICD-10-CM

## 2019-08-24 ENCOUNTER — Other Ambulatory Visit: Payer: Self-pay | Admitting: Family Medicine

## 2019-08-24 DIAGNOSIS — R140 Abdominal distension (gaseous): Secondary | ICD-10-CM

## 2019-08-24 DIAGNOSIS — R1012 Left upper quadrant pain: Secondary | ICD-10-CM

## 2019-08-27 ENCOUNTER — Telehealth: Payer: Self-pay | Admitting: Family Medicine

## 2019-08-27 NOTE — Chronic Care Management (AMB) (Signed)
°  Chronic Care Management   Outreach Note  08/27/2019 Name: Susan Bowen MRN: [id number] DOB: December 01, 1931  Referred by: Steele Sizer, MD Reason for referral : Chronic Care Management [facility] outreach was unsuccessful.)   An unsuccessful telephone outreach was attempted today. The patient was referred to the case management team by for assistance with care management and care coordination.   Follow Up Plan: A HIPPA compliant phone message was left for the patient providing contact information and requesting a return call.  The care management team will reach out to the patient again over the next 7 days.  If patient returns call to provider office, please advise to call [facility] at [place]  [email or web address]   ??[phone]

## 2019-08-27 NOTE — Chronic Care Management (AMB) (Signed)
Chronic Care Management   Note  08/27/2019 Name: Susan Bowen MRN: [id number] DOB: [birth year]  Susan Bowen is a 85 y.o. year old female who is a primary care patient of Steele Sizer, MD. I reached out to Susan Bowen by phone today in response to a referral sent by Susan Bowen's health plan.     Susan Bowen was given information about Chronic Care Management services today including:  1. CCM service includes personalized support from designated clinical staff supervised by her physician, including individualized plan of care and coordination with other care providers 2. 24/7 contact phone numbers for assistance for urgent and routine care needs. 3. Service will only be billed when office clinical staff spend 20 minutes or more in a month to coordinate care. 4. Only one practitioner may furnish and bill the service in a calendar month. 5. The patient may stop CCM services at any time (effective at the end of the month) by phone call to the office staff. 6. The patient will be responsible for cost sharing (co-pay) of up to 20% of the service fee (after annual deductible is met).  Patient agreed to services and verbal consent obtained.   Follow up plan: Telephone appointment with CCM team member scheduled for: 09/17/2019  [place]  [email or web address]   ??[phone]

## 2019-08-30 ENCOUNTER — Other Ambulatory Visit: Payer: Self-pay

## 2019-08-30 ENCOUNTER — Ambulatory Visit
Admission: RE | Admit: 2019-08-30 | Discharge: 2019-08-30 | Disposition: A | Discharge status: Home / Self Care | Payer: Medicare Other | Source: Ambulatory Visit | Attending: Oncology | Admitting: Oncology

## 2019-08-30 DIAGNOSIS — I700 Atherosclerosis of aorta: Secondary | ICD-10-CM | POA: Insufficient documentation

## 2019-08-30 DIAGNOSIS — M899 Disorder of bone, unspecified: Secondary | ICD-10-CM | POA: Insufficient documentation

## 2019-08-30 DIAGNOSIS — C7B00 Secondary carcinoid tumors, unspecified site: Secondary | ICD-10-CM | POA: Diagnosis not present

## 2019-08-30 DIAGNOSIS — R918 Other nonspecific abnormal finding of lung field: Secondary | ICD-10-CM | POA: Insufficient documentation

## 2019-08-30 DIAGNOSIS — Z90710 Acquired absence of both cervix and uterus: Secondary | ICD-10-CM | POA: Insufficient documentation

## 2019-08-30 DIAGNOSIS — N289 Disorder of kidney and ureter, unspecified: Secondary | ICD-10-CM | POA: Insufficient documentation

## 2019-08-30 DIAGNOSIS — C7A011 Malignant carcinoid tumor of the jejunum: Secondary | ICD-10-CM | POA: Diagnosis not present

## 2019-08-30 LAB — POCT I-STAT CREATININE: Creatinine, Ser: 1.1 mg/dL — ABNORMAL HIGH (ref 0.44–1.00)

## 2019-08-30 MED ORDER — IOHEXOL 300 MG/ML  SOLN
100.0000 mL | Freq: Once | INTRAMUSCULAR | Status: AC | PRN
  Administered 2019-08-30: 100 mL via INTRAVENOUS

## 2019-09-03 ENCOUNTER — Other Ambulatory Visit: Payer: Self-pay

## 2019-09-03 ENCOUNTER — Encounter: Payer: Self-pay | Admitting: Oncology

## 2019-09-03 NOTE — Progress Notes (Signed)
Patient stated that she had been having trouble going to sleep and staying asleep. Patient started taking Tylenol PM to help fall and stay asleep but she wanted the physician to know.

## 2019-09-06 ENCOUNTER — Inpatient Hospital Stay (HOSPITAL_BASED_OUTPATIENT_CLINIC_OR_DEPARTMENT_OTHER): Payer: Medicare Other | Admitting: Oncology

## 2019-09-06 ENCOUNTER — Inpatient Hospital Stay: Payer: Medicare Other | Attending: Oncology | Admitting: *Deleted

## 2019-09-06 ENCOUNTER — Other Ambulatory Visit: Payer: Self-pay

## 2019-09-06 VITALS — BP 141/69 | Temp 98.5°F | Ht 64.0 in | Wt 211.0 lb

## 2019-09-06 DIAGNOSIS — Z79899 Other long term (current) drug therapy: Secondary | ICD-10-CM | POA: Insufficient documentation

## 2019-09-06 DIAGNOSIS — Z87442 Personal history of urinary calculi: Secondary | ICD-10-CM | POA: Insufficient documentation

## 2019-09-06 DIAGNOSIS — E039 Hypothyroidism, unspecified: Secondary | ICD-10-CM | POA: Diagnosis not present

## 2019-09-06 DIAGNOSIS — I480 Paroxysmal atrial fibrillation: Secondary | ICD-10-CM | POA: Insufficient documentation

## 2019-09-06 DIAGNOSIS — C7B00 Secondary carcinoid tumors, unspecified site: Secondary | ICD-10-CM | POA: Diagnosis not present

## 2019-09-06 DIAGNOSIS — Z8673 Personal history of transient ischemic attack (TIA), and cerebral infarction without residual deficits: Secondary | ICD-10-CM | POA: Diagnosis not present

## 2019-09-06 DIAGNOSIS — R591 Generalized enlarged lymph nodes: Secondary | ICD-10-CM

## 2019-09-06 DIAGNOSIS — G4700 Insomnia, unspecified: Secondary | ICD-10-CM | POA: Diagnosis not present

## 2019-09-06 DIAGNOSIS — R197 Diarrhea, unspecified: Secondary | ICD-10-CM | POA: Insufficient documentation

## 2019-09-06 DIAGNOSIS — I272 Pulmonary hypertension, unspecified: Secondary | ICD-10-CM | POA: Diagnosis not present

## 2019-09-06 DIAGNOSIS — R918 Other nonspecific abnormal finding of lung field: Secondary | ICD-10-CM | POA: Insufficient documentation

## 2019-09-06 DIAGNOSIS — E669 Obesity, unspecified: Secondary | ICD-10-CM | POA: Diagnosis not present

## 2019-09-06 DIAGNOSIS — M810 Age-related osteoporosis without current pathological fracture: Secondary | ICD-10-CM | POA: Insufficient documentation

## 2019-09-06 DIAGNOSIS — Z7901 Long term (current) use of anticoagulants: Secondary | ICD-10-CM | POA: Diagnosis not present

## 2019-09-06 DIAGNOSIS — E785 Hyperlipidemia, unspecified: Secondary | ICD-10-CM | POA: Diagnosis not present

## 2019-09-06 DIAGNOSIS — E1122 Type 2 diabetes mellitus with diabetic chronic kidney disease: Secondary | ICD-10-CM | POA: Diagnosis not present

## 2019-09-06 DIAGNOSIS — I700 Atherosclerosis of aorta: Secondary | ICD-10-CM | POA: Diagnosis not present

## 2019-09-06 DIAGNOSIS — I129 Hypertensive chronic kidney disease with stage 1 through stage 4 chronic kidney disease, or unspecified chronic kidney disease: Secondary | ICD-10-CM | POA: Diagnosis not present

## 2019-09-06 DIAGNOSIS — R590 Localized enlarged lymph nodes: Secondary | ICD-10-CM | POA: Diagnosis not present

## 2019-09-06 LAB — CBC WITH DIFFERENTIAL/PLATELET
Abs Immature Granulocytes: 0.04 10*3/uL (ref 0.00–0.07)
Basophils Absolute: 0.1 10*3/uL (ref 0.0–0.1)
Basophils Relative: 1 %
Eosinophils Absolute: 0.1 10*3/uL (ref 0.0–0.5)
Eosinophils Relative: 1 %
HCT: 37.9 % (ref 36.0–46.0)
Hemoglobin: 11.6 g/dL — ABNORMAL LOW (ref 12.0–15.0)
Immature Granulocytes: 0 %
Lymphocytes Relative: 32 %
Lymphs Abs: 3.3 10*3/uL (ref 0.7–4.0)
MCH: 25.9 pg — ABNORMAL LOW (ref 26.0–34.0)
MCHC: 30.6 g/dL (ref 30.0–36.0)
MCV: 84.6 fL (ref 80.0–100.0)
Monocytes Absolute: 0.6 10*3/uL (ref 0.1–1.0)
Monocytes Relative: 6 %
Neutro Abs: 6.3 10*3/uL (ref 1.7–7.7)
Neutrophils Relative %: 60 %
Platelets: 323 10*3/uL (ref 150–400)
RBC: 4.48 MIL/uL (ref 3.87–5.11)
RDW: 16.7 % — ABNORMAL HIGH (ref 11.5–15.5)
WBC: 10.5 10*3/uL (ref 4.0–10.5)
nRBC: 0 % (ref 0.0–0.2)

## 2019-09-06 LAB — COMPREHENSIVE METABOLIC PANEL
ALT: 11 U/L (ref 0–44)
AST: 15 U/L (ref 15–41)
Albumin: 3.8 g/dL (ref 3.5–5.0)
Alkaline Phosphatase: 59 U/L (ref 38–126)
Anion gap: 10 (ref 5–15)
BUN: 15 mg/dL (ref 8–23)
CO2: 26 mmol/L (ref 22–32)
Calcium: 9.4 mg/dL (ref 8.9–10.3)
Chloride: 102 mmol/L (ref 98–111)
Creatinine, Ser: 1.16 mg/dL — ABNORMAL HIGH (ref 0.44–1.00)
GFR calc Af Amer: 50 mL/min — ABNORMAL LOW (ref >60)
GFR calc non Af Amer: 43 mL/min — ABNORMAL LOW (ref >60)
Glucose, Bld: 159 mg/dL — ABNORMAL HIGH (ref 70–99)
Potassium: 3.7 mmol/L (ref 3.5–5.1)
Sodium: 138 mmol/L (ref 135–145)
Total Bilirubin: 0.4 mg/dL (ref 0.3–1.2)
Total Protein: 7.4 g/dL (ref 6.5–8.1)

## 2019-09-07 LAB — CHROMOGRANIN A REBASELINE
Chromogranin A (ng/mL): 212.4 ng/mL — ABNORMAL HIGH (ref 0.0–101.8)
Chromogranin A: 23 nmol/L — ABNORMAL HIGH (ref 0–5)

## 2019-09-07 NOTE — Progress Notes (Signed)
Hematology/Oncology Consult note [facility]  Telephone:([phone] [phone]  Patient Care Team: Steele Sizer, MD as PCP - General (Family Medicine) Wellington Hampshire, MD as PCP - Cardiology (Cardiology) Sindy Guadeloupe, MD as Consulting Physician (Oncology) Lin Landsman, MD as Consulting Physician (Gastroenterology)   Name of the patient: Susan Bowen  [id number]  12/24/1931   Date of visit: 09/07/19  Diagnosis- intra abdominal adenopathy- metastatic carcinoid  Chief complaint/ Reason for visit-discuss CT scan results and further management  Heme/Onc history: Patient is a 85 year old female who underwent CT renal stone study for flank pain. CT scan showed pathologically enlarged lymph nodes in the right lower quadrant measuring up to 2.6 cm. Findings concerning for lymphoma or malignant process. Patient's past medical history significant for pulmonary hypertension, CKD, hypothyroidism among other medical problems. She did not have any B symptoms. This was followed by a PET CT scan Which showed 3 prominent soft tissue nodule/lymph nodes one in the mesentery at 2.1 cm with an SUV of 2.8. Adjacent soft tissue nodule/lymph node in the right iliac fossa measuring 3.1 cm and SUV of 2.8. Predominantly fat attenuation mesenteric nodule in the left lower quadrant measuring 2 cm without any significant FDG uptake  Interval history-patient has occasional bouts of diarrhea which lasts for 2 to 3 days and then she may not have diarrhea for the next 2 to 3 weeks.  Reports that these are often small volume watery stools.  Denies any abdominal pain.  Appetite and weight have remained stable.  ECOG PS- 1 Pain scale- 0 Opioid associated constipation- no  Review of systems- Review of Systems  Constitutional: Positive for malaise/fatigue. Negative for chills, fever and weight loss.  HENT: Negative for congestion, ear discharge and nosebleeds.   Eyes:  Negative for blurred vision.  Respiratory: Negative for cough, hemoptysis, sputum production, shortness of breath and wheezing.   Cardiovascular: Negative for chest pain, palpitations, orthopnea and claudication.  Gastrointestinal: Positive for diarrhea. Negative for abdominal pain, blood in stool, constipation, heartburn, melena, nausea and vomiting.  Genitourinary: Negative for dysuria, flank pain, frequency, hematuria and urgency.  Musculoskeletal: Negative for back pain, joint pain and myalgias.  Skin: Negative for rash.  Neurological: Negative for dizziness, tingling, focal weakness, seizures, weakness and headaches.  Endo/Heme/Allergies: Does not bruise/bleed easily.  Psychiatric/Behavioral: Negative for depression and suicidal ideas. The patient does not have insomnia.        Allergies  Allergen Reactions   Augmentin [Amoxicillin-Pot Clavulanate] Itching    Has patient had a PCN reaction causing immediate rash, facial/tongue/throat swelling, SOB or lightheadedness with hypotension: No Has patient had a PCN reaction causing severe rash involving mucus membranes or skin necrosis: No Has patient had a PCN reaction that required hospitalization: No Has patient had a PCN reaction occurring within the last 10 years: No If all of the above answers are "NO", then may proceed with Cephalosporin use.      Past Medical History:  Diagnosis Date   Allergy    Back spasm    Bunion    Carpal tunnel syndrome    Cataracta    Diabetes mellitus without complication ([place])    Generalized osteoarthritis    Gout    Hemorrhoids without complication    Hyperlipidemia    Hypertension    Hypothyroidism    Impingement syndrome of right shoulder    Insomnia    Lipoma    Lung nodule    Metastatic carcinoid tumor ([place]) 03/2016  Neuritis or radiculitis due to rupture of lumbar intervertebral disc    Obesity    Osteoporosis    PAF (paroxysmal atrial fibrillation) ([place])     a. 03/2018 AF w/ RVR-->converted spont.  CHA2DS2VASc = 7-->Eliquis.   Proteinuria    Rotator cuff tear    Systolic murmur    a. 07/4764 Echo: EF 60-65%, no rwma, Gr1 DD, mild MR, mildly dil LA. Nl RV fxn. PASP 54mHg.   TIA (transient ischemic attack)    TIA (transient ischemic attack) 1998   small TIA.  no residual effects   Tinnitus of both ears    Unspecified glaucoma(365.9)    Vitamin D deficiency    Wedge compression fracture of t11-T12 vertebra, sequela      Past Surgical History:  Procedure Laterality Date   ABDOMINAL HYSTERECTOMY  1986   APPENDECTOMY     BREAST SURGERY Bilateral 1983   biopsy of each side, negative   CARPAL TUNNEL RELEASE     CATARACT EXTRACTION W/PHACO Left 09/03/2017   Procedure: CATARACT EXTRACTION PHACO AND INTRAOCULAR LENS PLACEMENT (IOC)-LEFT DIABETIC;  Surgeon: PBirder Robson MD;  Location: [facility] ORS;  Service: Ophthalmology;  Laterality: Left;  [place]:10.2 AP% 16.7 CDE 11.71 Fluid Pack Lot # [id number]  CATARACT EXTRACTION W/PHACO Right 09/30/2017   Procedure: CATARACT EXTRACTION PHACO AND INTRAOCULAR LENS PLACEMENT (IOC);  Surgeon: PBirder Robson MD;  Location: [facility] ORS;  Service: Ophthalmology;  Laterality: Right;  [place]:35.1 AP% 13.1 CDE 12.44 Fluid pack Lot # [id number]   COLON SURGERY     EYE SURGERY Left 09/03/2017   Cataract extraction   FEMUR FRACTURE SURGERY Left    FRACTURE SURGERY  2001   left femur   HEMORROIDECTOMY     polyp removed from vocal cord      Social History   Socioeconomic History   Marital status: Divorced    Spouse name: Not on file   Number of children: 6   Years of education: Not on file   Highest education level: 10th grade  Occupational History   Not on file  Social Needs   Financial resource strain: Somewhat hard   Food insecurity    Worry: Never true    Inability: Never true   Transportation needs    Medical: No    Non-medical: No  Tobacco Use   Smoking status:  Never Smoker   Smokeless tobacco: Never Used  Substance and Sexual Activity   Alcohol use: No    Alcohol/week: 0.0 standard drinks   Drug use: No   Sexual activity: Not Currently  Lifestyle   Physical activity    Days per week: 7 days    Minutes per session: 30 min   Stress: Not at all  Relationships   Social connections    Talks on phone: More than three times a week    Gets together: Twice a week    Attends religious service: More than 4 times per year    Active member of club or organization: Yes    Attends meetings of clubs or organizations: 1 to 4 times per year    Relationship status: Divorced   Intimate partner violence    Fear of current or ex partner: No    Emotionally abused: No    Physically abused: No    Forced sexual activity: No  Other Topics Concern   Not on file  Social History Narrative   Not on file    Family History  Problem Relation Age of  Onset   Diabetes Mother    Hypertension Mother    Dementia Mother    Hypertension Son      Current Outpatient Medications:    acetaminophen (TYLENOL) 500 MG tablet, Take 500 mg by mouth every 6 (six) hours as needed., Disp: , Rfl:    allopurinol (ZYLOPRIM) 100 MG tablet, TAKE 1 TABLET (100 MG TOTAL) BY MOUTH 2 (TWO) TIMES DAILY. TO PREVENT GOUT, Disp: 180 tablet, Rfl: 0   Blood Glucose Monitoring Suppl (ACCU-CHEK AVIVA PLUS) w/Device KIT, 1 kit by Does not apply route 3 (three) times daily., Disp: 1 kit, Rfl: 0   carvedilol (COREG) 25 MG tablet, Take 1 tablet (25 mg total) by mouth 2 (two) times daily. New dose, Disp: 180 tablet, Rfl: 3   COMBIGAN 0.2-0.5 % ophthalmic solution, Place 1 drop into both eyes 2 (two) times daily., Disp: , Rfl:    Cyanocobalamin (B-12) 1000 MCG TABS, Take 1,000 mcg daily by mouth., Disp: , Rfl:    Dulaglutide (TRULICITY) 1.5 ZO/1.0RU SOPN, Inject 1.5 mg into the skin every Thursday., Disp: 12 pen, Rfl: 1   ELIQUIS 5 MG TABS tablet, TAKE 1 TABLET BY MOUTH TWICE A  DAY, Disp: 180 tablet, Rfl: 1   empagliflozin (JARDIANCE) 25 MG TABS tablet, Take 25 mg by mouth daily., Disp: 90 tablet, Rfl: 1   glucose blood (ACCU-CHEK AVIVA PLUS) test strip, Check fasting glucose once each morning, and check glucose as needed., Disp: 100 each, Rfl: 12   Lancets (ACCU-CHEK MULTICLIX) lancets, Use as instructed, Disp: 204 each, Rfl: 12   levothyroxine (SYNTHROID) 50 MCG tablet, Take 1 tablet (50 mcg total) by mouth daily before breakfast., Disp: 90 tablet, Rfl: 1   loratadine (CLARITIN) 10 MG tablet, TAKE 1 TABLET BY MOUTH EVERY DAY, Disp: 90 tablet, Rfl: 2   metFORMIN (GLUCOPHAGE) 850 MG tablet, Take 1 tablet (850 mg total) by mouth 2 (two) times daily., Disp: 180 tablet, Rfl: 1   olmesartan-hydrochlorothiazide (BENICAR HCT) 40-25 MG tablet, TAKE 1 TABLET BY MOUTH EVERY DAY, Disp: 90 tablet, Rfl: 0   pravastatin (PRAVACHOL) 40 MG tablet, TAKE 1 TABLET BY MOUTH EVERY DAY, Disp: 90 tablet, Rfl: 1   Travoprost, BAK Free, (TRAVATAN) 0.004 % SOLN ophthalmic solution, Place 1 drop into both eyes at bedtime., Disp: , Rfl:    traZODone (DESYREL) 50 MG tablet, TAKE 0.5-1 TABLETS (25-50 MG TOTAL) BY MOUTH AT BEDTIME AS NEEDED FOR SLEEP., Disp: 90 tablet, Rfl: 0   Vitamin D, Ergocalciferol, (DRISDOL) 1.25 MG (50000 UT) CAPS capsule, TAKE WEEKLY, Disp: 12 capsule, Rfl: 1   COLCRYS 0.6 MG tablet, TAKE 1-2 TABLETS (0.6-1.2 MG TOTAL) BY MOUTH DAILY AS NEEDED. FOR GOUT, NOT DAILY (Patient not taking: Reported on 09/03/2019), Disp: 90 tablet, Rfl: 0  Physical exam:  Vitals:   09/03/19 1736  BP: (!) 141/69  Temp: 98.5 F (36.9 C)  TempSrc: Oral  Weight: 211 lb (95.7 kg)  Height: _0  (1.626 m)   Physical Exam Constitutional:      General: She is not in acute distress. HENT:     Head: Normocephalic and atraumatic.  Eyes:     Pupils: Pupils are equal, round, and reactive to light.  Neck:     Musculoskeletal: Normal range of motion.  Cardiovascular:     Rate and  Rhythm: Normal rate and regular rhythm.     Heart sounds: Normal heart sounds.  Pulmonary:     Effort: Pulmonary effort is normal.     Breath sounds:  Normal breath sounds.  Abdominal:     General: Bowel sounds are normal.     Palpations: Abdomen is soft.  Skin:    General: Skin is warm and dry.  Neurological:     Mental Status: She is alert and oriented to person, place, and time.      CMP Latest Ref Rng & Units 09/06/2019  Glucose 70 - 99 mg/dL 159(H)  BUN 8 - 23 mg/dL 15  Creatinine 0.44 - 1.00 mg/dL 1.16(H)  Sodium 135 - 145 mmol/L 138  Potassium 3.5 - 5.1 mmol/L 3.7  Chloride 98 - 111 mmol/L 102  CO2 22 - 32 mmol/L 26  Calcium 8.9 - 10.3 mg/dL 9.4  Total Protein 6.5 - 8.1 g/dL 7.4  Total Bilirubin 0.3 - 1.2 mg/dL 0.4  Alkaline Phos 38 - 126 U/L 59  AST 15 - 41 U/L 15  ALT 0 - 44 U/L 11   CBC Latest Ref Rng & Units 09/06/2019  WBC 4.0 - 10.5 K/uL 10.5  Hemoglobin 12.0 - 15.0 g/dL 11.6(L)  Hematocrit 36.0 - 46.0 % 37.9  Platelets 150 - 400 K/uL 323    No images are attached to the encounter.  Ct Chest W Contrast  Result Date: 08/30/2019 CLINICAL DATA:  85 year old female with history of carcinoid tumor of the small bowel mesentery diagnosed in May 2020. Follow-up study. EXAM: CT CHEST, ABDOMEN, AND PELVIS WITH CONTRAST TECHNIQUE: Multidetector CT imaging of the chest, abdomen and pelvis was performed following the standard protocol during bolus administration of intravenous contrast. CONTRAST:  139m OMNIPAQUE IOHEXOL 300 MG/ML  SOLN COMPARISON:  Dotatate PET-CT 04/29/2019. CT the abdomen and pelvis 03/26/2019. Chest CT 07/19/2016. FINDINGS: CT CHEST FINDINGS Cardiovascular: Heart size is borderline enlarged. There is no significant pericardial fluid, thickening or pericardial calcification. Aortic atherosclerosis. No definite coronary artery calcifications. Dilatation of the pulmonic trunk (3.6 cm in diameter). Mediastinum/Nodes: No pathologically enlarged mediastinal or  hilar lymph nodes. Small hiatal hernia. No axillary lymphadenopathy. Lungs/Pleura: Several scattered 2-3 mm pulmonary nodules, stable in size, number and distribution compared to the prior study from 2017, considered definitively benign. No other larger more suspicious appearing pulmonary nodules or masses are noted. No acute consolidative airspace disease. No pleural effusions. Musculoskeletal: A few well-defined sclerotic lesions with narrow zones of transition in the left ribs, similar to prior study from 2017, presumably benign bone islands. There are no other aggressive appearing lytic or blastic lesions noted in the visualized portions of the skeleton. CT ABDOMEN PELVIS FINDINGS Hepatobiliary: No suspicious cystic or solid hepatic lesions. No intra or extrahepatic biliary ductal dilatation. Gallbladder is normal in appearance. Pancreas: No pancreatic mass. No pancreatic ductal dilatation. No pancreatic or peripancreatic fluid collections or inflammatory changes. Spleen: Unremarkable. Adrenals/Urinary Tract: Subcentimeter low-attenuation lesions in both kidneys, too small to definitively characterize, but statistically likely to represent tiny cysts. No hydroureteronephrosis. Urinary bladder is normal in appearance. Bilateral adrenal glands are normal in appearance. Stomach/Bowel: Normal appearance of the stomach. No pathologic dilatation of small bowel or colon. The appendix is not confidently identified and may be surgically absent. Regardless, there are no inflammatory changes noted adjacent to the cecum to suggest the presence of an acute appendicitis at this time. Vascular/Lymphatic: Aortic atherosclerosis, without evidence of aneurysm or dissection in the abdominal or pelvic vasculature. No lymphadenopathy noted in the abdomen or pelvis. Reproductive: Status post hysterectomy. Ovaries are not confidently identified may be surgically absent or atrophic. Other: 2 enhancing soft tissue attenuation nodules  are again noted in  the small bowel mesentery, measuring 3.2 x 2.9 x 3.1 cm (axial image 85 of series 2 and coronal image 59 of series 4), and 2.3 x 2.4 x 2.4 cm (axial image 81 of series 2 and coronal image 55 of series 4). No other definite enhancing soft tissue nodules are noted in the abdomen or pelvis. No significant volume of ascites. No pneumoperitoneum. Musculoskeletal: There are no aggressive appearing lytic or blastic lesions noted in the visualized portions of the skeleton. IMPRESSION: 1. Two enhancing soft tissue attenuation nodules in the small bowel mesentery corresponding to areas of hypermetabolism noted on recent Dotatate PET-CT, which presumably reflect the patient's known carcinoid tumors. No signs of distant metastatic disease elsewhere in the chest, abdomen or pelvis. 2. Dilatation of the pulmonic trunk (3.6 cm in diameter), concerning for pulmonary arterial hypertension. 3. Several scattered tiny 2-3 mm pulmonary nodules in the lungs bilaterally, stable in size and number dating back to 2017, considered definitively benign requiring no future imaging follow-up. 4. Aortic atherosclerosis. 5. Additional incidental findings, as above. Electronically Signed   By: Vinnie Langton M.D.   On: 08/30/2019 10:48   Ct Abdomen Pelvis W Contrast  Result Date: 08/30/2019 CLINICAL DATA:  85 year old female with history of carcinoid tumor of the small bowel mesentery diagnosed in May 2020. Follow-up study. EXAM: CT CHEST, ABDOMEN, AND PELVIS WITH CONTRAST TECHNIQUE: Multidetector CT imaging of the chest, abdomen and pelvis was performed following the standard protocol during bolus administration of intravenous contrast. CONTRAST:  176m OMNIPAQUE IOHEXOL 300 MG/ML  SOLN COMPARISON:  [facility] PET-CT 04/29/2019. CT the abdomen and pelvis 03/26/2019. Chest CT 07/19/2016. FINDINGS: CT CHEST FINDINGS Cardiovascular: Heart size is borderline enlarged. There is no significant pericardial fluid, thickening or  pericardial calcification. Aortic atherosclerosis. No definite coronary artery calcifications. Dilatation of the pulmonic trunk (3.6 cm in diameter). Mediastinum/Nodes: No pathologically enlarged mediastinal or hilar lymph nodes. Small hiatal hernia. No axillary lymphadenopathy. Lungs/Pleura: Several scattered 2-3 mm pulmonary nodules, stable in size, number and distribution compared to the prior study from 2017, considered definitively benign. No other larger more suspicious appearing pulmonary nodules or masses are noted. No acute consolidative airspace disease. No pleural effusions. Musculoskeletal: A few well-defined sclerotic lesions with narrow zones of transition in the left ribs, similar to prior study from 2017, presumably benign bone islands. There are no other aggressive appearing lytic or blastic lesions noted in the visualized portions of the skeleton. CT ABDOMEN PELVIS FINDINGS Hepatobiliary: No suspicious cystic or solid hepatic lesions. No intra or extrahepatic biliary ductal dilatation. Gallbladder is normal in appearance. Pancreas: No pancreatic mass. No pancreatic ductal dilatation. No pancreatic or peripancreatic fluid collections or inflammatory changes. Spleen: Unremarkable. Adrenals/Urinary Tract: Subcentimeter low-attenuation lesions in both kidneys, too small to definitively characterize, but statistically likely to represent tiny cysts. No hydroureteronephrosis. Urinary bladder is normal in appearance. Bilateral adrenal glands are normal in appearance. Stomach/Bowel: Normal appearance of the stomach. No pathologic dilatation of small bowel or colon. The appendix is not confidently identified and may be surgically absent. Regardless, there are no inflammatory changes noted adjacent to the cecum to suggest the presence of an acute appendicitis at this time. Vascular/Lymphatic: Aortic atherosclerosis, without evidence of aneurysm or dissection in the abdominal or pelvic vasculature. No  lymphadenopathy noted in the abdomen or pelvis. Reproductive: Status post hysterectomy. Ovaries are not confidently identified may be surgically absent or atrophic. Other: 2 enhancing soft tissue attenuation nodules are again noted in the small bowel mesentery, measuring 3.2 x 2.9  x 3.1 cm (axial image 85 of series 2 and coronal image 59 of series 4), and 2.3 x 2.4 x 2.4 cm (axial image 81 of series 2 and coronal image 55 of series 4). No other definite enhancing soft tissue nodules are noted in the abdomen or pelvis. No significant volume of ascites. No pneumoperitoneum. Musculoskeletal: There are no aggressive appearing lytic or blastic lesions noted in the visualized portions of the skeleton. IMPRESSION: 1. Two enhancing soft tissue attenuation nodules in the small bowel mesentery corresponding to areas of hypermetabolism noted on recent Dotatate PET-CT, which presumably reflect the patient's known carcinoid tumors. No signs of distant metastatic disease elsewhere in the chest, abdomen or pelvis. 2. Dilatation of the pulmonic trunk (3.6 cm in diameter), concerning for pulmonary arterial hypertension. 3. Several scattered tiny 2-3 mm pulmonary nodules in the lungs bilaterally, stable in size and number dating back to 2017, considered definitively benign requiring no future imaging follow-up. 4. Aortic atherosclerosis. 5. Additional incidental findings, as above. Electronically Signed   By: Vinnie Langton M.D.   On: 08/30/2019 10:48     Assessment and plan- Patient is a 85 y.o. female with twos soft tissue small bowel nodules probably carcinoid  Soft tissue nodules in this vicinity of the small bowel could not be biopsied given high risk of bowel perforation with biopsy.  Dotatate scan did show intense uptake in these nodules probably consistent with carcinoid.  We have been monitoring these nodules now for the last 3 months and there has been no significant increase in the size of these nodules or new  evidence of metastatic disease.    Given her age and lack of symptoms I am inclined to monitor this conservatively with a repeat CT in 6 months time.  She does have intermittent diarrhea which is few and far in between and it is difficult to say if this is coming from the possible carcinoid.  Explained to the patient and her daughter that medications like octreotide which are used in the treatment of carcinoid help with symptoms but did not cause significant tumor shrinkage as such.  They verbalized understanding and are agreeable for continued monitoring at this time   Visit Diagnosis 1. Metastatic carcinoid tumor ([place])   2. Lymphadenopathy   3. Diarrhea, unspecified type      Dr. Randa Evens, MD, MPH [facility] [phone] 09/07/2019 1:05 PM

## 2019-09-08 ENCOUNTER — Encounter: Payer: Self-pay | Admitting: Family Medicine

## 2019-09-08 DIAGNOSIS — C7A00 Malignant carcinoid tumor of unspecified site: Secondary | ICD-10-CM | POA: Insufficient documentation

## 2019-09-10 ENCOUNTER — Ambulatory Visit (INDEPENDENT_AMBULATORY_CARE_PROVIDER_SITE_OTHER): Payer: Medicare Other | Admitting: Family Medicine

## 2019-09-10 ENCOUNTER — Encounter: Payer: Self-pay | Admitting: Family Medicine

## 2019-09-10 ENCOUNTER — Other Ambulatory Visit: Payer: Self-pay

## 2019-09-10 VITALS — BP 140/70 | HR 82 | Temp 96.8°F | Resp 16 | Ht 64.0 in | Wt 211.5 lb

## 2019-09-10 DIAGNOSIS — E890 Postprocedural hypothyroidism: Secondary | ICD-10-CM

## 2019-09-10 DIAGNOSIS — E785 Hyperlipidemia, unspecified: Secondary | ICD-10-CM | POA: Diagnosis not present

## 2019-09-10 DIAGNOSIS — Z23 Encounter for immunization: Secondary | ICD-10-CM | POA: Diagnosis not present

## 2019-09-10 DIAGNOSIS — I480 Paroxysmal atrial fibrillation: Secondary | ICD-10-CM | POA: Diagnosis not present

## 2019-09-10 DIAGNOSIS — I10 Essential (primary) hypertension: Secondary | ICD-10-CM

## 2019-09-10 DIAGNOSIS — E1169 Type 2 diabetes mellitus with other specified complication: Secondary | ICD-10-CM

## 2019-09-10 DIAGNOSIS — E1121 Type 2 diabetes mellitus with diabetic nephropathy: Secondary | ICD-10-CM

## 2019-09-10 DIAGNOSIS — I700 Atherosclerosis of aorta: Secondary | ICD-10-CM | POA: Diagnosis not present

## 2019-09-10 DIAGNOSIS — E559 Vitamin D deficiency, unspecified: Secondary | ICD-10-CM

## 2019-09-10 LAB — POCT GLYCOSYLATED HEMOGLOBIN (HGB A1C): Hemoglobin A1C: 7.5 % — AB (ref 4.0–5.6)

## 2019-09-10 MED ORDER — CARVEDILOL 25 MG PO TABS: 25.0000 mg | ORAL_TABLET | Freq: Two times a day (BID) | ORAL | 3 refills | Status: DC

## 2019-09-10 NOTE — Progress Notes (Signed)
Name: Susan Bowen   MRN: [id number]    DOB: 1931-11-24   Date:09/10/2019       Progress Note  Subjective  Chief Complaint  Chief Complaint  Patient presents with  . Diabetes  . Hypertension  . Hypothyroidism  . Gout  . Dyslipidemia    HPI  DMII with microalbuminuria; she is taking medications,ARB and statin therapy. FSBS at home has been130-170's She has occasional polyphagia, polydipsia but nopolyuria. Her hgbA1C was 10.5%, dropped to 8.5% 7.8% and today 7.5%  - that is good control for her based on her age She is still following a diabetic diet most of the time.She has been taking Metformin twice daily, Trulicity, Jardiance and is doing well - GFR still above 45. Eye exam is up to date, she states sugar goes from 130's to 200's depending on her diet   HTN: she has been taking medication. No chest painvery seldom haspalpitation, she states symptoms improved since seen by Dr. Fletcher Anon and coreg dose increased to 25 mg BID. She denies dizziness.BP is at goal. She is also on Benicar HCTZ and explained HCTZ can cause gout to flare, but no episodes in a long time   Afib : under the care of Dr. Fletcher Anon, rate controlled, she has  sob with activity but no  palpitation On Eliquis   Hyperlipidemia: taking Pravastatin and denies side effects of medications, no myalgia.Recheck labs yearly   Pulmonary nodule: seen by Dr. Raul Del, lung nodule stableand finished CT in 2017 . She hasyearly follow ups She had an echo done that showed Tricuspid regurgitation and LVH. Seen by Dr. Fletcher Anon, negative stress test.She is doing well at this time, off inhalers.She has OSA and Dr. Raul Del advised to use noctunal oxygen, she was unable to afford oxygen when off Medicaid, advise to try to re- apply for it.She missed last appointment with Dr. Raul Del because she felt like it was a waste of time. She states depends on children to drive her around. Unchanged   Carcinoid tumor with metastasis:  incidental finding on CT abdomen done for evaluation of renal stone back in 03/26/2019, it showed retroperitoneal lymphadenopathy, she has since seen Dr. Janese Banks and has been diagnosed with carcinoid tumor with metastasis and going to have watchful waiting. since she has been asymptomatic , reviewed her last note and unchanged.   Goiter: s/p thyroid ablation, Summer 2016. Seeing Dr. Gabriel Carina, she is on Levothyroxine 50  mcg daily, last TSH was at goal 07/13/2019 and Dr. Gabriel Carina released her to see me   Insomnia: she is taking Trazodone   Morbid obesity: BMI above 35 with co-morbidities ( DM, HTN, gout) she is also 60 and has co-morbidities, because of her age advised to not gain anymore   Contusion of right lower tibia: a hammer fell on right tibia about 4 months ago developed a hematoma, still has soreness but states improving, denies problems walking, advised to try warm compresses and massage the are to improve absorption, if gets worse or no resolution we can check x-ray   CKI stage III: stable, no pruritis. Good urine output . We will recheck labs next visit   Patient Active Problem List   Diagnosis Date Noted  . Carcinoid tumor, malignant ([place]) 09/08/2019  . Pain in joint of left shoulder 02/25/2019  . Pulmonary hypertension, unspecified ([place]) 12/28/2018  . Paroxysmal A-fib ([place]) 12/28/2018  . Chronic kidney disease, stage III (moderate) 04/01/2018  . Elevated parathyroid hormone 01/01/2018  . Elevated uric acid in blood  04/29/2017  . Plantar fasciitis of left foot 03/12/2016  . Postprocedural hypothyroidism 01/23/2016  . Aortic stenosis 09/26/2015  . Allergic rhinitis, seasonal 08/08/2015  . Benign hypertension 08/08/2015  . History of pneumonia 08/08/2015  . Carpal tunnel syndrome 08/08/2015  . Cataract 08/08/2015  . Insomnia, persistent 08/08/2015  . Dyslipidemia 08/08/2015  . History of depression 08/08/2015  . Glaucoma 08/08/2015  . Controlled gout 08/08/2015  . Fever blister  08/08/2015  . Bilateral hearing loss 08/08/2015  . Hemorrhoid 08/08/2015  . H/O iron deficiency anemia 08/08/2015  . Benign neoplasm of colon 08/08/2015  . Impingement syndrome of shoulder 08/08/2015  . Osteoporosis, post-menopausal 08/08/2015  . Morbid obesity ([place]) 08/08/2015  . Generalized OA 08/08/2015  . Multinodular goiter 08/08/2015  . Asymptomatic varicose veins 08/08/2015  . Polyp of vocal cord 08/08/2015  . History of vertebral compression fracture 08/08/2015  . Diabetes mellitus with proteinuric diabetic nephropathy ([place]) 08/08/2015  . LVH (left ventricular hypertrophy) 08/08/2015  . Moderate tricuspid regurgitation 08/08/2015  . History of radioactive iodine thyroid ablation 08/08/2015  . Lung nodule, solitary 05/12/2014  . Chronic cough 05/11/2014  . Vitamin D deficiency 12/20/2009  . Plantar fascial fibromatosis 12/20/2009  . Personal history of fall 07/20/2008  . Cervical radiculitis 05/11/2008    Past Surgical History:  Procedure Laterality Date  . ABDOMINAL HYSTERECTOMY  1986  . APPENDECTOMY    . BREAST SURGERY Bilateral 1983   biopsy of each side, negative  . CARPAL TUNNEL RELEASE    . CATARACT EXTRACTION W/PHACO Left 09/03/2017   Procedure: CATARACT EXTRACTION PHACO AND INTRAOCULAR LENS PLACEMENT (IOC)-LEFT DIABETIC;  Surgeon: Birder Robson, MD;  Location: [facility] ORS;  Service: Ophthalmology;  Laterality: Left;  [place] 01:10.2 AP% 16.7 CDE 11.71 Fluid Pack Lot # [id number]  . CATARACT EXTRACTION W/PHACO Right 09/30/2017   Procedure: CATARACT EXTRACTION PHACO AND INTRAOCULAR LENS PLACEMENT (IOC);  Surgeon: Birder Robson, MD;  Location: [facility] ORS;  Service: Ophthalmology;  Laterality: Right;  [place] 01:35.1 AP% 13.1 CDE 12.44 Fluid pack Lot # [id number]  . COLON SURGERY    . EYE SURGERY Left 09/03/2017   Cataract extraction  . FEMUR FRACTURE SURGERY Left   . FRACTURE SURGERY  2001   left femur  . HEMORROIDECTOMY    . polyp removed from vocal cord       Family History  Problem Relation Age of Onset  . Diabetes Mother   . Hypertension Mother   . Dementia Mother   . Hypertension Son     Social History   Socioeconomic History  . Marital status: Divorced    Spouse name: Not on file  . Number of children: 6  . Years of education: Not on file  . Highest education level: 10th grade  Occupational History  . Not on file  Social Needs  . Financial resource strain: Somewhat hard  . Food insecurity    Worry: Never true    Inability: Never true  . Transportation needs    Medical: No    Non-medical: No  Tobacco Use  . Smoking status: Never Smoker  . Smokeless tobacco: Never Used  Substance and Sexual Activity  . Alcohol use: No    Alcohol/week: 0.0 standard drinks  . Drug use: No  . Sexual activity: Not Currently  Lifestyle  . Physical activity    Days per week: 7 days    Minutes per session: 30 min  . Stress: Not at all  Relationships  . Social Herbalist on  phone: More than three times a week    Gets together: Twice a week    Attends religious service: More than 4 times per year    Active member of club or organization: Yes    Attends meetings of clubs or organizations: 1 to 4 times per year    Relationship status: Divorced  . Intimate partner violence    Fear of current or ex partner: No    Emotionally abused: No    Physically abused: No    Forced sexual activity: No  Other Topics Concern  . Not on file  Social History Narrative  . Not on file     Current Outpatient Medications:  .  acetaminophen (TYLENOL) 500 MG tablet, Take 500 mg by mouth every 6 (six) hours as needed., Disp: , Rfl:  .  allopurinol (ZYLOPRIM) 100 MG tablet, TAKE 1 TABLET (100 MG TOTAL) BY MOUTH 2 (TWO) TIMES DAILY. TO PREVENT GOUT, Disp: 180 tablet, Rfl: 0 .  Blood Glucose Monitoring Suppl (ACCU-CHEK AVIVA PLUS) w/Device KIT, 1 kit by Does not apply route 3 (three) times daily., Disp: 1 kit, Rfl: 0 .  carvedilol (COREG) 25 MG  tablet, Take 1 tablet (25 mg total) by mouth 2 (two) times daily. New dose, Disp: 180 tablet, Rfl: 3 .  COLCRYS 0.6 MG tablet, TAKE 1-2 TABLETS (0.6-1.2 MG TOTAL) BY MOUTH DAILY AS NEEDED. FOR GOUT, NOT DAILY, Disp: 90 tablet, Rfl: 0 .  COMBIGAN 0.2-0.5 % ophthalmic solution, Place 1 drop into both eyes 2 (two) times daily., Disp: , Rfl:  .  Cyanocobalamin (B-12) 1000 MCG TABS, Take 1,000 mcg daily by mouth., Disp: , Rfl:  .  Dulaglutide (TRULICITY) 1.5 ZE/0.9QZ SOPN, Inject 1.5 mg into the skin every Thursday., Disp: 12 pen, Rfl: 1 .  ELIQUIS 5 MG TABS tablet, TAKE 1 TABLET BY MOUTH TWICE A DAY, Disp: 180 tablet, Rfl: 1 .  empagliflozin (JARDIANCE) 25 MG TABS tablet, Take 25 mg by mouth daily., Disp: 90 tablet, Rfl: 1 .  glucose blood (ACCU-CHEK AVIVA PLUS) test strip, Check fasting glucose once each morning, and check glucose as needed., Disp: 100 each, Rfl: 12 .  Lancets (ACCU-CHEK MULTICLIX) lancets, Use as instructed, Disp: 204 each, Rfl: 12 .  levothyroxine (SYNTHROID) 50 MCG tablet, Take 1 tablet (50 mcg total) by mouth daily before breakfast., Disp: 90 tablet, Rfl: 1 .  loratadine (CLARITIN) 10 MG tablet, TAKE 1 TABLET BY MOUTH EVERY DAY, Disp: 90 tablet, Rfl: 2 .  metFORMIN (GLUCOPHAGE) 850 MG tablet, Take 1 tablet (850 mg total) by mouth 2 (two) times daily., Disp: 180 tablet, Rfl: 1 .  olmesartan-hydrochlorothiazide (BENICAR HCT) 40-25 MG tablet, TAKE 1 TABLET BY MOUTH EVERY DAY, Disp: 90 tablet, Rfl: 0 .  pravastatin (PRAVACHOL) 40 MG tablet, TAKE 1 TABLET BY MOUTH EVERY DAY, Disp: 90 tablet, Rfl: 1 .  Travoprost, BAK Free, (TRAVATAN) 0.004 % SOLN ophthalmic solution, Place 1 drop into both eyes at bedtime., Disp: , Rfl:  .  traZODone (DESYREL) 50 MG tablet, TAKE 0.5-1 TABLETS (25-50 MG TOTAL) BY MOUTH AT BEDTIME AS NEEDED FOR SLEEP., Disp: 90 tablet, Rfl: 0 .  Vitamin D, Ergocalciferol, (DRISDOL) 1.25 MG (50000 UT) CAPS capsule, TAKE WEEKLY, Disp: 12 capsule, Rfl: 1  Allergies   Allergen Reactions  . Augmentin [Amoxicillin-Pot Clavulanate] Itching    Has patient had a PCN reaction causing immediate rash, facial/tongue/throat swelling, SOB or lightheadedness with hypotension: No Has patient had a PCN reaction causing severe rash involving mucus membranes  or skin necrosis: No Has patient had a PCN reaction that required hospitalization: No Has patient had a PCN reaction occurring within the last 10 years: No If all of the above answers are "NO", then may proceed with Cephalosporin use.     I personally reviewed active problem list, medication list, allergies, family history, social history, health maintenance with the patient/caregiver today.   ROS  Constitutional: Negative for fever or weight change.  Respiratory: Negative for cough and shortness of breath.   Cardiovascular: Negative for chest pain or palpitations.  Gastrointestinal: Negative for abdominal pain, no bowel changes.  Musculoskeletal: Negative for gait problem or joint swelling.  Skin: Negative for rash.  Neurological: Negative for dizziness or headache.  No other specific complaints in a complete review of systems (except as listed in HPI above).   Objective  Vitals:   09/10/19 1118  BP: 140/70  Pulse: 82  Resp: 16  Temp: (!) 96.8 F (36 C)  TempSrc: Temporal  SpO2: 95%  Weight: 211 lb 8 oz (95.9 kg)  Height: _0  (1.626 m)    Body mass index is 36.3 kg/m.  Physical Exam  Constitutional: Patient appears well-developed and well-nourished. Obese  No distress.  HEENT: head atraumatic, normocephalic, pupils equal and reactive to light Cardiovascular: Normal rate, regular rhythm and normal heart sounds.  No murmur heard. No BLE edema. Pulmonary/Chest: Effort normal and breath sounds normal. No respiratory distress. Abdominal: Soft.  There is no tenderness. Psychiatric: Patient has a normal mood and affect. behavior is normal. Judgment and thought content normal. Leg: area of mild  erythema on anterior tibia, tender to touch, she states area of hematoma in the past, but not as bad, took antibiotics in the past given by urgent care   Recent Results (from the past 2160 hour(s))  HM DIABETES EYE EXAM     Status: None   Collection Time: 06/30/19 12:00 AM  Result Value Ref Range   HM Diabetic Eye Exam No Retinopathy No Retinopathy  I-STAT creatinine     Status: Abnormal   Collection Time: 08/30/19  9:37 AM  Result Value Ref Range   Creatinine, Ser 1.10 (H) 0.44 - 1.00 mg/dL  Comprehensive metabolic panel     Status: Abnormal   Collection Time: 09/06/19 11:03 AM  Result Value Ref Range   Sodium 138 135 - 145 mmol/L   Potassium 3.7 3.5 - 5.1 mmol/L   Chloride 102 98 - 111 mmol/L   CO2 26 22 - 32 mmol/L   Glucose, Bld 159 (H) 70 - 99 mg/dL   BUN 15 8 - 23 mg/dL   Creatinine, Ser 1.16 (H) 0.44 - 1.00 mg/dL   Calcium 9.4 8.9 - 10.3 mg/dL   Total Protein 7.4 6.5 - 8.1 g/dL   Albumin 3.8 3.5 - 5.0 g/dL   AST 15 15 - 41 U/L   ALT 11 0 - 44 U/L   Alkaline Phosphatase 59 38 - 126 U/L   Total Bilirubin 0.4 0.3 - 1.2 mg/dL   GFR calc non Af Amer 43 (L) >60 mL/min   GFR calc Af Amer 50 (L) >60 mL/min   Anion gap 10 5 - 15    Comment: Performed at [facility], [address] [place], [place] [address]  CBC with Differential/Platelet     Status: Abnormal   Collection Time: 09/06/19 11:03 AM  Result Value Ref Range   WBC 10.5 4.0 - 10.5 K/uL   RBC 4.48 3.87 - 5.11 MIL/uL  Hemoglobin 11.6 (L) 12.0 - 15.0 g/dL   HCT 37.9 36.0 - 46.0 %   MCV 84.6 80.0 - 100.0 fL   MCH 25.9 (L) 26.0 - 34.0 pg   MCHC 30.6 30.0 - 36.0 g/dL   RDW 16.7 (H) 11.5 - 15.5 %   Platelets 323 150 - 400 K/uL   nRBC 0.0 0.0 - 0.2 %   Neutrophils Relative % 60 %   Neutro Abs 6.3 1.7 - 7.7 K/uL   Lymphocytes Relative 32 %   Lymphs Abs 3.3 0.7 - 4.0 K/uL   Monocytes Relative 6 %   Monocytes Absolute 0.6 0.1 - 1.0 K/uL   Eosinophils Relative 1 %   Eosinophils Absolute 0.1 0.0 - 0.5 K/uL    Basophils Relative 1 %   Basophils Absolute 0.1 0.0 - 0.1 K/uL   Immature Granulocytes 0 %   Abs Immature Granulocytes 0.04 0.00 - 0.07 K/uL    Comment: Performed at [facility], [address] [place], [place] [address]  Chromogranin A Rebaseline     Status: Abnormal   Collection Time: 09/06/19 12:00 PM  Result Value Ref Range   Chromogranin A 23 (H) 0 - 5 nmol/L    Comment: (NOTE) Results of this test are labeled for research purposes only by the assay's manufacturer. The performance characteristics of this assay have not been established by the manufacturer. The result should not be used for treatment or for diagnostic purposes without confirmation of the diagnosis by another medically established diagnostic product or procedure. The performance characteristics were determined by [facility]. Chromogranin A performed by [facility] methodology. Values obtained with different assay methods or kits cannot be used interchangeably.    Chromogranin A (ng/mL) 212.4 (H) 0.0 - 101.8 ng/mL    Comment: (NOTE) Results of this test are labeled for research purposes only by the assay's manufacturer. The performance characteristics of this assay have not been established by the manufacturer. The result should not be used for treatment or for diagnostic purposes without confirmation of the diagnosis by another medically established diagnostic product or procedure. The performance characteristics were determined by [facility]. Chromogranin A performed by [employer] methodology Values obtained with different assay methods or kits cannot be used interchangeably. Performed At: [facility] [place], [place] [id number] Rush Farmer MD [id number]   POCT HgB A1C     Status: Abnormal   Collection Time: 09/10/19 11:24 AM  Result Value Ref Range   Hemoglobin A1C 7.5 (A) 4.0 - 5.6 %   HbA1c POC (<> result, manual entry)     HbA1c, POC (prediabetic  range)     HbA1c, POC (controlled diabetic range)        PHQ2/9: Depression screen [facility] 2/9 09/10/2019 05/10/2019 03/25/2019 12/28/2018 08/03/2018  Decreased Interest 0 0 0 0 0  Down, Depressed, Hopeless 0 0 0 0 0  PHQ - 2 Score 0 0 0 0 0  Altered sleeping 0 - 0 0 2  Tired, decreased energy 0 - 0 0 2  Change in appetite 0 - 0 0 0  Feeling bad or failure about yourself  0 - 0 0 0  Trouble concentrating 0 - 0 0 0  Moving slowly or fidgety/restless 0 - 0 0 0  Suicidal thoughts 0 - 0 0 0  PHQ-9 Score 0 - 0 0 4  Difficult doing work/chores - - Not difficult at all Not difficult at all Not difficult at all    phq 9 is negative  Fall Risk: Fall Risk  09/10/2019 05/10/2019 03/25/2019 12/28/2018 08/03/2018  Falls in the past year? 0 0 0 0 No  Number falls in past yr: 0 0 0 0 -  Injury with Fall? 0 0 0 0 -  Follow up - - - Falls evaluation completed -     Functional Status Survey: Is the patient deaf or have difficulty hearing?: No Does the patient have difficulty seeing, even when wearing glasses/contacts?: Yes Does the patient have difficulty concentrating, remembering, or making decisions?: No Does the patient have difficulty walking or climbing stairs?: No Does the patient have difficulty dressing or bathing?: No Does the patient have difficulty doing errands alone such as visiting a doctor's office or shopping?: No   Assessment & Plan  1. Diabetes mellitus with proteinuric diabetic nephropathy (HCC)  - POCT HgB A1C  2. Need for immunization against influenza  - Flu high dose  3. Benign hypertension  - carvedilol (COREG) 25 MG tablet; Take 1 tablet (25 mg total) by mouth 2 (two) times daily. New dose  Dispense: 180 tablet; Refill: 3  4. Dyslipidemia  On statin therapy   5. Dyslipidemia associated with type 2 diabetes mellitus (HCC)  Continue medications  6. Atherosclerosis of aorta ([place])  On statin therapy   7. Paroxysmal A-fib (HCC)  Rate controlled and regular  rhythm today   8. Morbid obesity ([place])  Advised to keep to try not to gain, but because of age and co-morbidities try not to lose   9. Hypothyroidism, postablative   10. Vitamin D deficiency  Continue supplementation

## 2019-09-11 ENCOUNTER — Other Ambulatory Visit: Payer: Self-pay | Admitting: Family Medicine

## 2019-09-11 DIAGNOSIS — M109 Gout, unspecified: Secondary | ICD-10-CM

## 2019-09-17 ENCOUNTER — Ambulatory Visit: Payer: Medicare Other

## 2019-09-17 NOTE — Chronic Care Management (AMB) (Signed)
  Chronic Care Management   Note  09/17/2019 Name: Susan Bowen MRN: [id number] DOB: 1932-08-29    Successful outreach with Susan Bowen. The primary focus of the conversation was to discuss the Chronic Care Management program and available services. She is agreeable to outreach and completing the initial intake assessment within the next few weeks.  Follow up plan: -Outreach scheduled for 10/08/19 -Susan Bowen was provided with contact information for the care management team and encourged to call with any health related questions or concerns.     [facility] Care Management [phone]

## 2019-10-08 ENCOUNTER — Telehealth: Payer: Self-pay

## 2019-10-08 ENCOUNTER — Ambulatory Visit: Payer: Self-pay

## 2019-10-08 NOTE — Chronic Care Management (AMB) (Signed)
  Chronic Care Management   Outreach Note  10/08/2019 Name: Susan Bowen MRN: [id number] DOB: 1932-10-13  Primary Care Provider: Steele Sizer, MD Reason for referral : Chronic Care Management   An unsuccessful telephone outreach was attempted today. Ms. Sitterly was referred to the case management team for assistance with care management and care coordination. Phone rang without an option to leave a voice message.   Follow Up Plan: The care team will attempt to contact Ms. Landron again within the next two weeks.   [facility] Care [facility] [phone]

## 2019-10-22 ENCOUNTER — Ambulatory Visit: Payer: Self-pay

## 2019-10-22 ENCOUNTER — Telehealth: Payer: Self-pay

## 2019-10-22 NOTE — Chronic Care Management (AMB) (Signed)
  Chronic Care Management   Outreach Note  10/22/2019 Name: Susan Bowen MRN: [id number] DOB: [birth year]  Primary Care Provider: Steele Sizer, MD Reason for referral : Chronic Care Management   An unsuccessful telephone outreach was attempted today. [name] was referred to the case management team for assistance with care management and care coordination.    Follow Up Plan: The care management team will reach out to [name] again within the next two to three weeks.   [facility] Care [facility] [phone]

## 2019-11-06 ENCOUNTER — Other Ambulatory Visit: Payer: Self-pay | Admitting: Family Medicine

## 2019-11-06 DIAGNOSIS — E785 Hyperlipidemia, unspecified: Secondary | ICD-10-CM

## 2019-11-06 DIAGNOSIS — F5101 Primary insomnia: Secondary | ICD-10-CM

## 2019-11-08 ENCOUNTER — Telehealth: Payer: Self-pay | Admitting: Cardiovascular Disease

## 2019-11-08 NOTE — Telephone Encounter (Signed)
   Primary Cardiologist: Kathlyn [place], MD  Chart reviewed as part of pre-operative protocol coverage. Simple dental extractions are considered low risk procedures per guidelines and generally do not require any specific cardiac clearance. It is also generally accepted that for simple extractions and dental cleanings, there is no need to interrupt blood thinner therapy.   SBE prophylaxis is/is not required for the patient.  I will route this recommendation to the requesting party via Epic fax function and remove from pre-op pool.  Please call with questions.  Jory Sims, NP 11/08/2019, 9:53 AM

## 2019-11-08 NOTE — Telephone Encounter (Signed)
   [facility] Pre-operative Risk Assessment    Request for surgical clearance:  1. What type of surgery is being performed? EXTRACTIONS  2. When is this surgery scheduled? PATIENT IS IN CHAIR NOW  3. What type of clearance is required (medical clearance vs. Pharmacy clearance to hold med vs. Both)? BOTH  4. Are there any medications that need to be held prior to surgery and how long? ELIQUIS  5. Practice name and name of physician performing surgery? DR Susan Bowen  6. What is your office phone number [phone]   7.   What is your office fax number [phone]  8.   Anesthesia type (None, local, MAC, general) ? Susan Bowen 11/08/2019, 9:34 AM  _________________________________________________________________   (provider comments below)

## 2019-11-10 ENCOUNTER — Telehealth: Payer: Self-pay

## 2019-11-10 ENCOUNTER — Other Ambulatory Visit: Payer: Self-pay | Admitting: Family Medicine

## 2019-11-10 ENCOUNTER — Ambulatory Visit: Payer: Self-pay

## 2019-11-10 DIAGNOSIS — E1121 Type 2 diabetes mellitus with diabetic nephropathy: Secondary | ICD-10-CM

## 2019-11-10 DIAGNOSIS — E1169 Type 2 diabetes mellitus with other specified complication: Secondary | ICD-10-CM

## 2019-11-10 NOTE — Telephone Encounter (Signed)
Requested medication (s) are due for refill today: yes  Requested medication (s) are on the active medication list: yes  Last refill:  05/10/2019  Future visit scheduled: yes  Notes to clinic:  diabetes - SGLT2 inhibitors failed  Requested Prescriptions  Pending Prescriptions Disp Refills   JARDIANCE 25 MG TABS tablet [Pharmacy Med Name: JARDIANCE 25 MG TABLET] 90 tablet 1    Sig: TAKE 1 TABLET BY MOUTH DAILY.      Endocrinology:  Diabetes - SGLT2 Inhibitors Failed - 11/10/2019  5:06 PM      Failed - Cr in normal range and within 360 days    Creat  Date Value Ref Range Status  12/28/2018 1.09 (H) 0.60 - 0.88 mg/dL Final    Comment:    For patients >91 years of age, the reference limit for Creatinine is approximately 13% higher for people identified as African-American. .    Creatinine, Ser  Date Value Ref Range Status  09/06/2019 1.16 (H) 0.44 - 1.00 mg/dL Final   Creatinine, Urine  Date Value Ref Range Status  12/28/2018 64 20 - 275 mg/dL Final          Failed - eGFR in normal range and within 360 days    GFR, Est African American  Date Value Ref Range Status  12/28/2018 54 (L) > OR = 60 mL/min/1.35m Final   GFR calc Af Amer  Date Value Ref Range Status  09/06/2019 50 (L) >60 mL/min Final   GFR, Est Non African American  Date Value Ref Range Status  12/28/2018 46 (L) > OR = 60 mL/min/1.756mFinal   GFR calc non Af Amer  Date Value Ref Range Status  09/06/2019 43 (L) >60 mL/min Final          Passed - LDL in normal range and within 360 days    LDL Cholesterol (Calc)  Date Value Ref Range Status  12/28/2018 69 mg/dL (calc) Final    Comment:    Reference range: <100 . Desirable range <100 mg/dL for primary prevention;   <70 mg/dL for patients with CHD or diabetic patients  with > or = 2 CHD risk factors. . Marland KitchenDL-C is now calculated using the Martin-Hopkins  calculation, which is a validated novel method providing  better accuracy than the Friedewald  equation in the  estimation of LDL-C.  MaCresenciano Genret al. JAAnnamaria [id number];[id number]  (http://education.[employer].com/faq/FAQ164)           Passed - HBA1C is between 0 and 7.9 and within 180 days    Hemoglobin A1C  Date Value Ref Range Status  09/10/2019 7.5 (A) 4.0 - 5.6 % Final   HbA1c, POC (controlled diabetic range)  Date Value Ref Range Status  06/26/2018 7.5 (A) 0.0 - 7.0 % Final   Hgb A1c MFr Bld  Date Value Ref Range Status  12/28/2018 8.5 (H) <5.7 % of total Hgb Final    Comment:    For someone without known diabetes, a hemoglobin A1c value of 6.5% or greater indicates that they may have  diabetes and this should be confirmed with a follow-up  test. . For someone with known diabetes, a value <7% indicates  that their diabetes is well controlled and a value  greater than or equal to 7% indicates suboptimal  control. A1c targets should be individualized based on  duration of diabetes, age, comorbid conditions, and  other considerations. . Currently, no consensus exists regarding use of hemoglobin A1c for diagnosis of diabetes for children. .Marland Kitchen  Passed - Valid encounter within last 6 months    Recent Outpatient Visits           2 months ago Diabetes mellitus with proteinuric diabetic nephropathy [facility])   [facility], Drue Stager, MD   6 months ago Diabetes mellitus with proteinuric diabetic nephropathy [facility])   [facility] Steele Sizer, MD   7 months ago Bloating   [facility] Steele Sizer, MD   7 months ago Paroxysmal A-fib [facility])   [facility] Steele Sizer, MD   10 months ago Dyslipidemia associated with type 2 diabetes mellitus [facility])   [facility] Steele Sizer, MD       Future Appointments             In 1 month Ancil Boozer, Drue Stager, MD [facility], [place]   In 2 months Fletcher Anon, Mertie Clause, MD [facility], [facility]

## 2019-11-10 NOTE — Chronic Care Management (AMB) (Signed)
  Chronic Care Management   Outreach Note  11/10/2019 Name: Susan Bowen MRN: [id number] DOB: 06/28/1932  Primary Care Provider: Steele Sizer, MD Reason for referral : Chronic Case Management    An unsuccessful telephone outreach was attempted today. Susan Bowen was referred to the case management team for assistance with care management and care coordination.     Follow Up Plan: The care management team will reach out to Susan Bowen again within the next two to three weeks.   [facility] Care Management [phone]

## 2019-12-01 ENCOUNTER — Telehealth: Payer: Self-pay

## 2019-12-03 DIAGNOSIS — Z23 Encounter for immunization: Secondary | ICD-10-CM | POA: Diagnosis not present

## 2019-12-06 ENCOUNTER — Telehealth: Payer: Self-pay

## 2019-12-06 ENCOUNTER — Ambulatory Visit: Payer: Self-pay

## 2019-12-13 ENCOUNTER — Other Ambulatory Visit: Payer: Self-pay | Admitting: Cardiovascular Disease

## 2019-12-24 DIAGNOSIS — Z23 Encounter for immunization: Secondary | ICD-10-CM | POA: Diagnosis not present

## 2020-01-04 ENCOUNTER — Other Ambulatory Visit: Payer: Self-pay | Admitting: Family Medicine

## 2020-01-04 DIAGNOSIS — E785 Hyperlipidemia, unspecified: Secondary | ICD-10-CM

## 2020-01-04 DIAGNOSIS — E1121 Type 2 diabetes mellitus with diabetic nephropathy: Secondary | ICD-10-CM

## 2020-01-04 DIAGNOSIS — E1169 Type 2 diabetes mellitus with other specified complication: Secondary | ICD-10-CM

## 2020-01-04 NOTE — Telephone Encounter (Signed)
Requested Prescriptions  Pending Prescriptions Disp Refills  . TRULICITY 1.5 0000000 SOPN [Pharmacy Med Name: TRULICITY 1.5 [id number] ML PEN]  1    Sig: INJECT 1.5 MG INTO THE SKIN EVERY THURSDAY.     Endocrinology:  Diabetes - GLP-1 Receptor Agonists Passed - 01/04/2020  7:48 AM      Passed - HBA1C is between 0 and 7.9 and within 180 days    Hemoglobin A1C  Date Value Ref Range Status  09/10/2019 7.5 (A) 4.0 - 5.6 % Final   HbA1c, POC (controlled diabetic range)  Date Value Ref Range Status  06/26/2018 7.5 (A) 0.0 - 7.0 % Final   Hgb A1c MFr Bld  Date Value Ref Range Status  12/28/2018 8.5 (H) <5.7 % of total Hgb Final    Comment:    For someone without known diabetes, a hemoglobin A1c value of 6.5% or greater indicates that they may have  diabetes and this should be confirmed with a follow-up  test. . For someone with known diabetes, a value <7% indicates  that their diabetes is well controlled and a value  greater than or equal to 7% indicates suboptimal  control. A1c targets should be individualized based on  duration of diabetes, age, comorbid conditions, and  other considerations. . Currently, no consensus exists regarding use of hemoglobin A1c for diagnosis of diabetes for children. Renella Cunas - Valid encounter within last 6 months    Recent Outpatient Visits          3 months ago Diabetes mellitus with proteinuric diabetic nephropathy [facility])   [facility] Steele Sizer, MD   7 months ago Diabetes mellitus with proteinuric diabetic nephropathy [facility])   [facility] Steele Sizer, MD   9 months ago Bloating   [facility] Steele Sizer, MD   9 months ago Paroxysmal A-fib [facility])   [facility] Steele Sizer, MD   1 year ago Dyslipidemia associated with type 2 diabetes mellitus [facility])   [facility] Steele Sizer, MD      Future Appointments            In  3 days Steele Sizer, MD [facility], [place]   In 2 weeks Wellington Hampshire, MD [facility], [place]

## 2020-01-07 ENCOUNTER — Other Ambulatory Visit: Payer: Self-pay

## 2020-01-07 ENCOUNTER — Encounter: Payer: Self-pay | Admitting: Family Medicine

## 2020-01-07 ENCOUNTER — Ambulatory Visit (INDEPENDENT_AMBULATORY_CARE_PROVIDER_SITE_OTHER): Payer: Medicare Other | Admitting: Family Medicine

## 2020-01-07 VITALS — BP 128/72 | HR 91 | Temp 96.9°F | Resp 16 | Ht 64.0 in | Wt 210.7 lb

## 2020-01-07 DIAGNOSIS — E1169 Type 2 diabetes mellitus with other specified complication: Secondary | ICD-10-CM | POA: Diagnosis not present

## 2020-01-07 DIAGNOSIS — Z8739 Personal history of other diseases of the musculoskeletal system and connective tissue: Secondary | ICD-10-CM

## 2020-01-07 DIAGNOSIS — I10 Essential (primary) hypertension: Secondary | ICD-10-CM

## 2020-01-07 DIAGNOSIS — M545 Low back pain, unspecified: Secondary | ICD-10-CM

## 2020-01-07 DIAGNOSIS — N1831 Chronic kidney disease, stage 3a: Secondary | ICD-10-CM | POA: Diagnosis not present

## 2020-01-07 DIAGNOSIS — I700 Atherosclerosis of aorta: Secondary | ICD-10-CM | POA: Diagnosis not present

## 2020-01-07 DIAGNOSIS — E890 Postprocedural hypothyroidism: Secondary | ICD-10-CM | POA: Diagnosis not present

## 2020-01-07 DIAGNOSIS — E1121 Type 2 diabetes mellitus with diabetic nephropathy: Secondary | ICD-10-CM | POA: Diagnosis not present

## 2020-01-07 DIAGNOSIS — M898X6 Other specified disorders of bone, lower leg: Secondary | ICD-10-CM

## 2020-01-07 DIAGNOSIS — E785 Hyperlipidemia, unspecified: Secondary | ICD-10-CM

## 2020-01-07 DIAGNOSIS — R202 Paresthesia of skin: Secondary | ICD-10-CM | POA: Diagnosis not present

## 2020-01-07 DIAGNOSIS — F5101 Primary insomnia: Secondary | ICD-10-CM | POA: Diagnosis not present

## 2020-01-07 DIAGNOSIS — I480 Paroxysmal atrial fibrillation: Secondary | ICD-10-CM

## 2020-01-07 DIAGNOSIS — E559 Vitamin D deficiency, unspecified: Secondary | ICD-10-CM

## 2020-01-07 LAB — POCT GLYCOSYLATED HEMOGLOBIN (HGB A1C): Hemoglobin A1C: 7.8 % — AB (ref 4.0–5.6)

## 2020-01-07 MED ORDER — TRAZODONE HCL 50 MG PO TABS: 25.0000 mg | ORAL_TABLET | Freq: Every evening | ORAL | 0 refills | Status: DC | PRN

## 2020-01-07 MED ORDER — JARDIANCE 25 MG PO TABS: 25.0000 mg | ORAL_TABLET | Freq: Every day | ORAL | 1 refills | Status: DC

## 2020-01-07 MED ORDER — VITAMIN D (ERGOCALCIFEROL) 1.25 MG (50000 UNIT) PO CAPS: ORAL_CAPSULE | ORAL | 1 refills | Status: DC

## 2020-01-07 MED ORDER — ACETAMINOPHEN-CODEINE 300-30 MG PO TABS: 1.0000 | ORAL_TABLET | Freq: Four times a day (QID) | ORAL | 0 refills | Status: DC | PRN

## 2020-01-07 MED ORDER — METFORMIN HCL 850 MG PO TABS: 850.0000 mg | ORAL_TABLET | Freq: Two times a day (BID) | ORAL | 1 refills | Status: DC

## 2020-01-07 NOTE — Progress Notes (Addendum)
Name: Susan Bowen   MRN: [id number]    DOB: [birth year]   Date:01/07/2020       Progress Note  Subjective  Chief Complaint  Chief Complaint  Patient presents with  . Hypertension  . Diabetes  . Hypothyroidism    She no longer sees Dr. Gabriel Carina. Needs thyroid evaluation.  . Dyslipidemia  . Pain    States that tramadol does nothing for her pain.    HPI  DMII with microalbuminuria; she is taking medications,ARB and statin therapy. FSBS at home fasting has been 134-160's She has occasional polyphagia, polydipsia but nopolyuria. Her hgbA1C was 10.5%, dropped to 8.5% 7.8%, 7.5% and today Is up to 7.8%    - that is good control for her based on her ageShe is still following a diabetic diet most of the time.She has been taking Metformin twice daily, Trulicity, Jardiance and is doing well - GFR has been stable and we will recheck labs . Eye exam is up to date, she is on statin and ARB  HTN: she has been taking medication. No chest pain, but occasionally has heart flutter.  She denies dizziness.BP is at goal. She is also on Benicar HCTZ and explained HCTZ can cause gout to flare, but no episodes in a long time, she even stopped taking Allopurinol and we will recheck level   Afib : under the care of Dr. Fletcher Anon, rate controlled, she has sob with activity but very seldom has a heart fluttering sensation .  On Eliquis, denies any bleeding   Hyperlipidemia: taking Pravastatin and denies side effects of medications, no myalgia.Recheck labs today   Pulmonary nodule: seen by Dr. Raul Del, lung nodule stableand finished CT in 2017 . She hasyearly follow ups but because of transportation ( needs to rely on her children) , she is now seeing Dr. Janese Banks and had a CT chest done 08/2019. CT showed possible pulmonary hypertension. Pulmonary nodules unchanged since 2017.   Aorta atherosclerosis on CT chest: on statin and Eliquis  Carcinoid tumor with metastasis: incidental finding onCT abdomen  done for evaluation of renal stone back in 03/26/2019, it showed retroperitoneal lymphadenopathy, she has since seen Dr. Janese Banks and has been diagnosed with carcinoid tumor with metastasis and going to have watchful waiting. since she has been asymptomatic. Last chromogranin A was high at 23, she has mild anemia and stable . Next follow up is in May   Goiter: s/p thyroid ablation, Summer 2016. Seeing Dr. Gabriel Carina, she is on Levothyroxine 50  mcg daily, last TSH was at goal 07/13/2019   Insomnia: she is taking Trazodone   Morbid obesity: BMI above 35 with co-morbidities ( DM, HTN, gout) she is also 87 and has co-morbidities, because of her again and history of carcinoid tumor try not to lose weight, but to eat healthy  Contusion of right lower tibia: a hammer fell on right tibia about 8  months ago developed a hematoma, still has soreness but states improving, she uses voltaren gel and improves symtpoms   CKI stage III: stable, no pruritis. Good urine output. We will recheck labs today   Intermittent right lower back pain: Tramadol does not help but Tylenol # 3 given by dentist seems to control her pain better.   Patient Active Problem List   Diagnosis Date Noted  . Carcinoid tumor, malignant ([place]) 09/08/2019  . Pain in joint of left shoulder 02/25/2019  . Pulmonary hypertension, unspecified ([place]) 12/28/2018  . Paroxysmal A-fib ([place]) 12/28/2018  . Chronic kidney disease,  stage III (moderate) 04/01/2018  . Elevated parathyroid hormone 01/01/2018  . Elevated uric acid in blood 04/29/2017  . Plantar fasciitis of left foot 03/12/2016  . Postprocedural hypothyroidism 01/23/2016  . Aortic stenosis 09/26/2015  . Allergic rhinitis, seasonal 08/08/2015  . Benign hypertension 08/08/2015  . History of pneumonia 08/08/2015  . Carpal tunnel syndrome 08/08/2015  . Cataract 08/08/2015  . Insomnia, persistent 08/08/2015  . Dyslipidemia 08/08/2015  . History of depression 08/08/2015  . Glaucoma  08/08/2015  . Controlled gout 08/08/2015  . Fever blister 08/08/2015  . Bilateral hearing loss 08/08/2015  . Hemorrhoid 08/08/2015  . H/O iron deficiency anemia 08/08/2015  . Benign neoplasm of colon 08/08/2015  . Impingement syndrome of shoulder 08/08/2015  . Osteoporosis, post-menopausal 08/08/2015  . Morbid obesity ([place]) 08/08/2015  . Generalized OA 08/08/2015  . Multinodular goiter 08/08/2015  . Asymptomatic varicose veins 08/08/2015  . Polyp of vocal cord 08/08/2015  . History of vertebral compression fracture 08/08/2015  . Diabetes mellitus with proteinuric diabetic nephropathy ([place]) 08/08/2015  . LVH (left ventricular hypertrophy) 08/08/2015  . Moderate tricuspid regurgitation 08/08/2015  . History of radioactive iodine thyroid ablation 08/08/2015  . Lung nodule, solitary 05/12/2014  . Chronic cough 05/11/2014  . Vitamin D deficiency 12/20/2009  . Plantar fascial fibromatosis 12/20/2009  . Personal history of fall 07/20/2008  . Cervical radiculitis 05/11/2008    Past Surgical History:  Procedure Laterality Date  . ABDOMINAL HYSTERECTOMY  1986  . APPENDECTOMY    . BREAST SURGERY Bilateral 1983   biopsy of each side, negative  . CARPAL TUNNEL RELEASE    . CATARACT EXTRACTION W/PHACO Left 09/03/2017   Procedure: CATARACT EXTRACTION PHACO AND INTRAOCULAR LENS PLACEMENT (IOC)-LEFT DIABETIC;  Surgeon: Birder Robson, MD;  Location: [facility] ORS;  Service: Ophthalmology;  Laterality: Left;  [place] 01:10.2 AP% 16.7 CDE 11.71 Fluid Pack Lot # [id number]  . CATARACT EXTRACTION W/PHACO Right 09/30/2017   Procedure: CATARACT EXTRACTION PHACO AND INTRAOCULAR LENS PLACEMENT (IOC);  Surgeon: Birder Robson, MD;  Location: [facility] ORS;  Service: Ophthalmology;  Laterality: Right;  [place] 01:35.1 AP% 13.1 CDE 12.44 Fluid pack Lot # [id number]  . COLON SURGERY    . EYE SURGERY Left 09/03/2017   Cataract extraction  . FEMUR FRACTURE SURGERY Left   . FRACTURE SURGERY  2001   left femur  .  HEMORROIDECTOMY    . polyp removed from vocal cord      Family History  Problem Relation Age of Onset  . Diabetes Mother   . Hypertension Mother   . Dementia Mother   . Hypertension Son     Social History   Tobacco Use  . Smoking status: Never Smoker  . Smokeless tobacco: Never Used  Substance Use Topics  . Alcohol use: No    Alcohol/week: 0.0 standard drinks     Current Outpatient Medications:  .  acetaminophen (TYLENOL) 500 MG tablet, Take 500 mg by mouth every 6 (six) hours as needed., Disp: , Rfl:  .  allopurinol (ZYLOPRIM) 100 MG tablet, TAKE 1 TABLET (100 MG TOTAL) BY MOUTH 2 (TWO) TIMES DAILY. TO PREVENT GOUT, Disp: 180 tablet, Rfl: 0 .  Blood Glucose Monitoring Suppl (ACCU-CHEK AVIVA PLUS) w/Device KIT, 1 kit by Does not apply route 3 (three) times daily., Disp: 1 kit, Rfl: 0 .  carvedilol (COREG) 25 MG tablet, Take 1 tablet (25 mg total) by mouth 2 (two) times daily. New dose, Disp: 180 tablet, Rfl: 3 .  COLCRYS 0.6 MG tablet, TAKE 1-2 TABLETS (  0.6-1.2 MG TOTAL) BY MOUTH DAILY AS NEEDED. FOR GOUT, NOT DAILY, Disp: 30 tablet, Rfl: 0 .  COMBIGAN 0.2-0.5 % ophthalmic solution, Place 1 drop into both eyes 2 (two) times daily., Disp: , Rfl:  .  Cyanocobalamin (B-12) 1000 MCG TABS, Take 1,000 mcg daily by mouth., Disp: , Rfl:  .  ELIQUIS 5 MG TABS tablet, TAKE 1 TABLET BY MOUTH TWICE A DAY, Disp: 180 tablet, Rfl: 1 .  glucose blood (ACCU-CHEK AVIVA PLUS) test strip, Check fasting glucose once each morning, and check glucose as needed., Disp: 100 each, Rfl: 12 .  JARDIANCE 25 MG TABS tablet, TAKE 1 TABLET BY MOUTH DAILY., Disp: 90 tablet, Rfl: 0 .  Lancets (ACCU-CHEK MULTICLIX) lancets, Use as instructed, Disp: 204 each, Rfl: 12 .  levothyroxine (SYNTHROID) 50 MCG tablet, Take 1 tablet (50 mcg total) by mouth daily before breakfast., Disp: 90 tablet, Rfl: 1 .  loratadine (CLARITIN) 10 MG tablet, TAKE 1 TABLET BY MOUTH EVERY DAY, Disp: 90 tablet, Rfl: 2 .  metFORMIN (GLUCOPHAGE)  850 MG tablet, Take 1 tablet (850 mg total) by mouth 2 (two) times daily., Disp: 180 tablet, Rfl: 1 .  olmesartan-hydrochlorothiazide (BENICAR HCT) 40-25 MG tablet, TAKE 1 TABLET BY MOUTH EVERY DAY, Disp: 90 tablet, Rfl: 0 .  pantoprazole (PROTONIX) 40 MG tablet, Take 40 mg by mouth daily., Disp: , Rfl:  .  pravastatin (PRAVACHOL) 40 MG tablet, TAKE 1 TABLET BY MOUTH EVERY DAY, Disp: 90 tablet, Rfl: 1 .  Travoprost, BAK Free, (TRAVATAN) 0.004 % SOLN ophthalmic solution, Place 1 drop into both eyes at bedtime., Disp: , Rfl:  .  traZODone (DESYREL) 50 MG tablet, TAKE 0.5-1 TABLETS (25-50 MG TOTAL) BY MOUTH AT BEDTIME AS NEEDED FOR SLEEP., Disp: 90 tablet, Rfl: 0 .  TRULICITY 1.5 KT/6.2BW SOPN, INJECT 1.5 MG INTO THE SKIN EVERY THURSDAY., Disp: 6 pen, Rfl: 1 .  Vitamin D, Ergocalciferol, (DRISDOL) 1.25 MG (50000 UT) CAPS capsule, TAKE WEEKLY, Disp: 12 capsule, Rfl: 1 .  diclofenac Sodium (VOLTAREN) 1 % GEL, Apply topically., Disp: , Rfl:   Allergies  Allergen Reactions  . Augmentin [Amoxicillin-Pot Clavulanate] Itching    Has patient had a PCN reaction causing immediate rash, facial/tongue/throat swelling, SOB or lightheadedness with hypotension: No Has patient had a PCN reaction causing severe rash involving mucus membranes or skin necrosis: No Has patient had a PCN reaction that required hospitalization: No Has patient had a PCN reaction occurring within the last 10 years: No If all of the above answers are "NO", then may proceed with Cephalosporin use.     I personally reviewed active problem list, medication list, allergies, family history, social history, health maintenance with the patient/caregiver today.   ROS  Constitutional: Negative for fever or weight change.  Respiratory: Negative for cough and shortness of breath.   Cardiovascular: Negative for chest pain or palpitations.  Gastrointestinal: Negative for abdominal pain, no bowel changes.  Musculoskeletal: Negative for gait  problem or joint swelling.  Skin: Negative for rash.  Neurological: Negative for dizziness or headache.  No other specific complaints in a complete review of systems (except as listed in HPI above).  Objective  Vitals:   01/07/20 1104 01/07/20 1110  BP: 140/80 128/72  Pulse: 91   Resp: 16   Temp: (!) 96.9 F (36.1 C)   TempSrc: Temporal   SpO2: 94%   Weight: 210 lb 11.2 oz (95.6 kg)   Height: '5\' 4"'$  (1.626 m)     Body mass index  is 36.17 kg/m.  Physical Exam  Constitutional: Patient appears well-developed and well-nourished. Obese No distress.  HEENT: head atraumatic, normocephalic, pupils reactive to light, Cardiovascular: Normal rate, regular rhythm and normal heart sounds.  No murmur heard. No BLE edema. Pulmonary/Chest: Effort normal and breath sounds normal. No respiratory distress. Abdominal: Soft.  There is no tenderness. Psychiatric: Patient has a normal mood and affect. behavior is normal. Judgment and thought content normal.  Recent Results (from the past 2160 hour(s))  POCT HgB A1C     Status: Abnormal   Collection Time: 01/07/20 11:12 AM  Result Value Ref Range   Hemoglobin A1C 7.8 (A) 4.0 - 5.6 %   HbA1c POC (<> result, manual entry)     HbA1c, POC (prediabetic range)     HbA1c, POC (controlled diabetic range)       PHQ2/9: Depression screen [facility] 2/9 01/07/2020 09/10/2019 05/10/2019 03/25/2019 12/28/2018  Decreased Interest 0 0 0 0 0  Down, Depressed, Hopeless 0 0 0 0 0  PHQ - 2 Score 0 0 0 0 0  Altered sleeping 0 0 - 0 0  Tired, decreased energy 0 0 - 0 0  Change in appetite 0 0 - 0 0  Feeling bad or failure about yourself  0 0 - 0 0  Trouble concentrating 0 0 - 0 0  Moving slowly or fidgety/restless 0 0 - 0 0  Suicidal thoughts 0 0 - 0 0  PHQ-9 Score 0 0 - 0 0  Difficult doing work/chores - - - Not difficult at all Not difficult at all    phq 9 is negative   Fall Risk: Fall Risk  01/07/2020 09/10/2019 05/10/2019 03/25/2019 12/28/2018  Falls in the past  year? 0 0 0 0 0  Number falls in past yr: 0 0 0 0 0  Injury with Fall? 0 0 0 0 0  Follow up - - - - Falls evaluation completed    Functional Status Survey: Is the patient deaf or have difficulty hearing?: Yes Does the patient have difficulty seeing, even when wearing glasses/contacts?: Yes Does the patient have difficulty concentrating, remembering, or making decisions?: No Does the patient have difficulty walking or climbing stairs?: No Does the patient have difficulty dressing or bathing?: No Does the patient have difficulty doing errands alone such as visiting a doctor's office or shopping?: No   Assessment & Plan  1. Diabetes mellitus with proteinuric diabetic nephropathy (HCC)  - POCT HgB A1C - Lipid panel - Microalbumin / creatinine urine ratio - COMPLETE METABOLIC PANEL WITH GFR - metFORMIN (GLUCOPHAGE) 850 MG tablet; Take 1 tablet (850 mg total) by mouth 2 (two) times daily.  Dispense: 180 tablet; Refill: 1 - empagliflozin (JARDIANCE) 25 MG TABS tablet; Take 25 mg by mouth daily.  Dispense: 90 tablet; Refill: 1  2. Hypothyroidism, postablative  - TSH  3. Atherosclerosis of aorta (HCC)  - Lipid panel  4. Paroxysmal A-fib (HCC)  Rate controlled  5. Stage 3a chronic kidney disease  Recheck labs   6. Morbid obesity ([place])  Eat healthy   7. Benign hypertension  - COMPLETE METABOLIC PANEL WITH GFR  8. Vitamin D deficiency * - Vitamin D, Ergocalciferol, (DRISDOL) 1.25 MG (50000 UNIT) CAPS capsule; Weekly  Dispense: 12 capsule; Refill: 1  9. Paresthesia of left foot  Only in am's intermittent, reassurance given   10. Primary insomnia  - traZODone (DESYREL) 50 MG tablet; Take 0.5-1 tablets (25-50 mg total) by mouth at bedtime as needed for sleep.  Dispense: 90 tablet; Refill: 0  11. Dyslipidemia associated with type 2 diabetes mellitus (HCC)  - metFORMIN (GLUCOPHAGE) 850 MG tablet; Take 1 tablet (850 mg total) by mouth 2 (two) times daily.  Dispense: 180  tablet; Refill: 1 - empagliflozin (JARDIANCE) 25 MG TABS tablet; Take 25 mg by mouth daily.  Dispense: 90 tablet; Refill: 1  12. History of gout  - Uric acid  13. Pain of right tibia  - DG Tibia/Fibula Left; Future   14. Intermittent low back pain  - Acetaminophen-Codeine 300-30 MG tablet; Take 1 tablet by mouth every 6 (six) hours as needed for pain.  Dispense: 30 tablet; Refill: 0

## 2020-01-07 NOTE — Addendum Note (Signed)
Addended by: Steele Sizer F on: 01/07/2020 11:43 AM   Modules accepted: Orders

## 2020-01-08 LAB — COMPLETE METABOLIC PANEL WITH GFR
AG Ratio: 1.4 (calc) (ref 1.0–2.5)
ALT: 8 U/L (ref 6–29)
AST: 12 U/L (ref 10–35)
Albumin: 3.8 g/dL (ref 3.6–5.1)
Alkaline phosphatase (APISO): 56 U/L (ref 37–153)
BUN/Creatinine Ratio: 19 (calc) (ref 6–22)
BUN: 25 mg/dL (ref 7–25)
CO2: 24 mmol/L (ref 20–32)
Calcium: 10.3 mg/dL (ref 8.6–10.4)
Chloride: 105 mmol/L (ref 98–110)
Creat: 1.3 mg/dL — ABNORMAL HIGH (ref 0.60–0.88)
GFR, Est African American: 43 mL/min/{1.73_m2} — ABNORMAL LOW (ref >=60)
GFR, Est Non African American: 37 mL/min/{1.73_m2} — ABNORMAL LOW (ref >=60)
Globulin: 2.8 g/dL (calc) (ref 1.9–3.7)
Glucose, Bld: 130 mg/dL — ABNORMAL HIGH (ref 65–99)
Potassium: 4.1 mmol/L (ref 3.5–5.3)
Sodium: 140 mmol/L (ref 135–146)
Total Bilirubin: 0.3 mg/dL (ref 0.2–1.2)
Total Protein: 6.6 g/dL (ref 6.1–8.1)

## 2020-01-08 LAB — LIPID PANEL
Cholesterol: 144 mg/dL (ref <200)
HDL: 49 mg/dL — ABNORMAL LOW (ref >=50)
LDL Cholesterol (Calc): 73 mg/dL (calc)
Non-HDL Cholesterol (Calc): 95 mg/dL (calc) (ref <130)
Total CHOL/HDL Ratio: 2.9 (calc) (ref <5.0)
Triglycerides: 141 mg/dL (ref <150)

## 2020-01-08 LAB — MICROALBUMIN / CREATININE URINE RATIO
Creatinine, Urine: 100 mg/dL (ref 20–275)
Microalb Creat Ratio: 2 mcg/mg creat (ref <30)
Microalb, Ur: 0.2 mg/dL

## 2020-01-08 LAB — URIC ACID: Uric Acid, Serum: 8 mg/dL — ABNORMAL HIGH (ref 2.5–7.0)

## 2020-01-08 LAB — TSH: TSH: 1.72 mIU/L (ref 0.40–4.50)

## 2020-01-14 DIAGNOSIS — H26491 Other secondary cataract, right eye: Secondary | ICD-10-CM | POA: Diagnosis not present

## 2020-01-19 ENCOUNTER — Telehealth: Payer: Self-pay

## 2020-01-19 ENCOUNTER — Ambulatory Visit: Payer: Self-pay

## 2020-01-19 NOTE — Chronic Care Management (AMB) (Signed)
  Chronic Care Management   Outreach Note  Name: Susan Bowen MRN: [id number] DOB: 1932/08/05   Referred by: Steele Sizer, MD Reason for referral : Chronic Care Management   An unsuccessful telephone outreach was attempted today. [name] was referred to the case management team for assistance with care management and care coordination. During a previous discussion, she discussed interest in the CCM program.  Unable to leave a  message today. Phone rang multiple times without option to leave a voice message.    Follow Up Plan:  The care management team will reach out to [name] again next month. Will plan to engage if she is still interested in the program.  [facility] Care Management [phone]

## 2020-01-19 NOTE — Chronic Care Management (AMB) (Signed)
  Chronic Care Management   Outreach Note  01/19/2020 Name: Susan Bowen MRN: [id number] DOB: 06/20/1932   Primary Care Provider: Steele Sizer, MD Reason for referral : Chronic Care Management    An unsuccessful telephone outreach was attempted today. [name] was previously referred to the case management team for assistance with care management and care coordination.   Phone rang multiple times today without an option to leave a voice message.   Follow Up Plan: The care management team will make a final attempt to reach [name] next week.    [facility] Care Management [phone]

## 2020-01-20 ENCOUNTER — Ambulatory Visit (INDEPENDENT_AMBULATORY_CARE_PROVIDER_SITE_OTHER): Payer: Medicare Other | Admitting: Cardiovascular Disease

## 2020-01-20 ENCOUNTER — Encounter: Payer: Self-pay | Admitting: Cardiovascular Disease

## 2020-01-20 ENCOUNTER — Other Ambulatory Visit: Payer: Self-pay

## 2020-01-20 VITALS — BP 130/68 | HR 81 | Ht 64.0 in | Wt 215.0 lb

## 2020-01-20 DIAGNOSIS — I480 Paroxysmal atrial fibrillation: Secondary | ICD-10-CM | POA: Diagnosis not present

## 2020-01-20 DIAGNOSIS — I10 Essential (primary) hypertension: Secondary | ICD-10-CM | POA: Diagnosis not present

## 2020-01-20 DIAGNOSIS — E785 Hyperlipidemia, unspecified: Secondary | ICD-10-CM | POA: Diagnosis not present

## 2020-01-20 DIAGNOSIS — H26491 Other secondary cataract, right eye: Secondary | ICD-10-CM | POA: Diagnosis not present

## 2020-01-20 NOTE — Progress Notes (Signed)
  Cardiology Office Note   Date:  01/20/2020   ID:  [name], DOB 03/13/1932, MRN [id number]  PCP:  Sowles, Krichna, MD  Cardiologist:  Muhammad Arida, MD   Chief Complaint  Patient presents with  . other    6 month follow up. Meds reviewed by the pt. verbally. Pt. c/o shortness of breath and fluttering in chest.       History of Present Illness: Susan Bowen is a 86 y.o. female who presents for a follow-up visit regarding paroxysmal atrial fibrillation and mild aortic stenosis. She has multiple chronic medical conditions that include hypertension, diabetes and hyperlipidemia.   She is not a smoker and has no family history of premature coronary artery disease. She was hospitalized in May, 2019 with chest pain in the setting of A. fib with RVR.  The dose of carvedilol was increased to 12.5 mg twice daily and she was started on Eliquis for anticoagulation.  Echocardiogram showed normal LV systolic function with grade 1 diastolic dysfunction. She ultimately underwent outpatient Lexiscan Myoview which showed no evidence of ischemia with normal ejection fraction. In 2020, she was diagnosed with carcinoid tumor with metastasis.  This was an incidental finding on CT of the abdomen which was done for renal stone back in May.  She has been seen by oncology and going to have watchful waiting for now.  She has been doing well from a cardiac standpoint with no chest pain or worsening dyspnea.  No palpitations.  No side effects with anticoagulation.  She had CT scan of the chest done which showed aortic atherosclerosis and also dilated pulmonary artery.  This is expected given her known history of mild to moderate pulmonary hypertension.  Past Medical History:  Diagnosis Date  . Allergy   . Back spasm   . Bunion   . Carpal tunnel syndrome   . Cataracta   . Diabetes mellitus without complication (HCC)   . Generalized osteoarthritis   . Gout   . Hemorrhoids without complication   .  Hyperlipidemia   . Hypertension   . Hypothyroidism   . Impingement syndrome of right shoulder   . Insomnia   . Lipoma   . Lung nodule   . Metastatic carcinoid tumor (HCC) 03/2016  . Neuritis or radiculitis due to rupture of lumbar intervertebral disc   . Obesity   . Osteoporosis   . PAF (paroxysmal atrial fibrillation) (HCC)    a. 03/2018 AF w/ RVR-->converted spont.  CHA2DS2VASc = 7-->Eliquis.  . Proteinuria   . Rotator cuff tear   . Systolic murmur    a. 03/2018 Echo: EF 60-65%, no rwma, Gr1 DD, mild MR, mildly dil LA. Nl RV fxn. PASP 47mmHg.  . TIA (transient ischemic attack)   . TIA (transient ischemic attack) 1998   small TIA.  no residual effects  . Tinnitus of both ears   . Unspecified glaucoma(365.9)   . Vitamin D deficiency   . Wedge compression fracture of t11-T12 vertebra, sequela     Past Surgical History:  Procedure Laterality Date  . ABDOMINAL HYSTERECTOMY  1986  . APPENDECTOMY    . BREAST SURGERY Bilateral 1983   biopsy of each side, negative  . CARPAL TUNNEL RELEASE    . CATARACT EXTRACTION W/PHACO Left 09/03/2017   Procedure: CATARACT EXTRACTION PHACO AND INTRAOCULAR LENS PLACEMENT (IOC)-LEFT DIABETIC;  Surgeon: Porfilio, William, MD;  Location: [facility] ORS;  Service: Ophthalmology;  Laterality: Left;  US 01:10.2 AP% 16.7 CDE 11.71 Fluid Pack   Lot # [id number]  . CATARACT EXTRACTION W/PHACO Right 09/30/2017   Procedure: CATARACT EXTRACTION PHACO AND INTRAOCULAR LENS PLACEMENT (IOC);  Surgeon: Porfilio, William, MD;  Location: [facility] ORS;  Service: Ophthalmology;  Laterality: Right;  US 01:35.1 AP% 13.1 CDE 12.44 Fluid pack Lot # [id number]  . COLON SURGERY    . EYE SURGERY Left 09/03/2017   Cataract extraction  . FEMUR FRACTURE SURGERY Left   . FRACTURE SURGERY  2001   left femur  . HEMORROIDECTOMY    . polyp removed from vocal cord       Current Outpatient Medications  Medication Sig Dispense Refill  . acetaminophen (TYLENOL) 500 MG tablet Take 500 mg by  mouth every 6 (six) hours as needed.    . Acetaminophen-Codeine 300-30 MG tablet Take 1 tablet by mouth every 6 (six) hours as needed for pain. 30 tablet 0  . Blood Glucose Monitoring Suppl (ACCU-CHEK AVIVA PLUS) w/Device KIT 1 kit by Does not apply route 3 (three) times daily. 1 kit 0  . carvedilol (COREG) 25 MG tablet Take 1 tablet (25 mg total) by mouth 2 (two) times daily. New dose 180 tablet 3  . COLCRYS 0.6 MG tablet TAKE 1-2 TABLETS (0.6-1.2 MG TOTAL) BY MOUTH DAILY AS NEEDED. FOR GOUT, NOT DAILY 30 tablet 0  . COMBIGAN 0.2-0.5 % ophthalmic solution Place 1 drop into both eyes 2 (two) times daily.    . Cyanocobalamin (B-12) 1000 MCG TABS Take 1,000 mcg daily by mouth.    . diclofenac Sodium (VOLTAREN) 1 % GEL Apply topically.    . ELIQUIS 5 MG TABS tablet TAKE 1 TABLET BY MOUTH TWICE A DAY 180 tablet 1  . empagliflozin (JARDIANCE) 25 MG TABS tablet Take 25 mg by mouth daily. 90 tablet 1  . glucose blood (ACCU-CHEK AVIVA PLUS) test strip Check fasting glucose once each morning, and check glucose as needed. 100 each 12  . Lancets (ACCU-CHEK MULTICLIX) lancets Use as instructed 204 each 12  . levothyroxine (SYNTHROID) 50 MCG tablet Take 1 tablet (50 mcg total) by mouth daily before breakfast. 90 tablet 1  . loratadine (CLARITIN) 10 MG tablet TAKE 1 TABLET BY MOUTH EVERY DAY 90 tablet 2  . metFORMIN (GLUCOPHAGE) 850 MG tablet Take 1 tablet (850 mg total) by mouth 2 (two) times daily. 180 tablet 1  . olmesartan-hydrochlorothiazide (BENICAR HCT) 40-25 MG tablet TAKE 1 TABLET BY MOUTH EVERY DAY 90 tablet 0  . pantoprazole (PROTONIX) 40 MG tablet Take 40 mg by mouth daily.    . pravastatin (PRAVACHOL) 40 MG tablet TAKE 1 TABLET BY MOUTH EVERY DAY 90 tablet 1  . Travoprost, BAK Free, (TRAVATAN) 0.004 % SOLN ophthalmic solution Place 1 drop into both eyes at bedtime.    . traZODone (DESYREL) 50 MG tablet Take 0.5-1 tablets (25-50 mg total) by mouth at bedtime as needed for sleep. 90 tablet 0  .  TRULICITY 1.5 MG/0.5ML SOPN INJECT 1.5 MG INTO THE SKIN EVERY THURSDAY. 6 pen 1  . Vitamin D, Ergocalciferol, (DRISDOL) 1.25 MG (50000 UNIT) CAPS capsule Weekly 12 capsule 1   No current facility-administered medications for this visit.    Allergies:   Augmentin [amoxicillin-pot clavulanate]    Social History:  The patient  reports that she has never smoked. She has never used smokeless tobacco. She reports that she does not drink alcohol or use drugs.   Family History:  The patient's family history includes Dementia in her mother; Diabetes in her mother; Hypertension in her mother   and son.    ROS:  Please see the history of present illness.   Otherwise, review of systems are positive for none.   All other systems are reviewed and negative.    PHYSICAL EXAM: VS:  BP 130/68 (BP Location: Left Arm, Patient Position: Sitting, Cuff Size: Normal)   Pulse 81   Ht 5' 4" (1.626 m)   Wt 215 lb (97.5 kg)   LMP  (LMP Unknown)   SpO2 96%   BMI 36.90 kg/m  , BMI Body mass index is 36.9 kg/m. GEN: Well nourished, well developed, in no acute distress  HEENT: normal  Neck: no JVD, carotid bruits, or masses Cardiac: RRR; no  rubs, or gallops. Trace bilateral leg edema. 1 / 6 systolic murmur in the aortic area Respiratory:  clear to auscultation bilaterally, normal work of breathing GI: soft, nontender, nondistended, + BS MS: no deformity or atrophy  Skin: warm and dry, no rash Neuro:  Strength and sensation are intact Psych: euthymic mood, full affect   EKG:  EKG is ordered today. The ekg ordered today demonstrates normal sinus rhythm with nonspecific T wave changes  Recent Labs: 09/06/2019: Hemoglobin 11.6; Platelets 323 01/07/2020: ALT 8; BUN 25; Creat 1.30; Potassium 4.1; Sodium 140; TSH 1.72    Lipid Panel    Component Value Date/Time   CHOL 144 01/07/2020 1148   CHOL 146 12/19/2016 0951   TRIG 141 01/07/2020 1148   HDL 49 (L) 01/07/2020 1148   HDL 50 12/19/2016 0951   CHOLHDL  2.9 01/07/2020 1148   LDLCALC 73 01/07/2020 1148      Wt Readings from Last 3 Encounters:  01/20/20 215 lb (97.5 kg)  01/07/20 210 lb 11.2 oz (95.6 kg)  09/10/19 211 lb 8 oz (95.9 kg)      No flowsheet data found.    ASSESSMENT AND PLAN:  1.  Paroxysmal atrial fibrillation: She continues to be in sinus rhythm and seems to be stable from a cardiac standpoint.  Continue carvedilol and anticoagulation with Eliquis.  I reviewed recent labs which showed a recent creatinine of 1.3.  If creatinine goes above 1.5, recommend decreasing the dose of Eliquis to 2.5 mg twice daily.    2. Essential hypertension: Blood pressure is well controlled on current medications.    3. Hyperlipidemia: Currently on pravastatin with most recent LDL of 61.   Disposition:   FU with me in 6 months   Signed, Muhammad Arida, MD 01/20/20 

## 2020-01-20 NOTE — Patient Instructions (Signed)

## 2020-01-22 ENCOUNTER — Other Ambulatory Visit: Payer: Self-pay | Admitting: Cardiovascular Disease

## 2020-01-24 NOTE — Telephone Encounter (Signed)
Refill request

## 2020-01-24 NOTE — Telephone Encounter (Signed)
Pt's age 86, wt 97.5 kg, SCr 1.3, CrCl 47.81, last ov w/ MA 01/20/20.

## 2020-01-26 ENCOUNTER — Telehealth: Payer: Self-pay

## 2020-01-26 ENCOUNTER — Ambulatory Visit: Payer: Self-pay

## 2020-01-26 NOTE — Chronic Care Management (AMB) (Addendum)
  Chronic Care Management   Outreach Note  01/26/2020 Name: Susan Bowen MRN: [id number] DOB: 1932/10/13  Primary Care Provider: Steele Sizer, MD Reason for referral : Chronic Care Management    Susan Bowen was referred to the care management team for assistance with chronic care management and care coordination. Her primary care provider will be notified of our unsuccessful attempts to maintain contact. The care management team will gladly outreach at any time in the future if she is interested in receiving assistance.   PLAN The care management team will gladly follow up with Susan Bowen after the primary care provider has a conversation with her regarding recommendation for care management engagement and subsequent re-referral for care management services.    [facility] [phone]

## 2020-02-17 ENCOUNTER — Telehealth: Payer: Self-pay | Admitting: Family Medicine

## 2020-02-22 ENCOUNTER — Ambulatory Visit: Payer: Medicare HMO

## 2020-02-29 ENCOUNTER — Telehealth: Payer: Self-pay | Admitting: Oncology

## 2020-02-29 NOTE — Telephone Encounter (Signed)
Prior authorization for CT scan is still pending. CT has been rescheduled for 03-07-20. Lab and see MD rescheduled for 03-09-20. Writer phoned patient on this date and informed of the above.

## 2020-03-02 ENCOUNTER — Ambulatory Visit: Payer: Medicare HMO

## 2020-03-06 ENCOUNTER — Ambulatory Visit: Payer: Medicare Other | Admitting: Oncology

## 2020-03-06 ENCOUNTER — Other Ambulatory Visit: Payer: Medicare Other

## 2020-03-07 ENCOUNTER — Other Ambulatory Visit: Payer: Self-pay

## 2020-03-07 ENCOUNTER — Ambulatory Visit
Admission: RE | Admit: 2020-03-07 | Discharge: 2020-03-07 | Disposition: A | Discharge status: Home / Self Care | Payer: Medicare HMO | Source: Ambulatory Visit | Attending: Oncology | Admitting: Oncology

## 2020-03-07 DIAGNOSIS — R591 Generalized enlarged lymph nodes: Secondary | ICD-10-CM | POA: Diagnosis not present

## 2020-03-07 DIAGNOSIS — C7B00 Secondary carcinoid tumors, unspecified site: Secondary | ICD-10-CM

## 2020-03-07 DIAGNOSIS — R197 Diarrhea, unspecified: Secondary | ICD-10-CM | POA: Insufficient documentation

## 2020-03-07 DIAGNOSIS — C7A019 Malignant carcinoid tumor of the small intestine, unspecified portion: Secondary | ICD-10-CM | POA: Diagnosis not present

## 2020-03-07 LAB — POCT I-STAT CREATININE: Creatinine, Ser: 1.5 mg/dL — ABNORMAL HIGH (ref 0.44–1.00)

## 2020-03-07 MED ORDER — IOHEXOL 300 MG/ML  SOLN
75.0000 mL | Freq: Once | INTRAMUSCULAR | Status: AC | PRN
  Administered 2020-03-07: 14:00:00 75 mL via INTRAVENOUS

## 2020-03-09 ENCOUNTER — Other Ambulatory Visit: Payer: Self-pay

## 2020-03-09 ENCOUNTER — Inpatient Hospital Stay: Payer: Medicare HMO | Attending: Oncology

## 2020-03-09 ENCOUNTER — Other Ambulatory Visit: Payer: Self-pay | Admitting: *Deleted

## 2020-03-09 ENCOUNTER — Inpatient Hospital Stay (HOSPITAL_BASED_OUTPATIENT_CLINIC_OR_DEPARTMENT_OTHER): Payer: Medicare HMO | Admitting: Oncology

## 2020-03-09 VITALS — BP 107/61 | HR 80 | Temp 96.5°F | Resp 18 | Wt 214.6 lb

## 2020-03-09 DIAGNOSIS — Z7901 Long term (current) use of anticoagulants: Secondary | ICD-10-CM | POA: Insufficient documentation

## 2020-03-09 DIAGNOSIS — Z7984 Long term (current) use of oral hypoglycemic drugs: Secondary | ICD-10-CM | POA: Insufficient documentation

## 2020-03-09 DIAGNOSIS — E669 Obesity, unspecified: Secondary | ICD-10-CM | POA: Insufficient documentation

## 2020-03-09 DIAGNOSIS — C7B00 Secondary carcinoid tumors, unspecified site: Secondary | ICD-10-CM

## 2020-03-09 DIAGNOSIS — R590 Localized enlarged lymph nodes: Secondary | ICD-10-CM | POA: Diagnosis not present

## 2020-03-09 DIAGNOSIS — M810 Age-related osteoporosis without current pathological fracture: Secondary | ICD-10-CM | POA: Insufficient documentation

## 2020-03-09 DIAGNOSIS — I272 Pulmonary hypertension, unspecified: Secondary | ICD-10-CM | POA: Insufficient documentation

## 2020-03-09 DIAGNOSIS — E785 Hyperlipidemia, unspecified: Secondary | ICD-10-CM | POA: Diagnosis not present

## 2020-03-09 DIAGNOSIS — E559 Vitamin D deficiency, unspecified: Secondary | ICD-10-CM | POA: Insufficient documentation

## 2020-03-09 DIAGNOSIS — Z8673 Personal history of transient ischemic attack (TIA), and cerebral infarction without residual deficits: Secondary | ICD-10-CM | POA: Insufficient documentation

## 2020-03-09 DIAGNOSIS — Z79899 Other long term (current) drug therapy: Secondary | ICD-10-CM | POA: Diagnosis not present

## 2020-03-09 DIAGNOSIS — K529 Noninfective gastroenteritis and colitis, unspecified: Secondary | ICD-10-CM | POA: Insufficient documentation

## 2020-03-09 DIAGNOSIS — I700 Atherosclerosis of aorta: Secondary | ICD-10-CM | POA: Insufficient documentation

## 2020-03-09 DIAGNOSIS — E039 Hypothyroidism, unspecified: Secondary | ICD-10-CM | POA: Diagnosis not present

## 2020-03-09 DIAGNOSIS — C7A019 Malignant carcinoid tumor of the small intestine, unspecified portion: Secondary | ICD-10-CM | POA: Insufficient documentation

## 2020-03-09 DIAGNOSIS — G4700 Insomnia, unspecified: Secondary | ICD-10-CM | POA: Insufficient documentation

## 2020-03-09 DIAGNOSIS — I480 Paroxysmal atrial fibrillation: Secondary | ICD-10-CM | POA: Diagnosis not present

## 2020-03-09 DIAGNOSIS — I129 Hypertensive chronic kidney disease with stage 1 through stage 4 chronic kidney disease, or unspecified chronic kidney disease: Secondary | ICD-10-CM | POA: Diagnosis not present

## 2020-03-09 DIAGNOSIS — R918 Other nonspecific abnormal finding of lung field: Secondary | ICD-10-CM | POA: Diagnosis not present

## 2020-03-09 DIAGNOSIS — E1122 Type 2 diabetes mellitus with diabetic chronic kidney disease: Secondary | ICD-10-CM | POA: Insufficient documentation

## 2020-03-09 LAB — CBC WITH DIFFERENTIAL/PLATELET
Abs Immature Granulocytes: 0.03 10*3/uL (ref 0.00–0.07)
Basophils Absolute: 0 10*3/uL (ref 0.0–0.1)
Basophils Relative: 0 %
Eosinophils Absolute: 0.2 10*3/uL (ref 0.0–0.5)
Eosinophils Relative: 2 %
HCT: 36.7 % (ref 36.0–46.0)
Hemoglobin: 11.4 g/dL — ABNORMAL LOW (ref 12.0–15.0)
Immature Granulocytes: 0 %
Lymphocytes Relative: 26 %
Lymphs Abs: 2.6 10*3/uL (ref 0.7–4.0)
MCH: 26.1 pg (ref 26.0–34.0)
MCHC: 31.1 g/dL (ref 30.0–36.0)
MCV: 84 fL (ref 80.0–100.0)
Monocytes Absolute: 0.6 10*3/uL (ref 0.1–1.0)
Monocytes Relative: 6 %
Neutro Abs: 6.5 10*3/uL (ref 1.7–7.7)
Neutrophils Relative %: 66 %
Platelets: 287 10*3/uL (ref 150–400)
RBC: 4.37 MIL/uL (ref 3.87–5.11)
RDW: 15.9 % — ABNORMAL HIGH (ref 11.5–15.5)
WBC: 10 10*3/uL (ref 4.0–10.5)
nRBC: 0 % (ref 0.0–0.2)

## 2020-03-09 LAB — COMPREHENSIVE METABOLIC PANEL
ALT: 12 U/L (ref 0–44)
AST: 16 U/L (ref 15–41)
Albumin: 3.7 g/dL (ref 3.5–5.0)
Alkaline Phosphatase: 53 U/L (ref 38–126)
Anion gap: 11 (ref 5–15)
BUN: 15 mg/dL (ref 8–23)
CO2: 27 mmol/L (ref 22–32)
Calcium: 9.3 mg/dL (ref 8.9–10.3)
Chloride: 101 mmol/L (ref 98–111)
Creatinine, Ser: 1.14 mg/dL — ABNORMAL HIGH (ref 0.44–1.00)
GFR calc Af Amer: 50 mL/min — ABNORMAL LOW (ref >60)
GFR calc non Af Amer: 44 mL/min — ABNORMAL LOW (ref >60)
Glucose, Bld: 271 mg/dL — ABNORMAL HIGH (ref 70–99)
Potassium: 3.9 mmol/L (ref 3.5–5.1)
Sodium: 139 mmol/L (ref 135–145)
Total Bilirubin: 0.7 mg/dL (ref 0.3–1.2)
Total Protein: 6.9 g/dL (ref 6.5–8.1)

## 2020-03-09 NOTE — Progress Notes (Signed)
Patient here for oncology follow-up appointment, expresses no complaints or concerns at this time.    

## 2020-03-10 ENCOUNTER — Other Ambulatory Visit: Payer: Self-pay | Admitting: Cardiovascular Disease

## 2020-03-10 NOTE — Progress Notes (Signed)
Hematology/Oncology Consult note [facility]  Telephone:([phone] [phone]  Patient Care Team: Steele Sizer, MD as PCP - General (Family Medicine) Wellington Hampshire, MD as PCP - Cardiology (Cardiology) Sindy Guadeloupe, MD as Consulting Physician (Oncology) Neldon Labella, RN as Case Manager   Name of the patient: Susan Bowen  [id number]  1932-07-26   Date of visit: 03/10/20  Diagnosis- intra abdominal adenopathy- metastatic carcinoid  Chief complaint/ Reason for visit-discuss CT scan results and further management  Heme/Onc history: Patient is a 86 year old female who underwent CT renal stone study for flank pain. CT scan showed pathologically enlarged lymph nodes in the right lower quadrant measuring up to 2.6 cm. Findings concerning for lymphoma or malignant process. Patient's past medical history significant for pulmonary hypertension, CKD, hypothyroidism among other medical problems. She did not have any B symptoms. This was followed by a PET CT scan Which showed 3 prominent soft tissue nodule/lymph nodes one in the mesentery at 2.1 cm with an SUV of 2.8. Adjacent soft tissue nodule/lymph node in the right iliac fossa measuring 3.1 cm and SUV of 2.8. Predominantly fat attenuation mesenteric nodule in the left lower quadrant measuring 2 cm without any significant FDG uptake   Interval history-patient is doing well for her age and denies any complaints at this time.  She does get diarrhea which is not frequent maybe once or twice a month.  Energy and appetite is stable.  ECOG PS- 1 Pain scale- 0   Review of systems- Review of Systems  Constitutional: Negative for chills, fever, malaise/fatigue and weight loss.  HENT: Negative for congestion, ear discharge and nosebleeds.   Eyes: Negative for blurred vision.  Respiratory: Negative for cough, hemoptysis, sputum production, shortness of breath and wheezing.   Cardiovascular:  Negative for chest pain, palpitations, orthopnea and claudication.  Gastrointestinal: Negative for abdominal pain, blood in stool, constipation, diarrhea, heartburn, melena, nausea and vomiting.  Genitourinary: Negative for dysuria, flank pain, frequency, hematuria and urgency.  Musculoskeletal: Negative for back pain, joint pain and myalgias.  Skin: Negative for rash.  Neurological: Negative for dizziness, tingling, focal weakness, seizures, weakness and headaches.  Endo/Heme/Allergies: Does not bruise/bleed easily.  Psychiatric/Behavioral: Negative for depression and suicidal ideas. The patient does not have insomnia.      Allergies  Allergen Reactions  . Augmentin [Amoxicillin-Pot Clavulanate] Itching    Has patient had a PCN reaction causing immediate rash, facial/tongue/throat swelling, SOB or lightheadedness with hypotension: No Has patient had a PCN reaction causing severe rash involving mucus membranes or skin necrosis: No Has patient had a PCN reaction that required hospitalization: No Has patient had a PCN reaction occurring within the last 10 years: No If all of the above answers are "NO", then may proceed with Cephalosporin use.      Past Medical History:  Diagnosis Date  . Allergy   . Back spasm   . Bunion   . Carpal tunnel syndrome   . Cataracta   . Diabetes mellitus without complication ([place])   . Generalized osteoarthritis   . Gout   . Hemorrhoids without complication   . Hyperlipidemia   . Hypertension   . Hypothyroidism   . Impingement syndrome of right shoulder   . Insomnia   . Lipoma   . Lung nodule   . Metastatic carcinoid tumor ([place]) 03/2016  . Neuritis or radiculitis due to rupture of lumbar intervertebral disc   . Obesity   . Osteoporosis   . PAF (  paroxysmal atrial fibrillation) ([place])    a. 03/2018 AF w/ RVR-->converted spont.  CHA2DS2VASc = 7-->Eliquis.  . Proteinuria   . Rotator cuff tear   . Systolic murmur    a. 06/1447 Echo: EF 60-65%, no  rwma, Gr1 DD, mild MR, mildly dil LA. Nl RV fxn. PASP 23mHg.  .Marland KitchenTIA (transient ischemic attack)   . TIA (transient ischemic attack) 1998   small TIA.  no residual effects  . Tinnitus of both ears   . Unspecified glaucoma(365.9)   . Vitamin D deficiency   . Wedge compression fracture of t11-T12 vertebra, sequela      Past Surgical History:  Procedure Laterality Date  . ABDOMINAL HYSTERECTOMY  1986  . APPENDECTOMY    . BREAST SURGERY Bilateral 1983   biopsy of each side, negative  . CARPAL TUNNEL RELEASE    . CATARACT EXTRACTION W/PHACO Left 09/03/2017   Procedure: CATARACT EXTRACTION PHACO AND INTRAOCULAR LENS PLACEMENT (IOC)-LEFT DIABETIC;  Surgeon: PBirder Robson MD;  Location: [facility] ORS;  Service: Ophthalmology;  Laterality: Left;  [place]:10.2 AP% 16.7 CDE 11.71 Fluid Pack Lot # [id number] . CATARACT EXTRACTION W/PHACO Right 09/30/2017   Procedure: CATARACT EXTRACTION PHACO AND INTRAOCULAR LENS PLACEMENT (IOC);  Surgeon: PBirder Robson MD;  Location: [facility] ORS;  Service: Ophthalmology;  Laterality: Right;  [place]:35.1 AP% 13.1 CDE 12.44 Fluid pack Lot # [id number]  . COLON SURGERY    . EYE SURGERY Left 09/03/2017   Cataract extraction  . FEMUR FRACTURE SURGERY Left   . FRACTURE SURGERY  2001   left femur  . HEMORROIDECTOMY    . polyp removed from vocal cord      Social History   Socioeconomic History  . Marital status: Divorced    Spouse name: Not on file  . Number of children: 6  . Years of education: Not on file  . Highest education level: 10th grade  Occupational History  . Not on file  Tobacco Use  . Smoking status: Never Smoker  . Smokeless tobacco: Never Used  Substance and Sexual Activity  . Alcohol use: No    Alcohol/week: 0.0 standard drinks  . Drug use: No  . Sexual activity: Not Currently  Other Topics Concern  . Not on file  Social History Narrative  . Not on file   Social Determinants of Health   Financial Resource Strain:   . Difficulty  of Paying Living Expenses:   Food Insecurity:   . Worried About RCharity fundraiserin the Last Year:   . RArboriculturistin the Last Year:   Transportation Needs:   . LFilm/video editor(Medical):   .Marland KitchenLack of Transportation (Non-Medical):   Physical Activity:   . Days of Exercise per Week:   . Minutes of Exercise per Session:   Stress:   . Feeling of Stress :   Social Connections:   . Frequency of Communication with Friends and Family:   . Frequency of Social Gatherings with Friends and Family:   . Attends Religious Services:   . Active Member of Clubs or Organizations:   . Attends CArchivistMeetings:   .Marland KitchenMarital Status:   Intimate Partner Violence:   . Fear of Current or Ex-Partner:   . Emotionally Abused:   .Marland KitchenPhysically Abused:   . Sexually Abused:     Family History  Problem Relation Age of Onset  . Diabetes Mother   . Hypertension Mother   . Dementia Mother   .  Hypertension Son      Current Outpatient Medications:  .  acetaminophen (TYLENOL) 500 MG tablet, Take 500 mg by mouth every 6 (six) hours as needed., Disp: , Rfl:  .  Acetaminophen-Codeine 300-30 MG tablet, Take 1 tablet by mouth every 6 (six) hours as needed for pain., Disp: 30 tablet, Rfl: 0 .  Blood Glucose Monitoring Suppl (ACCU-CHEK AVIVA PLUS) w/Device KIT, 1 kit by Does not apply route 3 (three) times daily., Disp: 1 kit, Rfl: 0 .  carvedilol (COREG) 25 MG tablet, Take 1 tablet (25 mg total) by mouth 2 (two) times daily. New dose, Disp: 180 tablet, Rfl: 3 .  COMBIGAN 0.2-0.5 % ophthalmic solution, Place 1 drop into both eyes 2 (two) times daily., Disp: , Rfl:  .  Cyanocobalamin (B-12) 1000 MCG TABS, Take 1,000 mcg daily by mouth., Disp: , Rfl:  .  diclofenac Sodium (VOLTAREN) 1 % GEL, Apply topically., Disp: , Rfl:  .  ELIQUIS 5 MG TABS tablet, TAKE 1 TABLET BY MOUTH TWICE A DAY, Disp: 180 tablet, Rfl: 1 .  empagliflozin (JARDIANCE) 25 MG TABS tablet, Take 25 mg by mouth daily., Disp: 90  tablet, Rfl: 1 .  glucose blood (ACCU-CHEK AVIVA PLUS) test strip, Check fasting glucose once each morning, and check glucose as needed., Disp: 100 each, Rfl: 12 .  Lancets (ACCU-CHEK MULTICLIX) lancets, Use as instructed, Disp: 204 each, Rfl: 12 .  levothyroxine (SYNTHROID) 50 MCG tablet, Take 1 tablet (50 mcg total) by mouth daily before breakfast., Disp: 90 tablet, Rfl: 1 .  loratadine (CLARITIN) 10 MG tablet, TAKE 1 TABLET BY MOUTH EVERY DAY, Disp: 90 tablet, Rfl: 2 .  metFORMIN (GLUCOPHAGE) 850 MG tablet, Take 1 tablet (850 mg total) by mouth 2 (two) times daily., Disp: 180 tablet, Rfl: 1 .  pantoprazole (PROTONIX) 40 MG tablet, Take 40 mg by mouth daily., Disp: , Rfl:  .  pravastatin (PRAVACHOL) 40 MG tablet, TAKE 1 TABLET BY MOUTH EVERY DAY, Disp: 90 tablet, Rfl: 1 .  Travoprost, BAK Free, (TRAVATAN) 0.004 % SOLN ophthalmic solution, Place 1 drop into both eyes at bedtime., Disp: , Rfl:  .  TRULICITY 1.5 ZJ/6.7HA SOPN, INJECT 1.5 MG INTO THE SKIN EVERY THURSDAY., Disp: 6 pen, Rfl: 1 .  Vitamin D, Ergocalciferol, (DRISDOL) 1.25 MG (50000 UNIT) CAPS capsule, Weekly, Disp: 12 capsule, Rfl: 1 .  COLCRYS 0.6 MG tablet, TAKE 1-2 TABLETS (0.6-1.2 MG TOTAL) BY MOUTH DAILY AS NEEDED. FOR GOUT, NOT DAILY (Patient not taking: Reported on 03/09/2020), Disp: 30 tablet, Rfl: 0 .  olmesartan-hydrochlorothiazide (BENICAR HCT) 40-25 MG tablet, TAKE 1 TABLET BY MOUTH EVERY DAY, Disp: 90 tablet, Rfl: 0 .  traZODone (DESYREL) 50 MG tablet, Take 0.5-1 tablets (25-50 mg total) by mouth at bedtime as needed for sleep. (Patient not taking: Reported on 03/09/2020), Disp: 90 tablet, Rfl: 0  Physical exam:  Vitals:   03/09/20 1000  BP: 107/61  Pulse: 80  Resp: 18  Temp: (!) 96.5 F (35.8 C)  TempSrc: Tympanic  SpO2: 98%  Weight: 214 lb 9.6 oz (97.3 kg)   Physical Exam Constitutional:      General: She is not in acute distress. Eyes:     Pupils: Pupils are equal, round, and reactive to light.  Cardiovascular:      Rate and Rhythm: Normal rate and regular rhythm.     Heart sounds: Normal heart sounds.  Pulmonary:     Effort: Pulmonary effort is normal.     Breath sounds: Normal breath  sounds.  Abdominal:     General: Bowel sounds are normal.     Palpations: Abdomen is soft.  Skin:    General: Skin is warm and dry.  Neurological:     Mental Status: She is alert and oriented to person, place, and time.      CMP Latest Ref Rng & Units 03/09/2020  Glucose 70 - 99 mg/dL 271(H)  BUN 8 - 23 mg/dL 15  Creatinine 0.44 - 1.00 mg/dL 1.14(H)  Sodium 135 - 145 mmol/L 139  Potassium 3.5 - 5.1 mmol/L 3.9  Chloride 98 - 111 mmol/L 101  CO2 22 - 32 mmol/L 27  Calcium 8.9 - 10.3 mg/dL 9.3  Total Protein 6.5 - 8.1 g/dL 6.9  Total Bilirubin 0.3 - 1.2 mg/dL 0.7  Alkaline Phos 38 - 126 U/L 53  AST 15 - 41 U/L 16  ALT 0 - 44 U/L 12   CBC Latest Ref Rng & Units 03/09/2020  WBC 4.0 - 10.5 K/uL 10.0  Hemoglobin 12.0 - 15.0 g/dL 11.4(L)  Hematocrit 36.0 - 46.0 % 36.7  Platelets 150 - 400 K/uL 287    No images are attached to the encounter.  CT Chest W Contrast  Result Date: 03/07/2020 CLINICAL DATA:  Restaging well differentiated neuroendocrine tumor of the small bowel. EXAM: CT CHEST, ABDOMEN, AND PELVIS WITH CONTRAST TECHNIQUE: Multidetector CT imaging of the chest, abdomen and pelvis was performed following the standard protocol during bolus administration of intravenous contrast. CONTRAST:  24m OMNIPAQUE IOHEXOL 300 MG/ML  SOLN COMPARISON:  08/30/2019 FINDINGS: CT CHEST FINDINGS Cardiovascular: The heart size appears normal. No pericardial effusion. Aortic atherosclerosis. The main pulmonary artery measures 3.8 cm in diameter. Mediastinum/Nodes: No enlarged mediastinal, hilar, or axillary lymph nodes. Thyroid gland, trachea, and esophagus demonstrate no significant findings. Lungs/Pleura: No pleural effusion identified. A few small scattered lung nodules are noted which are unchanged from previous exam. No  new lung nodules. Musculoskeletal: Unchanged focal area of sclerosis involving left anterior rib, image 65/4 and image 45/4. Favor benign bone islands. No aggressive lytic or sclerotic bone lesions identified at this time. CT ABDOMEN PELVIS FINDINGS Hepatobiliary: No focal liver abnormality. Gallbladder unremarkable. No biliary dilatation. Pancreas: Unremarkable. No pancreatic ductal dilatation or surrounding inflammatory changes. Spleen: Normal in size without focal abnormality. Adrenals/Urinary Tract: Normal appearance of the adrenal glands. The right kidney is normal. The left kidney is normal. No mass or hydronephrosis identified. Urinary bladder negative. Stomach/Bowel: The stomach is unremarkable. The small bowel loops appear normal. Normal appearance of the colon. Vascular/Lymphatic: Aortic atherosclerosis. No aneurysm. No abdominal adenopathy. No pelvic or inguinal adenopathy. Reproductive: Status post hysterectomy. No adnexal masses. Other: Well-circumscribed soft tissue attenuating solid nodules in the right lower quadrant mesentery are again noted. The larger nodule measures 3.6 x by 2.6 by 3.3 (volume = 16), image 85/2. Previously 3.3 x 2.9 by 3.6 cm (volume = 18 cm^3). Adjacent, smaller nodule measures 2.7 x 2.0 by 2.5 cm (volume = 7.1 cm^3), image 85/2. Previously 2.4 x 2.3 by 2.4 cm (volume = 6.9 cm^3). No new sites of disease identified. Musculoskeletal: No acute or significant osseous findings. IMPRESSION: 1. No significant interval change in the appearance of right lower quadrant DOTATATE avid mesenteric soft tissue nodules. 2. No new sites of disease. 3. Aortic atherosclerosis. 4. Enlarged main pulmonary artery, which may be seen with pulmonary arterial hypertension. Aortic Atherosclerosis (ICD10-I70.0). Electronically Signed   By: TKerby MoorsM.D.   On: 03/07/2020 16:47   CT Abdomen Pelvis W  Contrast  Result Date: 03/07/2020 CLINICAL DATA:  Restaging well differentiated neuroendocrine tumor  of the small bowel. EXAM: CT CHEST, ABDOMEN, AND PELVIS WITH CONTRAST TECHNIQUE: Multidetector CT imaging of the chest, abdomen and pelvis was performed following the standard protocol during bolus administration of intravenous contrast. CONTRAST:  69m OMNIPAQUE IOHEXOL 300 MG/ML  SOLN COMPARISON:  08/30/2019 FINDINGS: CT CHEST FINDINGS Cardiovascular: The heart size appears normal. No pericardial effusion. Aortic atherosclerosis. The main pulmonary artery measures 3.8 cm in diameter. Mediastinum/Nodes: No enlarged mediastinal, hilar, or axillary lymph nodes. Thyroid gland, trachea, and esophagus demonstrate no significant findings. Lungs/Pleura: No pleural effusion identified. A few small scattered lung nodules are noted which are unchanged from previous exam. No new lung nodules. Musculoskeletal: Unchanged focal area of sclerosis involving left anterior rib, image 65/4 and image 45/4. Favor benign bone islands. No aggressive lytic or sclerotic bone lesions identified at this time. CT ABDOMEN PELVIS FINDINGS Hepatobiliary: No focal liver abnormality. Gallbladder unremarkable. No biliary dilatation. Pancreas: Unremarkable. No pancreatic ductal dilatation or surrounding inflammatory changes. Spleen: Normal in size without focal abnormality. Adrenals/Urinary Tract: Normal appearance of the adrenal glands. The right kidney is normal. The left kidney is normal. No mass or hydronephrosis identified. Urinary bladder negative. Stomach/Bowel: The stomach is unremarkable. The small bowel loops appear normal. Normal appearance of the colon. Vascular/Lymphatic: Aortic atherosclerosis. No aneurysm. No abdominal adenopathy. No pelvic or inguinal adenopathy. Reproductive: Status post hysterectomy. No adnexal masses. Other: Well-circumscribed soft tissue attenuating solid nodules in the right lower quadrant mesentery are again noted. The larger nodule measures 3.6 x by 2.6 by 3.3 (volume = 16), image 85/2. Previously 3.3 x 2.9 by  3.6 cm (volume = 18 cm^3). Adjacent, smaller nodule measures 2.7 x 2.0 by 2.5 cm (volume = 7.1 cm^3), image 85/2. Previously 2.4 x 2.3 by 2.4 cm (volume = 6.9 cm^3). No new sites of disease identified. Musculoskeletal: No acute or significant osseous findings. IMPRESSION: 1. No significant interval change in the appearance of right lower quadrant DOTATATE avid mesenteric soft tissue nodules. 2. No new sites of disease. 3. Aortic atherosclerosis. 4. Enlarged main pulmonary artery, which may be seen with pulmonary arterial hypertension. Aortic Atherosclerosis (ICD10-I70.0). Electronically Signed   By: TKerby MoorsM.D.   On: 03/07/2020 16:47     Assessment and plan- Patient is a 86y.o. female with 2 small bowel soft tissue nodules likely carcinoid based on dotatate scan.  This is a routine follow-up visit  Patient was incidentally noted to have intra-abdominal soft tissue nodules 3.2 and 2.3 cm in size.  This was back in May 2020.  Subsequently she has had scans including a dotatate scan and CT scans in October 2020 which did not show any significant increase in the size of these nodules.  Her most recent CT chest abdomen pelvis with contrast on 03/08/2019 again shows no significant change in the size of the right lower quadrant mesenteric soft tissue nodules.  Also she does not have any symptoms of carcinoid syndrome including flushing tachycardia or diarrhea.  She has mild baseline chronic diarrhea which is under good control and maybe comes on once or twice a month.  She therefore does not require any octreotide for that at this time.  I will see her back in 6 months scans prior and labs CBC with differential CMP and chromogranin A.   Visit Diagnosis 1. Malignant carcinoid tumor of small intestine, unspecified location ([facility]      Dr. ARanda Evens MD, MPH [place]at  [facility] [phone] 03/10/2020 2:18 PM

## 2020-03-16 ENCOUNTER — Ambulatory Visit: Payer: Medicare HMO

## 2020-03-28 ENCOUNTER — Other Ambulatory Visit: Payer: Self-pay | Admitting: Family Medicine

## 2020-03-28 ENCOUNTER — Other Ambulatory Visit: Payer: Self-pay | Admitting: Cardiovascular Disease

## 2020-03-28 DIAGNOSIS — E785 Hyperlipidemia, unspecified: Secondary | ICD-10-CM

## 2020-03-28 DIAGNOSIS — J302 Other seasonal allergic rhinitis: Secondary | ICD-10-CM

## 2020-03-28 NOTE — Telephone Encounter (Signed)
Requested Prescriptions  Pending Prescriptions Disp Refills  . loratadine (CLARITIN) 10 MG tablet [Pharmacy Med Name: LORATADINE 10 MG TABLET] 90 tablet 2    Sig: TAKE 1 TABLET BY MOUTH EVERY DAY     Ear, Nose, and Throat:  Antihistamines Passed - 03/28/2020 11:28 AM      Passed - Valid encounter within last 12 months    Recent Outpatient Visits          2 months ago Diabetes mellitus with proteinuric diabetic nephropathy [facility])   [facility] [place], Drue Stager, MD   6 months ago Diabetes mellitus with proteinuric diabetic nephropathy [facility])   [facility] [place], Drue Stager, MD   10 months ago Diabetes mellitus with proteinuric diabetic nephropathy [facility])   [facility] Steele Sizer, MD   1 year ago Bloating   [facility] Steele Sizer, MD   1 year ago Paroxysmal A-fib [facility])   [facility] Steele Sizer, MD      Future Appointments            In 1 month Ancil Boozer, Drue Stager, MD [facility], [place]   In 3 months Mickle Plumb, Jacquelyn D, PA-C [facility]           . pravastatin (PRAVACHOL) 40 MG tablet [Pharmacy Med Name: PRAVASTATIN SODIUM 40 MG TAB] 90 tablet 1    Sig: TAKE 1 TABLET BY MOUTH EVERY DAY     Cardiovascular:  Antilipid - Statins Failed - 03/28/2020 11:28 AM      Failed - HDL in normal range and within 360 days    HDL  Date Value Ref Range Status  01/07/2020 49 (L) > OR = 50 mg/dL Final  12/19/2016 50 >39 mg/dL Final         Passed - Total Cholesterol in normal range and within 360 days    Cholesterol, Total  Date Value Ref Range Status  12/19/2016 146 100 - 199 mg/dL Final   Cholesterol  Date Value Ref Range Status  01/07/2020 144 <200 mg/dL Final         Passed - LDL in normal range and within 360 days    LDL Cholesterol (Calc)  Date Value Ref Range Status  01/07/2020 73 mg/dL (calc) Final    Comment:    Reference  range: <100 . Desirable range <100 mg/dL for primary prevention;   <70 mg/dL for patients with CHD or diabetic patients  with > or = 2 CHD risk factors. Marland Kitchen LDL-C is now calculated using the Martin-Hopkins  calculation, which is a validated novel method providing  better accuracy than the Friedewald equation in the  estimation of LDL-C.  Cresenciano Genre et al. Annamaria Helling. [id number]): [id number]  (http://education.[facility].com/faq/FAQ164)          Passed - Triglycerides in normal range and within 360 days    Triglycerides  Date Value Ref Range Status  01/07/2020 141 <150 mg/dL Final         Passed - Patient is not pregnant      Passed - Valid encounter within last 12 months    Recent Outpatient Visits          2 months ago Diabetes mellitus with proteinuric diabetic nephropathy [facility])   [facility] [place], Drue Stager, MD   6 months ago Diabetes mellitus with proteinuric diabetic nephropathy [facility])   [facility], Drue Stager, MD   10 months ago Diabetes mellitus with proteinuric  diabetic nephropathy [facility])   [facility] Steele Sizer, MD   1 year ago Bloating   [facility] Steele Sizer, MD   1 year ago Paroxysmal A-fib [facility])   [facility] Steele Sizer, MD      Future Appointments            In 1 month Ancil Boozer, Drue Stager, MD [facility], [place]   In 3 months Mickle Plumb, Jacquelyn D, PA-C [place], [place]

## 2020-04-24 ENCOUNTER — Other Ambulatory Visit: Payer: Self-pay | Admitting: Family Medicine

## 2020-04-24 DIAGNOSIS — M109 Gout, unspecified: Secondary | ICD-10-CM

## 2020-04-24 MED ORDER — PANTOPRAZOLE SODIUM 40 MG PO TBEC: 40.0000 mg | DELAYED_RELEASE_TABLET | Freq: Every day | ORAL | 0 refills | Status: DC

## 2020-04-24 MED ORDER — COLCHICINE 0.6 MG PO TABS: ORAL_TABLET | ORAL | 0 refills | Status: DC

## 2020-04-24 NOTE — Telephone Encounter (Signed)
Requested medication (s) are due for refill today: no  Requested medication (s) are on the active medication list: yes    Future visit scheduled: yes  Notes to clinic:  medication was last filled by historical provider  Review for refill   Requested Prescriptions  Pending Prescriptions Disp Refills   pantoprazole (PROTONIX) 40 MG tablet      Sig: Take 1 tablet (40 mg total) by mouth daily.      Gastroenterology: Proton Pump Inhibitors Passed - 04/24/2020  9:49 AM      Passed - Valid encounter within last 12 months    Recent Outpatient Visits           3 months ago Diabetes mellitus with proteinuric diabetic nephropathy [facility])   [facility] [place], Drue Stager, MD   7 months ago Diabetes mellitus with proteinuric diabetic nephropathy [facility])   [facility] Steele Sizer, MD   11 months ago Diabetes mellitus with proteinuric diabetic nephropathy [facility])   [facility] Steele Sizer, MD   1 year ago Bloating   [facility] Steele Sizer, MD   1 year ago Paroxysmal A-fib [facility])   [facility] Steele Sizer, MD       Future Appointments             In 2 weeks Steele Sizer, MD [facility], [place]   In 3 months Francis, Jacquelyn D, PA-C [place], [facility]             Signed Prescriptions Disp Refills   colchicine (COLCRYS) 0.6 MG tablet 30 tablet 0    Sig: TAKE 1-2 TABLETS (0.6-1.2 MG TOTAL) BY MOUTH DAILY AS NEEDED. FOR GOUT, NOT DAILY      Endocrinology:  Gout Agents Failed - 04/24/2020  9:49 AM      Failed - Uric Acid in normal range and within 360 days    Uric Acid, Serum  Date Value Ref Range Status  01/07/2020 8.0 (H) 2.5 - 7.0 mg/dL Final    Comment:    Therapeutic target for gout patients: <6.0 mg/dL .    Uric Acid  Date Value Ref Range Status  12/19/2016 8.5 (H) 2.5 - 7.1 mg/dL Final    Comment:                Therapeutic target for gout patients: <6.0          Failed - Cr in normal range and within 360 days    Creat  Date Value Ref Range Status  01/07/2020 1.30 (H) 0.60 - 0.88 mg/dL Final    Comment:    For patients >58 years of age, the reference limit for Creatinine is approximately 13% higher for people identified as African-American. .    Creatinine, Ser  Date Value Ref Range Status  03/09/2020 1.14 (H) 0.44 - 1.00 mg/dL Final   Creatinine, Urine  Date Value Ref Range Status  01/07/2020 100 20 - 275 mg/dL Final          Passed - Valid encounter within last 12 months    Recent Outpatient Visits           3 months ago Diabetes mellitus with proteinuric diabetic nephropathy [facility])   [facility] [place], Drue Stager, MD   7 months ago Diabetes mellitus with proteinuric diabetic nephropathy [facility])   [facility] Steele Sizer, MD   11 months ago Diabetes mellitus with proteinuric diabetic nephropathy (  [facility])   [facility] Steele Sizer, MD   1 year ago Bloating   [facility] Steele Sizer, MD   1 year ago Paroxysmal A-fib [facility])   [facility] Steele Sizer, MD       Future Appointments             In 2 weeks Steele Sizer, MD [facility] [place], [place]   In 3 months Mickle Plumb, Jacquelyn D, PA-C [place], [place]

## 2020-04-24 NOTE — Telephone Encounter (Signed)
Patient calling to request a refill on 2 medications COLCRYS 0.6 MG tablet and pantoprazole (PROTONIX) 40 MG  [id number] - [place], [place] - [address] Phone:  [phone]  Fax:  [phone]

## 2020-05-02 ENCOUNTER — Other Ambulatory Visit: Payer: Self-pay | Admitting: Family Medicine

## 2020-05-02 ENCOUNTER — Other Ambulatory Visit: Payer: Self-pay | Admitting: Cardiovascular Disease

## 2020-05-02 DIAGNOSIS — E1169 Type 2 diabetes mellitus with other specified complication: Secondary | ICD-10-CM

## 2020-05-02 DIAGNOSIS — F5101 Primary insomnia: Secondary | ICD-10-CM

## 2020-05-02 DIAGNOSIS — E1121 Type 2 diabetes mellitus with diabetic nephropathy: Secondary | ICD-10-CM

## 2020-05-02 DIAGNOSIS — E559 Vitamin D deficiency, unspecified: Secondary | ICD-10-CM

## 2020-05-02 DIAGNOSIS — M109 Gout, unspecified: Secondary | ICD-10-CM

## 2020-05-02 NOTE — Telephone Encounter (Signed)
Requested medication (s) are due for refill today:yes  Requested medication (s) are on the active medication list: yes  Last refill:  04/08/2020  Future visit scheduled: yes  Notes to clinic: this refill cannot be delegated    Requested Prescriptions  Pending Prescriptions Disp Refills   Vitamin D, Ergocalciferol, (DRISDOL) 1.25 MG (50000 UNIT) CAPS capsule [Pharmacy Med Name: VITAMIN D2 1.25MG(50,000 UNIT)] 12 capsule 1    Sig: TAKE 1 CAPSULE BY MOUTH WEEKLY      Endocrinology:  Vitamins - Vitamin D Supplementation Failed - 05/02/2020  9:41 AM      Failed - 50,000 IU strengths are not delegated      Failed - Phosphate in normal range and within 360 days    No results found for: PHOS        Failed - Vitamin D in normal range and within 360 days    Vit D, 25-Hydroxy  Date Value Ref Range Status  12/31/2017 43 30 - 100 ng/mL Final    Comment:    Vitamin D Status         25-OH Vitamin D: . Deficiency:                    <20 ng/mL Insufficiency:             20 - 29 ng/mL Optimal:                 > or = 30 ng/mL . For 25-OH Vitamin D testing on patients on  D2-supplementation and patients for whom quantitation  of D2 and D3 fractions is required, the QuestAssureD(TM) 25-OH VIT D, (D2,D3), LC/MS/MS is recommended: order  code [phone] (patients >51yr). . For more information on this test, go to: http://education.questdiagnostics.com/faq/FAQ163 (This link is being provided for  informational/educational purposes only.)           Passed - Ca in normal range and within 360 days    Calcium  Date Value Ref Range Status  03/09/2020 9.3 8.9 - 10.3 mg/dL Final   Calcium, Total  Date Value Ref Range Status  03/28/2014 8.9 8.5 - 10.1 mg/dL Final          Passed - Valid encounter within last 12 months    Recent Outpatient Visits           3 months ago Diabetes mellitus with proteinuric diabetic nephropathy ([facility]   [facility] St[facility]   7  months ago Diabetes mellitus with proteinuric diabetic nephropathy ([facility]   [facility]   11 months ago Diabetes mellitus with proteinuric diabetic nephropathy ([facility]   [facility]   1 year ago Bloating   [facility]   1 year ago Paroxysmal A-fib ([facility]   [facility]       Future Appointments             In 1 week  [facility] [place]  In 1 week SSteele Sizer MD [place]  In 2 months VMickle Plumb Jacquelyn D, PA-C [place] [facility]             Signed Prescriptions Disp Refills   pantoprazole (PROTONIX) 40 MG tablet 90 tablet 0    Sig: TAKE 1 TPerry     Gastroenterology: Proton Pump Inhibitors Passed -  05/02/2020  9:41 AM      Passed - Valid encounter within last 12 months    Recent Outpatient Visits           3 months ago Diabetes mellitus with proteinuric diabetic nephropathy [facility])   [facility] [place], Drue Stager, MD   7 months ago Diabetes mellitus with proteinuric diabetic nephropathy [facility])   [facility] [place], Drue Stager, MD   11 months ago Diabetes mellitus with proteinuric diabetic nephropathy [facility])   [facility] Steele Sizer, MD   1 year ago Bloating   [facility] Steele Sizer, MD   1 year ago Paroxysmal A-fib [facility])   [facility] Steele Sizer, MD       Future Appointments             In 1 week  [facility], [place]   In 1 week Steele Sizer, MD [place]   In 2 months [place], Jacquelyn D, PA-C [place], [facility]              traZODone (DESYREL) 50 MG tablet 90 tablet 0    Sig: TAKE 0.5-1 TABLETS (25-50 MG TOTAL) BY MOUTH AT BEDTIME AS NEEDED FOR  SLEEP.      Psychiatry: Antidepressants - Serotonin Modulator Passed - 05/02/2020  9:41 AM      Passed - Completed PHQ-2 or PHQ-9 in the last 360 days.      Passed - Valid encounter within last 6 months    Recent Outpatient Visits           3 months ago Diabetes mellitus with proteinuric diabetic nephropathy [facility])   [facility] [place], Drue Stager, MD   7 months ago Diabetes mellitus with proteinuric diabetic nephropathy [facility])   [facility] [place], Drue Stager, MD   11 months ago Diabetes mellitus with proteinuric diabetic nephropathy [facility])   [facility] Steele Sizer, MD   1 year ago Bloating   [facility] Steele Sizer, MD   1 year ago Paroxysmal A-fib [facility])   [facility] Steele Sizer, MD       Future Appointments             In 1 week  [facility], [place]   In 1 week Steele Sizer, MD [place]   In 2 months [place], Jacquelyn D, PA-C [place], [facility]              metFORMIN (GLUCOPHAGE) 850 MG tablet 180 tablet 0    Sig: TAKE 1 TABLET (850 MG TOTAL) BY MOUTH 2 (TWO) TIMES DAILY.      Endocrinology:  Diabetes - Biguanides Failed - 05/02/2020  9:41 AM      Failed - Cr in normal range and within 360 days    Creat  Date Value Ref Range Status  01/07/2020 1.30 (H) 0.60 - 0.88 mg/dL Final    Comment:    For patients >63 years of age, the reference limit for Creatinine is approximately 13% higher for people identified as African-American. .    Creatinine, Ser  Date Value Ref Range Status  03/09/2020 1.14 (H) 0.44 - 1.00 mg/dL Final   Creatinine, Urine  Date Value Ref Range Status  01/07/2020 100 20 - 275 mg/dL Final          Failed - eGFR in normal range and within  360 days    GFR, Est African American  Date Value Ref Range Status  01/07/2020 43 (L) > OR = 60 mL/min/1.3m Final   GFR calc Af  Amer  Date Value Ref Range Status  03/09/2020 50 (L) >60 mL/min Final   GFR, Est Non African American  Date Value Ref Range Status  01/07/2020 37 (L) > OR = 60 mL/min/1.757mFinal   GFR calc non Af Amer  Date Value Ref Range Status  03/09/2020 44 (L) >60 mL/min Final          Passed - HBA1C is between 0 and 7.9 and within 180 days    Hemoglobin A1C  Date Value Ref Range Status  01/07/2020 7.8 (A) 4.0 - 5.6 % Final   HbA1c, POC (controlled diabetic range)  Date Value Ref Range Status  06/26/2018 7.5 (A) 0.0 - 7.0 % Final   Hgb A1c MFr Bld  Date Value Ref Range Status  12/28/2018 8.5 (H) <5.7 % of total Hgb Final    Comment:    For someone without known diabetes, a hemoglobin A1c value of 6.5% or greater indicates that they may have  diabetes and this should be confirmed with a follow-up  test. . For someone with known diabetes, a value <7% indicates  that their diabetes is well controlled and a value  greater than or equal to 7% indicates suboptimal  control. A1c targets should be individualized based on  duration of diabetes, age, comorbid conditions, and  other considerations. . Currently, no consensus exists regarding use of hemoglobin A1c for diagnosis of diabetes for children. . Renella Cunas Valid encounter within last 6 months    Recent Outpatient Visits           3 months ago Diabetes mellitus with proteinuric diabetic nephropathy ([facility]  [facility] CenteroCalciumKrDrue StagerMD   7 months ago Diabetes mellitus with proteinuric diabetic nephropathy ([facility]  CHP[facility] CenteroSteele SizerMD   11 months ago Diabetes mellitus with proteinuric diabetic nephropathy ([facility]  CH[facility] CenteroStee[facility] SizerMD   1 year ago Bloating   [facility]ero[facility] SizerMD   1 year ago [facility]ox[facility]mal A-fib ([facility]  CH[facility] CenteroSteele SizerMD       Future Appointments              In 1 week  [facility]PE[facility] In 1 week SoSteele SizerMD CH[facility]PE[facility] In 2 months ViMickle PlumbJacquelyn D, PA-C CH[facility]bert[facility]

## 2020-05-02 NOTE — Telephone Encounter (Signed)
Eliquis 5mg  refill request received. Patient is 86 years old, weight-97.3kg, Crea-1.14 on 03/09/2020, Diagnosis-Afib, and last seen by Dr. Fletcher Anon on 01/20/2020. Dose is appropriate based on dosing criteria. Will send in refill to requested pharmacy.

## 2020-05-02 NOTE — Telephone Encounter (Signed)
Please review for refill. Thanks!  

## 2020-05-09 ENCOUNTER — Encounter: Payer: Self-pay | Admitting: Family Medicine

## 2020-05-09 ENCOUNTER — Ambulatory Visit (INDEPENDENT_AMBULATORY_CARE_PROVIDER_SITE_OTHER): Payer: Medicare HMO

## 2020-05-09 ENCOUNTER — Ambulatory Visit (INDEPENDENT_AMBULATORY_CARE_PROVIDER_SITE_OTHER): Payer: Medicare HMO | Admitting: Family Medicine

## 2020-05-09 ENCOUNTER — Other Ambulatory Visit: Payer: Self-pay

## 2020-05-09 VITALS — BP 128/78 | HR 91 | Temp 96.8°F | Resp 16 | Ht 64.0 in | Wt 213.0 lb

## 2020-05-09 VITALS — BP 128/78 | HR 91 | Temp 96.8°F | Resp 16 | Ht 64.0 in | Wt 213.2 lb

## 2020-05-09 DIAGNOSIS — E785 Hyperlipidemia, unspecified: Secondary | ICD-10-CM

## 2020-05-09 DIAGNOSIS — I10 Essential (primary) hypertension: Secondary | ICD-10-CM | POA: Diagnosis not present

## 2020-05-09 DIAGNOSIS — E890 Postprocedural hypothyroidism: Secondary | ICD-10-CM

## 2020-05-09 DIAGNOSIS — E1121 Type 2 diabetes mellitus with diabetic nephropathy: Secondary | ICD-10-CM

## 2020-05-09 DIAGNOSIS — Z0000 Encounter for general adult medical examination without abnormal findings: Secondary | ICD-10-CM | POA: Diagnosis not present

## 2020-05-09 DIAGNOSIS — E559 Vitamin D deficiency, unspecified: Secondary | ICD-10-CM

## 2020-05-09 DIAGNOSIS — I480 Paroxysmal atrial fibrillation: Secondary | ICD-10-CM

## 2020-05-09 DIAGNOSIS — M109 Gout, unspecified: Secondary | ICD-10-CM

## 2020-05-09 DIAGNOSIS — I700 Atherosclerosis of aorta: Secondary | ICD-10-CM | POA: Diagnosis not present

## 2020-05-09 DIAGNOSIS — N1831 Chronic kidney disease, stage 3a: Secondary | ICD-10-CM

## 2020-05-09 DIAGNOSIS — E1169 Type 2 diabetes mellitus with other specified complication: Secondary | ICD-10-CM | POA: Diagnosis not present

## 2020-05-09 DIAGNOSIS — K219 Gastro-esophageal reflux disease without esophagitis: Secondary | ICD-10-CM

## 2020-05-09 LAB — BASIC METABOLIC PANEL WITH GFR
BUN/Creatinine Ratio: 13 (calc) (ref 6–22)
BUN: 15 mg/dL (ref 7–25)
CO2: 27 mmol/L (ref 20–32)
Calcium: 9.8 mg/dL (ref 8.6–10.4)
Chloride: 105 mmol/L (ref 98–110)
Creat: 1.12 mg/dL — ABNORMAL HIGH (ref 0.60–0.88)
GFR, Est African American: 52 mL/min/{1.73_m2} — ABNORMAL LOW (ref >=60)
GFR, Est Non African American: 44 mL/min/{1.73_m2} — ABNORMAL LOW (ref >=60)
Glucose, Bld: 153 mg/dL — ABNORMAL HIGH (ref 65–99)
Potassium: 4.1 mmol/L (ref 3.5–5.3)
Sodium: 141 mmol/L (ref 135–146)

## 2020-05-09 LAB — POCT GLYCOSYLATED HEMOGLOBIN (HGB A1C): Hemoglobin A1C: 8 % — AB (ref 4.0–5.6)

## 2020-05-09 LAB — URIC ACID: Uric Acid, Serum: 6.6 mg/dL (ref 2.5–7.0)

## 2020-05-09 MED ORDER — TRULICITY 3 MG/0.5ML ~~LOC~~ SOAJ: 3.0000 mg | SUBCUTANEOUS | 1 refills | Status: DC

## 2020-05-09 MED ORDER — PANTOPRAZOLE SODIUM 40 MG PO TBEC: 40.0000 mg | DELAYED_RELEASE_TABLET | Freq: Every day | ORAL | 1 refills | Status: DC

## 2020-05-09 MED ORDER — OLMESARTAN MEDOXOMIL 20 MG PO TABS: 20.0000 mg | ORAL_TABLET | Freq: Every day | ORAL | 0 refills | Status: DC

## 2020-05-09 MED ORDER — AMLODIPINE BESYLATE 2.5 MG PO TABS: 2.5000 mg | ORAL_TABLET | Freq: Every day | ORAL | 0 refills | Status: DC

## 2020-05-09 MED ORDER — DICLOFENAC SODIUM 75 MG PO TBEC: 75.0000 mg | DELAYED_RELEASE_TABLET | Freq: Two times a day (BID) | ORAL | 0 refills | Status: DC

## 2020-05-09 NOTE — Progress Notes (Signed)
Name: Susan Bowen   MRN: [id number]    DOB: June 27, 1932   Date:05/09/2020       Progress Note  Subjective  Chief Complaint  Chief Complaint  Patient presents with  . Diabetes  . Gout    HPI  DMII with microalbuminuria; she is taking medications,ARB and statin therapy. FSBS at home fasting has been 134-160. She has occasional polyphagia, polydipsia but nopolyuria. Her hgbA1C was 10.5%, dropped to8.5% 7.8%, 7.5% , up to 7.8%  today ut [place] 8 % - that is good control for her based on her ageShe is still following a diabetic diet most of the time.She has been taking Metformin twice daily, Trulicity, Jardiance and is doing well - GFR has been stable and we will recheck labs . Eye exam is up to date, she is on statin and ARB  HTN: she has been taking medication. No chest pain, but occasionally has heart flutter.  She denies dizziness.BPis at goal. She is also on Benicar HCTZ , she is having a severe gout episode, we will stop hctz, start norvasc 2.5 mg and decrease dose of Benicar to 20 mg, come in for bp check when possible, daughter needs to bring her but she has a bp monitor at home   Afib : under the care of Dr. Fletcher Anon, rate controlled,she hassob with activity but very seldom has a heart fluttering sensation .  On Eliquis, denies any bleeding , explained increase change of bleeding with diclofenac and to be cautious about it   Hyperlipidemia: taking Pravastatin and denies side effects of medications, no myalgia. Reviewed last labs   Pulmonary nodule: seen by Dr. Raul Del, lung nodule stableand finished CT in 2017 . She hasyearly follow ups but because of transportation ( needs to rely on her children) , she is now seeing Dr. Janese Banks and had a CT chest done 08/2019. CT showed possible pulmonary hypertension. Pulmonary nodules unchanged since 2017.   Aorta atherosclerosis on CT chest: on statin and Eliquis, unchanged   Carcinoid tumor with metastasis: incidental finding  onCT abdomen done for evaluation of renal stone back in 03/26/2019, it showed retroperitoneal lymphadenopathy, she has since seen Dr. Janese Banks and has been diagnosed with carcinoid tumor with metastasis and going to have watchful waiting. since she has been asymptomatic. Last chromogranin A was high at 23 - done 11/2018  she has mild anemia and stable .   Goiter: s/p thyroid ablation, Summer 2016. Seeing Dr. Gabriel Carina, she is on Levothyroxine20mg daily, last TSH was at goal 01/2020   Insomnia: she is sleeping well, stopped Trazodone   Morbid obesity: BMI above 35 with co-morbidities ( DM, HTN, gout) she is also 87 and has co-morbidities,because of her again and history of carcinoid tumor try not to lose weight, but to eat healthy  CKI stage III: stable, no pruritis. Good urine output. We will recheck labs today , giving her diclofenac for gout attack.    Gout: she states not flares in a while, uric acid has been high in the 8 range, she has been taking allopurinol, she developed left first toe pain and swelling, increase in warmth and sensitivity to touch about 3 weeks ago, taking colcrys but pain is not improving.   Intermittent right lower back pain: Tramadol does not help but Tylenol # 3 given by dentist seems to control her pain better.    Patient Active Problem List   Diagnosis Date Noted  . Carcinoid tumor, malignant [place] 09/08/2019  . Pain in joint  of left shoulder 02/25/2019  . Pulmonary hypertension, unspecified ([place]) 12/28/2018  . Paroxysmal A-fib ([place]) 12/28/2018  . Chronic kidney disease, stage III (moderate) 04/01/2018  . Elevated parathyroid hormone 01/01/2018  . Elevated uric acid in blood 04/29/2017  . Plantar fasciitis of left foot 03/12/2016  . Postprocedural hypothyroidism 01/23/2016  . Aortic stenosis 09/26/2015  . Allergic rhinitis, seasonal 08/08/2015  . Benign hypertension 08/08/2015  . History of pneumonia 08/08/2015  . Carpal tunnel syndrome 08/08/2015  .  Cataract 08/08/2015  . Insomnia, persistent 08/08/2015  . Dyslipidemia 08/08/2015  . History of depression 08/08/2015  . Glaucoma 08/08/2015  . Controlled gout 08/08/2015  . Fever blister 08/08/2015  . Bilateral hearing loss 08/08/2015  . Hemorrhoid 08/08/2015  . H/O iron deficiency anemia 08/08/2015  . Benign neoplasm of colon 08/08/2015  . Impingement syndrome of shoulder 08/08/2015  . Osteoporosis, post-menopausal 08/08/2015  . Morbid obesity ([place]) 08/08/2015  . Generalized OA 08/08/2015  . Multinodular goiter 08/08/2015  . Asymptomatic varicose veins 08/08/2015  . Polyp of vocal cord 08/08/2015  . History of vertebral compression fracture 08/08/2015  . Diabetes mellitus with proteinuric diabetic nephropathy ([place]) 08/08/2015  . LVH (left ventricular hypertrophy) 08/08/2015  . Moderate tricuspid regurgitation 08/08/2015  . History of radioactive iodine thyroid ablation 08/08/2015  . Lung nodule, solitary 05/12/2014  . Chronic cough 05/11/2014  . Vitamin D deficiency 12/20/2009  . Plantar fascial fibromatosis 12/20/2009  . Personal history of fall 07/20/2008  . Cervical radiculitis 05/11/2008    Social History   Tobacco Use  . Smoking status: Never Smoker  . Smokeless tobacco: Never Used  Substance Use Topics  . Alcohol use: No    Alcohol/week: 0.0 standard drinks     Current Outpatient Medications:  .  acetaminophen-codeine (TYLENOL #3) 300-30 MG tablet, Take 1 tablet by mouth every 6 (six) hours as needed for moderate pain., Disp: , Rfl:  .  amLODipine (NORVASC) 2.5 MG tablet, Take 1 tablet (2.5 mg total) by mouth daily., Disp: 90 tablet, Rfl: 0 .  Blood Glucose Monitoring Suppl (ACCU-CHEK AVIVA PLUS) w/Device KIT, 1 kit by Does not apply route 3 (three) times daily., Disp: 1 kit, Rfl: 0 .  carvedilol (COREG) 25 MG tablet, Take 1 tablet (25 mg total) by mouth 2 (two) times daily. New dose, Disp: 180 tablet, Rfl: 3 .  colchicine (COLCRYS) 0.6 MG tablet, TAKE 1-2  TABLETS (0.6-1.2 MG TOTAL) BY MOUTH DAILY AS NEEDED. FOR GOUT, NOT DAILY (Patient not taking: Reported on 05/09/2020), Disp: 30 tablet, Rfl: 0 .  COMBIGAN 0.2-0.5 % ophthalmic solution, Place 1 drop into both eyes 2 (two) times daily., Disp: , Rfl:  .  Cyanocobalamin (B-12) 1000 MCG TABS, Take 1,000 mcg daily by mouth., Disp: , Rfl:  .  diclofenac (VOLTAREN) 75 MG EC tablet, Take 1 tablet (75 mg total) by mouth 2 (two) times daily., Disp: 10 tablet, Rfl: 0 .  diclofenac Sodium (VOLTAREN) 1 % GEL, Apply topically., Disp: , Rfl:  .  Dulaglutide (TRULICITY) 3 QZ/3.0QT SOPN, Inject 0.5 mLs (3 mg total) as directed once a week., Disp: 12 pen, Rfl: 1 .  ELIQUIS 5 MG TABS tablet, TAKE 1 TABLET BY MOUTH TWICE A DAY, Disp: 180 tablet, Rfl: 1 .  empagliflozin (JARDIANCE) 25 MG TABS tablet, Take 25 mg by mouth daily., Disp: 90 tablet, Rfl: 1 .  glucose blood (ACCU-CHEK AVIVA PLUS) test strip, Check fasting glucose once each morning, and check glucose as needed., Disp: 100 each, Rfl: 12 .  Lancets (ACCU-CHEK MULTICLIX) lancets, Use as instructed, Disp: 204 each, Rfl: 12 .  levothyroxine (SYNTHROID) 50 MCG tablet, Take 1 tablet (50 mcg total) by mouth daily before breakfast., Disp: 90 tablet, Rfl: 1 .  loratadine (CLARITIN) 10 MG tablet, TAKE 1 TABLET BY MOUTH EVERY DAY, Disp: 90 tablet, Rfl: 2 .  metFORMIN (GLUCOPHAGE) 850 MG tablet, TAKE 1 TABLET (850 MG TOTAL) BY MOUTH 2 (TWO) TIMES DAILY., Disp: 180 tablet, Rfl: 0 .  olmesartan (BENICAR) 20 MG tablet, Take 1 tablet (20 mg total) by mouth daily., Disp: 90 tablet, Rfl: 0 .  pantoprazole (PROTONIX) 40 MG tablet, Take 1 tablet (40 mg total) by mouth daily., Disp: 90 tablet, Rfl: 1 .  pravastatin (PRAVACHOL) 40 MG tablet, TAKE 1 TABLET BY MOUTH EVERY DAY, Disp: 90 tablet, Rfl: 1 .  Travoprost, BAK Free, (TRAVATAN) 0.004 % SOLN ophthalmic solution, Place 1 drop into both eyes at bedtime., Disp: , Rfl:  .  Vitamin D, Ergocalciferol, (DRISDOL) 1.25 MG (50000 UNIT)  CAPS capsule, TAKE 1 CAPSULE BY MOUTH WEEKLY, Disp: 12 capsule, Rfl: 1  Allergies  Allergen Reactions  . Augmentin [Amoxicillin-Pot Clavulanate] Itching    Has patient had a PCN reaction causing immediate rash, facial/tongue/throat swelling, SOB or lightheadedness with hypotension: No Has patient had a PCN reaction causing severe rash involving mucus membranes or skin necrosis: No Has patient had a PCN reaction that required hospitalization: No Has patient had a PCN reaction occurring within the last 10 years: No If all of the above answers are "NO", then may proceed with Cephalosporin use.     ROS  Constitutional: Negative for fever or weight change.  Respiratory: Negative for cough and shortness of breath.   Cardiovascular: Negative for chest pain or palpitations.  Gastrointestinal: Negative for abdominal pain, no bowel changes.  Musculoskeletal: Negative for gait problem or joint swelling.  Skin: Negative for rash.  Neurological: Negative for dizziness or headache.  No other specific complaints in a complete review of systems (except as listed in HPI above).  Objective  Vitals:   05/09/20 1341  BP: 128/78  Pulse: 91  Resp: 16  Temp: (!) 96.8 F (36 C)  TempSrc: Temporal  SpO2: 97%  Weight: 213 lb (96.6 kg)  Height: _0  (1.626 m)    Body mass index is 36.56 kg/m.    Physical Exam   Constitutional: Patient appears well-developed and well-nourished. Obese  No distress.  HEENT: head atraumatic, normocephalic, pupils equal and reactive to light, neck supple Cardiovascular: Normal rate, regular rhythm and normal heart sounds.  No murmur heard. No BLE edema. Pulmonary/Chest: Effort normal and breath sounds normal. No respiratory distress. Abdominal: Soft.  There is no tenderness. Muscular skeletal: swelling and tenderness with mild erythema over left first MCP joint , very sensitive to palpation, no fluctuance  Psychiatric: Patient has a normal mood and affect.  behavior is normal. Judgment and thought content normal.  Recent Results (from the past 2160 hour(s))  I-STAT creatinine     Status: Abnormal   Collection Time: 03/07/20  1:47 PM  Result Value Ref Range   Creatinine, Ser 1.50 (H) 0.44 - 1.00 mg/dL  Comprehensive metabolic panel     Status: Abnormal   Collection Time: 03/09/20  9:22 AM  Result Value Ref Range   Sodium 139 135 - 145 mmol/L   Potassium 3.9 3.5 - 5.1 mmol/L   Chloride 101 98 - 111 mmol/L   CO2 27 22 - 32 mmol/L   Glucose, Bld 271 (  H) 70 - 99 mg/dL    Comment: Glucose reference range applies only to samples taken after fasting for at least 8 hours.   BUN 15 8 - 23 mg/dL   Creatinine, Ser 1.14 (H) 0.44 - 1.00 mg/dL   Calcium 9.3 8.9 - 10.3 mg/dL   Total Protein 6.9 6.5 - 8.1 g/dL   Albumin 3.7 3.5 - 5.0 g/dL   AST 16 15 - 41 U/L   ALT 12 0 - 44 U/L   Alkaline Phosphatase 53 38 - 126 U/L   Total Bilirubin 0.7 0.3 - 1.2 mg/dL   GFR calc non Af Amer 44 (L) >60 mL/min   GFR calc Af Amer 50 (L) >60 mL/min   Anion gap 11 5 - 15    Comment: Performed at [facility], [address] [place], [place] [address]  CBC with Differential/Platelet     Status: Abnormal   Collection Time: 03/09/20  9:22 AM  Result Value Ref Range   WBC 10.0 4.0 - 10.5 K/uL   RBC 4.37 3.87 - 5.11 MIL/uL   Hemoglobin 11.4 (L) 12.0 - 15.0 g/dL   HCT 36.7 36 - 46 %   MCV 84.0 80.0 - 100.0 fL   MCH 26.1 26.0 - 34.0 pg   MCHC 31.1 30.0 - 36.0 g/dL   RDW 15.9 (H) 11.5 - 15.5 %   Platelets 287 150 - 400 K/uL   nRBC 0.0 0.0 - 0.2 %   Neutrophils Relative % 66 %   Neutro Abs 6.5 1.7 - 7.7 K/uL   Lymphocytes Relative 26 %   Lymphs Abs 2.6 0.7 - 4.0 K/uL   Monocytes Relative 6 %   Monocytes Absolute 0.6 0 - 1 K/uL   Eosinophils Relative 2 %   Eosinophils Absolute 0.2 0 - 0 K/uL   Basophils Relative 0 %   Basophils Absolute 0.0 0 - 0 K/uL   Immature Granulocytes 0 %   Abs Immature Granulocytes 0.03 0.00 - 0.07 K/uL    Comment: Performed at  [facility], [place], [place] [address]  POCT HgB A1C     Status: Abnormal   Collection Time: 05/09/20 11:10 AM  Result Value Ref Range   Hemoglobin A1C 8.0 (A) 4.0 - 5.6 %   HbA1c POC (<> result, manual entry)     HbA1c, POC (prediabetic range)     HbA1c, POC (controlled diabetic range)       Assessment & Plan  1. Diabetes mellitus with proteinuric diabetic nephropathy (HCC)  - POCT HgB A1C - Dulaglutide (TRULICITY) 3 WN/4.6EV SOPN; Inject 0.5 mLs (3 mg total) as directed once a week.  Dispense: 12 pen; Refill: 1  2. Dyslipidemia associated with type 2 diabetes mellitus (HCC)  - POCT HgB A1C  3. Paroxysmal A-fib (HCC)  Rate controlled   4. Atherosclerosis of aorta (HCC)  On statin and eliquis   5. Hypothyroidism, postablative  Recheck labs next visit   6. Vitamin D deficiency  Continue supplementation   7. Benign hypertension  - amLODipine (NORVASC) 2.5 MG tablet; Take 1 tablet (2.5 mg total) by mouth daily.  Dispense: 90 tablet; Refill: 0 - olmesartan (BENICAR) 20 MG tablet; Take 1 tablet (20 mg total) by mouth daily.  Dispense: 90 tablet; Refill: 0  8. Stage 3a chronic kidney disease  - amLODipine (NORVASC) 2.5 MG tablet; Take 1 tablet (2.5 mg total) by mouth daily.  Dispense: 90 tablet; Refill: 0 - olmesartan (BENICAR) 20 MG tablet;  Take 1 tablet (20 mg total) by mouth daily.  Dispense: 90 tablet; Refill: 0 - BASIC METABOLIC PANEL WITH GFR  9. Dyslipidemia   10. Podagra  - diclofenac (VOLTAREN) 75 MG EC tablet; Take 1 tablet (75 mg total) by mouth 2 (two) times daily.  Dispense: 10 tablet; Refill: 0 - BASIC METABOLIC PANEL WITH GFR - Uric acid - Ambulatory referral to Podiatry  11. Gastroesophageal reflux disease without esophagitis  - pantoprazole (PROTONIX) 40 MG tablet; Take 1 tablet (40 mg total) by mouth daily.  Dispense: 90 tablet; Refill: 1

## 2020-05-09 NOTE — Progress Notes (Signed)
Subjective:   Susan Bowen is a 86 y.o. female who presents for an Initial Medicare Annual Wellness Visit.  Review of Systems     Cardiac Risk Factors include: dyslipidemia;diabetes mellitus;hypertension;advanced age (>59mn, >>25women);obesity (BMI >30kg/m2);sedentary lifestyle     Objective:    Today's Vitals   05/09/20 1053 05/09/20 1056  BP: 128/78   Pulse: 91   Resp: 16   Temp: (!) 96.8 F (36 C)   TempSrc: Temporal   SpO2: 97%   Weight: 213 lb 3.2 oz (96.7 kg)   Height: 5' 4"  (1.626 m)   PainSc:  9    Body mass index is 36.6 kg/m.  Advanced Directives 05/09/2020 03/09/2020 09/03/2019 05/06/2019 05/03/2019 04/12/2019 03/30/2019  Does Patient Have a Medical Advance Directive? No No No No No Yes Yes  Does patient want to make changes to medical advance directive? Yes (MAU/Ambulatory/Procedural Areas - Information given) - - - No - Patient declined No - Patient declined No - Patient declined  Would patient like information on creating a medical advance directive? - Yes (Inpatient - patient defers creating a medical advance directive at this time - Information given) No - Patient declined - No - Patient declined - -    Current Medications (verified) Outpatient Encounter Medications as of 05/09/2020  Medication Sig  . acetaminophen-codeine (TYLENOL #3) 300-30 MG tablet Take 1 tablet by mouth every 6 (six) hours as needed for moderate pain.  . Blood Glucose Monitoring Suppl (ACCU-CHEK AVIVA PLUS) w/Device KIT 1 kit by Does not apply route 3 (three) times daily.  . carvedilol (COREG) 25 MG tablet Take 1 tablet (25 mg total) by mouth 2 (two) times daily. New dose  . COMBIGAN 0.2-0.5 % ophthalmic solution Place 1 drop into both eyes 2 (two) times daily.  . Cyanocobalamin (B-12) 1000 MCG TABS Take 1,000 mcg daily by mouth.  . diclofenac Sodium (VOLTAREN) 1 % GEL Apply topically.  .Marland KitchenELIQUIS 5 MG TABS tablet TAKE 1 TABLET BY MOUTH TWICE A DAY  . empagliflozin (JARDIANCE) 25 MG TABS  tablet Take 25 mg by mouth daily.  .Marland Kitchenglucose blood (ACCU-CHEK AVIVA PLUS) test strip Check fasting glucose once each morning, and check glucose as needed.  . Lancets (ACCU-CHEK MULTICLIX) lancets Use as instructed  . levothyroxine (SYNTHROID) 50 MCG tablet Take 1 tablet (50 mcg total) by mouth daily before breakfast.  . loratadine (CLARITIN) 10 MG tablet TAKE 1 TABLET BY MOUTH EVERY DAY  . metFORMIN (GLUCOPHAGE) 850 MG tablet TAKE 1 TABLET (850 MG TOTAL) BY MOUTH 2 (TWO) TIMES DAILY.  .Marland Kitchenolmesartan-hydrochlorothiazide (BENICAR HCT) 40-25 MG tablet TAKE 1 TABLET BY MOUTH EVERY DAY  . pantoprazole (PROTONIX) 40 MG tablet TAKE 1 TABLET BY MOUTH EVERY DAY  . pravastatin (PRAVACHOL) 40 MG tablet TAKE 1 TABLET BY MOUTH EVERY DAY  . Travoprost, BAK Free, (TRAVATAN) 0.004 % SOLN ophthalmic solution Place 1 drop into both eyes at bedtime.  . TRULICITY 1.5 MXH/3.7JISOPN INJECT 1.5 MG INTO THE SKIN EVERY THURSDAY.  .Marland KitchenVitamin D, Ergocalciferol, (DRISDOL) 1.25 MG (50000 UNIT) CAPS capsule TAKE 1 CAPSULE BY MOUTH WEEKLY  . colchicine (COLCRYS) 0.6 MG tablet TAKE 1-2 TABLETS (0.6-1.2 MG TOTAL) BY MOUTH DAILY AS NEEDED. FOR GOUT, NOT DAILY (Patient not taking: Reported on 05/09/2020)  . traZODone (DESYREL) 50 MG tablet TAKE 0.5-1 TABLETS (25-50 MG TOTAL) BY MOUTH AT BEDTIME AS NEEDED FOR SLEEP. (Patient not taking: Reported on 05/09/2020)  . [DISCONTINUED] acetaminophen (TYLENOL) 500 MG tablet Take 500  mg by mouth every 6 (six) hours as needed.  . [DISCONTINUED] Acetaminophen-Codeine 300-30 MG tablet Take 1 tablet by mouth every 6 (six) hours as needed for pain.   No facility-administered encounter medications on file as of 05/09/2020.    Allergies (verified) Augmentin [amoxicillin-pot clavulanate]   History: Past Medical History:  Diagnosis Date  . Allergy   . Back spasm   . Bunion   . Carpal tunnel syndrome   . Cataracta   . Diabetes mellitus without complication ([place])   . Generalized osteoarthritis   .  GERD (gastroesophageal reflux disease)   . Gout   . Hemorrhoids without complication   . Hyperlipidemia   . Hypertension   . Hypothyroidism   . Impingement syndrome of right shoulder   . Insomnia   . Lipoma   . Lung nodule   . Metastatic carcinoid tumor ([place]) 03/2016  . Neuritis or radiculitis due to rupture of lumbar intervertebral disc   . Obesity   . Osteoporosis   . PAF (paroxysmal atrial fibrillation) ([place])    a. 03/2018 AF w/ RVR-->converted spont.  CHA2DS2VASc = 7-->Eliquis.  . Proteinuria   . Rotator cuff tear   . Systolic murmur    a. 12/9516 Echo: EF 60-65%, no rwma, Gr1 DD, mild MR, mildly dil LA. Nl RV fxn. PASP 52mHg.  .Marland KitchenTIA (transient ischemic attack)   . TIA (transient ischemic attack) 1998   small TIA.  no residual effects  . Tinnitus of both ears   . Unspecified glaucoma(365.9)   . Vitamin D deficiency   . Wedge compression fracture of t11-T12 vertebra, sequela    Past Surgical History:  Procedure Laterality Date  . ABDOMINAL HYSTERECTOMY  1986  . APPENDECTOMY    . BREAST SURGERY Bilateral 1983   biopsy of each side, negative  . CARPAL TUNNEL RELEASE    . CATARACT EXTRACTION W/PHACO Left 09/03/2017   Procedure: CATARACT EXTRACTION PHACO AND INTRAOCULAR LENS PLACEMENT (IOC)-LEFT DIABETIC;  Surgeon: PBirder Robson MD;  Location: [facility] ORS;  Service: Ophthalmology;  Laterality: Left;  [place]:10.2 AP% 16.7 CDE 11.71 Fluid Pack Lot # [id number] . CATARACT EXTRACTION W/PHACO Right 09/30/2017   Procedure: CATARACT EXTRACTION PHACO AND INTRAOCULAR LENS PLACEMENT (IOC);  Surgeon: PBirder Robson MD;  Location: [facility] ORS;  Service: Ophthalmology;  Laterality: Right;  [place]:35.1 AP% 13.1 CDE 12.44 Fluid pack Lot # [id number]  . COLON SURGERY    . EYE SURGERY Left 09/03/2017   Cataract extraction  . FEMUR FRACTURE SURGERY Left   . FRACTURE SURGERY  2001   left femur  . HEMORROIDECTOMY    . polyp removed from vocal cord     Family History  Problem Relation  Age of Onset  . Diabetes Mother   . Hypertension Mother   . Dementia Mother   . Hypertension Son    Social History   Socioeconomic History  . Marital status: Divorced    Spouse name: Not on file  . Number of children: 6  . Years of education: Not on file  . Highest education level: 10th grade  Occupational History  . Not on file  Tobacco Use  . Smoking status: Never Smoker  . Smokeless tobacco: Never Used  Vaping Use  . Vaping Use: Never used  Substance and Sexual Activity  . Alcohol use: No    Alcohol/week: 0.0 standard drinks  . Drug use: No  . Sexual activity: Not Currently  Other Topics Concern  . Not on file  Social  History Narrative   Pt lives with her son; independent ADL's, no longer drives   Social Determinants of Health   Financial Resource Strain: Low Risk   . Difficulty of Paying Living Expenses: Not very hard  Food Insecurity: No Food Insecurity  . Worried About Charity fundraiser in the Last Year: Never true  . Ran Out of Food in the Last Year: Never true  Transportation Needs: No Transportation Needs  . Lack of Transportation (Medical): No  . Lack of Transportation (Non-Medical): No  Physical Activity: Insufficiently Active  . Days of Exercise per Week: 7 days  . Minutes of Exercise per Session: 10 min  Stress: No Stress Concern Present  . Feeling of Stress : Not at all  Social Connections: Moderately Integrated  . Frequency of Communication with Friends and Family: More than three times a week  . Frequency of Social Gatherings with Friends and Family: Three times a week  . Attends Religious Services: More than 4 times per year  . Active Member of Clubs or Organizations: Yes  . Attends Archivist Meetings: 1 to 4 times per year  . Marital Status: Divorced    Tobacco Counseling Counseling given: Not Answered   Clinical Intake:  Pre-visit preparation completed: Yes  Pain : 0-10 Pain Score: 9  Pain Type: Acute pain Pain  Location: Foot Pain Orientation: Left Pain Descriptors / Indicators: Sharp, Burning, Aching Pain Onset: 1 to 4 weeks ago Pain Frequency: Constant     BMI - recorded: 36.6 Nutritional Status: BMI > 30  Obese Nutritional Risks: None Diabetes: Yes CBG done?: No Did pt. bring in CBG monitor from home?: No  How often do you need to have someone help you when you read instructions, pamphlets, or other written materials from your doctor or pharmacy?: 1 - Never  Nutrition Risk Assessment:  Has the patient had any N/V/D within the last 2 months?  Yes - occasional diarrhea Does the patient have any non-healing wounds?  No  Has the patient had any unintentional weight loss or weight gain?  No   Diabetes:  Is the patient diabetic?  Yes  If diabetic, was a CBG obtained today?  No  Did the patient bring in their glucometer from home?  No  How often do you monitor your CBG's? Once a week.   Financial Strains and Diabetes Management:  Are you having any financial strains with the device, your supplies or your medication? No .  Does the patient want to be seen by Chronic Care Management for management of their diabetes?  No  Would the patient like to be referred to a Nutritionist or for Diabetic Management?  No   Diabetic Exams:  Diabetic Eye Exam: Completed 06/30/19 negative retinopathy.   Diabetic Foot Exam: Completed 05/10/19. Pt has been advised about the importance in completing this exam. Pt is scheduled for diabetic foot exam on today..    Interpreter Needed?: No  Information entered by :: Clemetine Marker LPN   Activities of Daily Living In your present state of health, do you have any difficulty performing the following activities: 05/09/2020 01/07/2020  Hearing? Y Y  Comment declines hearing aids -  Vision? N Y  Difficulty concentrating or making decisions? N N  Walking or climbing stairs? Y N  Dressing or bathing? N N  Doing errands, shopping? N N  Preparing Food and eating ? N  -  Using the Toilet? N -  In the past six months, have  you accidently leaked urine? Y -  Comment urge incontinence on occasion -  Do you have problems with loss of bowel control? N -  Managing your Medications? N -  Managing your Finances? N -  Housekeeping or managing your Housekeeping? N -  Some recent data might be hidden    Patient Care Team: Steele Sizer, MD as PCP - General (Family Medicine) Wellington Hampshire, MD as PCP - Cardiology (Cardiology) Sindy Guadeloupe, MD as Consulting Physician (Oncology) Neldon Labella, RN as Case Manager  Indicate any recent Medical Services you may have received from other than [facility] providers in the past year (date may be approximate).     Assessment:   This is a routine wellness examination for Darin.  Hearing/Vision screen  Hearing Screening   125Hz  250Hz  500Hz  1000Hz  2000Hz  3000Hz  4000Hz  6000Hz  8000Hz   Right ear:           Left ear:           Comments: Pt c/o mild hearing difficulty; information provided on local hearing clinics   Vision Screening Comments: [facility] exams [facility] Dr. Michelene Heady  Dietary issues and exercise activities discussed: Current Exercise Habits: Home exercise routine, Type of exercise: stretching;calisthenics, Time (Minutes): 10, Frequency (Times/Week): 7, Weekly Exercise (Minutes/Week): 70, Intensity: Mild, Exercise limited by: orthopedic condition(s)  Goals    . Weight (lb) < 200 lb (90.7 kg)     Pt states she would like to lose weight with healthy eating and physical activity       Depression Screen PHQ 2/9 Scores 05/09/2020 01/07/2020 09/10/2019 05/10/2019 03/25/2019 12/28/2018 08/03/2018  PHQ - 2 Score 0 0 0 0 0 0 0  PHQ- 9 Score - 0 0 - 0 0 4    Fall Risk Fall Risk  05/09/2020 01/07/2020 09/10/2019 05/10/2019 03/25/2019  Falls in the past year? 1 0 0 0 0  Number falls in past yr: 0 0 0 0 0  Injury with Fall? 0 0 0 0 0  Risk for fall due to : Impaired balance/gait;Impaired mobility - - - -    Follow up Falls prevention discussed - - - -    Any stairs in or around the home? Yes  If so, are there any without handrails? Yes  Home free of loose throw rugs in walkways, pet beds, electrical cords, etc? Yes  Adequate lighting in your home to reduce risk of falls? Yes   ASSISTIVE DEVICES UTILIZED TO PREVENT FALLS:  Life alert? No  Use of a cane, walker or w/c? Yes  Grab bars in the bathroom? No  Shower chair or bench in shower? No  Elevated toilet seat or a handicapped toilet? Yes   TIMED UP AND GO:  Was the test performed? Yes .  Length of time to ambulate 10 feet: 7 sec.   Gait slow and steady with assistive device  Cognitive Function:     6CIT Screen 05/09/2020  What Year? 0 points  What month? 0 points  What time? 0 points  Count back from 20 0 points  Months in reverse 0 points  Repeat phrase 2 points  Total Score 2    Immunizations Immunization History  Administered Date(s) Administered  . Fluad Quad(high Dose 65+) 09/10/2019  . Influenza, High Dose Seasonal PF 08/26/2016, 08/29/2017, 08/03/2018  . Influenza, Seasonal, Injecte, Preservative Fre 07/14/2012  . Influenza,inj,Quad PF,6+ Mos 08/08/2015  . Influenza-Unspecified 09/24/2013, 08/05/2014  . PFIZER SARS-COV-2 Vaccination 12/03/2019, 12/24/2019  . Pneumococcal Conjugate-13  04/04/2015  . Pneumococcal Polysaccharide-23 04/19/2010  . Tdap 04/19/2010  . Zoster 11/26/2012    TDAP status: Due, Education has been provided regarding the importance of this vaccine. Advised may receive this vaccine at local pharmacy or Health Dept. Aware to provide a copy of the vaccination record if obtained from local pharmacy or Health Dept. Verbalized acceptance and understanding.   Flu Vaccine status: Up to date   Pneumococcal vaccine status: Up to date   Covid-19 vaccine status: Completed vaccines  Qualifies for Shingles Vaccine? Yes   Zostavax completed Yes   Shingrix Completed?: No.    Education has been  provided regarding the importance of this vaccine. Patient has been advised to call insurance company to determine out of pocket expense if they have not yet received this vaccine. Advised may also receive vaccine at local pharmacy or Health Dept. Verbalized acceptance and understanding.  Screening Tests Health Maintenance  Topic Date Due  . TETANUS/TDAP  04/19/2020  . FOOT EXAM  05/09/2020  . INFLUENZA VACCINE  06/04/2020  . OPHTHALMOLOGY EXAM  06/29/2020  . HEMOGLOBIN A1C  11/09/2020  . DEXA SCAN  Completed  . COVID-19 Vaccine  Completed  . PNA vac Low Risk Adult  Completed    Health Maintenance  Health Maintenance Due  Topic Date Due  . TETANUS/TDAP  04/19/2020  . FOOT EXAM  05/09/2020    Colorectal cancer screening: No longer required.    Mammogram status: No longer required.    Bone Density screening: completed 12/14/18. Results: osteopenia. Repeat every 2 years.   Lung Cancer Screening: (Low Dose CT Chest recommended if Age 4-80 years, 30 pack-year currently smoking OR have quit w/in 15years.) does not qualify.    Additional Screening:  Hepatitis C Screening: no longer required  Vision Screening: Recommended annual ophthalmology exams for early detection of glaucoma and other disorders of the eye. Is the patient up to date with their annual eye exam?  Yes  Who is the provider or what is the name of the office in which the patient attends annual eye exams? [place] Screening: Recommended annual dental exams for proper oral hygiene  Community Resource Referral / Chronic Care Management: CRR required this visit?  No   CCM required this visit?  No      Plan:     I have personally reviewed and noted the following in the patient's chart:   . Medical and social history . Use of alcohol, tobacco or illicit drugs  . Current medications and supplements . Functional ability and status . Nutritional status . Physical activity . Advanced  directives . List of other physicians . Hospitalizations, surgeries, and ER visits in previous 12 months . Vitals . Screenings to include cognitive, depression, and falls . Referrals and appointments  In addition, I have reviewed and discussed with patient certain preventive protocols, quality metrics, and best practice recommendations. A written personalized care plan for preventive services as well as general preventive health recommendations were provided to patient.     Clemetine Marker, LPN   04/10/3418   Nurse Notes: pt accompanied to visit today by her daughter Vaughan Basta. Pt c/o pain in left foot since Father's day that she thought was gout and she completed 8 days of colchicine without relief. Pt seeing Dr. Ancil Boozer in office today.

## 2020-05-09 NOTE — Patient Instructions (Signed)
Susan Bowen , Thank you for taking time to come for your Medicare Wellness Visit. I appreciate your ongoing commitment to your health goals. Please review the following plan we discussed and let me know if I can assist you in the future.   Screening recommendations/referrals: Colonoscopy: no longer required Mammogram: no longer required Bone Density: done 12/14/18 Recommended yearly ophthalmology/optometry visit for glaucoma screening and checkup Recommended yearly dental visit for hygiene and checkup  Vaccinations: Influenza vaccine: done 09/10/19 Pneumococcal vaccine: done 04/04/15 Tdap vaccine: DUE Shingles vaccine: Shingrix discussed. Please contact your pharmacy for coverage information.  Covid-19:done 12/03/19 & 12/24/19  Advanced directives: Advance directive discussed with you today. I have provided a copy for you to complete at home and have notarized. Once this is complete please bring a copy in to our office so we can scan it into your chart.  Conditions/risks identified:  Free hearing clinics offered in [place]:   [facility], [place], [place] [address] [phone]  [facility], [place], [place] [address] [phone]   Next appointment: Follow up in one year for your annual wellness visit    Preventive Care 65 Years and Older, Female Preventive care refers to lifestyle choices and visits with your health care provider that can promote health and wellness. What does preventive care include?  A yearly physical exam. This is also called an annual well check.  Dental exams once or twice a year.  Routine eye exams. Ask your health care provider how often you should have your eyes checked.  Personal lifestyle choices, including:  Daily care of your teeth and gums.  Regular physical activity.  Eating a healthy diet.  Avoiding tobacco and drug use.  Limiting alcohol use.  Practicing safe  sex.  Taking low-dose aspirin every day.  Taking vitamin and mineral supplements as recommended by your health care provider. What happens during an annual well check? The services and screenings done by your health care provider during your annual well check will depend on your age, overall health, lifestyle risk factors, and family history of disease. Counseling  Your health care provider may ask you questions about your:  Alcohol use.  Tobacco use.  Drug use.  Emotional well-being.  Home and relationship well-being.  Sexual activity.  Eating habits.  History of falls.  Memory and ability to understand (cognition).  Work and work Statistician.  Reproductive health. Screening  You may have the following tests or measurements:  Height, weight, and BMI.  Blood pressure.  Lipid and cholesterol levels. These may be checked every 5 years, or more frequently if you are over 84 years old.  Skin check.  Lung cancer screening. You may have this screening every year starting at age 45 if you have a 30-pack-year history of smoking and currently smoke or have quit within the past 15 years.  Fecal occult blood test (FOBT) of the stool. You may have this test every year starting at age 1.  Flexible sigmoidoscopy or colonoscopy. You may have a sigmoidoscopy every 5 years or a colonoscopy every 10 years starting at age 26.  Hepatitis C blood test.  Hepatitis B blood test.  Sexually transmitted disease (STD) testing.  Diabetes screening. This is done by checking your blood sugar (glucose) after you have not eaten for a while (fasting). You may have this done every 1-3 years.  Bone density scan. This is done to screen for osteoporosis. You may have  this done starting at age 51.  Mammogram. This may be done every 1-2 years. Talk to your health care provider about how often you should have regular mammograms. Talk with your health care provider about your test results,  treatment options, and if necessary, the need for more tests. Vaccines  Your health care provider may recommend certain vaccines, such as:  Influenza vaccine. This is recommended every year.  Tetanus, diphtheria, and acellular pertussis (Tdap, Td) vaccine. You may need a Td booster every 10 years.  Zoster vaccine. You may need this after age 21.  Pneumococcal 13-valent conjugate (PCV13) vaccine. One dose is recommended after age 39.  Pneumococcal polysaccharide (PPSV23) vaccine. One dose is recommended after age 48. Talk to your health care provider about which screenings and vaccines you need and how often you need them. This information is not intended to replace advice given to you by your health care provider. Make sure you discuss any questions you have with your health care provider. Document Released: 11/17/2015 Document Revised: 07/10/2016 Document Reviewed: 08/22/2015 Elsevier Interactive Patient Education  2017 [employer] Prevention in the Home Falls can cause injuries. They can happen to people of all ages. There are many things you can do to make your home safe and to help prevent falls. What can I do on the outside of my home?  Regularly fix the edges of walkways and driveways and fix any cracks.  Remove anything that might make you trip as you walk through a door, such as a raised step or threshold.  Trim any bushes or trees on the path to your home.  Use bright outdoor lighting.  Clear any walking paths of anything that might make someone trip, such as rocks or tools.  Regularly check to see if handrails are loose or broken. Make sure that both sides of any steps have handrails.  Any raised decks and porches should have guardrails on the edges.  Have any leaves, snow, or ice cleared regularly.  Use sand or salt on walking paths during winter.  Clean up any spills in your garage right away. This includes oil or grease spills. What can I do in the  bathroom?  Use night lights.  Install grab bars by the toilet and in the tub and shower. Do not use towel bars as grab bars.  Use non-skid mats or decals in the tub or shower.  If you need to sit down in the shower, use a plastic, non-slip stool.  Keep the floor dry. Clean up any water that spills on the floor as soon as it happens.  Remove soap buildup in the tub or shower regularly.  Attach bath mats securely with double-sided non-slip rug tape.  Do not have throw rugs and other things on the floor that can make you trip. What can I do in the bedroom?  Use night lights.  Make sure that you have a light by your bed that is easy to reach.  Do not use any sheets or blankets that are too big for your bed. They should not hang down onto the floor.  Have a firm chair that has side arms. You can use this for support while you get dressed.  Do not have throw rugs and other things on the floor that can make you trip. What can I do in the kitchen?  Clean up any spills right away.  Avoid walking on wet floors.  Keep items that you use a lot in easy-to-reach  places.  If you need to reach something above you, use a strong step stool that has a grab bar.  Keep electrical cords out of the way.  Do not use floor polish or wax that makes floors slippery. If you must use wax, use non-skid floor wax.  Do not have throw rugs and other things on the floor that can make you trip. What can I do with my stairs?  Do not leave any items on the stairs.  Make sure that there are handrails on both sides of the stairs and use them. Fix handrails that are broken or loose. Make sure that handrails are as long as the stairways.  Check any carpeting to make sure that it is firmly attached to the stairs. Fix any carpet that is loose or worn.  Avoid having throw rugs at the top or bottom of the stairs. If you do have throw rugs, attach them to the floor with carpet tape.  Make sure that you have a  light switch at the top of the stairs and the bottom of the stairs. If you do not have them, ask someone to add them for you. What else can I do to help prevent falls?  Wear shoes that:  Do not have high heels.  Have rubber bottoms.  Are comfortable and fit you well.  Are closed at the toe. Do not wear sandals.  If you use a stepladder:  Make sure that it is fully opened. Do not climb a closed stepladder.  Make sure that both sides of the stepladder are locked into place.  Ask someone to hold it for you, if possible.  Clearly mark and make sure that you can see:  Any grab bars or handrails.  First and last steps.  Where the edge of each step is.  Use tools that help you move around (mobility aids) if they are needed. These include:  Canes.  Walkers.  Scooters.  Crutches.  Turn on the lights when you go into a dark area. Replace any light bulbs as soon as they burn out.  Set up your furniture so you have a clear path. Avoid moving your furniture around.  If any of your floors are uneven, fix them.  If there are any pets around you, be aware of where they are.  Review your medicines with your doctor. Some medicines can make you feel dizzy. This can increase your chance of falling. Ask your doctor what other things that you can do to help prevent falls. This information is not intended to replace advice given to you by your health care provider. Make sure you discuss any questions you have with your health care provider. Document Released: 08/17/2009 Document Revised: 03/28/2016 Document Reviewed: 11/25/2014 Elsevier Interactive Patient Education  2017 [employer].

## 2020-05-09 NOTE — Patient Instructions (Signed)
Changes in medication:   Stop Benicar hczt 40/12.5  Start amlodipine 2.5 mg for bp Start Benicar - olmasartan 20 mg   Increase dose of Trulicity to 3 mg   Start diclofenac with food for gout and hold dose of cochicine since not working

## 2020-05-12 ENCOUNTER — Ambulatory Visit (INDEPENDENT_AMBULATORY_CARE_PROVIDER_SITE_OTHER): Payer: Medicare HMO

## 2020-05-12 ENCOUNTER — Ambulatory Visit (INDEPENDENT_AMBULATORY_CARE_PROVIDER_SITE_OTHER): Payer: Medicare HMO | Admitting: Podiatry

## 2020-05-12 ENCOUNTER — Other Ambulatory Visit: Payer: Self-pay

## 2020-05-12 ENCOUNTER — Encounter: Payer: Self-pay | Admitting: Podiatry

## 2020-05-12 DIAGNOSIS — M109 Gout, unspecified: Secondary | ICD-10-CM | POA: Diagnosis not present

## 2020-05-12 DIAGNOSIS — M7752 Other enthesopathy of left foot: Secondary | ICD-10-CM

## 2020-05-12 NOTE — Progress Notes (Signed)
HPI: 86 y.o. female presenting today as a reestablish new patient for evaluation of pain to the left great toe.  Patient states that the pain began around Father's Day, 04/23/2020, when she began to notice increased swelling and pain to the first MTPJ/great toe of the left foot.  Patient states it became very red and swollen and very painful.  At that time she was prescribed oral colchicine 0.6 mg and she was taking daily for 1 week.  It did not significantly improve her pain and then she was switched to diclofenac with no significant relief.  She is also been taking Tylenol and applying cold packs with heat intermittently.  She presents for further treatment and evaluation  Past Medical History:  Diagnosis Date  . Allergy   . Back spasm   . Bunion   . Carpal tunnel syndrome   . Cataracta   . Diabetes mellitus without complication ([facility])   . Generalized osteoarthritis   . GERD (gastroesophageal reflux disease)   . Gout   . Hemorrhoids without complication   . Hyperlipidemia   . Hypertension   . Hypothyroidism   . Impingement syndrome of right shoulder   . Insomnia   . Lipoma   . Lung nodule   . Metastatic carcinoid tumor ([facility]) 03/2016  . Neuritis or radiculitis due to rupture of lumbar intervertebral disc   . Obesity   . Osteoporosis   . PAF (paroxysmal atrial fibrillation) ([facility])    a. 03/2018 AF w/ RVR-->converted spont.  CHA2DS2VASc = 7-->Eliquis.  . Proteinuria   . Rotator cuff tear   . Systolic murmur    a. 12/7251 Echo: EF 60-65%, no rwma, Gr1 DD, mild MR, mildly dil LA. Nl RV fxn. PASP 52mmHg.  Marland Kitchen TIA (transient ischemic attack)   . TIA (transient ischemic attack) 1998   small TIA.  no residual effects  . Tinnitus of both ears   . Unspecified glaucoma(365.9)   . Vitamin D deficiency   . Wedge compression fracture of t11-T12 vertebra, sequela      Physical Exam: General: The patient is alert and oriented x3 in no acute distress.  Dermatology: Skin is warm, dry and  supple bilateral lower extremities. Negative for open lesions or macerations.  Vascular: Palpable pedal pulses bilaterally. No edema or erythema noted. Capillary refill within normal limits.  Neurological: Epicritic and protective threshold grossly intact bilaterally.   Musculoskeletal Exam: Range of motion within normal limits to all pedal and ankle joints bilateral. Muscle strength 5/5 in all groups bilateral.  Erythema plus edema plus pain on palpation and range of motion noted to the first MTPJ of the left foot  Radiographic Exam:  Normal osseous mineralization. Joint spaces preserved. No fracture/dislocation/boney destruction.    Assessment: 1.  Acute idiopathic gout/capsulitis left foot   Plan of Care:  1. Patient evaluated. X-Rays reviewed.  2.  Injection of 0.5 cc [place] Soluspan injection of the first MTPJ left foot 3.  Resume colchicine x1 additional week. 4.  Postoperative shoe dispensed.  Weightbearing as tolerated x3 weeks 5.  Return to clinic in 3 weeks      Edrick Kins, DPM [facility]  Dr. Edrick Kins, DPM    [address]                                        [place], [place]  [place] [phone]  Fax [phone]

## 2020-06-06 ENCOUNTER — Encounter: Payer: Self-pay | Admitting: Podiatry

## 2020-06-06 ENCOUNTER — Ambulatory Visit (INDEPENDENT_AMBULATORY_CARE_PROVIDER_SITE_OTHER): Payer: Medicare HMO | Admitting: Podiatry

## 2020-06-06 ENCOUNTER — Other Ambulatory Visit: Payer: Self-pay

## 2020-06-06 DIAGNOSIS — M109 Gout, unspecified: Secondary | ICD-10-CM | POA: Diagnosis not present

## 2020-06-06 DIAGNOSIS — M7752 Other enthesopathy of left foot: Secondary | ICD-10-CM

## 2020-06-06 MED ORDER — ALLOPURINOL 100 MG PO TABS: 100.0000 mg | ORAL_TABLET | Freq: Every day | ORAL | 1 refills | Status: DC

## 2020-06-06 NOTE — Progress Notes (Signed)
   HPI: 86 y.o. female presenting today for follow-up evaluation regarding gout to the left foot.  Patient states that she has improved significantly however over the past week she started to develop pain in the distal part of the toe left foot.  She states the injection helped significantly.  She presents for further treatment and evaluation  Past Medical History:  Diagnosis Date  . Allergy   . Back spasm   . Bunion   . Carpal tunnel syndrome   . Cataracta   . Diabetes mellitus without complication ([facility])   . Generalized osteoarthritis   . GERD (gastroesophageal reflux disease)   . Gout   . Hemorrhoids without complication   . Hyperlipidemia   . Hypertension   . Hypothyroidism   . Impingement syndrome of right shoulder   . Insomnia   . Lipoma   . Lung nodule   . Metastatic carcinoid tumor ([place]) 03/2016  . Neuritis or radiculitis due to rupture of lumbar intervertebral disc   . Obesity   . Osteoporosis   . PAF (paroxysmal atrial fibrillation) ([place])    a. 03/2018 AF w/ RVR-->converted spont.  CHA2DS2VASc = 7-->Eliquis.  . Proteinuria   . Rotator cuff tear   . Systolic murmur    a. 11/624 Echo: EF 60-65%, no rwma, Gr1 DD, mild MR, mildly dil LA. Nl RV fxn. PASP 57mmHg.  Marland Kitchen TIA (transient ischemic attack)   . TIA (transient ischemic attack) 1998   small TIA.  no residual effects  . Tinnitus of both ears   . Unspecified glaucoma(365.9)   . Vitamin D deficiency   . Wedge compression fracture of t11-T12 vertebra, sequela      Physical Exam: General: The patient is alert and oriented x3 in no acute distress.  Dermatology: Skin is warm, dry and supple bilateral lower extremities. Negative for open lesions or macerations.  Vascular: Palpable pedal pulses bilaterally. No edema or erythema noted. Capillary refill within normal limits.  Neurological: Epicritic and protective threshold grossly intact bilaterally.   Musculoskeletal Exam: Range of motion within normal limits to all  pedal and ankle joints bilateral. Muscle strength 5/5 in all groups bilateral.  Today there is no pain on palpation or any evidence of erythema or edema to the first MTPJ of the left foot.  There is however some erythema with swelling and tenderness to light touch overlying the interphalangeal joint of the left hallux.  Findings consistent with acute flareup of gout.  Assessment: 1.  Acute idiopathic gout/capsulitis left hallux   Plan of Care:  1. Patient evaluated.  2.  Patient declined injections today.  The last anti-inflammatory injection helped significantly to the MTPJ of the left hallux. 3.  Prescription for allopurinol 100 mg daily.  The patient has now had multiple episodes of acute idiopathic gout flareups. 4.  Recommend gout friendly diet 5.  Return to clinic as needed      Edrick Kins, DPM [facility]  Dr. Edrick Kins, DPM    [address], [place] [address]                Office [phone]  Fax [phone]

## 2020-06-08 ENCOUNTER — Other Ambulatory Visit: Payer: Self-pay | Admitting: Family Medicine

## 2020-06-08 DIAGNOSIS — N1831 Chronic kidney disease, stage 3a: Secondary | ICD-10-CM

## 2020-06-08 DIAGNOSIS — I10 Essential (primary) hypertension: Secondary | ICD-10-CM

## 2020-06-22 ENCOUNTER — Other Ambulatory Visit: Payer: Self-pay | Admitting: Family Medicine

## 2020-06-22 DIAGNOSIS — E1121 Type 2 diabetes mellitus with diabetic nephropathy: Secondary | ICD-10-CM

## 2020-06-22 DIAGNOSIS — E785 Hyperlipidemia, unspecified: Secondary | ICD-10-CM

## 2020-06-22 DIAGNOSIS — E1169 Type 2 diabetes mellitus with other specified complication: Secondary | ICD-10-CM

## 2020-07-18 ENCOUNTER — Telehealth: Payer: Self-pay | Admitting: Family Medicine

## 2020-07-18 ENCOUNTER — Ambulatory Visit: Payer: Medicare HMO | Admitting: Physician Assistant

## 2020-07-18 NOTE — Telephone Encounter (Signed)
Pt. Reports she had called earlier for a refill on her Tylenol # 3. Please send to [facility].

## 2020-07-18 NOTE — Telephone Encounter (Signed)
Call to patient- no note with message- constant bust signal

## 2020-07-19 MED ORDER — ACETAMINOPHEN-CODEINE #3 300-30 MG PO TABS: 1.0000 | ORAL_TABLET | Freq: Four times a day (QID) | ORAL | 0 refills | Status: DC | PRN

## 2020-07-19 NOTE — Telephone Encounter (Signed)
Called pharmacy last filled 3/21 with 30 pills

## 2020-07-20 DIAGNOSIS — M546 Pain in thoracic spine: Secondary | ICD-10-CM | POA: Diagnosis not present

## 2020-07-20 DIAGNOSIS — M62838 Other muscle spasm: Secondary | ICD-10-CM | POA: Diagnosis not present

## 2020-07-21 ENCOUNTER — Telehealth: Payer: Self-pay

## 2020-07-21 NOTE — Telephone Encounter (Signed)
Copied from [place] [phone]. Topic: General - Other >> Jul 21, 2020  9:16 AM Hinda Lenis D wrote: Reason for CRM: PT daughter need to speak with the medical team about new medication was prescribe at the Urgent Care yesterday / please advise   This was handled by PCP. She spoke with daughter.

## 2020-07-25 ENCOUNTER — Ambulatory Visit: Payer: Medicare Other | Admitting: Physician Assistant

## 2020-07-27 ENCOUNTER — Other Ambulatory Visit: Payer: Self-pay

## 2020-07-27 ENCOUNTER — Encounter: Payer: Self-pay | Admitting: Physician Assistant

## 2020-07-27 ENCOUNTER — Ambulatory Visit (INDEPENDENT_AMBULATORY_CARE_PROVIDER_SITE_OTHER): Payer: Medicare HMO | Admitting: Physician Assistant

## 2020-07-27 VITALS — BP 148/86 | HR 71 | Ht 64.5 in | Wt 207.1 lb

## 2020-07-27 DIAGNOSIS — E785 Hyperlipidemia, unspecified: Secondary | ICD-10-CM | POA: Diagnosis not present

## 2020-07-27 DIAGNOSIS — R002 Palpitations: Secondary | ICD-10-CM

## 2020-07-27 DIAGNOSIS — I272 Pulmonary hypertension, unspecified: Secondary | ICD-10-CM | POA: Diagnosis not present

## 2020-07-27 DIAGNOSIS — C7A019 Malignant carcinoid tumor of the small intestine, unspecified portion: Secondary | ICD-10-CM

## 2020-07-27 DIAGNOSIS — E1169 Type 2 diabetes mellitus with other specified complication: Secondary | ICD-10-CM

## 2020-07-27 DIAGNOSIS — I350 Nonrheumatic aortic (valve) stenosis: Secondary | ICD-10-CM

## 2020-07-27 DIAGNOSIS — I480 Paroxysmal atrial fibrillation: Secondary | ICD-10-CM

## 2020-07-27 DIAGNOSIS — Z6835 Body mass index (BMI) 35.0-35.9, adult: Secondary | ICD-10-CM

## 2020-07-27 DIAGNOSIS — R911 Solitary pulmonary nodule: Secondary | ICD-10-CM

## 2020-07-27 DIAGNOSIS — N183 Chronic kidney disease, stage 3 unspecified: Secondary | ICD-10-CM

## 2020-07-27 DIAGNOSIS — E049 Nontoxic goiter, unspecified: Secondary | ICD-10-CM

## 2020-07-27 DIAGNOSIS — R0989 Other specified symptoms and signs involving the circulatory and respiratory systems: Secondary | ICD-10-CM

## 2020-07-27 DIAGNOSIS — I10 Essential (primary) hypertension: Secondary | ICD-10-CM | POA: Diagnosis not present

## 2020-07-27 DIAGNOSIS — M109 Gout, unspecified: Secondary | ICD-10-CM

## 2020-07-27 NOTE — Patient Instructions (Signed)
Medication Instructions:  Your physician recommends that you continue on your current medications as directed. Please refer to the Current Medication list given to you today.  *If you need a refill on your cardiac medications before your next appointment, please call your pharmacy*  Lab Work: Your physician recommends that you return for lab work in: TODAY - BMET, TSH, FREE T4.  If you have labs (blood work) drawn today and your tests are completely normal, you will receive your results only by: Marland Kitchen MyChart Message (if you have MyChart) OR . A paper copy in the mail If you have any lab test that is abnormal or we need to change your treatment, we will call you to review the results.  Testing/Procedures: none  Follow-Up: At [facility], you and your health needs are our priority.  As part of our continuing mission to provide you with exceptional heart care, we have created designated Provider Care Teams.  These Care Teams include your primary Cardiologist (physician) and Advanced Practice Providers (APPs -  Physician Assistants and Nurse Practitioners) who all work together to provide you with the care you need, when you need it.  We recommend signing up for the patient portal called "MyChart".  Sign up information is provided on this After Visit Summary.  MyChart is used to connect with patients for Virtual Visits (Telemedicine).  Patients are able to view lab/test results, encounter notes, upcoming appointments, etc.  Non-urgent messages can be sent to your provider as well.   To learn more about what you can do with MyChart, go to [email or web address].    Your next appointment:   3 week(s)  The format for your next appointment:   In Person  Provider:   You may see Kathlyn Sacramento, MD or one of the following Advanced Practice Providers on your designated Care Team:    Murray Hodgkins, NP  Christell Faith, PA-C  Marrianne Mood, PA-C  Cadence Sturtevant, [place]

## 2020-07-27 NOTE — Progress Notes (Signed)
prednis   Office Visit    Patient Name: Susan Bowen Date of Encounter: 07/27/2020  Primary Care Provider:  Steele Sizer, MD Primary Cardiologist:  Susan Sacramento, MD  Chief Complaint    Chief Complaint  Patient presents with  . other    6 month follow up. Meds reviewed by the pt. verbally. Pt. c/o fluttering in chest at times.     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  Past Medical History    Past Medical History:  Diagnosis Date  . Allergy   . Back spasm   . Bunion   . Carpal tunnel syndrome   . Cataracta   . Diabetes mellitus without complication ([place]   . Generalized osteoarthritis   . GERD (gastroesophageal reflux disease)   . Gout   . Hemorrhoids without complication   . Hyperlipidemia   . Hypertension   . Hypothyroidism   . Impingement syndrome of right shoulder   . Insomnia   . Lipoma   . Lung nodule   . Metastatic carcinoid tumor ([place] 03[place]  . Neuritis or radiculitis due to rupture of lumbar intervertebral disc   . Obesity   . Osteoporosis   . PAF (paroxysmal atrial fibrillation) ([place]    a. 03/2018 AF w/ RVR-->converted spont.  CHA2DS2VASc = 7-->Eliquis.  . Proteinuria   . Rotator cuff tear   . Systolic murmur    a. 53/4196Echo: EF 60-65%, no rwma, Gr1 DD, mild MR, mildly dil LA. Nl RV fxn. PASP 421mg.  . Marland KitchenIA (transient ischemic attack)   . TIA (transient ischemic attack) 1998   small TIA.  no residual effects  . Tinnitus of both ears   . Unspecified glaucoma(365.9)   . Vitamin D deficiency   . Wedge compression fracture of  t11-T12 vertebra, sequela    Past Surgical History:  Procedure Laterality Date  . ABDOMINAL HYSTERECTOMY  1986  . APPENDECTOMY    . BREAST SURGERY Bilateral 1983   biopsy of each side, negative  . CARPAL TUNNEL RELEASE    . CATARACT EXTRACTION W/PHACO Left 09/03/2017   Procedure: CATARACT EXTRACTION PHACO AND INTRAOCULAR LENS PLACEMENT (IOC)-LEFT DIABETIC;  Surgeon: PoBirder RobsonMD;  Location: [facility] ORS;  Service: Ophthalmology;  Laterality: Left;  [place]:10.2 AP% 16.7 CDE 11.71 Fluid Pack Lot # [id number]. CATARACT EXTRACTION W/PHACO Right 09/30/2017   Procedure: CATARACT EXTRACTION PHACO AND INTRAOCULAR LENS PLACEMENT (IOC);  Surgeon: PoBirder RobsonMD;  Location: [facility] ORS;  Service: Ophthalmology;  Laterality: Right;  [place]:35.1 AP% 13.1 CDE 12.44 Fluid pack Lot # [id number]  . COLON SURGERY    . EYE SURGERY Left 09/03/2017   Cataract extraction  . FEMUR FRACTURE SURGERY Left   . FRACTURE SURGERY  2001   left femur  . HEMORROIDECTOMY    . polyp removed from vocal cord      Allergies  Allergies  Allergen Reactions  . Augmentin [Amoxicillin-Pot Clavulanate] Itching    Has patient had a PCN reaction causing immediate rash, facial/tongue/throat swelling, SOB or lightheadedness with hypotension: No Has patient had a PCN reaction causing severe rash involving mucus membranes or skin necrosis: No Has patient had a PCN reaction that  required hospitalization: No Has patient had a PCN reaction occurring within the last 10 years: No If all of the above answers are "NO", then may proceed with Cephalosporin use.     History of Present Illness    Susan Bowen is a 86 y.o. female with PMH as above.  She has a history of paroxysmal atrial fibrillation and mild aortic stenosis.  She does not have a history of previous tobacco use and denies family history of premature CAD.    She was hospitalized 03/2018 in the setting of CP with A. fib with RVR.  Carvedilol was increased to  12.5 mg twice daily and she was started on Eliquis for anticoagulation.  Echo showed normal LVSF with G1DD.  She underwent outpatient Lexiscan Myoview without evidence of ischemia and normal EF.    In 2020, she was diagnosed with carcinoid tumor with metastasis.  This was an incidental finding on CT of the abdomen and done for renal stone back in May.  She has been seen by oncology and when last seen in clinic was undergoing watchful waiting.    She was seen by her primary cardiologist 01/20/2020, at which time she was doing well from a cardiac standpoint without chest pain or worsening dyspnea.  She denied palpitations.  She was tolerating her anticoagulation.  CT of the chest showed aortic atherosclerosis and dilated pulmonary artery.  This was expected, given her known history of mild to moderate pulmonary hypertension.  Labs showed recent creatinine of 1.3.  It was recommended that, if creatinine goes above 1.5, the dose of Eliquis should be decreased to 2.5 mg twice daily.  Blood pressure was well controlled on current medications.  Her most recent LDL was 61 and she was continued on her current statin.  Most recent oncology visit 03/09/2020 for recently diagnosed carcinoid tumor of the small intestine.  Per recent imaging, CT chest/abdomen/pelvis with contrast on 03/08/2019 showed no significant change in size of the right lower quadrant mesenteric soft tissue nodules.  She denied any symptoms of carcinoid syndrome, including flushing, tachycardia, or diarrhea.  She did report baseline chronic diarrhea under good control and occurring once or twice a month.  It was noted she did not require octreotide at that time.   Most recent PCP visit on 05/09/2020 performed to monitor her DM2.  At that time, she noted occasional fluttering of her heart.  No dizziness.  BP was noted to be at goal with visit BP 128/78, HR 91, and weight 213 pounds.  She was taking her medications for her diabetes.  She denied any chest  pain.  She was having a severe gout episode with HCTZ discontinued.  She was started on Norvasc 2.5 mg and Benicar decreased to 20 mg.  It was noted she should come in for BP check.  She had recently stopped her trazodone but reported that she was sleeping well.  She was referred to podiatry.  On most recent podiatry visit 06/06/2020, it was noted that she received a corticosteroid injection for her gout.  This helped significantly.  She was prescribed allopurinol 100 mg daily, given her multiple episodes of acute idiopathic gout flareups.  Gout friendly diet was recommended.  Today, 07/27/2020, she returns to clinic with her daughter and notes fluttering in her chest that occurred approximately 3 weeks ago with 3 episodes of fluttering in her chest per day and then resolved without recurrence since then. She feels she is noticing her Afib more than in the  past and as reflected in ROS on review of previous notes.  Episodes were more noticeable whenever laying down and with increased activity.  In addition, she notes some SOB / chronic  DOE, mainly with walking. She does note that she has been sedentary until recently with some element of deconditioning and wonders the extent to which this contributes to her sx. She has also has  experienced some dizziness.  She has recently increased her activity level significantly from her usual low activity baseline level and reports doing a lot of extra work around the house, including cleaning up and throwing away items throughout the house.  She wonders if this is contributing to her symptoms.  She notes that she does not sleep well, which is an ongoing issue for her.  She typically wakes up at 3:30 AM and stays up for an hour then falls back to sleep.  She notes some recent exacerbations of her gout as above, attributed to her HCTZ, and therefore has received recent prednisone treatment (?urgent care?) and as above taken off her HCTZ. She also received a corticosteroid  injection on 05/12/20 on review of EMR as outlined above. She reports her last dose of prednisone this past Wednesday.  She was started on amlodipine 2.48m in place of HCTZ. Benicar was decreased to 230m She has continued on allopurinol. She has been started on diclofenac cream and also continues on Eliquis 5 mg twice daily and is compliant. She denies any signs or symptoms of bleeding.  She is monitoring her blood pressure at home with SBP 130s to 150s and DBP 60s to 90s.  She reports headache with elevated pressures.  She notes vision changes, attributed to her macular degeneration.  She reports chronic ongoing intermittent diarrhea; however, she has not had any diarrhea for the last 2 weeks. It is unclear if this can be related to her colchicine with recent notes showing a round of colchicine in July versus metformin verus carcinoid tumor of the small intestine.  She reports medication compliance.  No signs or symptoms of bleeding. She follows with pulmonology and podiatry.  Home Medications    Prior to Admission medications   Medication Sig Start Date End Date Taking? Authorizing Provider  acetaminophen-codeine (TYLENOL #3) 300-30 MG tablet Take 1 tablet by mouth every 6 (six) hours as needed for moderate pain. For chronic pain , OA okay to give 30 not 7 07/19/20  Yes Sowles, KrDrue StagerMD  allopurinol (ZYLOPRIM) 100 MG tablet Take 1 tablet (100 mg total) by mouth daily. 06/06/20  Yes EvEdrick KinsDPM  amLODipine (NORVASC) 2.5 MG tablet Take 1 tablet (2.5 mg total) by mouth daily. 05/09/20  Yes Sowles, KrDrue StagerMD  Blood Glucose Monitoring Suppl (ACCU-CHEK AVIVA PLUS) w/Device KIT 1 kit by Does not apply route 3 (three) times daily. 01/09/17  Yes Sowles, KrDrue StagerMD  carvedilol (COREG) 25 MG tablet Take 1 tablet (25 mg total) by mouth 2 (two) times daily. New dose 09/10/19  Yes Sowles, KrDrue StagerMD  COMBIGAN 0.2-0.5 % ophthalmic solution Place 1 drop into both eyes 2 (two) times daily.   Yes [provider]  Cyanocobalamin (B-12) 1000 MCG TABS Take 1,000 mcg daily by mouth.   Yes [provider]  diclofenac (VOLTAREN) 75 MG EC tablet Take 1 tablet (75 mg total) by mouth 2 (two) times daily. 05/09/20  Yes Sowles, KrDrue StagerMD  diclofenac Sodium (VOLTAREN) 1 % GEL Apply topically.   Yes [provider]  Dulaglutide (TRULICITY)  3 MG/0.5ML SOPN Inject 0.5 mLs (3 mg total) as directed once a week. 05/09/20  Yes Sowles, Drue Stager, MD  ELIQUIS 5 MG TABS tablet TAKE 1 TABLET BY MOUTH TWICE A DAY 05/02/20  Yes Wellington Hampshire, MD  empagliflozin (JARDIANCE) 25 MG TABS tablet Take 25 mg by mouth daily. 01/07/20  Yes Sowles, Drue Stager, MD  glucose blood (ACCU-CHEK AVIVA PLUS) test strip Check fasting glucose once each morning, and check glucose as needed. 06/26/18  Yes Hubbard Hartshorn, FNP  Lancets (ACCU-CHEK MULTICLIX) lancets Use as instructed 01/09/17  Yes Sowles, Drue Stager, MD  levothyroxine (SYNTHROID) 50 MCG tablet Take 1 tablet (50 mcg total) by mouth daily before breakfast. 05/10/19  Yes Sowles, Drue Stager, MD  loratadine (CLARITIN) 10 MG tablet TAKE 1 TABLET BY MOUTH EVERY DAY 03/28/20  Yes Sowles, Drue Stager, MD  metFORMIN (GLUCOPHAGE) 850 MG tablet TAKE 1 TABLET (850 MG TOTAL) BY MOUTH 2 (TWO) TIMES DAILY. 05/02/20  Yes Sowles, Drue Stager, MD  olmesartan (BENICAR) 20 MG tablet TAKE 1 TABLET BY MOUTH EVERY DAY 06/08/20  Yes Sowles, Drue Stager, MD  pantoprazole (PROTONIX) 40 MG tablet Take 1 tablet (40 mg total) by mouth daily. 05/09/20  Yes Sowles, Drue Stager, MD  pravastatin (PRAVACHOL) 40 MG tablet TAKE 1 TABLET BY MOUTH EVERY DAY 03/28/20  Yes Sowles, Drue Stager, MD  Travoprost, BAK Free, (TRAVATAN) 0.004 % SOLN ophthalmic solution Place 1 drop into both eyes at bedtime.   Yes [provider]  Vitamin D, Ergocalciferol, (DRISDOL) 1.25 MG (50000 UNIT) CAPS capsule TAKE 1 CAPSULE BY MOUTH WEEKLY 05/02/20  Yes Sowles, Drue Stager, MD    Review of Systems    She denies chest pain, pnd, orthopnea, n,  syncope, edema, weight gain, or early satiety.  She reports palpitations that occurred approximately 3 weeks ago and were intermittent throughout the day at approximately 3 times per day and then suddenly resolved and have not recurred since that time.  She reports diarrhea which has resolved and not recurred over the last 2 weeks.  This is a chronic finding and has been intermittent for years.  She reports some dizziness.  She notes chronic and unchanged DOE, mainly with ambulation.  She notes she chronically wakes at 3:30 AM for 1 hour and then goes back to sleep.  She reports recent gout attacks. She reports HA with elevated BP.  All other systems reviewed and are otherwise negative except as noted above.  Physical Exam    Vital Signs. BP (!) 148/86 (BP Location: Left Arm, Patient Position: Sitting, Cuff Size: Normal)   Pulse 71   Ht 5' 4.5" (1.638 m)   Wt 207 lb 2 oz (94 kg)   LMP  (LMP Unknown)   SpO2 97%   BMI 35.00 kg/m  General: Well developed, well nourished, in no acute distress. Head: Normocephalic, atraumatic, sclera non-icteric, no xanthomas, nares are without discharge. Neck: Negative for carotid bruits. JVP not elevated.    Lungs: Clear bilaterally to auscultation without wheezes, rales, or rhonchi. Breathing is unlabored. Heart: RRR S1 S2, 1/6 systolic murmur RUSB . No rubs, or gallops.  Abdomen: Soft, non-tender, non-distended with normoactive bowel sounds. No rebound/guarding. Extremities: No clubbing or cyanosis. Trace  bilateral edema. Neuro: Alert and oriented X 3. Moves all extremities spontaneously. Psych:  Responds to questions appropriately with a normal affect.  Accessory Clinical Findings    ECG personally reviewed by me today - NSR with sinus arrhythmia, 71 bpm, PR interval 130s milliseconds, QRS duration 86 ms, QTC 386 ms, LVH,  poor conduction in leads III and aVF suspected due to lead placement- no acute changes.  VITALS Reviewed today   Temp Readings from  Last 3 Encounters:  05/09/20 (!) 96.8 F (36 C) (Temporal)  05/09/20 (!) 96.8 F (36 C) (Temporal)  03/09/20 (!) 96.5 F (35.8 C) (Tympanic)   BP Readings from Last 3 Encounters:  07/27/20 (!) 148/86  05/09/20 128/78  05/09/20 128/78   Pulse Readings from Last 3 Encounters:  07/27/20 71  05/09/20 91  05/09/20 91    Wt Readings from Last 3 Encounters:  07/27/20 207 lb 2 oz (94 kg)  05/09/20 213 lb (96.6 kg)  05/09/20 213 lb 3.2 oz (96.7 kg)     LABS  reviewed today   Lab Results  Component Value Date   WBC 10.0 03/09/2020   HGB 11.4 (L) 03/09/2020   HCT 36.7 03/09/2020   MCV 84.0 03/09/2020   PLT 287 03/09/2020   Lab Results  Component Value Date   CREATININE 1.12 (H) 05/09/2020   BUN 15 05/09/2020   NA 141 05/09/2020   K 4.1 05/09/2020   CL 105 05/09/2020   CO2 27 05/09/2020   Lab Results  Component Value Date   ALT 12 03/09/2020   AST 16 03/09/2020   ALKPHOS 53 03/09/2020   BILITOT 0.7 03/09/2020   Lab Results  Component Value Date   CHOL 144 01/07/2020   HDL 49 (L) 01/07/2020   LDLCALC 73 01/07/2020   TRIG 141 01/07/2020   CHOLHDL 2.9 01/07/2020    Lab Results  Component Value Date   HGBA1C 8.0 (A) 05/09/2020   Lab Results  Component Value Date   TSH 1.72 01/07/2020     STUDIES/PROCEDURES reviewed today   Echo - Left ventricle: The cavity size was normal. Systolic function was  normal. The estimated ejection fraction was in the range of 60%  to 65%. Wall motion was normal; there were no regional wall  motion abnormalities. Doppler parameters are consistent with  abnormal left ventricular relaxation (grade 1 diastolic  dysfunction).  - Mitral valve: There was mild regurgitation.  - Left atrium: The atrium was mildly dilated.  - Right ventricle: Systolic function was normal.  - Pulmonary arteries: Systolic pressure was mild to moderately  elevated. PA peak pressure: 47 mm Hg (S).   Assessment & Plan    Paroxysmal atrial  fibrillation Tachypalpitations --NSR today.  Reports fluttering of her chest, which occurred frequently and at least 3 times daily and resolved approximately 3 weeks ago and has not recurred since that time.  She reports stable DOE with activity and intermittent dizziness.   --Etiology of increased symptoms of fluttering/tachypalpitations unclear given her comorbid conditions, including current carcinoid tumor with increased palpitations a symptom of carcinoid syndrome.  In addition to tachypalpitations, she has also noted diarrhea, which is also a symptom of carcinoid syndrome.  She states diarrhea is chronic and usually occurs few weeks then resolves on its own.  Discussed that diarrhea could also lead to electrolyte abnormalities, resulting in palpitations.  Also considered is that she recently has had both corticosteroid injection and oral steroid treatment (reported per daughter as unable to find this medication on review of EMR) for her gout attacks.  She is also s/p thyroid ablation on levothyroxine.  --Obtain TSH, free T4, and BMP to ensure thyroid and renal function stable, as well as that electrolytes are at goal.  If creatinine is above 1.5 on BMET, she will require reduced dose of Eliquis  2.5 mg twice daily.  Continue current medications including carvedilol 110m BID for rate control and anticoagulation with Eliquis 5 mg twice daily for now.  Diclofenac cream contraindicated on anticoagulation due to increased risk of bleeding as discussed by PCP.  She denies any signs or symptoms of bleeding today.     Essential hypertension --Due to repeat gout attacks, antihypertensive medications have been adjusted between her cardiology visits.  She reports elevated home pressures with dizziness and headaches, as well as recent tachypalpitations as above.  Previous olmesartan-HCTZ has been separated with HCTZ discontinued and Benicar decreased down to 20 mg daily.  Amlodipine 2.5 mg has been added to her  regimen.  She remains on carvedilol 25 mg twice daily.  BP today borderline at 130/58.  Continue to monitor home BP.   If TSH WNL and renal function stable, given her home BP reported, recommend increasing Benicar back to previous dose of 40 mg daily for additional BP support. Pending labs, will contact pt regarding increased dose of ARB.   Atherosclerosis by CT --Seen on imagining. Continue current statin.  Glycemic control recommended.  On Eliquis in place of ASA. BP and HR control.   HLD -Continue current statin.  Most recent LDL well controlled at 61.  DM2 -Recommend ongoing glycemic control per PCP for risk factor modification.  Continue current ARB with increased dose recommended as above.  Continue current statin.  Further monitoring and management per PCP/endocrinology.  Pulmonary nodule --Continue to follow with pulmonology / oncology and obtain imaging to monitor as directed.   Consider as contributing to her dyspnea with exertion.  Carcinoid tumor or metastasis  --She has been following with oncology since diagnosed with carcinoid tumor with metastasis and undergoing watchful waiting.  Symptoms of carcinoid syndrome include tachypalpitations and diarrhea; therefore, these sx were carefully documented today to ensure appropriate monitoring in this respect.    Goiter s/p thyroid ablation --Given her tachypalpitations, we will recheck TSH and free T4.  She is followed by endocrinology and on levothyroxine 50 MCG daily.  Last TSH at goal.  Pending labs, further management and monitoring per PCP/endocrinology.  BMI --Ongoing activity and healthy diet recommended for risk factor modification.  CKDIII --We will recheck a BMET today before adjusting any renally processed medications, including her ARB as above with recommendation to increase her Benicar if renal function allows for BP support.  Glycemic and BP control recommended to prevent worsening CKD.  If creatinine is elevated above  1.5, we will reduce Eliquis to 2.5 mg twice daily as above.  Gout flares --Per podiatry and PCP.  Uric acid has been elevated and in the 8 range.  She has been receiving corticosteroid injections and prednisone treatments.  She has also been on colchicine and allopurinol.  She reports ongoing pain, though improving.  She has discontinued her HCTZ.  If BP remains elevated following increased dose of ARB, consider alternative/nonthiazide diuretic.  Medication changes: Pending labs, consider increase of Benicar to 493mdaily.  If creatinine above 1.5, decrease Eliquis to 2.5 mg twice daily.  If BP remains uncontrolled with Benicar 40 mg, addition of nonthiazide diuretic.  As above, diclofenac cream is contraindicated on anticoagulation. Labs ordered: BMET, TSH, free T4. Studies / Imaging ordered: None. Future considerations: See under medication changes.  Continue to monitor for carcinoid syndrome. Disposition: RTC 3 weeks or sooner if needed.     JaArvil ChacoPA-C 07/27/2020

## 2020-07-28 LAB — T4, FREE: Free T4: 1.32 ng/dL (ref 0.82–1.77)

## 2020-07-28 LAB — BASIC METABOLIC PANEL
BUN/Creatinine Ratio: 12 (ref 12–28)
BUN: 11 mg/dL (ref 8–27)
CO2: 22 mmol/L (ref 20–29)
Calcium: 10.7 mg/dL — ABNORMAL HIGH (ref 8.7–10.3)
Chloride: 103 mmol/L (ref 96–106)
Creatinine, Ser: 0.95 mg/dL (ref 0.57–1.00)
GFR calc Af Amer: 63 mL/min/{1.73_m2} (ref >59)
GFR calc non Af Amer: 54 mL/min/{1.73_m2} — ABNORMAL LOW (ref >59)
Glucose: 132 mg/dL — ABNORMAL HIGH (ref 65–99)
Potassium: 4.3 mmol/L (ref 3.5–5.2)
Sodium: 140 mmol/L (ref 134–144)

## 2020-07-28 LAB — TSH: TSH: 3.47 u[IU]/mL (ref 0.450–4.500)

## 2020-07-30 ENCOUNTER — Other Ambulatory Visit: Payer: Self-pay | Admitting: Family Medicine

## 2020-07-30 DIAGNOSIS — I10 Essential (primary) hypertension: Secondary | ICD-10-CM

## 2020-07-30 DIAGNOSIS — N1831 Chronic kidney disease, stage 3a: Secondary | ICD-10-CM

## 2020-07-30 NOTE — Telephone Encounter (Signed)
Requested Prescriptions  Pending Prescriptions Disp Refills   amLODipine (NORVASC) 2.5 MG tablet [Pharmacy Med Name: AMLODIPINE BESYLATE 2.5 MG TAB] 90 tablet 0    Sig: TAKE 1 TABLET BY MOUTH EVERY DAY     Cardiovascular:  Calcium Channel Blockers Failed - 07/30/2020  9:30 AM      Failed - Last BP in normal range    BP Readings from Last 1 Encounters:  07/27/20 (!) 148/86         Passed - Valid encounter within last 6 months    Recent Outpatient Visits          2 months ago Diabetes mellitus with proteinuric diabetic nephropathy [facility])   [facility] [place], Drue Stager, MD   6 months ago Diabetes mellitus with proteinuric diabetic nephropathy [facility])   [facility] [place], Drue Stager, MD   10 months ago Diabetes mellitus with proteinuric diabetic nephropathy [facility])   [facility] Steele Sizer, MD   1 year ago Diabetes mellitus with proteinuric diabetic nephropathy [facility])   [facility] Steele Sizer, MD   1 year ago Bloating   [facility] Steele Sizer, MD      Future Appointments            In 2 weeks Arvil Chaco, PA-C [place], [place]   In 1 month Steele Sizer, MD [facility], [facility]

## 2020-07-31 ENCOUNTER — Telehealth: Payer: Self-pay | Admitting: Physician Assistant

## 2020-07-31 NOTE — Telephone Encounter (Signed)
I spoke with the patient regarding her lab results and Marrianne Mood, PA's recommendations to:  1) Increase olmesartan to 40 mg once daily 2) repeat a BMP in 1 week  The patient verbalizes understanding of these recommendations.  She then advised me that her BP this morning was 137/70. I reviewed her past BP readings in Epic: - 07/27/20: 148/86 - 05/09/20: 128/78 - 03/09/20: 107/61  I confirmed with the patient that she has not been checking her BP consistently at home. I advised her with lower BP readings in the summer & spring, that I wanted to review with Jacquelyn first to see if she would still like to proceed with increasing olmesartan to 40 mg once daily or would she like the patient to check her BP for the next 5-7 days and [place] follow back up with her after that to see what her numbers are at home. The patient is aware I will call her back later after speaking with [place], PA. She voices understanding and is agreeable.

## 2020-07-31 NOTE — Telephone Encounter (Signed)
In July, her medications were adjusted with HCTZ discontinued and olmesartan dose reduced by another provider. At that time, she was started on low dose amlodipine 2.5mg  for BP support with these changes. BP during our visit was elevated at 148/86 with Susan Bowen indicating that she felt OK when SBP was in the 100s and HA recently with home BP lately elevated. We reviewed her home health Bps together during her visit as well. Recommend she monitor her BP 2 hours after her medications with goal BP 130/80 or lower. She likely needs the olmesartan to achieve this goal, given the medication changes made as above.

## 2020-07-31 NOTE — Telephone Encounter (Signed)
Arvil Chaco, PA-C  07/30/2020 10:11 PM EDT     Good news!  Renal function improved and electrolytes at goal.  Thyroid labs normal.   Please have her:  Increase to olmesartan 40mg  daily for BP control.  Repeat BMET in 1 week.   We can recheck her BP at our next visit, as well as her volume status. She should feel free to call the office if any questions before then.

## 2020-08-01 NOTE — Telephone Encounter (Signed)
Patient made aware of Marrianne Mood, PA response and recommendation. She will monitor her BP daily 2 hours after taking her medications with a BP goal of 130/80 or less. She will monitor her BP for 5-7 days.  Patient will call the office on Mon 08/07/20 to report the readings. Adv the patient to contact the office sooner if symptoms develop or her BP is consistently low or elevated.  Patient verbalized understanding and voiced appreciation for the call back.

## 2020-08-10 ENCOUNTER — Encounter: Payer: Self-pay | Admitting: Physician Assistant

## 2020-08-10 ENCOUNTER — Other Ambulatory Visit: Payer: Self-pay

## 2020-08-10 ENCOUNTER — Ambulatory Visit (INDEPENDENT_AMBULATORY_CARE_PROVIDER_SITE_OTHER): Payer: Medicare HMO | Admitting: Physician Assistant

## 2020-08-10 VITALS — BP 156/70 | HR 80 | Ht 64.5 in | Wt 207.0 lb

## 2020-08-10 DIAGNOSIS — Z79899 Other long term (current) drug therapy: Secondary | ICD-10-CM | POA: Diagnosis not present

## 2020-08-10 DIAGNOSIS — E66811 Obesity, class 1: Secondary | ICD-10-CM

## 2020-08-10 DIAGNOSIS — M109 Gout, unspecified: Secondary | ICD-10-CM

## 2020-08-10 DIAGNOSIS — I480 Paroxysmal atrial fibrillation: Secondary | ICD-10-CM

## 2020-08-10 DIAGNOSIS — E785 Hyperlipidemia, unspecified: Secondary | ICD-10-CM

## 2020-08-10 DIAGNOSIS — I10 Essential (primary) hypertension: Secondary | ICD-10-CM | POA: Diagnosis not present

## 2020-08-10 DIAGNOSIS — R0989 Other specified symptoms and signs involving the circulatory and respiratory systems: Secondary | ICD-10-CM

## 2020-08-10 DIAGNOSIS — C7A019 Malignant carcinoid tumor of the small intestine, unspecified portion: Secondary | ICD-10-CM

## 2020-08-10 DIAGNOSIS — E1169 Type 2 diabetes mellitus with other specified complication: Secondary | ICD-10-CM

## 2020-08-10 DIAGNOSIS — I350 Nonrheumatic aortic (valve) stenosis: Secondary | ICD-10-CM | POA: Diagnosis not present

## 2020-08-10 DIAGNOSIS — R002 Palpitations: Secondary | ICD-10-CM | POA: Diagnosis not present

## 2020-08-10 DIAGNOSIS — E669 Obesity, unspecified: Secondary | ICD-10-CM

## 2020-08-10 MED ORDER — OLMESARTAN MEDOXOMIL 40 MG PO TABS: 40.0000 mg | ORAL_TABLET | Freq: Every day | ORAL | 1 refills | Status: DC

## 2020-08-10 NOTE — Patient Instructions (Addendum)
Medication Instructions:  Your physician has recommended you make the following change in your medication:  1- INCREASE Benicar to 40 mg by mouth once a day.  *If you need a refill on your cardiac medications before your next appointment, please call your pharmacy*   Lab Work: Your physician recommends that you return for lab work in: 1 week ~ Thursday, 08/17/20 at the [facility]. - Please go to the [facility]. You will check in at the front desk to the right as you walk into the atrium. Valet Parking is offered if needed. - No appointment needed. You may go any day between 7 am and 6 pm.  If you have labs (blood work) drawn today and your tests are completely normal, you will receive your results only by: Marland Kitchen MyChart Message (if you have MyChart) OR . A paper copy in the mail If you have any lab test that is abnormal or we need to change your treatment, we will call you to review the results.   Testing/Procedures: Your physician has requested that you have a carotid duplex in 2-3 months on same day as 2-3 month follow up if possible. This test is an ultrasound of the carotid arteries in your neck. It looks at blood flow through these arteries that supply the brain with blood. Allow one hour for this exam. There are no restrictions or special instructions.   Follow-Up: At [facility], you and your health needs are our priority.  As part of our continuing mission to provide you with exceptional heart care, we have created designated Provider Care Teams.  These Care Teams include your primary Cardiologist (physician) and Advanced Practice Providers (APPs -  Physician Assistants and Nurse Practitioners) who all work together to provide you with the care you need, when you need it.  We recommend signing up for the patient portal called "MyChart".  Sign up information is provided on this After Visit Summary.  MyChart is used to connect with patients for Virtual Visits (Telemedicine).   Patients are able to view lab/test results, encounter notes, upcoming appointments, etc.  Non-urgent messages can be sent to your provider as well.   To learn more about what you can do with MyChart, go to [email or web address].    Your next appointment:   2-3  month(s)  The format for your next appointment:   In Person  Provider:   You may see Kathlyn Sacramento, MD or one of the following Advanced Practice Providers on your designated Care Team:    Murray Hodgkins, NP  Christell Faith, PA-C  Marrianne Mood, PA-C  Cadence Ortley, [place]

## 2020-08-10 NOTE — Progress Notes (Signed)
prednis   Office Visit    Patient Name: Susan Bowen Date of Encounter: 08/10/2020  Primary Care Provider:  Steele Sizer, MD Primary Cardiologist:  Susan Sacramento, MD  Chief Complaint    Chief Complaint  Patient presents with  . Other    3 week follow up. Patient c.o fluttering in chest and left ankle swelling. Meds reviewed verbally with patient.     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  Past Medical History    Past Medical History:  Diagnosis Date  . Allergy   . Back spasm   . Bunion   . Carpal tunnel syndrome   . Cataracta   . Diabetes mellitus without complication ([place])   . Generalized osteoarthritis   . GERD (gastroesophageal reflux disease)   . Gout   . Hemorrhoids without complication   . Hyperlipidemia   . Hypertension   . Hypothyroidism   . Impingement syndrome of right shoulder   . Insomnia   . Lipoma   . Lung nodule   . Metastatic carcinoid tumor (Susan Bowen) 03/2016  . Neuritis or radiculitis due to rupture of lumbar intervertebral disc   . Obesity   . Osteoporosis   . PAF (paroxysmal atrial fibrillation) ([place])    a. 03/2018 AF w/ RVR-->converted spont.  CHA2DS2VASc = 7-->Eliquis.  . Proteinuria   . Rotator cuff tear   . Systolic murmur    a. 02/980 Echo: EF 60-65%, no rwma, Gr1 DD, mild MR, mildly dil LA. Nl RV fxn. PASP 60mHg.  .Marland KitchenTIA (transient ischemic attack)   . TIA (transient ischemic attack) 1998   small TIA.  no residual effects  . Tinnitus of both ears   . Unspecified glaucoma(365.9)   . Vitamin D deficiency   . Wedge  compression fracture of t11-T12 vertebra, sequela    Past Surgical History:  Procedure Laterality Date  . ABDOMINAL HYSTERECTOMY  1986  . APPENDECTOMY    . BREAST SURGERY Bilateral 1983   biopsy of each side, negative  . CARPAL TUNNEL RELEASE    . CATARACT EXTRACTION W/PHACO Left 09/03/2017   Procedure: CATARACT EXTRACTION PHACO AND INTRAOCULAR LENS PLACEMENT (IOC)-LEFT DIABETIC;  Surgeon: PBirder Robson MD;  Location: [facility] ORS;  Service: Ophthalmology;  Laterality: Left;  [place]:10.2 AP% 16.7 CDE 11.71 Fluid Pack Lot # [id number] . CATARACT EXTRACTION W/PHACO Right 09/30/2017   Procedure: CATARACT EXTRACTION PHACO AND INTRAOCULAR LENS PLACEMENT (IOC);  Surgeon: PBirder Robson MD;  Location: [facility] ORS;  Service: Ophthalmology;  Laterality: Right;  [place]01:35.1 AP% 13.1 CDE 12.44 Fluid pack Lot # [id number]  . COLON SURGERY    . EYE SURGERY Left 09/03/2017   Cataract extraction  . FEMUR FRACTURE SURGERY Left   . FRACTURE SURGERY  2001   left femur  . HEMORROIDECTOMY    . polyp removed from vocal cord      Allergies  Allergies  Allergen Reactions  . Augmentin [Amoxicillin-Pot Clavulanate] Itching    Has patient had a PCN reaction causing immediate rash, facial/tongue/throat swelling, SOB or lightheadedness with hypotension: No Has patient had a PCN reaction causing severe rash involving mucus membranes or skin necrosis: No Has patient had a PCN  reaction that required hospitalization: No Has patient had a PCN reaction occurring within the last 10 years: No If all of the above answers are "NO", then may proceed with Cephalosporin use.     History of Present Illness    Susan Bowen is a 86 y.o. female with PMH as above.  She has a history of paroxysmal atrial fibrillation and mild aortic stenosis.  She does not have a history of previous tobacco use and denies family history of premature CAD.    She was hospitalized 03/2018 in the setting of CP with A. fib with RVR.   Carvedilol was increased to 12.5 mg twice daily and she was started on Eliquis for anticoagulation.  Echo showed normal LVSF with G1DD.  She underwent outpatient Lexiscan Myoview without evidence of ischemia and normal EF.    In 2020, she was diagnosed with carcinoid tumor with metastasis.  This was an incidental finding on CT of the abdomen and done for renal stone back in May.  She has been seen by oncology and when last seen in clinic was undergoing watchful waiting.    She was seen by her primary cardiologist 01/20/2020, at which time she was doing well from a cardiac standpoint without chest pain or worsening dyspnea.  She denied palpitations.  She was tolerating her anticoagulation.  CT of the chest showed aortic atherosclerosis and dilated pulmonary artery.  This was expected, given her known history of mild to moderate pulmonary hypertension.  Labs showed recent creatinine of 1.3.  It was recommended that, if creatinine goes above 1.5, the dose of Eliquis should be decreased to 2.5 mg twice daily.  Blood pressure was well controlled on current medications.  Her most recent LDL was 61 and she was continued on her current statin.  Most recent oncology visit 03/09/2020 for recently diagnosed carcinoid tumor of the small intestine.  Per recent imaging, CT chest/abdomen/pelvis with contrast on 03/08/2019 showed no significant change in size of the right lower quadrant mesenteric soft tissue nodules.  She denied any symptoms of carcinoid syndrome, including flushing, tachycardia, or diarrhea.  She did report baseline chronic diarrhea under good control and occurring once or twice a month.  It was noted she did not require octreotide at that time.   Most recent PCP visit on 05/09/2020 performed to monitor her DM2.  At that time, she noted occasional fluttering of her heart.  No dizziness.  BP was noted to be at goal with visit BP 128/78, HR 91, and weight 213 pounds.  She was taking her medications for her  diabetes.  She denied any chest pain.  She was having a severe gout episode with HCTZ discontinued.  She was started on Norvasc 2.5 mg and Benicar decreased to 20 mg.  It was noted she should come in for BP check.  She had recently stopped her trazodone but reported that she was sleeping well.  She was referred to podiatry.  On most recent podiatry visit 06/06/2020, it was noted that she received a corticosteroid injection for her gout.  This helped significantly.  She was prescribed allopurinol 100 mg daily, given her multiple episodes of acute idiopathic gout flareups.  Gout friendly diet was recommended.  On 07/27/2020, she returns to clinic with her daughter and notes fluttering in her chest that occurred approximately 3 weeks ago with 3 episodes of fluttering in her chest per day and then resolved without recurrence since then. She feels she is noticing her Afib more than  in the past and as reflected in ROS on review of previous notes.  Episodes were more noticeable whenever laying down and with increased activity.  In addition, she notes some SOB / chronic  DOE, mainly with walking. She does note that she has been sedentary until recently with some element of deconditioning and wonders the extent to which this contributes to her sx. She has also has  experienced some dizziness.  She has recently increased her activity level significantly from her usual low activity baseline level and reports doing a lot of extra work around the house, including cleaning up and throwing away items throughout the house.  She wonders if this is contributing to her symptoms.  She notes that she does not sleep well, which is an ongoing issue for her.  She typically wakes up at 3:30 AM and stays up for an hour then falls back to sleep.  She notes some recent exacerbations of her gout as above, attributed to her HCTZ, and therefore has received recent prednisone treatment (?urgent care?) and as above taken off her HCTZ. She also  received a corticosteroid injection on 05/12/20 on review of EMR as outlined above. She reports her last dose of prednisone this past Wednesday.  She was started on amlodipine 2.46m in place of HCTZ. Benicar was decreased to 269m She has continued on allopurinol. She has been started on diclofenac cream and also continues on Eliquis 5 mg twice daily and is compliant. She denies any signs or symptoms of bleeding.  She is monitoring her blood pressure at home with SBP 130s to 150s and DBP 60s to 90s.  She reports headache with elevated pressures.  She notes vision changes, attributed to her macular degeneration.  She reports chronic ongoing intermittent diarrhea; however, she has not had any diarrhea for the last 2 weeks. It is unclear if this can be related to her colchicine with recent notes showing a round of colchicine in July versus metformin verus carcinoid tumor of the small intestine.  She reports medication compliance.  No signs or symptoms of bleeding. She follows with pulmonology and podiatry.  Today, 08/10/2020, she presents to clinic and continues to note fluttering in her chest at times as described above.  She feels like the fluttering has decreased in intensity and frequency since last seen in the office.   BP is noted to be elevated at 156/70 with patient reporting that she has noted elevated blood pressure lately and that she has had more frequent headache and some dizziness. She continues to note some dyspnea when walking but unchanged from previous visits.  She has not needed to receive any further corticosteroid injections and notes that her gout has been well controlled.  She does note some left knee pain, which has been troubling her lately.  She wonders if this is contributing to today's pressure.  She states that she has not yet increased Benicar to 40 mg daily, as she received a phone call that told her to increase to 40 mg daily and another phone call that told her to wait until her visit  today.  Medications were reviewed in detail.  She is compliant with Eliquis and denies any signs or symptoms of bleeding.  She continues to note vision changes, attributed to macular degeneration.   Home Medications    Prior to Admission medications   Medication Sig Start Date End Date Taking? Authorizing Provider  acetaminophen-codeine (TYLENOL #3) 300-30 MG tablet Take 1 tablet by mouth every 6 (  six) hours as needed for moderate pain. For chronic pain , OA okay to give 30 not 7 07/19/20  Yes Sowles, Drue Stager, MD  allopurinol (ZYLOPRIM) 100 MG tablet Take 1 tablet (100 mg total) by mouth daily. 06/06/20  Yes Edrick Kins, DPM  amLODipine (NORVASC) 2.5 MG tablet Take 1 tablet (2.5 mg total) by mouth daily. 05/09/20  Yes Sowles, Drue Stager, MD  Blood Glucose Monitoring Suppl (ACCU-CHEK AVIVA PLUS) w/Device KIT 1 kit by Does not apply route 3 (three) times daily. 01/09/17  Yes Sowles, Drue Stager, MD  carvedilol (COREG) 25 MG tablet Take 1 tablet (25 mg total) by mouth 2 (two) times daily. New dose 09/10/19  Yes Sowles, Drue Stager, MD  COMBIGAN 0.2-0.5 % ophthalmic solution Place 1 drop into both eyes 2 (two) times daily.   Yes [provider]  Cyanocobalamin (B-12) 1000 MCG TABS Take 1,000 mcg daily by mouth.   Yes [provider]  diclofenac (VOLTAREN) 75 MG EC tablet Take 1 tablet (75 mg total) by mouth 2 (two) times daily. 05/09/20  Yes Sowles, Drue Stager, MD  diclofenac Sodium (VOLTAREN) 1 % GEL Apply topically.   Yes [provider]  Dulaglutide (TRULICITY) 3 QM/5.7QI SOPN Inject 0.5 mLs (3 mg total) as directed once a week. 05/09/20  Yes Sowles, Drue Stager, MD  ELIQUIS 5 MG TABS tablet TAKE 1 TABLET BY MOUTH TWICE A DAY 05/02/20  Yes Wellington Hampshire, MD  empagliflozin (JARDIANCE) 25 MG TABS tablet Take 25 mg by mouth daily. 01/07/20  Yes Sowles, Drue Stager, MD  glucose blood (ACCU-CHEK AVIVA PLUS) test strip Check fasting glucose once each morning, and check glucose as needed. 06/26/18  Yes Hubbard Hartshorn, FNP  Lancets (ACCU-CHEK MULTICLIX) lancets Use as instructed 01/09/17  Yes Sowles, Drue Stager, MD  levothyroxine (SYNTHROID) 50 MCG tablet Take 1 tablet (50 mcg total) by mouth daily before breakfast. 05/10/19  Yes Sowles, Drue Stager, MD  loratadine (CLARITIN) 10 MG tablet TAKE 1 TABLET BY MOUTH EVERY DAY 03/28/20  Yes Sowles, Drue Stager, MD  metFORMIN (GLUCOPHAGE) 850 MG tablet TAKE 1 TABLET (850 MG TOTAL) BY MOUTH 2 (TWO) TIMES DAILY. 05/02/20  Yes Sowles, Drue Stager, MD  olmesartan (BENICAR) 20 MG tablet TAKE 1 TABLET BY MOUTH EVERY DAY 06/08/20  Yes Sowles, Drue Stager, MD  pantoprazole (PROTONIX) 40 MG tablet Take 1 tablet (40 mg total) by mouth daily. 05/09/20  Yes Sowles, Drue Stager, MD  pravastatin (PRAVACHOL) 40 MG tablet TAKE 1 TABLET BY MOUTH EVERY DAY 03/28/20  Yes Sowles, Drue Stager, MD  Travoprost, BAK Free, (TRAVATAN) 0.004 % SOLN ophthalmic solution Place 1 drop into both eyes at bedtime.   Yes [provider]  Vitamin D, Ergocalciferol, (DRISDOL) 1.25 MG (50000 UNIT) CAPS capsule TAKE 1 CAPSULE BY MOUTH WEEKLY 05/02/20  Yes Sowles, Drue Stager, MD    Review of Systems    She denies chest pain, pnd, orthopnea, n, syncope, edema, weight gain, or early satiety.  She reports palpitations that are improving and some dizziness.  She notes chronic and unchanged DOE, mainly with ambulation. She reports HA with elevated BP.  All other systems reviewed and are otherwise negative except as noted above.  Physical Exam    Vital Signs. BP (!) 156/70 (BP Location: Right Arm, Patient Position: Sitting, Cuff Size: Normal)   Pulse 80   Ht 5' 4.5" (1.638 m)   Wt 207 lb (93.9 kg)   LMP  (LMP Unknown)   BMI 34.98 kg/m  General: Well developed, well nourished, in no acute distress. Head: Normocephalic,  atraumatic, sclera non-icteric, no xanthomas, nares are without discharge. Neck: R sided carotid bruits. JVP not elevated.    Lungs: Clear bilaterally to auscultation without wheezes, rales, or rhonchi. Breathing  is unlabored. Heart: RRR S1 S2, 1/6 systolic murmur RUSB . No rubs, or gallops.  Abdomen: Soft, non-tender, non-distended with normoactive bowel sounds. No rebound/guarding. Extremities: No clubbing or cyanosis. Moderate bilateral edema. Neuro: Alert and oriented X 3. Moves all extremities spontaneously. Psych:  Responds to questions appropriately with a normal affect.  Accessory Clinical Findings    ECG personally reviewed by me today - NSR, 80bpm, PR interval 150s milliseconds, QRS duration 78 ms, QTC 389 ms, LVH, poor conduction in leads III and aVF not new- no acute changes.  VITALS Reviewed today   Temp Readings from Last 3 Encounters:  05/09/20 (!) 96.8 F (36 C) (Temporal)  05/09/20 (!) 96.8 F (36 C) (Temporal)  03/09/20 (!) 96.5 F (35.8 C) (Tympanic)   BP Readings from Last 3 Encounters:  08/10/20 (!) 156/70  07/27/20 (!) 148/86  05/09/20 128/78   Pulse Readings from Last 3 Encounters:  08/10/20 80  07/27/20 71  05/09/20 91    Wt Readings from Last 3 Encounters:  08/10/20 207 lb (93.9 kg)  07/27/20 207 lb 2 oz (94 kg)  05/09/20 213 lb (96.6 kg)     LABS  reviewed today   Lab Results  Component Value Date   WBC 10.0 03/09/2020   HGB 11.4 (L) 03/09/2020   HCT 36.7 03/09/2020   MCV 84.0 03/09/2020   PLT 287 03/09/2020   Lab Results  Component Value Date   CREATININE 0.95 07/27/2020   BUN 11 07/27/2020   NA 140 07/27/2020   K 4.3 07/27/2020   CL 103 07/27/2020   CO2 22 07/27/2020   Lab Results  Component Value Date   ALT 12 03/09/2020   AST 16 03/09/2020   ALKPHOS 53 03/09/2020   BILITOT 0.7 03/09/2020   Lab Results  Component Value Date   CHOL 144 01/07/2020   HDL 49 (L) 01/07/2020   LDLCALC 73 01/07/2020   TRIG 141 01/07/2020   CHOLHDL 2.9 01/07/2020    Lab Results  Component Value Date   HGBA1C 8.0 (A) 05/09/2020   Lab Results  Component Value Date   TSH 3.470 07/27/2020     STUDIES/PROCEDURES reviewed today    Echo 03/24/2018 - Left ventricle: The cavity size was normal. Systolic function was  normal. The estimated ejection fraction was in the range of 60%  to 65%. Wall motion was normal; there were no regional wall  motion abnormalities. Doppler parameters are consistent with  abnormal left ventricular relaxation (grade 1 diastolic  dysfunction).  - Mitral valve: There was mild regurgitation.  - Left atrium: The atrium was mildly dilated.  - Right ventricle: Systolic function was normal.  - Pulmonary arteries: Systolic pressure was mild to moderately  elevated. PA peak pressure: 47 mm Hg (S).   Assessment & Plan    Paroxysmal atrial fibrillation Tachypalpitations --NSR today. Reports improved fluttering, stable DOE.  Continue current medications including carvedilol 18m BID for rate control and anticoagulation with Eliquis 5 mg twice daily for now.  Diclofenac cream contraindicated on anticoagulation due to increased risk of bleeding as discussed by PCP.  She denies any signs or symptoms of bleeding today.     Essential hypertension --She reports elevated home pressures with headaches, as well as recent tachypalpitations as above.  Previous olmesartan-HCTZ was separated with HCTZ discontinued  and Benicar decreased down to 20 mg daily.  Amlodipine 2.5 mg has been added to her regimen.  She remains on carvedilol 25 mg twice daily.  BP today 156/70 and not well controlled.  Continue to monitor home BP.  Increase Benicar back to previous dose of 40 mg daily for additional BP support. Repeat BMET in 1 week.  If blood pressure still not well controlled on increased Benicar, discussed nonthiazide diuretic. Also discussed increasing amlodipine to 5 mg daily; however, this may worsen lower extremity edema.  Given her palpitations, we could also consider increasing carvedilol to 37.5 mg twice daily; however, this may not provide much BP reduction, especially if pressure is elevated due to  volume status.  Reassess after increasing Benicar if further medication changes are needed at that time.  Right-sided bruit --Reports occasional issues with vision/dizziness.  Right-sided bruit appreciated.  Ultrasound duplex ordered.  Continue heart rate, blood pressure, and statin control.  Atherosclerosis by CT --Seen on imagining. Continue current statin.  Glycemic control recommended.  On Eliquis in place of ASA. BP and HR control.   HLD -Continue current statin.  Most recent LDL well controlled at 61.  DM2 -Recommend ongoing glycemic control per PCP for risk factor modification.  Increase ARB as above.  Continue current statin.  Further monitoring and management per PCP/endocrinology.  Pulmonary nodule --Continue to follow with pulmonology / oncology and obtain imaging to monitor as directed.   Consider as contributing to her dyspnea with exertion.  Carcinoid tumor or metastasis  --She has been following with oncology since diagnosed with carcinoid tumor with metastasis and undergoing watchful waiting.  Symptoms of carcinoid syndrome include tachypalpitations and diarrhea; therefore, these sx were carefully documented today to ensure appropriate monitoring in this respect.    Goiter s/p thyroid ablation --Given her tachypalpitations, TSH rechecked and WNL.  She is followed by endocrinology and on levothyroxine 50 MCG daily.  Further management and monitoring per PCP/endocrinology.  BMI --Ongoing activity and healthy diet recommended for risk factor modification.  CKDIII --Recheck BMET in 1 week after increased Benicar.  Glycemic and BP control recommended to prevent worsening CKD.  If creatinine is elevated above 1.5, we will reduce Eliquis to 2.5 mg twice daily as above.  Gout flares --Per podiatry and PCP.  Uric acid has been elevated and in the 8 range.  No further corticosteroid injections have been performed /prednisone treatments.  She has been taking colchicine and  allopurinol.  She reports ongoing pain, though improving edema.  HCTZ discontinued at previous visit.  If BP remains elevated following increased dose of ARB, consider alternative/nonthiazide diuretic versus increased amlodipine or carvedilol.  Medication changes: Increase to Benicar to 49m daily.  If creatinine above 1.5, decrease Eliquis to 2.5 mg twice daily.  If BP remains uncontrolled with Benicar 40 mg, addition of nonthiazide diuretic.  Also consider dose increase in amlodipine and carvedilol; however, will defer for now.  As above, diclofenac cream is contraindicated on anticoagulation. Labs ordered: BMET in 1 week Studies / Imaging ordered: Carotids Future considerations: See under medication changes.  Continue to monitor for carcinoid syndrome. Disposition: Return to clinic in 2 to 3 months or sooner if needed  JArvil Chaco PA-C 08/10/2020

## 2020-08-14 ENCOUNTER — Other Ambulatory Visit: Payer: Self-pay | Admitting: Family Medicine

## 2020-08-14 DIAGNOSIS — E1169 Type 2 diabetes mellitus with other specified complication: Secondary | ICD-10-CM

## 2020-08-14 DIAGNOSIS — E1121 Type 2 diabetes mellitus with diabetic nephropathy: Secondary | ICD-10-CM

## 2020-08-14 DIAGNOSIS — E785 Hyperlipidemia, unspecified: Secondary | ICD-10-CM

## 2020-08-17 ENCOUNTER — Ambulatory Visit: Payer: Medicare HMO | Admitting: Physician Assistant

## 2020-08-17 ENCOUNTER — Other Ambulatory Visit
Admission: RE | Admit: 2020-08-17 | Discharge: 2020-08-17 | Disposition: A | Discharge status: Home / Self Care | Payer: Medicare HMO | Attending: Physician Assistant | Admitting: Physician Assistant

## 2020-08-17 DIAGNOSIS — I10 Essential (primary) hypertension: Secondary | ICD-10-CM

## 2020-08-17 DIAGNOSIS — R0989 Other specified symptoms and signs involving the circulatory and respiratory systems: Secondary | ICD-10-CM

## 2020-08-17 LAB — BASIC METABOLIC PANEL
Anion gap: 10 (ref 5–15)
BUN: 8 mg/dL (ref 8–23)
CO2: 25 mmol/L (ref 22–32)
Calcium: 9 mg/dL (ref 8.9–10.3)
Chloride: 104 mmol/L (ref 98–111)
Creatinine, Ser: 1.1 mg/dL — ABNORMAL HIGH (ref 0.44–1.00)
GFR, Estimated: 45 mL/min — ABNORMAL LOW (ref >60)
Glucose, Bld: 208 mg/dL — ABNORMAL HIGH (ref 70–99)
Potassium: 4 mmol/L (ref 3.5–5.1)
Sodium: 139 mmol/L (ref 135–145)

## 2020-08-22 DIAGNOSIS — I10 Essential (primary) hypertension: Secondary | ICD-10-CM

## 2020-08-22 DIAGNOSIS — N1831 Chronic kidney disease, stage 3a: Secondary | ICD-10-CM

## 2020-08-25 MED ORDER — AMLODIPINE BESYLATE 5 MG PO TABS: 5.0000 mg | ORAL_TABLET | Freq: Every day | ORAL | 5 refills | Status: DC

## 2020-08-25 NOTE — Addendum Note (Signed)
Addended by: Lamar Laundry on: 08/25/2020 02:48 PM   Modules accepted: Orders

## 2020-08-30 DIAGNOSIS — Z23 Encounter for immunization: Secondary | ICD-10-CM | POA: Diagnosis not present

## 2020-09-05 ENCOUNTER — Other Ambulatory Visit: Payer: Self-pay | Admitting: Family Medicine

## 2020-09-05 ENCOUNTER — Other Ambulatory Visit: Payer: Self-pay | Admitting: Podiatry

## 2020-09-05 DIAGNOSIS — E1121 Type 2 diabetes mellitus with diabetic nephropathy: Secondary | ICD-10-CM

## 2020-09-05 DIAGNOSIS — M109 Gout, unspecified: Secondary | ICD-10-CM

## 2020-09-05 DIAGNOSIS — N1831 Chronic kidney disease, stage 3a: Secondary | ICD-10-CM

## 2020-09-05 DIAGNOSIS — E1169 Type 2 diabetes mellitus with other specified complication: Secondary | ICD-10-CM

## 2020-09-05 DIAGNOSIS — E785 Hyperlipidemia, unspecified: Secondary | ICD-10-CM

## 2020-09-05 DIAGNOSIS — I10 Essential (primary) hypertension: Secondary | ICD-10-CM

## 2020-09-05 NOTE — Telephone Encounter (Signed)
Please advise 

## 2020-09-08 ENCOUNTER — Inpatient Hospital Stay: Payer: Medicare HMO | Attending: Oncology

## 2020-09-08 ENCOUNTER — Other Ambulatory Visit: Payer: Self-pay

## 2020-09-08 DIAGNOSIS — R590 Localized enlarged lymph nodes: Secondary | ICD-10-CM | POA: Diagnosis not present

## 2020-09-08 DIAGNOSIS — K219 Gastro-esophageal reflux disease without esophagitis: Secondary | ICD-10-CM | POA: Insufficient documentation

## 2020-09-08 DIAGNOSIS — C7A019 Malignant carcinoid tumor of the small intestine, unspecified portion: Secondary | ICD-10-CM

## 2020-09-08 DIAGNOSIS — Z7901 Long term (current) use of anticoagulants: Secondary | ICD-10-CM | POA: Insufficient documentation

## 2020-09-08 DIAGNOSIS — M1990 Unspecified osteoarthritis, unspecified site: Secondary | ICD-10-CM | POA: Diagnosis not present

## 2020-09-08 DIAGNOSIS — Z7984 Long term (current) use of oral hypoglycemic drugs: Secondary | ICD-10-CM | POA: Diagnosis not present

## 2020-09-08 DIAGNOSIS — E785 Hyperlipidemia, unspecified: Secondary | ICD-10-CM | POA: Insufficient documentation

## 2020-09-08 DIAGNOSIS — C7B8 Other secondary neuroendocrine tumors: Secondary | ICD-10-CM | POA: Insufficient documentation

## 2020-09-08 DIAGNOSIS — E669 Obesity, unspecified: Secondary | ICD-10-CM | POA: Diagnosis not present

## 2020-09-08 DIAGNOSIS — Z79899 Other long term (current) drug therapy: Secondary | ICD-10-CM | POA: Diagnosis not present

## 2020-09-08 DIAGNOSIS — M810 Age-related osteoporosis without current pathological fracture: Secondary | ICD-10-CM | POA: Diagnosis not present

## 2020-09-08 DIAGNOSIS — E559 Vitamin D deficiency, unspecified: Secondary | ICD-10-CM | POA: Insufficient documentation

## 2020-09-08 DIAGNOSIS — E119 Type 2 diabetes mellitus without complications: Secondary | ICD-10-CM | POA: Insufficient documentation

## 2020-09-08 DIAGNOSIS — D649 Anemia, unspecified: Secondary | ICD-10-CM | POA: Diagnosis not present

## 2020-09-08 DIAGNOSIS — I700 Atherosclerosis of aorta: Secondary | ICD-10-CM | POA: Diagnosis not present

## 2020-09-08 DIAGNOSIS — I272 Pulmonary hypertension, unspecified: Secondary | ICD-10-CM | POA: Insufficient documentation

## 2020-09-08 DIAGNOSIS — E039 Hypothyroidism, unspecified: Secondary | ICD-10-CM | POA: Insufficient documentation

## 2020-09-08 DIAGNOSIS — G4700 Insomnia, unspecified: Secondary | ICD-10-CM | POA: Diagnosis not present

## 2020-09-08 DIAGNOSIS — Z8673 Personal history of transient ischemic attack (TIA), and cerebral infarction without residual deficits: Secondary | ICD-10-CM | POA: Insufficient documentation

## 2020-09-08 DIAGNOSIS — I10 Essential (primary) hypertension: Secondary | ICD-10-CM | POA: Diagnosis not present

## 2020-09-08 DIAGNOSIS — I480 Paroxysmal atrial fibrillation: Secondary | ICD-10-CM | POA: Diagnosis not present

## 2020-09-08 DIAGNOSIS — N189 Chronic kidney disease, unspecified: Secondary | ICD-10-CM | POA: Diagnosis not present

## 2020-09-08 LAB — COMPREHENSIVE METABOLIC PANEL
ALT: 12 U/L (ref 0–44)
AST: 13 U/L — ABNORMAL LOW (ref 15–41)
Albumin: 3.4 g/dL — ABNORMAL LOW (ref 3.5–5.0)
Alkaline Phosphatase: 51 U/L (ref 38–126)
Anion gap: 7 (ref 5–15)
BUN: 8 mg/dL (ref 8–23)
CO2: 26 mmol/L (ref 22–32)
Calcium: 9.1 mg/dL (ref 8.9–10.3)
Chloride: 108 mmol/L (ref 98–111)
Creatinine, Ser: 0.95 mg/dL (ref 0.44–1.00)
GFR, Estimated: 58 mL/min — ABNORMAL LOW (ref >60)
Glucose, Bld: 144 mg/dL — ABNORMAL HIGH (ref 70–99)
Potassium: 3.8 mmol/L (ref 3.5–5.1)
Sodium: 141 mmol/L (ref 135–145)
Total Bilirubin: 0.4 mg/dL (ref 0.3–1.2)
Total Protein: 6.6 g/dL (ref 6.5–8.1)

## 2020-09-08 LAB — CBC WITH DIFFERENTIAL/PLATELET
Abs Immature Granulocytes: 0.03 10*3/uL (ref 0.00–0.07)
Basophils Absolute: 0.1 10*3/uL (ref 0.0–0.1)
Basophils Relative: 0 %
Eosinophils Absolute: 0.2 10*3/uL (ref 0.0–0.5)
Eosinophils Relative: 2 %
HCT: 33.3 % — ABNORMAL LOW (ref 36.0–46.0)
Hemoglobin: 10.2 g/dL — ABNORMAL LOW (ref 12.0–15.0)
Immature Granulocytes: 0 %
Lymphocytes Relative: 28 %
Lymphs Abs: 3.3 10*3/uL (ref 0.7–4.0)
MCH: 25.2 pg — ABNORMAL LOW (ref 26.0–34.0)
MCHC: 30.6 g/dL (ref 30.0–36.0)
MCV: 82.4 fL (ref 80.0–100.0)
Monocytes Absolute: 0.7 10*3/uL (ref 0.1–1.0)
Monocytes Relative: 6 %
Neutro Abs: 7.4 10*3/uL (ref 1.7–7.7)
Neutrophils Relative %: 64 %
Platelets: 315 10*3/uL (ref 150–400)
RBC: 4.04 MIL/uL (ref 3.87–5.11)
RDW: 16.7 % — ABNORMAL HIGH (ref 11.5–15.5)
WBC: 11.6 10*3/uL — ABNORMAL HIGH (ref 4.0–10.5)
nRBC: 0 % (ref 0.0–0.2)

## 2020-09-11 ENCOUNTER — Other Ambulatory Visit: Payer: Self-pay

## 2020-09-11 ENCOUNTER — Ambulatory Visit
Admission: RE | Admit: 2020-09-11 | Discharge: 2020-09-11 | Disposition: A | Discharge status: Home / Self Care | Payer: Medicare HMO | Source: Ambulatory Visit | Attending: Oncology | Admitting: Oncology

## 2020-09-11 DIAGNOSIS — R590 Localized enlarged lymph nodes: Secondary | ICD-10-CM | POA: Diagnosis not present

## 2020-09-11 DIAGNOSIS — C7A019 Malignant carcinoid tumor of the small intestine, unspecified portion: Secondary | ICD-10-CM | POA: Diagnosis not present

## 2020-09-11 DIAGNOSIS — Z85068 Personal history of other malignant neoplasm of small intestine: Secondary | ICD-10-CM | POA: Diagnosis not present

## 2020-09-11 DIAGNOSIS — Z90710 Acquired absence of both cervix and uterus: Secondary | ICD-10-CM | POA: Diagnosis not present

## 2020-09-11 DIAGNOSIS — I700 Atherosclerosis of aorta: Secondary | ICD-10-CM | POA: Diagnosis not present

## 2020-09-11 MED ORDER — IOHEXOL 300 MG/ML  SOLN
75.0000 mL | Freq: Once | INTRAMUSCULAR | Status: AC | PRN
  Administered 2020-09-11: 75 mL via INTRAVENOUS

## 2020-09-11 NOTE — Progress Notes (Signed)
Name: Susan Bowen   MRN: [id number]    DOB: 13-Oct-1932   Date:09/12/2020       Progress Note  Subjective  Chief Complaint  Follow up   HPI    DMII with microalbuminuria; she is taking medications,ARB and statin therapy. FSBS at home fasting has been 124-180, most of the time is in the mid 130's  She has occasional polyphagia, polydipsia but nopolyuria. Her hgbA1C was 10.5%, dropped to8.5% 7.8%, 7.5% , up to 7.8%  ,8 % today is 7.8 % - that is good control for her based on her ageShe is still following a diabetic diet most of the time.She has been taking Metformin twice daily, Trulicity, Jardiance and is doing welll  HTN: she has been taking medication. No chest pain, but occasionally has heart flutter. HCTZ has been stopped because of gout, last visit we decrease benicar to 20 mg but since she went to cardiologist Benicar was raised again to 40 mg  She also takes Norvasc. BP today is at goal, wondering if the dose change is causing some orthostatic changes  BP went up to 152/88 when standing, but she did not feel dizzy, she will check bp at home next time symptoms occurs, advised to get up slowly   Afib : under the care of Dr. Fletcher Anon, rate controlled,she hassob with activity but very seldom, she still has some palpitation intermittently about 4 times a week. .  On Eliquis, denies any bleeding , explained increase change of bleeding with diclofenac and to be cautious about it   Hyperlipidemia: taking Pravastatin and denies side effects of medications, no myalgia. Last LDL was 73    Pulmonary nodule: seen by Dr. Raul Del, lung nodule stableand finished CT in 2017 .She is no longer seeing him. She is now seeing Dr. Janese Banks and had a CT chest done 08/2019. CT showed possible pulmonary hypertension. Pulmonary nodules unchanged since 2017.   Aorta atherosclerosis on CT chest: on statin and Eliquis, unchanged   Carcinoid tumor with metastasis: incidental finding onCT abdomen done  for evaluation of renal stone back in 03/26/2019, it showed retroperitoneal lymphadenopathy, she has since seen Dr. Janese Banks and has been diagnosed with carcinoid tumor with metastasis and going to have watchful waiting. since she has been asymptomatic. Last chromogranin A was high at 23 - done 11/2018, she just had labs done last week but result is not back yet. CBC showed mild elevation of white count and drop of HCT, she has follow up with oncologist in two days. Episodes of diarrhea are not as frequent, states once in the past two weeks.   Dizziness: she has noticed some lightheadedness over the past few weeks, no spinning sensation , she states it happens when she stands up but resolves when she sits down. She feels some whooshing sensation on her head, but no chest pain. She denies nausea or vomiting . No weakness   Weight loss: she noticed she lost weight when moving to her son's house about 6 weeks ago, she was busy and was skipping meals, since than she cut off on her carbohydrates, has been drinking more water and has lost some more weight. She has lost 9 lbs since July - based on our scales . Advised to try keeping her weight as is from now on     Goiter: s/p thyroid ablation, Summer 2016. Seeing Dr. Gabriel Carina, she is on Levothyroxine28mg daily, last TSH was at goal 01/2020   Insomnia: she is sleeping well, taking medication  seldom now   Morbid obesity: BMI above 35 with co-morbidities ( DM, HTN, gout) she is also 87 and has co-morbidities,she lost weight since last visit, advised to try not to lose anymore   CKI stage III: stable, no pruritis. Good urine output.   Gout: she is under the care of podiatrist, having mild pain today   Intermittent right lower back pain: Tramadol does not help but Tylenol # 3 given by dentist seems to control her pain better. Did not ask for refill today   Patient Active Problem List   Diagnosis Date Noted  . Carcinoid tumor, malignant ([place]) 09/08/2019   . Pain in joint of left shoulder 02/25/2019  . Pulmonary hypertension, unspecified ([place]) 12/28/2018  . Paroxysmal A-fib ([place]) 12/28/2018  . Chronic kidney disease, stage III (moderate) ([place]) 04/01/2018  . Elevated parathyroid hormone 01/01/2018  . Elevated uric acid in blood 04/29/2017  . Plantar fasciitis of left foot 03/12/2016  . Postprocedural hypothyroidism 01/23/2016  . Aortic stenosis 09/26/2015  . Allergic rhinitis, seasonal 08/08/2015  . Benign hypertension 08/08/2015  . History of pneumonia 08/08/2015  . Carpal tunnel syndrome 08/08/2015  . Cataract 08/08/2015  . Insomnia, persistent 08/08/2015  . Dyslipidemia 08/08/2015  . History of depression 08/08/2015  . Glaucoma 08/08/2015  . Controlled gout 08/08/2015  . Fever blister 08/08/2015  . Bilateral hearing loss 08/08/2015  . Hemorrhoid 08/08/2015  . H/O iron deficiency anemia 08/08/2015  . Benign neoplasm of colon 08/08/2015  . Impingement syndrome of shoulder 08/08/2015  . Osteoporosis, post-menopausal 08/08/2015  . Morbid obesity ([place]) 08/08/2015  . Generalized OA 08/08/2015  . Multinodular goiter 08/08/2015  . Asymptomatic varicose veins 08/08/2015  . Polyp of vocal cord 08/08/2015  . History of vertebral compression fracture 08/08/2015  . Diabetes mellitus with proteinuric diabetic nephropathy ([place]) 08/08/2015  . LVH (left ventricular hypertrophy) 08/08/2015  . Moderate tricuspid regurgitation 08/08/2015  . History of radioactive iodine thyroid ablation 08/08/2015  . Lung nodule, solitary 05/12/2014  . Chronic cough 05/11/2014  . Vitamin D deficiency 12/20/2009  . Plantar fascial fibromatosis 12/20/2009  . Personal history of fall 07/20/2008  . Cervical radiculitis 05/11/2008    Past Surgical History:  Procedure Laterality Date  . ABDOMINAL HYSTERECTOMY  1986  . APPENDECTOMY    . BREAST SURGERY Bilateral 1983   biopsy of each side, negative  . CARPAL TUNNEL RELEASE    . CATARACT EXTRACTION W/PHACO  Left 09/03/2017   Procedure: CATARACT EXTRACTION PHACO AND INTRAOCULAR LENS PLACEMENT (IOC)-LEFT DIABETIC;  Surgeon: Birder Robson, MD;  Location: [facility] ORS;  Service: Ophthalmology;  Laterality: Left;  [place] 01:10.2 AP% 16.7 CDE 11.71 Fluid Pack Lot # [id number]  . CATARACT EXTRACTION W/PHACO Right 09/30/2017   Procedure: CATARACT EXTRACTION PHACO AND INTRAOCULAR LENS PLACEMENT (IOC);  Surgeon: Birder Robson, MD;  Location: [facility] ORS;  Service: Ophthalmology;  Laterality: Right;  [place] 01:35.1 AP% 13.1 CDE 12.44 Fluid pack Lot # [id number]  . COLON SURGERY    . EYE SURGERY Left 09/03/2017   Cataract extraction  . FEMUR FRACTURE SURGERY Left   . FRACTURE SURGERY  2001   left femur  . HEMORROIDECTOMY    . polyp removed from vocal cord      Family History  Problem Relation Age of Onset  . Diabetes Mother   . Hypertension Mother   . Dementia Mother   . Hypertension Son     Social History   Tobacco Use  . Smoking status: Never Smoker  . Smokeless tobacco: Never Used  Substance Use Topics  . Alcohol use: No    Alcohol/week: 0.0 standard drinks     Current Outpatient Medications:  .  acetaminophen-codeine (TYLENOL #3) 300-30 MG tablet, Take 1 tablet by mouth every 6 (six) hours as needed for moderate pain. For chronic pain , OA okay to give 30 not 7, Disp: 30 tablet, Rfl: 0 .  allopurinol (ZYLOPRIM) 100 MG tablet, TAKE 1 TABLET BY MOUTH EVERY DAY, Disp: 90 tablet, Rfl: 1 .  amLODipine (NORVASC) 5 MG tablet, Take 1 tablet (5 mg total) by mouth daily., Disp: 90 tablet, Rfl: 1 .  Blood Glucose Monitoring Suppl (ACCU-CHEK AVIVA PLUS) w/Device KIT, 1 kit by Does not apply route 3 (three) times daily., Disp: 1 kit, Rfl: 0 .  carvedilol (COREG) 25 MG tablet, TAKE 1 TABLET (25 MG TOTAL) BY MOUTH 2 (TWO) TIMES DAILY. NEW DOSE, Disp: 180 tablet, Rfl: 1 .  COMBIGAN 0.2-0.5 % ophthalmic solution, Place 1 drop into both eyes 2 (two) times daily., Disp: , Rfl:  .  Cyanocobalamin (B-12) 1000 MCG  TABS, Take 1,000 mcg daily by mouth., Disp: , Rfl:  .  cyclobenzaprine (FLEXERIL) 10 MG tablet, Take 10 mg by mouth 3 (three) times daily as needed., Disp: , Rfl:  .  diclofenac Sodium (VOLTAREN) 1 % GEL, Apply topically., Disp: , Rfl:  .  Dulaglutide (TRULICITY) 3 XN/2.3FT SOPN, Inject 3 mg as directed once a week., Disp: 12 mL, Rfl: 1 .  ELIQUIS 5 MG TABS tablet, TAKE 1 TABLET BY MOUTH TWICE A DAY, Disp: 180 tablet, Rfl: 1 .  glucose blood (ACCU-CHEK AVIVA PLUS) test strip, Check fasting glucose once each morning, and check glucose as needed., Disp: 100 each, Rfl: 12 .  JARDIANCE 25 MG TABS tablet, TAKE 25 MG BY MOUTH DAILY., Disp: 90 tablet, Rfl: 1 .  Lancets (ACCU-CHEK MULTICLIX) lancets, Use as instructed, Disp: 204 each, Rfl: 12 .  levothyroxine (SYNTHROID) 50 MCG tablet, Take 1 tablet (50 mcg total) by mouth daily before breakfast., Disp: 90 tablet, Rfl: 1 .  loratadine (CLARITIN) 10 MG tablet, TAKE 1 TABLET BY MOUTH EVERY DAY, Disp: 90 tablet, Rfl: 2 .  metFORMIN (GLUCOPHAGE) 850 MG tablet, TAKE 1 TABLET (850 MG TOTAL) BY MOUTH 2 (TWO) TIMES DAILY., Disp: 180 tablet, Rfl: 1 .  olmesartan (BENICAR) 40 MG tablet, Take 1 tablet (40 mg total) by mouth daily., Disp: 90 tablet, Rfl: 1 .  pantoprazole (PROTONIX) 40 MG tablet, Take 1 tablet (40 mg total) by mouth daily., Disp: 90 tablet, Rfl: 1 .  pravastatin (PRAVACHOL) 40 MG tablet, TAKE 1 TABLET BY MOUTH EVERY DAY, Disp: 90 tablet, Rfl: 1 .  Travoprost, BAK Free, (TRAVATAN) 0.004 % SOLN ophthalmic solution, Place 1 drop into both eyes at bedtime., Disp: , Rfl:  .  traZODone (DESYREL) 50 MG tablet, TAKE 0.5 1 TABLETS (25 50 MG TOTAL) BY MOUTH AT BEDTIME AS NEEDED FOR SLEEP., Disp: , Rfl:  .  Vitamin D, Ergocalciferol, (DRISDOL) 1.25 MG (50000 UNIT) CAPS capsule, TAKE 1 CAPSULE BY MOUTH WEEKLY, Disp: 12 capsule, Rfl: 1  Allergies  Allergen Reactions  . Augmentin [Amoxicillin-Pot Clavulanate] Itching    Has patient had a PCN reaction causing  immediate rash, facial/tongue/throat swelling, SOB or lightheadedness with hypotension: No Has patient had a PCN reaction causing severe rash involving mucus membranes or skin necrosis: No Has patient had a PCN reaction that required hospitalization: No Has patient had a PCN reaction occurring within the last 10 years: No If all of  the above answers are "NO", then may proceed with Cephalosporin use.     I personally reviewed active problem list, medication list, allergies, family history, social history, health maintenance with the patient/caregiver today.   ROS  Constitutional: Negative for fever or weight change.  Respiratory: Negative for cough and shortness of breath.   Cardiovascular: Negative for chest pain or palpitations.  Gastrointestinal: Negative for abdominal pain, no bowel changes.  Musculoskeletal: Negative for gait problem or joint swelling.  Skin: Negative for rash.  Neurological: Negative for dizziness or headache.  No other specific complaints in a complete review of systems (except as listed in HPI above).  Objective  Vitals:   09/12/20 1035  BP: 132/78  Pulse: 95  Resp: 16  Temp: 98.1 F (36.7 C)  TempSrc: Oral  SpO2: 99%  Weight: 204 lb 6.4 oz (92.7 kg)  Height: 5' 5"  (1.651 m)    Body mass index is 34.01 kg/m.  Physical Exam  Constitutional: Patient appears well-developed and well-nourished. Obese  No distress.  HEENT: head atraumatic, normocephalic, pupils equal and reactive to light,neck supple Cardiovascular: Normal rate, regular rhythm and normal heart sounds.  No murmur heard. No BLE edema. Pulmonary/Chest: Effort normal and breath sounds normal. No respiratory distress. Abdominal: Soft.  There is no tenderness. Psychiatric: Patient has a normal mood and affect. behavior is normal. Judgment and thought content normal.  Recent Results (from the past 2160 hour(s))  Basic metabolic panel     Status: Abnormal   Collection Time: 07/27/20  2:55  PM  Result Value Ref Range   Glucose 132 (H) 65 - 99 mg/dL   BUN 11 8 - 27 mg/dL   Creatinine, Ser 0.95 0.57 - 1.00 mg/dL   GFR calc non Af Amer 54 (L) >59 mL/min/1.73   GFR calc Af Amer 63 >59 mL/min/1.73    Comment: **[facility] currently reports eGFR in compliance with the current**   recommendations of the [employer]. [facility] will   update reporting as new guidelines are published from the NKF-ASN   Task force.    BUN/Creatinine Ratio 12 12 - 28   Sodium 140 134 - 144 mmol/L   Potassium 4.3 3.5 - 5.2 mmol/L   Chloride 103 96 - 106 mmol/L   CO2 22 20 - 29 mmol/L   Calcium 10.7 (H) 8.7 - 10.3 mg/dL  TSH     Status: None   Collection Time: 07/27/20  2:55 PM  Result Value Ref Range   TSH 3.470 0.450 - 4.500 uIU/mL  T4, free     Status: None   Collection Time: 07/27/20  2:55 PM  Result Value Ref Range   Free T4 1.32 0.82 - 1.77 ng/dL  Basic metabolic panel     Status: Abnormal   Collection Time: 08/17/20 11:46 AM  Result Value Ref Range   Sodium 139 135 - 145 mmol/L   Potassium 4.0 3.5 - 5.1 mmol/L   Chloride 104 98 - 111 mmol/L   CO2 25 22 - 32 mmol/L   Glucose, Bld 208 (H) 70 - 99 mg/dL    Comment: Glucose reference range applies only to samples taken after fasting for at least 8 hours.   BUN 8 8 - 23 mg/dL   Creatinine, Ser 1.10 (H) 0.44 - 1.00 mg/dL   Calcium 9.0 8.9 - 10.3 mg/dL   GFR, Estimated 45 (L) >60 mL/min   Anion gap 10 5 - 15    Comment: Performed at [facility], [address] [place], [place] [address]  Comprehensive metabolic panel     Status: Abnormal   Collection Time: 09/08/20 10:13 AM  Result Value Ref Range   Sodium 141 135 - 145 mmol/L   Potassium 3.8 3.5 - 5.1 mmol/L   Chloride 108 98 - 111 mmol/L   CO2 26 22 - 32 mmol/L   Glucose, Bld 144 (H) 70 - 99 mg/dL    Comment: Glucose reference range applies only to samples taken after fasting for at least 8 hours.   BUN 8 8 - 23 mg/dL   Creatinine, Ser 0.95 0.44 - 1.00 mg/dL    Calcium 9.1 8.9 - 10.3 mg/dL   Total Protein 6.6 6.5 - 8.1 g/dL   Albumin 3.4 (L) 3.5 - 5.0 g/dL   AST 13 (L) 15 - 41 U/L   ALT 12 0 - 44 U/L   Alkaline Phosphatase 51 38 - 126 U/L   Total Bilirubin 0.4 0.3 - 1.2 mg/dL   GFR, Estimated 58 (L) >60 mL/min    Comment: (NOTE) Calculated using the CKD-EPI Creatinine Equation (2021)    Anion gap 7 5 - 15    Comment: Performed at [facility], [address] [place], [place] [address]  CBC with Differential/Platelet     Status: Abnormal   Collection Time: 09/08/20 10:13 AM  Result Value Ref Range   WBC 11.6 (H) 4.0 - 10.5 K/uL   RBC 4.04 3.87 - 5.11 MIL/uL   Hemoglobin 10.2 (L) 12.0 - 15.0 g/dL   HCT 33.3 (L) 36 - 46 %   MCV 82.4 80.0 - 100.0 fL   MCH 25.2 (L) 26.0 - 34.0 pg   MCHC 30.6 30.0 - 36.0 g/dL   RDW 16.7 (H) 11.5 - 15.5 %   Platelets 315 150 - 400 K/uL   nRBC 0.0 0.0 - 0.2 %   Neutrophils Relative % 64 %   Neutro Abs 7.4 1.7 - 7.7 K/uL   Lymphocytes Relative 28 %   Lymphs Abs 3.3 0.7 - 4.0 K/uL   Monocytes Relative 6 %   Monocytes Absolute 0.7 0.1 - 1.0 K/uL   Eosinophils Relative 2 %   Eosinophils Absolute 0.2 0.0 - 0.5 K/uL   Basophils Relative 0 %   Basophils Absolute 0.1 0.0 - 0.1 K/uL   Immature Granulocytes 0 %   Abs Immature Granulocytes 0.03 0.00 - 0.07 K/uL    Comment: Performed at [facility], [place], [place] [address]  POCT HgB A1C     Status: Abnormal   Collection Time: 09/12/20 10:49 AM  Result Value Ref Range   Hemoglobin A1C 7.8 (A) 4.0 - 5.6 %   HbA1c POC (<> result, manual entry)     HbA1c, POC (prediabetic range)     HbA1c, POC (controlled diabetic range)      Diabetic Foot Exam: Diabetic Foot Exam - Simple   Simple Foot Form Visual Inspection See comments: Yes Sensation Testing Intact to touch and monofilament testing bilaterally: Yes Pulse Check Posterior Tibialis and Dorsalis pulse intact bilaterally: Yes Comments Bunion on both feet, some corn  formation, left bunion red and tender to touch       PHQ2/9: Depression screen [facility] 2/9 09/12/2020 05/09/2020 01/07/2020 09/10/2019 05/10/2019  Decreased Interest 0 0 0 0 0  Down, Depressed, Hopeless 0 0 0 0 0  PHQ - 2 Score 0 0 0 0 0  Altered sleeping - - 0 0 -  Tired, decreased energy - -  0 0 -  Change in appetite - - 0 0 -  Feeling bad or failure about yourself  - - 0 0 -  Trouble concentrating - - 0 0 -  Moving slowly or fidgety/restless - - 0 0 -  Suicidal thoughts - - 0 0 -  PHQ-9 Score - - 0 0 -  Difficult doing work/chores - - - - -    phq 9 is negative   Fall Risk: Fall Risk  09/12/2020 05/09/2020 01/07/2020 09/10/2019 05/10/2019  Falls in the past year? 0 1 0 0 0  Number falls in past yr: 0 0 0 0 0  Injury with Fall? 0 0 0 0 0  Risk for fall due to : - Impaired balance/gait;Impaired mobility - - -  Follow up - Falls prevention discussed - - -     Assessment & Plan  1. Dyslipidemia associated with type 2 diabetes mellitus (HCC)  - POCT HgB A1C  2. Need for immunization against influenza  - Flu Vaccine QUAD High Dose(Fluad)  3. Benign hypertension  - amLODipine (NORVASC) 5 MG tablet; Take 1 tablet (5 mg total) by mouth daily.  Dispense: 90 tablet; Refill: 1  4. Stage 3a chronic kidney disease (HCC)  - amLODipine (NORVASC) 5 MG tablet; Take 1 tablet (5 mg total) by mouth daily.  Dispense: 90 tablet; Refill: 1  5. Diabetes mellitus with proteinuric diabetic nephropathy (HCC)  - Dulaglutide (TRULICITY) 3 IR/6.7EL SOPN; Inject 3 mg as directed once a week.  Dispense: 12 mL; Refill: 1 - glucose blood (ACCU-CHEK AVIVA PLUS) test strip; Check fasting glucose once each morning, and check glucose as needed.  Dispense: 100 each; Refill: 12  6. Gastroesophageal reflux disease without esophagitis  - pantoprazole (PROTONIX) 40 MG tablet; Take 1 tablet (40 mg total) by mouth daily.  Dispense: 90 tablet; Refill: 1  7. Vitamin D deficiency  - Vitamin D, Ergocalciferol, (DRISDOL)  1.25 MG (50000 UNIT) CAPS capsule; TAKE 1 CAPSULE BY MOUTH WEEKLY  Dispense: 12 capsule; Refill: 1

## 2020-09-12 ENCOUNTER — Encounter: Payer: Self-pay | Admitting: Family Medicine

## 2020-09-12 ENCOUNTER — Ambulatory Visit (INDEPENDENT_AMBULATORY_CARE_PROVIDER_SITE_OTHER): Payer: Medicare HMO | Admitting: Family Medicine

## 2020-09-12 VITALS — BP 132/78 | HR 95 | Temp 98.1°F | Resp 16 | Ht 65.0 in | Wt 204.4 lb

## 2020-09-12 DIAGNOSIS — E1121 Type 2 diabetes mellitus with diabetic nephropathy: Secondary | ICD-10-CM

## 2020-09-12 DIAGNOSIS — Z23 Encounter for immunization: Secondary | ICD-10-CM

## 2020-09-12 DIAGNOSIS — N1831 Chronic kidney disease, stage 3a: Secondary | ICD-10-CM

## 2020-09-12 DIAGNOSIS — E1169 Type 2 diabetes mellitus with other specified complication: Secondary | ICD-10-CM

## 2020-09-12 DIAGNOSIS — I10 Essential (primary) hypertension: Secondary | ICD-10-CM | POA: Diagnosis not present

## 2020-09-12 DIAGNOSIS — K219 Gastro-esophageal reflux disease without esophagitis: Secondary | ICD-10-CM | POA: Diagnosis not present

## 2020-09-12 DIAGNOSIS — E559 Vitamin D deficiency, unspecified: Secondary | ICD-10-CM

## 2020-09-12 DIAGNOSIS — E785 Hyperlipidemia, unspecified: Secondary | ICD-10-CM

## 2020-09-12 LAB — POCT GLYCOSYLATED HEMOGLOBIN (HGB A1C): Hemoglobin A1C: 7.8 % — AB (ref 4.0–5.6)

## 2020-09-12 LAB — CHROMOGRANIN A: Chromogranin A (ng/mL): 178.3 ng/mL — ABNORMAL HIGH (ref 0.0–101.8)

## 2020-09-12 MED ORDER — AMLODIPINE BESYLATE 5 MG PO TABS: 5.0000 mg | ORAL_TABLET | Freq: Every day | ORAL | 1 refills | Status: DC

## 2020-09-12 MED ORDER — LEVOTHYROXINE SODIUM 50 MCG PO TABS
50.0000 ug | ORAL_TABLET | Freq: Every day | ORAL | 1 refills | Status: DC
Start: 2020-09-12 — End: 2021-03-26

## 2020-09-12 MED ORDER — TRULICITY 3 MG/0.5ML ~~LOC~~ SOAJ: 3.0000 mg | SUBCUTANEOUS | 1 refills | Status: DC

## 2020-09-12 MED ORDER — VITAMIN D (ERGOCALCIFEROL) 1.25 MG (50000 UNIT) PO CAPS: ORAL_CAPSULE | ORAL | 1 refills | Status: DC

## 2020-09-12 MED ORDER — ACCU-CHEK AVIVA PLUS VI STRP: ORAL_STRIP | 12 refills | Status: DC

## 2020-09-12 MED ORDER — PANTOPRAZOLE SODIUM 40 MG PO TBEC: 40.0000 mg | DELAYED_RELEASE_TABLET | Freq: Every day | ORAL | 1 refills | Status: DC

## 2020-09-14 ENCOUNTER — Inpatient Hospital Stay (HOSPITAL_BASED_OUTPATIENT_CLINIC_OR_DEPARTMENT_OTHER): Payer: Medicare HMO | Admitting: Oncology

## 2020-09-14 ENCOUNTER — Encounter: Payer: Self-pay | Admitting: Oncology

## 2020-09-14 ENCOUNTER — Other Ambulatory Visit: Payer: Self-pay

## 2020-09-14 VITALS — BP 136/67 | HR 78 | Temp 98.1°F | Resp 16 | Ht 65.0 in | Wt 205.0 lb

## 2020-09-14 DIAGNOSIS — C7B00 Secondary carcinoid tumors, unspecified site: Secondary | ICD-10-CM | POA: Diagnosis not present

## 2020-09-14 DIAGNOSIS — E785 Hyperlipidemia, unspecified: Secondary | ICD-10-CM | POA: Diagnosis not present

## 2020-09-14 DIAGNOSIS — C7B8 Other secondary neuroendocrine tumors: Secondary | ICD-10-CM | POA: Diagnosis not present

## 2020-09-14 DIAGNOSIS — I272 Pulmonary hypertension, unspecified: Secondary | ICD-10-CM | POA: Diagnosis not present

## 2020-09-14 DIAGNOSIS — D649 Anemia, unspecified: Secondary | ICD-10-CM

## 2020-09-14 DIAGNOSIS — E119 Type 2 diabetes mellitus without complications: Secondary | ICD-10-CM | POA: Diagnosis not present

## 2020-09-14 DIAGNOSIS — N189 Chronic kidney disease, unspecified: Secondary | ICD-10-CM | POA: Diagnosis not present

## 2020-09-14 DIAGNOSIS — R590 Localized enlarged lymph nodes: Secondary | ICD-10-CM | POA: Diagnosis not present

## 2020-09-14 DIAGNOSIS — M1990 Unspecified osteoarthritis, unspecified site: Secondary | ICD-10-CM | POA: Diagnosis not present

## 2020-09-14 DIAGNOSIS — E039 Hypothyroidism, unspecified: Secondary | ICD-10-CM | POA: Diagnosis not present

## 2020-09-14 DIAGNOSIS — K219 Gastro-esophageal reflux disease without esophagitis: Secondary | ICD-10-CM | POA: Diagnosis not present

## 2020-09-14 DIAGNOSIS — I10 Essential (primary) hypertension: Secondary | ICD-10-CM | POA: Diagnosis not present

## 2020-09-14 NOTE — Progress Notes (Signed)
Here to get ct results. Has some gout flare ups but not at this time

## 2020-09-15 NOTE — Progress Notes (Signed)
Hematology/Oncology Consult note [facility]  [phone] [phone]  Patient Care Team: Steele Sizer, MD as PCP - General (Family Medicine) Wellington Hampshire, MD as PCP - Cardiology (Cardiology) Sindy Guadeloupe, MD as Consulting Physician (Oncology) Neldon Labella, RN as Case Manager   Name of the patient: Susan Bowen  [id number]  [birth year]   Date of visit: 09/15/20  Diagnosis- intra abdominal adenopathy- metastatic carcinoid on surveillance  Chief complaint/ Reason for visit-discuss CT scan results  Heme/Onc history: Patient is a 86 year old female who underwent CT renal stone study for flank pain. CT scan showed pathologically enlarged lymph nodes in the right lower quadrant measuring up to 2.6 cm. Findings concerning for lymphoma or malignant process. Patient's past medical history significant for pulmonary hypertension, CKD, hypothyroidism among other medical problems. She did not have any B symptoms. This was followed by a PET CT scan Which showed 3 prominent soft tissue nodule/lymph nodes one in the mesentery at 2.1 cm with an SUV of 2.8. Adjacent soft tissue nodule/lymph node in the right iliac fossa measuring 3.1 cm and SUV of 2.8. Predominantly fat attenuation mesenteric nodule in the left lower quadrant measuring 2 cm without any significant FDG uptake  She remains on surveillance and has not started any somatostatin therapy yet  Interval history-patient reports doing well for her age.  Reports mild chronic fatigue.  She used to have intermittent diarrhea which she says has improved presently.  ECOG PS- 1 Pain scale- 0   Review of systems- Review of Systems  Constitutional: Positive for malaise/fatigue. Negative for chills, fever and weight loss.  HENT: Negative for congestion, ear discharge and nosebleeds.   Eyes: Negative for blurred vision.  Respiratory: Negative for cough, hemoptysis, sputum production,  shortness of breath and wheezing.   Cardiovascular: Negative for chest pain, palpitations, orthopnea and claudication.  Gastrointestinal: Negative for abdominal pain, blood in stool, constipation, diarrhea, heartburn, melena, nausea and vomiting.  Genitourinary: Negative for dysuria, flank pain, frequency, hematuria and urgency.  Musculoskeletal: Negative for back pain, joint pain and myalgias.  Skin: Negative for rash.  Neurological: Negative for dizziness, tingling, focal weakness, seizures, weakness and headaches.  Endo/Heme/Allergies: Does not bruise/bleed easily.  Psychiatric/Behavioral: Negative for depression and suicidal ideas. The patient does not have insomnia.       Allergies  Allergen Reactions  . Augmentin [Amoxicillin-Pot Clavulanate] Itching    Has patient had a PCN reaction causing immediate rash, facial/tongue/throat swelling, SOB or lightheadedness with hypotension: No Has patient had a PCN reaction causing severe rash involving mucus membranes or skin necrosis: No Has patient had a PCN reaction that required hospitalization: No Has patient had a PCN reaction occurring within the last 10 years: No If all of the above answers are "NO", then may proceed with Cephalosporin use.      Past Medical History:  Diagnosis Date  . Allergy   . Back spasm   . Bunion   . Carpal tunnel syndrome   . Cataracta   . Diabetes mellitus without complication ([place])   . Generalized osteoarthritis   . GERD (gastroesophageal reflux disease)   . Gout   . Hemorrhoids without complication   . Hyperlipidemia   . Hypertension   . Hypothyroidism   . Impingement syndrome of right shoulder   . Insomnia   . Lipoma   . Lung nodule   . Metastatic carcinoid tumor ([place]) 03/2016  . Neuritis or radiculitis due to rupture of lumbar intervertebral disc   .  Obesity   . Osteoporosis   . PAF (paroxysmal atrial fibrillation) ([place])    a. 03/2018 AF w/ RVR-->converted spont.  CHA2DS2VASc =  7-->Eliquis.  . Proteinuria   . Rotator cuff tear   . Systolic murmur    a. 02/1659 Echo: EF 60-65%, no rwma, Gr1 DD, mild MR, mildly dil LA. Nl RV fxn. PASP 74mHg.  .Marland KitchenTIA (transient ischemic attack)   . TIA (transient ischemic attack) 1998   small TIA.  no residual effects  . Tinnitus of both ears   . Unspecified glaucoma(365.9)   . Vitamin D deficiency   . Wedge compression fracture of t11-T12 vertebra, sequela      Past Surgical History:  Procedure Laterality Date  . ABDOMINAL HYSTERECTOMY  1986  . APPENDECTOMY    . BREAST SURGERY Bilateral 1983   biopsy of each side, negative  . CARPAL TUNNEL RELEASE    . CATARACT EXTRACTION W/PHACO Left 09/03/2017   Procedure: CATARACT EXTRACTION PHACO AND INTRAOCULAR LENS PLACEMENT (IOC)-LEFT DIABETIC;  Surgeon: PBirder Robson MD;  Location: [facility] ORS;  Service: Ophthalmology;  Laterality: Left;  [place]:10.2 AP% 16.7 CDE 11.71 Fluid Pack Lot # [id number] . CATARACT EXTRACTION W/PHACO Right 09/30/2017   Procedure: CATARACT EXTRACTION PHACO AND INTRAOCULAR LENS PLACEMENT (IOC);  Surgeon: PBirder Robson MD;  Location: [facility] ORS;  Service: Ophthalmology;  Laterality: Right;  [place]:35.1 AP% 13.1 CDE 12.44 Fluid pack Lot # [id number]  . COLON SURGERY    . EYE SURGERY Left 09/03/2017   Cataract extraction  . FEMUR FRACTURE SURGERY Left   . FRACTURE SURGERY  2001   left femur  . HEMORROIDECTOMY    . polyp removed from vocal cord      Social History   Socioeconomic History  . Marital status: Divorced    Spouse name: Not on file  . Number of children: 6  . Years of education: Not on file  . Highest education level: 10th grade  Occupational History  . Not on file  Tobacco Use  . Smoking status: Never Smoker  . Smokeless tobacco: Never Used  Vaping Use  . Vaping Use: Never used  Substance and Sexual Activity  . Alcohol use: No    Alcohol/week: 0.0 standard drinks  . Drug use: No  . Sexual activity: Not Currently  Other Topics  Concern  . Not on file  Social History Narrative   Pt lives with her son; independent ADL's, no longer drives   Social Determinants of Health   Financial Resource Strain: Low Risk   . Difficulty of Paying Living Expenses: Not very hard  Food Insecurity: No Food Insecurity  . Worried About RCharity fundraiserin the Last Year: Never true  . Ran Out of Food in the Last Year: Never true  Transportation Needs: No Transportation Needs  . Lack of Transportation (Medical): No  . Lack of Transportation (Non-Medical): No  Physical Activity: Insufficiently Active  . Days of Exercise per Week: 7 days  . Minutes of Exercise per Session: 10 min  Stress: No Stress Concern Present  . Feeling of Stress : Not at all  Social Connections: Moderately Integrated  . Frequency of Communication with Friends and Family: More than three times a week  . Frequency of Social Gatherings with Friends and Family: Three times a week  . Attends Religious Services: More than 4 times per year  . Active Member of Clubs or Organizations: Yes  . Attends CArchivistMeetings: 1 to 4  times per year  . Marital Status: Divorced  Human resources officer Violence: Not At Risk  . Fear of Current or Ex-Partner: No  . Emotionally Abused: No  . Physically Abused: No  . Sexually Abused: No    Family History  Problem Relation Age of Onset  . Diabetes Mother   . Hypertension Mother   . Dementia Mother   . Hypertension Son      Current Outpatient Medications:  .  acetaminophen-codeine (TYLENOL #3) 300-30 MG tablet, Take 1 tablet by mouth every 6 (six) hours as needed for moderate pain. For chronic pain , OA okay to give 30 not 7, Disp: 30 tablet, Rfl: 0 .  allopurinol (ZYLOPRIM) 100 MG tablet, TAKE 1 TABLET BY MOUTH EVERY DAY, Disp: 90 tablet, Rfl: 1 .  amLODipine (NORVASC) 5 MG tablet, Take 1 tablet (5 mg total) by mouth daily., Disp: 90 tablet, Rfl: 1 .  Blood Glucose Monitoring Suppl (ACCU-CHEK AVIVA PLUS) w/Device  KIT, 1 kit by Does not apply route 3 (three) times daily., Disp: 1 kit, Rfl: 0 .  carvedilol (COREG) 25 MG tablet, TAKE 1 TABLET (25 MG TOTAL) BY MOUTH 2 (TWO) TIMES DAILY. NEW DOSE, Disp: 180 tablet, Rfl: 1 .  COMBIGAN 0.2-0.5 % ophthalmic solution, Place 1 drop into both eyes 2 (two) times daily., Disp: , Rfl:  .  Cyanocobalamin (B-12) 1000 MCG TABS, Take 1,000 mcg daily by mouth., Disp: , Rfl:  .  diclofenac Sodium (VOLTAREN) 1 % GEL, Apply 2 g topically 3 (three) times daily as needed. , Disp: , Rfl:  .  Dulaglutide (TRULICITY) 3 SE/3.9RV SOPN, Inject 3 mg as directed once a week., Disp: 12 mL, Rfl: 1 .  ELIQUIS 5 MG TABS tablet, TAKE 1 TABLET BY MOUTH TWICE A DAY, Disp: 180 tablet, Rfl: 1 .  glucose blood (ACCU-CHEK AVIVA PLUS) test strip, Check fasting glucose once each morning, and check glucose as needed., Disp: 100 each, Rfl: 12 .  JARDIANCE 25 MG TABS tablet, TAKE 25 MG BY MOUTH DAILY., Disp: 90 tablet, Rfl: 1 .  Lancets (ACCU-CHEK MULTICLIX) lancets, Use as instructed, Disp: 204 each, Rfl: 12 .  levothyroxine (SYNTHROID) 50 MCG tablet, Take 1 tablet (50 mcg total) by mouth daily before breakfast., Disp: 90 tablet, Rfl: 1 .  loratadine (CLARITIN) 10 MG tablet, TAKE 1 TABLET BY MOUTH EVERY DAY, Disp: 90 tablet, Rfl: 2 .  metFORMIN (GLUCOPHAGE) 850 MG tablet, TAKE 1 TABLET (850 MG TOTAL) BY MOUTH 2 (TWO) TIMES DAILY., Disp: 180 tablet, Rfl: 1 .  olmesartan (BENICAR) 40 MG tablet, Take 1 tablet (40 mg total) by mouth daily., Disp: 90 tablet, Rfl: 1 .  pantoprazole (PROTONIX) 40 MG tablet, Take 1 tablet (40 mg total) by mouth daily., Disp: 90 tablet, Rfl: 1 .  pravastatin (PRAVACHOL) 40 MG tablet, TAKE 1 TABLET BY MOUTH EVERY DAY, Disp: 90 tablet, Rfl: 1 .  Travoprost, BAK Free, (TRAVATAN) 0.004 % SOLN ophthalmic solution, Place 1 drop into both eyes at bedtime., Disp: , Rfl:  .  Vitamin D, Ergocalciferol, (DRISDOL) 1.25 MG (50000 UNIT) CAPS capsule, TAKE 1 CAPSULE BY MOUTH WEEKLY, Disp: 12  capsule, Rfl: 1  Physical exam:  Vitals:   09/14/20 0953  BP: 136/67  Pulse: 78  Resp: 16  Temp: 98.1 F (36.7 C)  TempSrc: Oral  Weight: 205 lb (93 kg)  Height: _0  (1.651 m)   Physical Exam Constitutional:      General: She is not in acute distress. Cardiovascular:  Rate and Rhythm: Normal rate and regular rhythm.     Heart sounds: Normal heart sounds.  Pulmonary:     Effort: Pulmonary effort is normal.  Abdominal:     General: Bowel sounds are normal.     Palpations: Abdomen is soft.  Skin:    General: Skin is warm and dry.  Neurological:     Mental Status: She is alert and oriented to person, place, and time.      CMP Latest Ref Rng & Units 09/08/2020  Glucose 70 - 99 mg/dL 144(H)  BUN 8 - 23 mg/dL 8  Creatinine 0.44 - 1.00 mg/dL 0.95  Sodium 135 - 145 mmol/L 141  Potassium 3.5 - 5.1 mmol/L 3.8  Chloride 98 - 111 mmol/L 108  CO2 22 - 32 mmol/L 26  Calcium 8.9 - 10.3 mg/dL 9.1  Total Protein 6.5 - 8.1 g/dL 6.6  Total Bilirubin 0.3 - 1.2 mg/dL 0.4  Alkaline Phos 38 - 126 U/L 51  AST 15 - 41 U/L 13(L)  ALT 0 - 44 U/L 12   CBC Latest Ref Rng & Units 09/08/2020  WBC 4.0 - 10.5 K/uL 11.6(H)  Hemoglobin 12.0 - 15.0 g/dL 10.2(L)  Hematocrit 36 - 46 % 33.3(L)  Platelets 150 - 400 K/uL 315    No images are attached to the encounter.  CT ABDOMEN PELVIS W CONTRAST  Result Date: 09/11/2020 CLINICAL DATA:  86 year old female with history of malignant carcinoid tumor of the small intestine diagnosed in May 2017. EXAM: CT ABDOMEN AND PELVIS WITH CONTRAST TECHNIQUE: Multidetector CT imaging of the abdomen and pelvis was performed using the standard protocol following bolus administration of intravenous contrast. CONTRAST:  67m OMNIPAQUE IOHEXOL 300 MG/ML  SOLN COMPARISON:  CT the abdomen and pelvis 03/07/2020. FINDINGS: Lower chest: Aortic atherosclerosis, as well as calcified atherosclerotic plaque in the left main and left anterior descending coronary arteries. Mild  linear scarring in the lower lobes of the lungs bilaterally. Hepatobiliary: No suspicious cystic or solid hepatic lesions. No intra or extrahepatic biliary ductal dilatation. Gallbladder is normal in appearance. Pancreas: No pancreatic mass. No pancreatic ductal dilatation. No pancreatic or peripancreatic fluid collections or inflammatory changes. Spleen: Unremarkable. Adrenals/Urinary Tract: Bilateral kidneys and adrenal glands are normal in appearance. No hydroureteronephrosis. Urinary bladder is normal in appearance. Stomach/Bowel: Normal appearance of the stomach. No pathologic dilatation of small bowel or colon. The appendix is not confidently identified and may be surgically absent. Regardless, there are no inflammatory changes noted adjacent to the cecum to suggest the presence of an acute appendicitis at this time. Vascular/Lymphatic: Aortic atherosclerosis, without evidence of aneurysm or dissection in the abdominal or pelvic vasculature. Enlarged ileocolic lymph nodes again noted measuring 2.1 x 2.8 cm (axial image 60 of series 2) and 2.7 x 3.6 cm (axial image 59 of series 2), stable compared to the prior examination. Reproductive: Status post hysterectomy. Ovaries are unremarkable in appearance. Other: No significant volume of ascites.  No pneumoperitoneum. Musculoskeletal: There are no aggressive appearing lytic or blastic lesions noted in the visualized portions of the skeleton. IMPRESSION: 1. Stable ileocolic mesentery lymphadenopathy. No other new sites of metastatic disease noted elsewhere in the abdomen or pelvis. 2. Aortic atherosclerosis, in addition to left main and left anterior descending coronary artery disease. 3. Additional incidental findings, as above. Electronically Signed   By: DVinnie LangtonM.D.   On: 09/11/2020 13:08     Assessment and plan- Patient is a 86y.o. female with history of metastatic small bowel  carcinoid currently under surveillance here for routine follow-up  1.   Metastatic carcinoid: This has not been biopsy-proven.  However this was intensely hypermetabolic on dotatate PET scan a presumptive diagnosis of metastatic carcinoid was made based on her intra-abdominal adenopathy.This is remained stable over the last 1 year.  She therefore does not require any treatment for it at this time.  Given her age and stability of findings I am inclined to repeat her CT in 1 years time.  If she has any worsening abdominal pain diarrhea or palpitations in the interim she will let us know that would prompt [place] to do imaging sooner.  2.  Normocytic anemia: Patient had her blood work checked On 09/08/2020 which showed a white count of 11.6, H&H of 10.2/23.3 with an MCV of 82.4 and a platelet count of 315.  Typically patient's hemoglobin stays around 11-12 and is slowly drifted down to 10.  I will repeated in 2 month's time and also check ferritin and iron studies B12 and folate TSH and reticulocyte count at that time followed by a video visit Visit Diagnosis 1. Normocytic anemia   2. Metastatic carcinoid tumor [facility])      Dr. Randa Evens, MD, MPH [facility] at [facility] [phone] 09/15/2020 9:53 AM

## 2020-10-04 NOTE — Progress Notes (Signed)
prednis   Office Visit    Patient Name: Susan Bowen Date of Encounter: 10/10/2020  Primary Care Provider:  Steele Sizer, MD Primary Cardiologist:  Kathlyn Sacramento, MD  Chief Complaint    Chief Complaint  Patient presents with  . other    2-3 month follow up/meds reviewed by the pt. verbally/ Pt. c/o fluttering in chest at times.     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  Past Medical History    Past Medical History:  Diagnosis Date  . Allergy   . Back spasm   . Bunion   . Carpal tunnel syndrome   . Cataracta   . Diabetes mellitus without complication ([place])   . Generalized osteoarthritis   . GERD (gastroesophageal reflux disease)   . Gout   . Hemorrhoids without complication   . Hyperlipidemia   . Hypertension   . Hypothyroidism   . Impingement syndrome of right shoulder   . Insomnia   . Lipoma   . Lung nodule   . Metastatic carcinoid tumor ([place]) 03/2016  . Neuritis or radiculitis due to rupture of lumbar intervertebral disc   . Obesity   . Osteoporosis   . PAF (paroxysmal atrial fibrillation) ([place])    a. 03/2018 AF w/ RVR-->converted spont.  CHA2DS2VASc = 7-->Eliquis.  . Proteinuria   . Rotator cuff tear   . Systolic murmur    a. 04/7590 Echo: EF 60-65%, no rwma, Gr1 DD, mild MR, mildly dil LA. Nl RV fxn. PASP 48mHg.  .Marland KitchenTIA (transient ischemic attack)   . TIA (transient ischemic attack) 1998   small TIA.  no residual effects  . Tinnitus of both ears   . Unspecified glaucoma(365.9)   . Vitamin D deficiency   . Wedge  compression fracture of t11-T12 vertebra, sequela    Past Surgical History:  Procedure Laterality Date  . ABDOMINAL HYSTERECTOMY  1986  . APPENDECTOMY    . BREAST SURGERY Bilateral 1983   biopsy of each side, negative  . CARPAL TUNNEL RELEASE    . CATARACT EXTRACTION W/PHACO Left 09/03/2017   Procedure: CATARACT EXTRACTION PHACO AND INTRAOCULAR LENS PLACEMENT (IOC)-LEFT DIABETIC;  Surgeon: PBirder Robson MD;  Location: [facility] ORS;  Service: Ophthalmology;  Laterality: Left;  [place]:10.2 AP% 16.7 CDE 11.71 Fluid Pack Lot # [id number] . CATARACT EXTRACTION W/PHACO Right 09/30/2017   Procedure: CATARACT EXTRACTION PHACO AND INTRAOCULAR LENS PLACEMENT (IOC);  Surgeon: PBirder Robson MD;  Location: [facility] ORS;  Service: Ophthalmology;  Laterality: Right;  [place]:35.1 AP% 13.1 CDE 12.44 Fluid pack Lot # [id number]  . COLON SURGERY    . EYE SURGERY Left 09/03/2017   Cataract extraction  . FEMUR FRACTURE SURGERY Left   . FRACTURE SURGERY  2001   left femur  . HEMORROIDECTOMY    . polyp removed from vocal cord      Allergies  Allergies  Allergen Reactions  . Augmentin [Amoxicillin-Pot Clavulanate] Itching    Has patient had a PCN reaction causing immediate rash, facial/tongue/throat swelling, SOB or lightheadedness with hypotension: No Has patient had a PCN reaction causing severe rash involving mucus membranes or skin necrosis: No Has patient  had a PCN reaction that required hospitalization: No Has patient had a PCN reaction occurring within the last 10 years: No If all of the above answers are "NO", then may proceed with Cephalosporin use.     History of Present Illness    Susan GOGUEN is a 86 y.o. female with PMH as above.    She has a history of paroxysmal atrial fibrillation diagnosed in 2019.  Echo showed normal LVSF with G1DD.  She underwent outpatient Lexiscan Myoview without evidence of ischemia and normal EF.    She has been followed by oncology for carcinoid tumor  with METS (no confirmation bx per oncology note) 2/2 intensely hypermetabolic PET scan and imaging showing intra-abdominal adenopathy.  She also has h/o lung nodule. She has remained on surveillance for her carcinoid tumor with METS / abdominal adenopathy for the last 1 year and stable of her most recent 09/15/20 visit. In terms of sx, she has reported chronic intermittent palpitations ()with known dx of PAF) and intermittent diarrhea as of her most recent 09/2020 visit.    She follows with pulmonology for gout with HCTZ discontinued and subsequent improvement in flares.  She was seen 07/2020 in clinic with tachypalpitations, fatigue, DOE with walking, intermittent dizziness, and vision changes attributed to macular degeneration. She noted increased episodes of fluttering in her chest. She had tachypalpitations whenever laying down or with increased activity. She experienced DOE with walking, attributed to deconditioning. She had done extra work around the house with sx of fatigue and intermittent dizziness after and wondered if this was contributing to her sx.  Dizziness described as orthostatic in nature and occurring mainly with position changes. She noted ongoing issues with sleeping, often waking up at 3:30 AM for at least 1 hr before going back to sleep.  She had experienced recent exacerbations of her gout with prednisone injections and subsequent discontinuation of her HCTZ and start of amlodipine 2.64m.  Home SBP 130s to 150s and DBP 60s to 90s.  She had HA with elevated BP.  She noted chronic vision changes, attributed to her macular degeneration, rather than her BP.  She had ongoing intermittent diarrhea, though not within the prior 2 weeks. It was recommended she discontinue diclofenac cream on [place] Benicar was increased to 420mdaily for BP support. When seen 08/2020, however, she had not yet started increased dose ARB 2/2 confusion regarding recent telephone calls from the office. Home & clinic BP  was elevated with more frequent headache and dizziness. DOE was unchanged, though tachypalpitations had improved in frequency /intensity. Her gout was well controlled. She wondered if recent L knee pain was the reason for her BP.  Recommendation was for start of Benicar 40 mg daily. Amlodipine was then subsequently increased to 32m532maily after receipt of MyChart message with ongoing elevated SBP 150s-180s with request she monitor her wt and volume status going forward and tentative plan to reassess need for addition of nonthiazide diuretic at RTC.   Today, 10/10/2020, she returns to clinic and notes that she is doing very well overall.  She continues to note intermittent fluttering in her chest, though significantly improved from previous visits.  She has not had a headache for 2 weeks.  She reports BP at home is always higher than that of clinic BP with SBP usually 150s to 180s.  Recommendation was for reassessment of BP cuff.  At times, she feels dizzy with change in position, and also notes a whooshing sound in her ears.  This only occurs when changing positions.  She continues to note irregular sleeping patterns, always waking at 2-3 AM.  Over the last 3 weeks, she reports diarrhea twice, which is a usual occurrence for her.  She continues to note improvement in gout occurrence.  She denies any chest pain or signs/symptoms of volume overload.  No signs or symptoms of bleeding.  She reports medication compliance.  She is tolerating her increased dose amlodipine well.  Home Medications    Prior to Admission medications   Medication Sig Start Date End Date Taking? Authorizing Provider  acetaminophen-codeine (TYLENOL #3) 300-30 MG tablet Take 1 tablet by mouth every 6 (six) hours as needed for moderate pain. For chronic pain , OA okay to give 30 not 7 07/19/20  Yes Sowles, Drue Stager, MD  allopurinol (ZYLOPRIM) 100 MG tablet Take 1 tablet (100 mg total) by mouth daily. 06/06/20  Yes Edrick Kins, DPM   amLODipine (NORVASC) 2.5 MG tablet Take 1 tablet (2.5 mg total) by mouth daily. 05/09/20  Yes Sowles, Drue Stager, MD  Blood Glucose Monitoring Suppl (ACCU-CHEK AVIVA PLUS) w/Device KIT 1 kit by Does not apply route 3 (three) times daily. 01/09/17  Yes Sowles, Drue Stager, MD  carvedilol (COREG) 25 MG tablet Take 1 tablet (25 mg total) by mouth 2 (two) times daily. New dose 09/10/19  Yes Sowles, Drue Stager, MD  COMBIGAN 0.2-0.5 % ophthalmic solution Place 1 drop into both eyes 2 (two) times daily.   Yes [provider]  Cyanocobalamin (B-12) 1000 MCG TABS Take 1,000 mcg daily by mouth.   Yes [provider]  diclofenac (VOLTAREN) 75 MG EC tablet Take 1 tablet (75 mg total) by mouth 2 (two) times daily. 05/09/20  Yes Sowles, Drue Stager, MD  diclofenac Sodium (VOLTAREN) 1 % GEL Apply topically.   Yes [provider]  Dulaglutide (TRULICITY) 3 AS/5.0NL SOPN Inject 0.5 mLs (3 mg total) as directed once a week. 05/09/20  Yes Sowles, Drue Stager, MD  ELIQUIS 5 MG TABS tablet TAKE 1 TABLET BY MOUTH TWICE A DAY 05/02/20  Yes Wellington Hampshire, MD  empagliflozin (JARDIANCE) 25 MG TABS tablet Take 25 mg by mouth daily. 01/07/20  Yes Sowles, Drue Stager, MD  glucose blood (ACCU-CHEK AVIVA PLUS) test strip Check fasting glucose once each morning, and check glucose as needed. 06/26/18  Yes Hubbard Hartshorn, FNP  Lancets (ACCU-CHEK MULTICLIX) lancets Use as instructed 01/09/17  Yes Sowles, Drue Stager, MD  levothyroxine (SYNTHROID) 50 MCG tablet Take 1 tablet (50 mcg total) by mouth daily before breakfast. 05/10/19  Yes Sowles, Drue Stager, MD  loratadine (CLARITIN) 10 MG tablet TAKE 1 TABLET BY MOUTH EVERY DAY 03/28/20  Yes Sowles, Drue Stager, MD  metFORMIN (GLUCOPHAGE) 850 MG tablet TAKE 1 TABLET (850 MG TOTAL) BY MOUTH 2 (TWO) TIMES DAILY. 05/02/20  Yes Sowles, Drue Stager, MD  olmesartan (BENICAR) 20 MG tablet TAKE 1 TABLET BY MOUTH EVERY DAY 06/08/20  Yes Sowles, Drue Stager, MD  pantoprazole (PROTONIX) 40 MG tablet Take 1 tablet (40 mg total)  by mouth daily. 05/09/20  Yes Sowles, Drue Stager, MD  pravastatin (PRAVACHOL) 40 MG tablet TAKE 1 TABLET BY MOUTH EVERY DAY 03/28/20  Yes Sowles, Drue Stager, MD  Travoprost, BAK Free, (TRAVATAN) 0.004 % SOLN ophthalmic solution Place 1 drop into both eyes at bedtime.   Yes [provider]  Vitamin D, Ergocalciferol, (DRISDOL) 1.25 MG (50000 UNIT) CAPS capsule TAKE 1 CAPSULE BY MOUTH WEEKLY 05/02/20  Yes Steele Sizer, MD    Review of Systems    She denies  chest pain, pnd, orthopnea, n, syncope, edema, weight gain, or early satiety.  She reports palpitations that are improving and some dizziness/whooshing in her ears that only occurs with position changes.  She reports improved dyspnea.  She notes improved HA, occurring less frequently.  She continues to note intermittent diarrhea.  All other systems reviewed and are otherwise negative except as noted above.  Physical Exam    Vital Signs. BP 130/72 (BP Location: Left Arm, Patient Position: Sitting, Cuff Size: Normal)   Pulse 75   Ht _0  (1.626 m)   Wt 201 lb 8 oz (91.4 kg)   LMP  (LMP Unknown)   SpO2 98%   BMI 34.59 kg/m  General: Well developed, well nourished, in no acute distress. Head: Normocephalic, atraumatic, sclera non-icteric, no xanthomas, nares are without discharge. Neck: JVP not elevated.    Lungs: Clear bilaterally to auscultation without wheezes, rales, or rhonchi. Breathing is unlabored. Heart: RRR S1 S2, 1/6 systolic murmur RUSB . No rubs, or gallops.  Abdomen: Soft, non-tender, non-distended with normoactive bowel sounds. No rebound/guarding. Extremities: No clubbing or cyanosis.  Minimal bilateral edema. Neuro: Alert and oriented X 3. Moves all extremities spontaneously. Psych:  Responds to questions appropriately with a normal affect.  Accessory Clinical Findings    ECG personally reviewed by me today - NSR with marked sinus arrhythmia, 75 bpm, QTC 404 ms- no acute changes.  VITALS Reviewed today   Temp  Readings from Last 3 Encounters:  10/06/20 98 F (36.7 C) (Oral)  09/14/20 98.1 F (36.7 C) (Oral)  09/12/20 98.1 F (36.7 C) (Oral)   BP Readings from Last 3 Encounters:  10/10/20 130/72  10/06/20 134/72  09/14/20 136/67   Pulse Readings from Last 3 Encounters:  10/10/20 75  10/06/20 94  09/14/20 78    Wt Readings from Last 3 Encounters:  10/10/20 201 lb 8 oz (91.4 kg)  10/06/20 199 lb 11.2 oz (90.6 kg)  09/14/20 205 lb (93 kg)     LABS  reviewed today   Lab Results  Component Value Date   WBC 11.6 (H) 09/08/2020   HGB 10.2 (L) 09/08/2020   HCT 33.3 (L) 09/08/2020   MCV 82.4 09/08/2020   PLT 315 09/08/2020   Lab Results  Component Value Date   CREATININE 0.95 09/08/2020   BUN 8 09/08/2020   NA 141 09/08/2020   K 3.8 09/08/2020   CL 108 09/08/2020   CO2 26 09/08/2020   Lab Results  Component Value Date   ALT 12 09/08/2020   AST 13 (L) 09/08/2020   ALKPHOS 51 09/08/2020   BILITOT 0.4 09/08/2020   Lab Results  Component Value Date   CHOL 144 01/07/2020   HDL 49 (L) 01/07/2020   LDLCALC 73 01/07/2020   TRIG 141 01/07/2020   CHOLHDL 2.9 01/07/2020    Lab Results  Component Value Date   HGBA1C 7.8 (A) 09/12/2020   Lab Results  Component Value Date   TSH 3.470 07/27/2020     STUDIES/PROCEDURES reviewed today   Carotids 10/10/20  Summary:  Right Carotid: There was no evidence of thrombus, dissection,  atherosclerotic         plaque or stenosis in the cervical carotid system.   Left Carotid: There was no evidence of thrombus, dissection,  atherosclerotic        plaque or stenosis in the cervical carotid system.   Vertebrals: Bilateral vertebral arteries demonstrate antegrade flow.  Subclavians: Normal flow hemodynamics were seen in bilateral subclavian  arteries.   Echo 03/24/2018 - Left ventricle: The cavity size was normal. Systolic function was  normal. The estimated ejection fraction was in the range of  60%  to 65%. Wall motion was normal; there were no regional wall  motion abnormalities. Doppler parameters are consistent with  abnormal left ventricular relaxation (grade 1 diastolic  dysfunction).  - Mitral valve: There was mild regurgitation.  - Left atrium: The atrium was mildly dilated.  - Right ventricle: Systolic function was normal.  - Pulmonary arteries: Systolic pressure was mild to moderately  elevated. PA peak pressure: 47 mm Hg (S).   Assessment & Plan   Paroxysmal atrial fibrillation Tachypalpitations --NSR today. Reports improved fluttering, stable DOE.  Continue current medications including carvedilol 18m BID for rate control and anticoagulation with Eliquis 5 mg twice daily for now. Could increase to Coreg 37.518mBID if needed for control of tachypalpitations, though given her improvement reported today, will defer for now. Diclofenac cream contraindicated on anticoagulation due to increased risk of bleeding as discussed by PCP.  She denies any signs or symptoms of bleeding today.     Essential hypertension --She reports elevated home pressures at home when compared with clinic BP. BP today significantly improved from previous clinic visits. She has improved frequency of headaches and tachypalpitations with improved BP. Continue current medications, including amlodipine, Benicar, and carvedilol.  HCTZ previously discontinued given exacerbation of gout.  Will increase to amlodipine 10 mg today for further BP support. Continue to monitor home BP.    Atherosclerosis by CT --Seen on imagining. Continue current statin.  Glycemic control recommended.  On Eliquis in place of ASA. BP and HR control.  Future considerations could include updating echo.  Will defer for now, given improvement in symptoms as above.  HLD -Continue current statin.  Most recent LDL well controlled at 61.  DM2 -Recommend ongoing glycemic control per PCP for risk factor modification.  Further  monitoring and management per PCP/endocrinology.  Pulmonary nodule --Continue to follow with pulmonology / oncology and obtain imaging to monitor as directed.   Consider as contributing to her dyspnea with exertion.  Carcinoid tumor or metastasis  --She has been following with oncology since diagnosed with carcinoid tumor with metastasis and undergoing watchful waiting.  Symptoms of carcinoid syndrome include tachypalpitations and diarrhea; therefore, these sx were carefully documented today to ensure appropriate monitoring in this respect.    Goiter s/p thyroid ablation --Given her tachypalpitations, TSH rechecked at previous visits and WNL.  She is followed by endocrinology and on levothyroxine 50 MCG daily.  Further management and monitoring per PCP/endocrinology.  BMI --Ongoing activity and healthy diet recommended for risk factor modification.  CKDIII --Glycemic and BP control recommended to prevent worsening CKD.  If creatinine is elevated above 1.5, we will reduce Eliquis to 2.5 mg twice daily as above.  Gout flares --Per podiatry and PCP. HCTZ discontinued at previous visit.  Could consider nonthiazide diuretic if needed in the future.  Medication changes: Increase to amlodipine 10 mg Labs ordered: None Studies / Imaging ordered: None Future considerations: Update echo. Disposition: Return to clinic in 6 months or sooner if needed  JaArvil ChacoPA-C 10/10/2020

## 2020-10-05 ENCOUNTER — Other Ambulatory Visit: Payer: Self-pay | Admitting: Physician Assistant

## 2020-10-05 DIAGNOSIS — R0989 Other specified symptoms and signs involving the circulatory and respiratory systems: Secondary | ICD-10-CM

## 2020-10-06 ENCOUNTER — Ambulatory Visit (INDEPENDENT_AMBULATORY_CARE_PROVIDER_SITE_OTHER): Payer: Medicare HMO | Admitting: Family Medicine

## 2020-10-06 ENCOUNTER — Other Ambulatory Visit: Payer: Self-pay

## 2020-10-06 ENCOUNTER — Encounter: Payer: Self-pay | Admitting: Family Medicine

## 2020-10-06 VITALS — BP 134/72 | HR 94 | Temp 98.0°F | Resp 16 | Ht 66.0 in | Wt 199.7 lb

## 2020-10-06 DIAGNOSIS — E1165 Type 2 diabetes mellitus with hyperglycemia: Secondary | ICD-10-CM | POA: Diagnosis not present

## 2020-10-06 DIAGNOSIS — L304 Erythema intertrigo: Secondary | ICD-10-CM

## 2020-10-06 DIAGNOSIS — R350 Frequency of micturition: Secondary | ICD-10-CM

## 2020-10-06 LAB — POCT URINALYSIS DIPSTICK
Bilirubin, UA: NEGATIVE
Glucose, UA: POSITIVE — AB
Ketones, UA: NEGATIVE
Nitrite, UA: NEGATIVE
Protein, UA: POSITIVE — AB
Spec Grav, UA: 1.02 (ref 1.010–1.025)
Urobilinogen, UA: 0.2 E.U./dL
pH, UA: 5 (ref 5.0–8.0)

## 2020-10-06 MED ORDER — NYSTATIN 100000 UNIT/GM EX POWD: 1.0000 "application " | Freq: Two times a day (BID) | CUTANEOUS | 0 refills | Status: DC | PRN

## 2020-10-06 MED ORDER — FLUCONAZOLE 150 MG PO TABS: 150.0000 mg | ORAL_TABLET | ORAL | 0 refills | Status: DC | PRN

## 2020-10-06 NOTE — Patient Instructions (Signed)
Intertrigo Intertrigo is skin irritation (inflammation) that happens in warm, moist areas of the body. The irritation can cause a rash and make skin raw and itchy. The rash is usually pink or red. It happens mostly between folds of skin or where skin rubs together, such as:  Between the toes.  In the armpits.  In the groin area.  Under the belly.  Under the breasts.  Around the butt area. This condition is not passed from person to person (is not contagious). What are the causes?  Heat, moisture, rubbing, and not enough air movement.  The condition can be made worse by: ? Sweat. ? Bacteria. ? A fungus, such as yeast. What increases the risk?  Moisture in your skin folds.  You are more likely to develop this condition if you: ? Have diabetes. ? Are overweight. ? Are not able to move around. ? Live in a warm and moist climate. ? Wear splints, braces, or other medical devices. ? Are not able to control your pee (urine) or poop (stool). What are the signs or symptoms?  A pink or red skin rash in the skin fold or near the skin fold.  Raw or scaly skin.  Itching.  A burning feeling.  Bleeding.  Leaking fluid.  A bad smell. How is this treated?  Cleaning and drying your skin.  Taking an antibiotic medicine or using an antibiotic skin cream for a bacterial infection.  Using an antifungal cream on your skin or taking pills for an infection that was caused by a fungus, such as yeast.  Using a steroid ointment to stop the itching and irritation.  Separating the skin fold with a clean cotton cloth to absorb moisture and allow air to flow into the area. Follow these instructions at home:  Keep the affected area clean and dry.  Do not scratch your skin.  Stay cool as much as you can. Use an air conditioner or a fan, if you have one.  Apply over-the-counter and prescription medicines only as told by your doctor.  If you were prescribed an antibiotic medicine,  use it as told by your doctor. Do not stop using the antibiotic even if your condition starts to get better.  Keep all follow-up visits as told by your doctor. This is important. How is this prevented?   Stay at a healthy weight.  Take care of your feet. This is very important if you have diabetes. You should: ? Wear shoes that fit well. ? Keep your feet dry. ? Wear clean cotton or wool socks.  Protect the skin in your groin and butt area as told by your doctor. To do this: ? Follow a regular cleaning routine. ? Use creams, powders, or ointments that protect your skin. ? Change protection pads often.  Do not wear tight clothes. Wear clothes that: ? Are loose. ? Take moisture away from your body. ? Are made of cotton.  Wear a bra that gives good support, if needed.  Shower and dry yourself well after being active. Use a hair dryer on a cool setting to dry between skin folds.  Keep your blood sugar under control if you have diabetes. Contact a doctor if:  Your symptoms do not get better with treatment.  Your symptoms get worse or they spread.  You notice more redness and warmth.  You have a fever. Summary  Intertrigo is skin irritation that occurs when folds of skin rub together.  This condition is caused by heat, moisture,   and rubbing.  This condition may be treated by cleaning and drying your skin and with medicines.  Apply over-the-counter and prescription medicines only as told by your doctor.  Keep all follow-up visits as told by your doctor. This is important. This information is not intended to replace advice given to you by your health care provider. Make sure you discuss any questions you have with your health care provider. Document Revised: 07/30/2018 Document Reviewed: 07/30/2018 [employer]vier Patient Education  2020 [employer]  

## 2020-10-06 NOTE — Progress Notes (Signed)
Patient ID: Susan Bowen, female    DOB: Jan 13, 1932, 86 y.o.   MRN: [id number]  PCP: Steele Sizer, MD  Chief Complaint  Patient presents with  . Urinary Frequency    itching,burning, frequent urination for 6 days. Tried AZo    Subjective:   Susan Bowen is a 86 y.o. female, presents to clinic with CC of the following:  HPI  She presents with vulvovaginal itching and burning.  She tried vagisil but it didn't help and it only burned.  She tried azo. She denies any urinary frequency, urgency, hematuria, dysuria.  No abd pain, N, V, flank pain, change in appetite, fever, chills sweats.  She denies vaginal discharge. Not currently sexually active Rash to external genitals and to groin and in skin folds.   Denies any lesions/ulcerations Blood sugars have been higher, hx of DM, on jardiance, trulicity and metformin   Patient Active Problem List   Diagnosis Date Noted  . Carcinoid tumor, malignant ([place]) 09/08/2019  . Pain in joint of left shoulder 02/25/2019  . Pulmonary hypertension, unspecified ([place]) 12/28/2018  . Paroxysmal A-fib ([place]) 12/28/2018  . Chronic kidney disease, stage III (moderate) ([place]) 04/01/2018  . Elevated parathyroid hormone 01/01/2018  . Elevated uric acid in blood 04/29/2017  . Plantar fasciitis of left foot 03/12/2016  . Postprocedural hypothyroidism 01/23/2016  . Aortic stenosis 09/26/2015  . Allergic rhinitis, seasonal 08/08/2015  . Benign hypertension 08/08/2015  . History of pneumonia 08/08/2015  . Carpal tunnel syndrome 08/08/2015  . Cataract 08/08/2015  . Insomnia, persistent 08/08/2015  . Dyslipidemia 08/08/2015  . History of depression 08/08/2015  . Glaucoma 08/08/2015  . Controlled gout 08/08/2015  . Fever blister 08/08/2015  . Bilateral hearing loss 08/08/2015  . Hemorrhoid 08/08/2015  . H/O iron deficiency anemia 08/08/2015  . Benign neoplasm of colon 08/08/2015  . Impingement syndrome of shoulder 08/08/2015  . Osteoporosis,  post-menopausal 08/08/2015  . Morbid obesity ([place]) 08/08/2015  . Generalized OA 08/08/2015  . Multinodular goiter 08/08/2015  . Asymptomatic varicose veins 08/08/2015  . Polyp of vocal cord 08/08/2015  . History of vertebral compression fracture 08/08/2015  . Diabetes mellitus with proteinuric diabetic nephropathy ([place]) 08/08/2015  . LVH (left ventricular hypertrophy) 08/08/2015  . Moderate tricuspid regurgitation 08/08/2015  . History of radioactive iodine thyroid ablation 08/08/2015  . Lung nodule, solitary 05/12/2014  . Chronic cough 05/11/2014  . Vitamin D deficiency 12/20/2009  . Plantar fascial fibromatosis 12/20/2009  . Personal history of fall 07/20/2008  . Cervical radiculitis 05/11/2008      Current Outpatient Medications:  .  acetaminophen-codeine (TYLENOL #3) 300-30 MG tablet, Take 1 tablet by mouth every 6 (six) hours as needed for moderate pain. For chronic pain , OA okay to give 30 not 7, Disp: 30 tablet, Rfl: 0 .  allopurinol (ZYLOPRIM) 100 MG tablet, TAKE 1 TABLET BY MOUTH EVERY DAY, Disp: 90 tablet, Rfl: 1 .  amLODipine (NORVASC) 5 MG tablet, Take 1 tablet (5 mg total) by mouth daily., Disp: 90 tablet, Rfl: 1 .  Blood Glucose Monitoring Suppl (ACCU-CHEK AVIVA PLUS) w/Device KIT, 1 kit by Does not apply route 3 (three) times daily., Disp: 1 kit, Rfl: 0 .  carvedilol (COREG) 25 MG tablet, TAKE 1 TABLET (25 MG TOTAL) BY MOUTH 2 (TWO) TIMES DAILY. NEW DOSE, Disp: 180 tablet, Rfl: 1 .  COMBIGAN 0.2-0.5 % ophthalmic solution, Place 1 drop into both eyes 2 (two) times daily., Disp: , Rfl:  .  Cyanocobalamin (B-12) 1000 MCG  TABS, Take 1,000 mcg daily by mouth., Disp: , Rfl:  .  diclofenac Sodium (VOLTAREN) 1 % GEL, Apply 2 g topically 3 (three) times daily as needed. , Disp: , Rfl:  .  Dulaglutide (TRULICITY) 3 ZY/6.0YT SOPN, Inject 3 mg as directed once a week., Disp: 12 mL, Rfl: 1 .  ELIQUIS 5 MG TABS tablet, TAKE 1 TABLET BY MOUTH TWICE A DAY, Disp: 180 tablet, Rfl: 1 .   JARDIANCE 25 MG TABS tablet, TAKE 25 MG BY MOUTH DAILY., Disp: 90 tablet, Rfl: 1 .  levothyroxine (SYNTHROID) 50 MCG tablet, Take 1 tablet (50 mcg total) by mouth daily before breakfast., Disp: 90 tablet, Rfl: 1 .  loratadine (CLARITIN) 10 MG tablet, TAKE 1 TABLET BY MOUTH EVERY DAY, Disp: 90 tablet, Rfl: 2 .  metFORMIN (GLUCOPHAGE) 850 MG tablet, TAKE 1 TABLET (850 MG TOTAL) BY MOUTH 2 (TWO) TIMES DAILY., Disp: 180 tablet, Rfl: 1 .  olmesartan (BENICAR) 40 MG tablet, Take 1 tablet (40 mg total) by mouth daily., Disp: 90 tablet, Rfl: 1 .  pantoprazole (PROTONIX) 40 MG tablet, Take 1 tablet (40 mg total) by mouth daily., Disp: 90 tablet, Rfl: 1 .  pravastatin (PRAVACHOL) 40 MG tablet, TAKE 1 TABLET BY MOUTH EVERY DAY, Disp: 90 tablet, Rfl: 1 .  Travoprost, BAK Free, (TRAVATAN) 0.004 % SOLN ophthalmic solution, Place 1 drop into both eyes at bedtime., Disp: , Rfl:  .  Vitamin D, Ergocalciferol, (DRISDOL) 1.25 MG (50000 UNIT) CAPS capsule, TAKE 1 CAPSULE BY MOUTH WEEKLY, Disp: 12 capsule, Rfl: 1 .  glucose blood (ACCU-CHEK AVIVA PLUS) test strip, Check fasting glucose once each morning, and check glucose as needed. (Patient not taking: Reported on 10/06/2020), Disp: 100 each, Rfl: 12 .  Lancets (ACCU-CHEK MULTICLIX) lancets, Use as instructed (Patient not taking: Reported on 10/06/2020), Disp: 204 each, Rfl: 12   Allergies  Allergen Reactions  . Augmentin [Amoxicillin-Pot Clavulanate] Itching    Has patient had a PCN reaction causing immediate rash, facial/tongue/throat swelling, SOB or lightheadedness with hypotension: No Has patient had a PCN reaction causing severe rash involving mucus membranes or skin necrosis: No Has patient had a PCN reaction that required hospitalization: No Has patient had a PCN reaction occurring within the last 10 years: No If all of the above answers are "NO", then may proceed with Cephalosporin use.      Social History   Tobacco Use  . Smoking status: Never Smoker   . Smokeless tobacco: Never Used  Vaping Use  . Vaping Use: Never used  Substance Use Topics  . Alcohol use: No    Alcohol/week: 0.0 standard drinks  . Drug use: No      Chart Review Today: I personally reviewed active problem list, medication list, allergies, family history, social history, health maintenance, notes from last encounter, lab results, imaging with the patient/caregiver today.   Review of Systems 10 Systems reviewed and are negative for acute change except as noted in the HPI.     Objective:   Vitals:   10/06/20 1452  BP: 134/72  Pulse: 94  Resp: 16  Temp: 98 F (36.7 C)  TempSrc: Oral  SpO2: 97%  Weight: 199 lb 11.2 oz (90.6 kg)  Height: 5' 6"  (1.676 m)    Body mass index is 32.23 kg/m.  Physical Exam Vitals and nursing note reviewed.  Constitutional:      General: She is not in acute distress.    Appearance: Normal appearance. She is not ill-appearing, toxic-appearing or  diaphoretic.  HENT:     Head: Normocephalic and atraumatic.     Right Ear: External ear normal.     Left Ear: External ear normal.  Cardiovascular:     Rate and Rhythm: Normal rate and regular rhythm.     Pulses: Normal pulses.     Heart sounds: Normal heart sounds.  Pulmonary:     Effort: Pulmonary effort is normal.     Breath sounds: Normal breath sounds.  Abdominal:     General: Bowel sounds are normal. There is no distension.     Palpations: Abdomen is soft.     Tenderness: There is no abdominal tenderness. There is no right CVA tenderness, guarding or rebound.  Neurological:     Mental Status: She is alert.     Gait: Gait normal.  Psychiatric:        Mood and Affect: Mood normal.        Behavior: Behavior normal.      Results for orders placed or performed in visit on 10/06/20  POCT urinalysis dipstick  Result Value Ref Range   Color, UA yellow    Clarity, UA clear    Glucose, UA Positive (A) Negative   Bilirubin, UA negative    Ketones, UA negative     Spec Grav, UA 1.020 1.010 - 1.025   Blood, UA moderate    pH, UA 5.0 5.0 - 8.0   Protein, UA Positive (A) Negative   Urobilinogen, UA 0.2 0.2 or 1.0 E.U./dL   Nitrite, UA negative    Leukocytes, UA Large (3+) (A) Negative   Appearance clear    Odor none        Assessment & Plan:  1. Pruritic intertrigo Pt CC was reported as urinary frequency, but in discussion with pt today, she denies any urinary freq or urgency - notes all sx are to vulvovaginal area and groin/skin folds. Suspect skin yeast infection/vulvovaginitis vs intertrigo Reviewed conservative management - keeping skin dry, airing out at night, managing blood sugar Nystatin powder and diflucan to treat - nystatin (MYCOSTATIN/NYSTOP) powder; Apply 1 application topically 2 (two) times daily as needed.  Dispense: 30 g; Refill: 0 - fluconazole (DIFLUCAN) 150 MG tablet; Take 1 tablet (150 mg total) by mouth every 3 (three) days as needed (for vaginal itching/yeast infection sx).  Dispense: 2 tablet; Refill: 0  2. Urinary frequency No hematuria, dysuria, urgency - dip positive for glucose, blood, protein, leuk, negative nitrites - she denies urinary sx, so will add urine culture and tx with abx if culture is positive - would hate to tx with broad spectrum abx and only make yeast sx worse.    - POCT urinalysis dipstick - Urine Culture  Plan discussed with pt - explained she can try other otc vaginal yeast tx or jock itch creams F/up as needed   3. Uncontrolled type 2 diabetes mellitus with hyperglycemia (HCC) Last A1C 7.8 and prior to that 8.0, glucosuria- likely due to jardiance with DM and meds higher risk for yeast infections and UTI  Delsa Grana, PA-C 10/06/20 3:10 PM

## 2020-10-07 LAB — URINE CULTURE
MICRO NUMBER:: 11275915
SPECIMEN QUALITY:: ADEQUATE

## 2020-10-10 ENCOUNTER — Encounter: Payer: Self-pay | Admitting: Physician Assistant

## 2020-10-10 ENCOUNTER — Ambulatory Visit (INDEPENDENT_AMBULATORY_CARE_PROVIDER_SITE_OTHER): Payer: Medicare HMO

## 2020-10-10 ENCOUNTER — Encounter: Payer: Self-pay | Admitting: Family Medicine

## 2020-10-10 ENCOUNTER — Ambulatory Visit (INDEPENDENT_AMBULATORY_CARE_PROVIDER_SITE_OTHER): Payer: Medicare HMO | Admitting: Physician Assistant

## 2020-10-10 ENCOUNTER — Other Ambulatory Visit: Payer: Self-pay

## 2020-10-10 ENCOUNTER — Telehealth: Payer: Self-pay | Admitting: Emergency Medicine

## 2020-10-10 ENCOUNTER — Other Ambulatory Visit: Payer: Medicare HMO

## 2020-10-10 VITALS — BP 130/72 | HR 75 | Ht 64.0 in | Wt 201.5 lb

## 2020-10-10 DIAGNOSIS — M109 Gout, unspecified: Secondary | ICD-10-CM | POA: Diagnosis not present

## 2020-10-10 DIAGNOSIS — I10 Essential (primary) hypertension: Secondary | ICD-10-CM

## 2020-10-10 DIAGNOSIS — N1831 Chronic kidney disease, stage 3a: Secondary | ICD-10-CM | POA: Diagnosis not present

## 2020-10-10 DIAGNOSIS — R0989 Other specified symptoms and signs involving the circulatory and respiratory systems: Secondary | ICD-10-CM

## 2020-10-10 DIAGNOSIS — E785 Hyperlipidemia, unspecified: Secondary | ICD-10-CM

## 2020-10-10 DIAGNOSIS — R002 Palpitations: Secondary | ICD-10-CM

## 2020-10-10 DIAGNOSIS — I480 Paroxysmal atrial fibrillation: Secondary | ICD-10-CM | POA: Diagnosis not present

## 2020-10-10 MED ORDER — FLUCONAZOLE 150 MG PO TABS: 150.0000 mg | ORAL_TABLET | ORAL | 0 refills | Status: DC | PRN

## 2020-10-10 MED ORDER — CLOTRIMAZOLE-BETAMETHASONE 1-0.05 % EX CREA: 1.0000 "application " | TOPICAL_CREAM | Freq: Two times a day (BID) | CUTANEOUS | 1 refills | Status: DC | PRN

## 2020-10-10 MED ORDER — AMLODIPINE BESYLATE 10 MG PO TABS: 10.0000 mg | ORAL_TABLET | Freq: Every day | ORAL | 3 refills | Status: DC

## 2020-10-10 NOTE — Patient Instructions (Signed)
Medication Instructions:   Your physician has recommended you make the following change in your medication:   INCREASE Amlodipine dose to 10mg  daily - An Rx was sent to your pharmacy   *If you need a refill on your cardiac medications before your next appointment, please call your pharmacy*   Lab Work: None ordered   Testing/Procedures: None ordered   Follow-Up: At [facility], you and your health needs are our priority.  As part of our continuing mission to provide you with exceptional heart care, we have created designated Provider Care Teams.  These Care Teams include your primary Cardiologist (physician) and Advanced Practice Providers (APPs -  Physician Assistants and Nurse Practitioners) who all work together to provide you with the care you need, when you need it.  We recommend signing up for the patient portal called "MyChart".  Sign up information is provided on this After Visit Summary.  MyChart is used to connect with patients for Virtual Visits (Telemedicine).  Patients are able to view lab/test results, encounter notes, upcoming appointments, etc.  Non-urgent messages can be sent to your provider as well.   To learn more about what you can do with MyChart, go to [email or web address].    Your next appointment:   6 month(s)  The format for your next appointment:   In Person  Provider:   You may see Kathlyn Sacramento, MD or one of the following Advanced Practice Providers on your designated Care Team:    Murray Hodgkins, NP  Christell Faith, PA-C  Marrianne Mood, PA-C  Cadence Grandfield, [place]  Laurann [place], NP

## 2020-10-10 NOTE — Telephone Encounter (Signed)
Patient came by office and stated she is still having a yeast infection itching and burning. Has did all the medication that was given to her. Can something else be called in

## 2020-10-11 NOTE — Telephone Encounter (Signed)
Patient notified

## 2020-10-16 ENCOUNTER — Other Ambulatory Visit: Payer: Self-pay

## 2020-10-16 ENCOUNTER — Ambulatory Visit (INDEPENDENT_AMBULATORY_CARE_PROVIDER_SITE_OTHER): Payer: Medicare HMO | Admitting: Family Medicine

## 2020-10-16 ENCOUNTER — Encounter: Payer: Self-pay | Admitting: Family Medicine

## 2020-10-16 VITALS — BP 140/88 | HR 88 | Temp 98.1°F | Resp 16 | Ht 64.0 in | Wt 200.5 lb

## 2020-10-16 DIAGNOSIS — R002 Palpitations: Secondary | ICD-10-CM

## 2020-10-16 DIAGNOSIS — E0789 Other specified disorders of thyroid: Secondary | ICD-10-CM

## 2020-10-16 DIAGNOSIS — E890 Postprocedural hypothyroidism: Secondary | ICD-10-CM | POA: Diagnosis not present

## 2020-10-16 DIAGNOSIS — D72829 Elevated white blood cell count, unspecified: Secondary | ICD-10-CM | POA: Diagnosis not present

## 2020-10-16 NOTE — Progress Notes (Signed)
Name: Susan Bowen   MRN: [id number]    DOB: 1932-07-08   Date:10/16/2020       Progress Note  Subjective  Chief Complaint  Consult  HPI   Thyroid mass: patient was recently seen at cardiologist office for palpitation/ heart flutters at night and also for carotid doppler that showed a thyroid mass 0.7 cm x 0.7 cm and advised to follow up with me for further evaluation. She states flutters usually just at night, not associated with SOB, diaphoresis or chest pain. She has anemia that is being evaluated by hematologist. We will hold off on checking cBC today but we will get a TSH and also order the thyroid US  Vulvar pruritus: it has improved with diflucan and nystatin, explained it may be due to Jardiance and discussed changing medication , consider low dose insulin, but she would like to hold off on changes for now. She will try adding A&D to decrease irritation    Patient Active Problem List   Diagnosis Date Noted  . Carcinoid tumor, malignant ([place]) 09/08/2019  . Pain in joint of left shoulder 02/25/2019  . Pulmonary hypertension, unspecified ([place]) 12/28/2018  . Paroxysmal A-fib ([place]) 12/28/2018  . Chronic kidney disease, stage III (moderate) ([place]) 04/01/2018  . Elevated parathyroid hormone 01/01/2018  . Elevated uric acid in blood 04/29/2017  . Plantar fasciitis of left foot 03/12/2016  . Postprocedural hypothyroidism 01/23/2016  . Aortic stenosis 09/26/2015  . Allergic rhinitis, seasonal 08/08/2015  . Benign hypertension 08/08/2015  . History of pneumonia 08/08/2015  . Carpal tunnel syndrome 08/08/2015  . Cataract 08/08/2015  . Insomnia, persistent 08/08/2015  . Dyslipidemia 08/08/2015  . History of depression 08/08/2015  . Glaucoma 08/08/2015  . Controlled gout 08/08/2015  . Fever blister 08/08/2015  . Bilateral hearing loss 08/08/2015  . Hemorrhoid 08/08/2015  . H/O iron deficiency anemia 08/08/2015  . Benign neoplasm of colon 08/08/2015  . Impingement syndrome of  shoulder 08/08/2015  . Osteoporosis, post-menopausal 08/08/2015  . Morbid obesity ([place]) 08/08/2015  . Generalized OA 08/08/2015  . Multinodular goiter 08/08/2015  . Asymptomatic varicose veins 08/08/2015  . Polyp of vocal cord 08/08/2015  . History of vertebral compression fracture 08/08/2015  . Diabetes mellitus with proteinuric diabetic nephropathy ([place]) 08/08/2015  . LVH (left ventricular hypertrophy) 08/08/2015  . Moderate tricuspid regurgitation 08/08/2015  . History of radioactive iodine thyroid ablation 08/08/2015  . Lung nodule, solitary 05/12/2014  . Chronic cough 05/11/2014  . Vitamin D deficiency 12/20/2009  . Plantar fascial fibromatosis 12/20/2009  . Personal history of fall 07/20/2008  . Cervical radiculitis 05/11/2008    Past Surgical History:  Procedure Laterality Date  . ABDOMINAL HYSTERECTOMY  1986  . APPENDECTOMY    . BREAST SURGERY Bilateral 1983   biopsy of each side, negative  . CARPAL TUNNEL RELEASE    . CATARACT EXTRACTION W/PHACO Left 09/03/2017   Procedure: CATARACT EXTRACTION PHACO AND INTRAOCULAR LENS PLACEMENT (IOC)-LEFT DIABETIC;  Surgeon: Birder Robson, MD;  Location: [facility] ORS;  Service: Ophthalmology;  Laterality: Left;  [place] 01:10.2 AP% 16.7 CDE 11.71 Fluid Pack Lot # [id number]  . CATARACT EXTRACTION W/PHACO Right 09/30/2017   Procedure: CATARACT EXTRACTION PHACO AND INTRAOCULAR LENS PLACEMENT (IOC);  Surgeon: Birder Robson, MD;  Location: [facility] ORS;  Service: Ophthalmology;  Laterality: Right;  [place] 01:35.1 AP% 13.1 CDE 12.44 Fluid pack Lot # [id number]  . COLON SURGERY    . EYE SURGERY Left 09/03/2017   Cataract extraction  . FEMUR FRACTURE SURGERY Left   .  FRACTURE SURGERY  2001   left femur  . HEMORROIDECTOMY    . polyp removed from vocal cord      Family History  Problem Relation Age of Onset  . Diabetes Mother   . Hypertension Mother   . Dementia Mother   . Hypertension Son     Social History   Tobacco Use  . Smoking  status: Never Smoker  . Smokeless tobacco: Never Used  Substance Use Topics  . Alcohol use: No    Alcohol/week: 0.0 standard drinks     Current Outpatient Medications:  .  acetaminophen-codeine (TYLENOL #3) 300-30 MG tablet, Take 1 tablet by mouth every 6 (six) hours as needed for moderate pain. For chronic pain , OA okay to give 30 not 7, Disp: 30 tablet, Rfl: 0 .  allopurinol (ZYLOPRIM) 100 MG tablet, TAKE 1 TABLET BY MOUTH EVERY DAY, Disp: 90 tablet, Rfl: 1 .  amLODipine (NORVASC) 10 MG tablet, Take 1 tablet (10 mg total) by mouth daily., Disp: 90 tablet, Rfl: 3 .  Blood Glucose Monitoring Suppl (ACCU-CHEK AVIVA PLUS) w/Device KIT, 1 kit by Does not apply route 3 (three) times daily., Disp: 1 kit, Rfl: 0 .  carvedilol (COREG) 25 MG tablet, TAKE 1 TABLET (25 MG TOTAL) BY MOUTH 2 (TWO) TIMES DAILY. NEW DOSE, Disp: 180 tablet, Rfl: 1 .  clotrimazole-betamethasone (LOTRISONE) cream, Apply 1 application topically 2 (two) times daily as needed. To affected skin, Disp: 15 g, Rfl: 1 .  COMBIGAN 0.2-0.5 % ophthalmic solution, Place 1 drop into both eyes 2 (two) times daily., Disp: , Rfl:  .  Cyanocobalamin (B-12) 1000 MCG TABS, Take 1,000 mcg daily by mouth., Disp: , Rfl:  .  diclofenac Sodium (VOLTAREN) 1 % GEL, Apply 2 g topically 3 (three) times daily as needed. , Disp: , Rfl:  .  Dulaglutide (TRULICITY) 3 FX/9.0WI SOPN, Inject 3 mg as directed once a week., Disp: 12 mL, Rfl: 1 .  ELIQUIS 5 MG TABS tablet, TAKE 1 TABLET BY MOUTH TWICE A DAY, Disp: 180 tablet, Rfl: 1 .  fluconazole (DIFLUCAN) 150 MG tablet, Take 1 tablet (150 mg total) by mouth every 3 (three) days as needed (for vaginal itching/yeast infection sx)., Disp: 2 tablet, Rfl: 0 .  fluconazole (DIFLUCAN) 150 MG tablet, Take 1 tablet (150 mg total) by mouth every 3 (three) days as needed for up to 3 doses (for vaginal itching/yeast infection sx)., Disp: 3 tablet, Rfl: 0 .  glucose blood (ACCU-CHEK AVIVA PLUS) test strip, Check fasting  glucose once each morning, and check glucose as needed., Disp: 100 each, Rfl: 12 .  JARDIANCE 25 MG TABS tablet, TAKE 25 MG BY MOUTH DAILY., Disp: 90 tablet, Rfl: 1 .  Lancets (ACCU-CHEK MULTICLIX) lancets, Use as instructed, Disp: 204 each, Rfl: 12 .  levothyroxine (SYNTHROID) 50 MCG tablet, Take 1 tablet (50 mcg total) by mouth daily before breakfast., Disp: 90 tablet, Rfl: 1 .  loratadine (CLARITIN) 10 MG tablet, TAKE 1 TABLET BY MOUTH EVERY DAY, Disp: 90 tablet, Rfl: 2 .  metFORMIN (GLUCOPHAGE) 850 MG tablet, TAKE 1 TABLET (850 MG TOTAL) BY MOUTH 2 (TWO) TIMES DAILY., Disp: 180 tablet, Rfl: 1 .  nystatin (MYCOSTATIN/NYSTOP) powder, Apply 1 application topically 2 (two) times daily as needed., Disp: 30 g, Rfl: 0 .  olmesartan (BENICAR) 40 MG tablet, Take 1 tablet (40 mg total) by mouth daily., Disp: 90 tablet, Rfl: 1 .  pravastatin (PRAVACHOL) 40 MG tablet, TAKE 1 TABLET BY MOUTH EVERY DAY,  Disp: 90 tablet, Rfl: 1 .  Travoprost, BAK Free, (TRAVATAN) 0.004 % SOLN ophthalmic solution, Place 1 drop into both eyes at bedtime., Disp: , Rfl:  .  Vitamin D, Ergocalciferol, (DRISDOL) 1.25 MG (50000 UNIT) CAPS capsule, TAKE 1 CAPSULE BY MOUTH WEEKLY, Disp: 12 capsule, Rfl: 1 .  pantoprazole (PROTONIX) 40 MG tablet, Take 1 tablet (40 mg total) by mouth daily. (Patient not taking: Reported on 10/16/2020), Disp: 90 tablet, Rfl: 1  Allergies  Allergen Reactions  . Augmentin [Amoxicillin-Pot Clavulanate] Itching    Has patient had a PCN reaction causing immediate rash, facial/tongue/throat swelling, SOB or lightheadedness with hypotension: No Has patient had a PCN reaction causing severe rash involving mucus membranes or skin necrosis: No Has patient had a PCN reaction that required hospitalization: No Has patient had a PCN reaction occurring within the last 10 years: No If all of the above answers are "NO", then may proceed with Cephalosporin use.     I personally reviewed active problem list,  medication list, allergies, family history, social history, health maintenance with the patient/caregiver today.   ROS  Ten systems reviewed and is negative except as mentioned in HPI   Objective  Vitals:   10/16/20 1115  BP: 140/88  Pulse: 88  Resp: 16  Temp: 98.1 F (36.7 C)  TempSrc: Oral  SpO2: 98%  Weight: 200 lb 8 oz (90.9 kg)  Height: 5' 4" (1.626 m)    Body mass index is 34.42 kg/m.  Physical Exam  Constitutional: Patient appears well-developed and well-nourished. Obese  No distress.  HEENT: head atraumatic, normocephalic, pupils equal and reactive to light,  neck supple Cardiovascular: Normal rate, regular rhythm and normal heart sounds.  No murmur heard. No BLE edema. Pulmonary/Chest: Effort normal and breath sounds normal. No respiratory distress. Abdominal: Soft.  There is no tenderness. Psychiatric: Patient has a normal mood and affect. behavior is normal. Judgment and thought content normal.  Recent Results (from the past 2160 hour(s))  Basic metabolic panel     Status: Abnormal   Collection Time: 07/27/20  2:55 PM  Result Value Ref Range   Glucose 132 (H) 65 - 99 mg/dL   BUN 11 8 - 27 mg/dL   Creatinine, Ser 0.95 0.57 - 1.00 mg/dL   GFR calc non Af Amer 54 (L) >59 mL/min/1.73   GFR calc Af Amer 63 >59 mL/min/1.73    Comment: **[facility] currently reports eGFR in compliance with the current**   recommendations of the [employer]. [facility] will   update reporting as new guidelines are published from the NKF-ASN   Task force.    BUN/Creatinine Ratio 12 12 - 28   Sodium 140 134 - 144 mmol/L   Potassium 4.3 3.5 - 5.2 mmol/L   Chloride 103 96 - 106 mmol/L   CO2 22 20 - 29 mmol/L   Calcium 10.7 (H) 8.7 - 10.3 mg/dL  TSH     Status: None   Collection Time: 07/27/20  2:55 PM  Result Value Ref Range   TSH 3.470 0.450 - 4.500 uIU/mL  T4, free     Status: None   Collection Time: 07/27/20  2:55 PM  Result Value Ref Range   Free T4 1.32 0.82 -  1.77 ng/dL  Basic metabolic panel     Status: Abnormal   Collection Time: 08/17/20 11:46 AM  Result Value Ref Range   Sodium 139 135 - 145 mmol/L   Potassium 4.0 3.5 - 5.1 mmol/L   Chloride 104  98 - 111 mmol/L   CO2 25 22 - 32 mmol/L   Glucose, Bld 208 (H) 70 - 99 mg/dL    Comment: Glucose reference range applies only to samples taken after fasting for at least 8 hours.   BUN 8 8 - 23 mg/dL   Creatinine, Ser 1.10 (H) 0.44 - 1.00 mg/dL   Calcium 9.0 8.9 - 10.3 mg/dL   GFR, Estimated 45 (L) >60 mL/min   Anion gap 10 5 - 15    Comment: Performed at [facility], [address] [place], [place] [address]  Chromogranin A     Status: Abnormal   Collection Time: 09/08/20 10:13 AM  Result Value Ref Range   Chromogranin A (ng/mL) 178.3 (H) 0.0 - 101.8 ng/mL    Comment: (NOTE) Results of this test are labeled for research purposes only by the assay's manufacturer. The performance characteristics of this assay have not been established by the manufacturer. The result should not be used for treatment or for diagnostic purposes without confirmation of the diagnosis by another medically established diagnostic product or procedure. The performance characteristics were determined by [facility]. Chromogranin A performed by Thermofisher/BRAHMS KRYPTOR methodology Values obtained with different assay methods or kits cannot be used interchangeably. Performed At: [facility] [address] [place], [place] [id number] Nagendra Sanjai MD Ph:[phone]   Comprehensive metabolic panel     Status: Abnormal   Collection Time: 09/08/20 10:13 AM  Result Value Ref Range   Sodium 141 135 - 145 mmol/L   Potassium 3.8 3.5 - 5.1 mmol/L   Chloride 108 98 - 111 mmol/L   CO2 26 22 - 32 mmol/L   Glucose, Bld 144 (H) 70 - 99 mg/dL    Comment: Glucose reference range applies only to samples taken after fasting for at least 8 hours.   BUN 8 8 - 23 mg/dL   Creatinine, Ser 0.95 0.44 - 1.00 mg/dL    Calcium 9.1 8.9 - 10.3 mg/dL   Total Protein 6.6 6.5 - 8.1 g/dL   Albumin 3.4 (L) 3.5 - 5.0 g/dL   AST 13 (L) 15 - 41 U/L   ALT 12 0 - 44 U/L   Alkaline Phosphatase 51 38 - 126 U/L   Total Bilirubin 0.4 0.3 - 1.2 mg/dL   GFR, Estimated 58 (L) >60 mL/min    Comment: (NOTE) Calculated using the CKD-EPI Creatinine Equation (2021)    Anion gap 7 5 - 15    Comment: Performed at [facility], [address] [place], [place] [address]  CBC with Differential/Platelet     Status: Abnormal   Collection Time: 09/08/20 10:13 AM  Result Value Ref Range   WBC 11.6 (H) 4.0 - 10.5 K/uL   RBC 4.04 3.87 - 5.11 MIL/uL   Hemoglobin 10.2 (L) 12.0 - 15.0 g/dL   HCT 33.3 (L) 36.0 - 46.0 %   MCV 82.4 80.0 - 100.0 fL   MCH 25.2 (L) 26.0 - 34.0 pg   MCHC 30.6 30.0 - 36.0 g/dL   RDW 16.7 (H) 11.5 - 15.5 %   Platelets 315 150 - 400 K/uL   nRBC 0.0 0.0 - 0.2 %   Neutrophils Relative % 64 %   Neutro Abs 7.4 1.7 - 7.7 K/uL   Lymphocytes Relative 28 %   Lymphs Abs 3.3 0.7 - 4.0 K/uL   Monocytes Relative 6 %   Monocytes Absolute 0.7 0.1 - 1.0 K/uL   Eosinophils Relative 2 %   Eosinophils Absolute   0.2 0.0 - 0.5 K/uL   Basophils Relative 0 %   Basophils Absolute 0.1 0.0 - 0.1 K/uL   Immature Granulocytes 0 %   Abs Immature Granulocytes 0.03 0.00 - 0.07 K/uL    Comment: Performed at [facility], [address] [place], [place] [address]  POCT HgB A1C     Status: Abnormal   Collection Time: 09/12/20 10:49 AM  Result Value Ref Range   Hemoglobin A1C 7.8 (A) 4.0 - 5.6 %   HbA1c POC (<> result, manual entry)     HbA1c, POC (prediabetic range)     HbA1c, POC (controlled diabetic range)    POCT urinalysis dipstick     Status: Abnormal   Collection Time: 10/06/20  2:57 PM  Result Value Ref Range   Color, UA yellow    Clarity, UA clear    Glucose, UA Positive (A) Negative   Bilirubin, UA negative    Ketones, UA negative    Spec Grav, UA 1.020 1.010 - 1.025   Blood, UA moderate    pH, UA  5.0 5.0 - 8.0   Protein, UA Positive (A) Negative   Urobilinogen, UA 0.2 0.2 or 1.0 E.U./dL   Nitrite, UA negative    Leukocytes, UA Large (3+) (A) Negative   Appearance clear    Odor none   Urine Culture     Status: None   Collection Time: 10/06/20  3:25 PM   Specimen: Urine  Result Value Ref Range   MICRO NUMBER: [id number]    SPECIMEN QUALITY: Adequate    Sample Source URINE    STATUS: FINAL    Result:      Growth of mixed flora was isolated, suggesting probable contamination. No further testing will be performed. If clinically indicated, recollection using a method to minimize contamination, with prompt transfer to Urine Culture Transport Tube, is  recommended.      PHQ2/9: Depression screen [facility] 2/9 10/16/2020 10/06/2020 09/12/2020 05/09/2020 01/07/2020  Decreased Interest 0 0 0 0 0  Down, Depressed, Hopeless 0 0 0 0 0  PHQ - 2 Score 0 0 0 0 0  Altered sleeping - - - - 0  Tired, decreased energy - - - - 0  Change in appetite - - - - 0  Feeling bad or failure about yourself  - - - - 0  Trouble concentrating - - - - 0  Moving slowly or fidgety/restless - - - - 0  Suicidal thoughts - - - - 0  PHQ-9 Score - - - - 0  Difficult doing work/chores - - - - -    phq 9 is negative   Fall Risk: Fall Risk  10/16/2020 10/06/2020 09/12/2020 05/09/2020 01/07/2020  Falls in the past year? 0 0 0 1 0  Number falls in past yr: 0 0 0 0 0  Injury with Fall? 0 0 0 0 0  Risk for fall due to : - - - Impaired balance/gait;Impaired mobility -  Follow up - Falls evaluation completed - Falls prevention discussed -     Functional Status Survey: Is the patient deaf or have difficulty hearing?: No Does the patient have difficulty seeing, even when wearing glasses/contacts?: No Does the patient have difficulty concentrating, remembering, or making decisions?: No Does the patient have difficulty walking or climbing stairs?: No Does the patient have difficulty dressing or bathing?: No Does the patient  have difficulty doing errands alone such as visiting a doctor's office or shopping?: No  Assessment & Plan  1. Palpitation  - CBC with Differential/Platelet  2. Hypothyroidism, postablative  - TSH  3. Leukocytosis, unspecified type  - CBC with Differential/Platelet

## 2020-10-17 LAB — TSH: TSH: 3.4 mIU/L (ref 0.40–4.50)

## 2020-10-26 ENCOUNTER — Ambulatory Visit
Admission: RE | Admit: 2020-10-26 | Discharge: 2020-10-26 | Disposition: A | Discharge status: Home / Self Care | Payer: Medicare HMO | Source: Ambulatory Visit | Attending: Family Medicine | Admitting: Family Medicine

## 2020-10-26 ENCOUNTER — Other Ambulatory Visit: Payer: Self-pay

## 2020-10-26 DIAGNOSIS — E0789 Other specified disorders of thyroid: Secondary | ICD-10-CM

## 2020-10-26 DIAGNOSIS — E042 Nontoxic multinodular goiter: Secondary | ICD-10-CM | POA: Diagnosis not present

## 2020-11-02 ENCOUNTER — Other Ambulatory Visit: Payer: Self-pay | Admitting: Family Medicine

## 2020-11-02 ENCOUNTER — Other Ambulatory Visit: Payer: Self-pay | Admitting: Cardiovascular Disease

## 2020-11-02 DIAGNOSIS — F5101 Primary insomnia: Secondary | ICD-10-CM

## 2020-11-02 DIAGNOSIS — I480 Paroxysmal atrial fibrillation: Secondary | ICD-10-CM

## 2020-11-02 DIAGNOSIS — I10 Essential (primary) hypertension: Secondary | ICD-10-CM

## 2020-11-02 DIAGNOSIS — M109 Gout, unspecified: Secondary | ICD-10-CM

## 2020-11-02 DIAGNOSIS — J302 Other seasonal allergic rhinitis: Secondary | ICD-10-CM

## 2020-11-02 DIAGNOSIS — N1831 Chronic kidney disease, stage 3a: Secondary | ICD-10-CM

## 2020-11-02 NOTE — Telephone Encounter (Signed)
Prescription refill request for Eliquis received. Indication: a fib Last office visit: 10/10/20 Scr: 0.95 Age: 86 Weight: 90kg

## 2020-11-02 NOTE — Telephone Encounter (Signed)
Please review for refill. Thanks!  

## 2020-11-02 NOTE — Telephone Encounter (Signed)
Requested medication (s) are due for refill today: yes  Requested medication (s) are on the active medication list: yes  Last refill:  Tylenol #3 07/19/20 #30  Future visit scheduled: yes  Notes to clinic:  Please review for refill. Refill for Tylenol # 3 not delegated. Colchicine and Trazodone not on patient's current med list    Requested Prescriptions  Pending Prescriptions Disp Refills   acetaminophen-codeine (TYLENOL #3) 300-30 MG tablet [Pharmacy Med Name: ACETAMINOPHEN-COD #3 TABLET] 30 tablet 0    Sig: TAKE 1 TABLET BY MOUTH EVERY 6 HOURS AS NEEDED FOR CHRONIC PAIN      Not Delegated - Analgesics:  Opioid Agonist Combinations Failed - 11/02/2020 11:47 AM      Failed - This refill cannot be delegated      Failed - Urine Drug Screen completed in last 360 days      Passed - Valid encounter within last 6 months    Recent Outpatient Visits           2 weeks ago [facility] Steele Sizer, MD   3 weeks ago Pruritic intertrigo   [facility] Delsa Grana, PA-C   1 month ago Dyslipidemia associated with type 2 diabetes mellitus [facility])   [facility] [place], Drue Stager, MD   5 months ago Diabetes mellitus with proteinuric diabetic nephropathy [facility])   [facility] [place], Drue Stager, MD   10 months ago Diabetes mellitus with proteinuric diabetic nephropathy [facility])   [facility] Steele Sizer, MD       Future Appointments             In 3 weeks Sindy Guadeloupe, MD [facility]   In 2 months Steele Sizer, MD [facility] [place]   In 5 months Arida, Mertie Clause, MD [place], [facility]               traZODone (DESYREL) 50 MG tablet [Pharmacy Med Name: TRAZODONE 50 MG TABLET] 90 tablet 0    Sig: TAKE 0.5-1 TABLETS (25-50 MG TOTAL) BY MOUTH AT BEDTIME AS NEEDED FOR SLEEP.      Psychiatry:  Antidepressants - Serotonin Modulator Passed - 11/02/2020 11:47 AM      Passed - Completed PHQ-2 or PHQ-9 in the last 360 days      Passed - Valid encounter within last 6 months    Recent Outpatient Visits           2 weeks ago [facility] Sizer, MD   3 weeks ago [facility] Delsa Grana, PA-C   1 month ago Dyslipidemia associated with type 2 diabetes mellitus [facility])   [facility] Steele Sizer, MD   5 months ago Diabetes mellitus with proteinuric diabetic nephropathy [facility])   [facility] Steele Sizer, MD   10 months ago Diabetes mellitus with proteinuric diabetic nephropathy [facility])   [facility] Steele Sizer, MD       Future Appointments             In 3 weeks Sindy Guadeloupe, MD [facility]   In 2 months Steele Sizer, MD [facility], [place]   In 5 months Fletcher Anon, Mertie Clause, MD [facility] [place]  colchicine 0.6 MG tablet [Pharmacy Med Name: COLCHICINE 0.6 MG TABLET] 30 tablet 0    Sig: TAKE 1-2 TABLETS (0.6-1.2 MG TOTAL) BY MOUTH DAILY AS NEEDED. FOR GOUT, NOT DAILY      Endocrinology:  Gout Agents Passed - 11/02/2020 11:47 AM      Passed - Uric Acid in normal range and within 360 days    Uric Acid, Serum  Date Value Ref Range Status  05/09/2020 6.6 2.5 - 7.0 mg/dL Final    Comment:    Therapeutic target for gout patients: <6.0 mg/dL .    Uric Acid  Date Value Ref Range Status  12/19/2016 8.5 (H) 2.5 - 7.1 mg/dL Final    Comment:               Therapeutic target for gout patients: <6.0          Passed - Cr in normal range and within 360 days    Creat  Date Value Ref Range Status  05/09/2020 1.12 (H) 0.60 - 0.88 mg/dL Final    Comment:    For patients >53 years of age, the reference limit for Creatinine is  approximately 13% higher for people identified as African-American. .    Creatinine, Ser  Date Value Ref Range Status  09/08/2020 0.95 0.44 - 1.00 mg/dL Final   Creatinine, Urine  Date Value Ref Range Status  01/07/2020 100 20 - 275 mg/dL Final          Passed - Valid encounter within last 12 months    Recent Outpatient Visits           2 weeks ago [facility] Steele Sizer, MD   3 weeks ago Pruritic intertrigo   [facility] Delsa Grana, PA-C   1 month ago Dyslipidemia associated with type 2 diabetes mellitus [facility])   [facility] [place], Drue Stager, MD   5 months ago Diabetes mellitus with proteinuric diabetic nephropathy [facility])   [facility] [place], Drue Stager, MD   10 months ago Diabetes mellitus with proteinuric diabetic nephropathy [facility])   [facility] Steele Sizer, MD       Future Appointments             In 3 weeks Sindy Guadeloupe, MD [facility]   In 2 months Steele Sizer, MD [facility], [place]   In 5 months Fletcher Anon, Mertie Clause, MD [facility], [facility]              Signed Prescriptions Disp Refills   loratadine (CLARITIN) 10 MG tablet 90 tablet 2    Sig: TAKE 1 TABLET BY MOUTH EVERY DAY      Ear, Nose, and Throat:  Antihistamines Passed - 11/02/2020 11:47 AM      Passed - Valid encounter within last 12 months    Recent Outpatient Visits           2 weeks ago [facility] Sizer, MD   3 weeks ago Pruritic intertrigo   [facility] [place], Kristeen Miss, PA-C   1 month ago Dyslipidemia associated with type 2 diabetes mellitus [facility])   [facility] [place], Drue Stager, MD   5 months ago Diabetes mellitus with proteinuric diabetic nephropathy [facility])   [facility] [place], Drue Stager, MD    10 months ago Diabetes mellitus with proteinuric diabetic nephropathy ([place])  [facility] Alba Cory, MD       Future Appointments             In 3 weeks Creig Hines, [facility]   In 2 months Alba Cory, MD [facility]   In 5 months Kirke Corin, Chelsea Aus, MD [facility] [place], [facility]              Refused Prescriptions Disp Refills   amLODipine (NORVASC) 2.5 MG tablet [Pharmacy Med Name: AMLODIPINE BESYLATE 2.5 MG TAB] 90 tablet 0    Sig: TAKE 1 TABLET BY MOUTH EVERY DAY      Cardiovascular:  Calcium Channel Blockers Failed - 11/02/2020 11:47 AM      Failed - Last BP in normal range    BP Readings from Last 1 Encounters:  10/16/20 140/88          Passed - Valid encounter within last 6 months    Recent Outpatient Visits           2 weeks ago Palpitation   [facility] Cory, MD   3 weeks ago Pruritic intertrigo   [facility] Danelle Berry, PA-C   1 month ago Dyslipidemia associated with type 2 diabetes mellitus [facility])   [facility] Alba Cory, MD   5 months ago Diabetes mellitus with proteinuric diabetic nephropathy [facility])   [facility] Alba Cory, MD   10 months ago Diabetes mellitus with proteinuric diabetic nephropathy [facility])   [facility] Alba Cory, MD       Future Appointments             In 3 weeks Creig Hines, MD [facility]   In 2 months Alba Cory, MD [facility], [facility]   In 5 months Kirke Corin, Chelsea Aus, MD [facility], [facility]

## 2020-11-05 DIAGNOSIS — L02224 Furuncle of groin: Secondary | ICD-10-CM | POA: Diagnosis not present

## 2020-11-05 DIAGNOSIS — B372 Candidiasis of skin and nail: Secondary | ICD-10-CM | POA: Diagnosis not present

## 2020-11-06 IMAGING — CT CT ABD-PELV W/ CM
2 of 6 series · 14 of 36 positions shown, 17 images · IV contrast (omnipaque)
Comparison: 08/30/2019

CLINICAL DATA: Restaging well differentiated neuroendocrine tumor
of the small bowel.

EXAM:
CT CHEST, ABDOMEN, AND PELVIS WITH CONTRAST
TECHNIQUE: Multidetector CT imaging of the chest, abdomen and pelvis was
performed following the standard protocol during bolus
administration of intravenous contrast.
CONTRAST:  75mL OMNIPAQUE IOHEXOL 300 MG/ML  SOLN

[Series 3: thins · axial · 0.73mm/px · z∈[-603,-62]mm · 11 of 862 slices shown, 14 images]
[im 44/862  mediastinal]
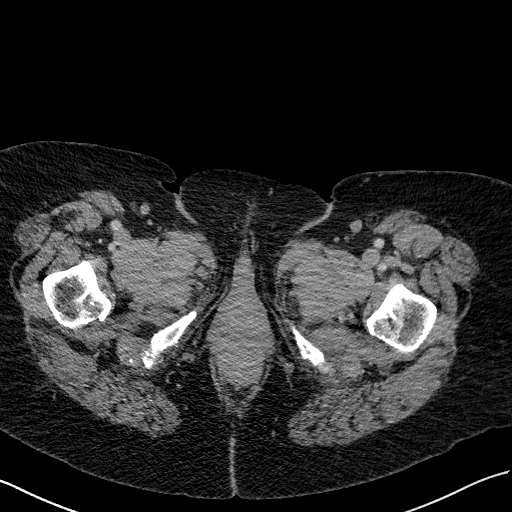
[im 44/862  lung]
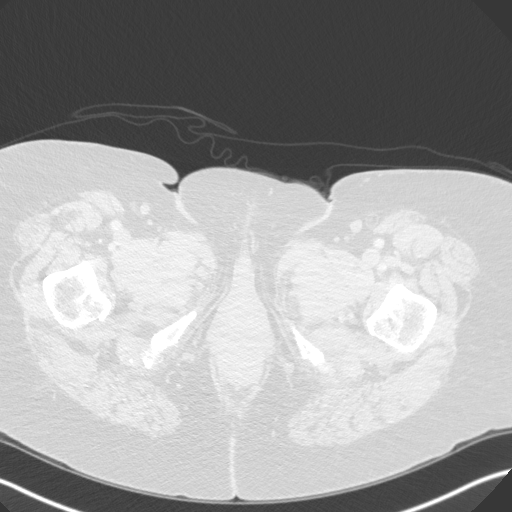
[im 130/862  lung]
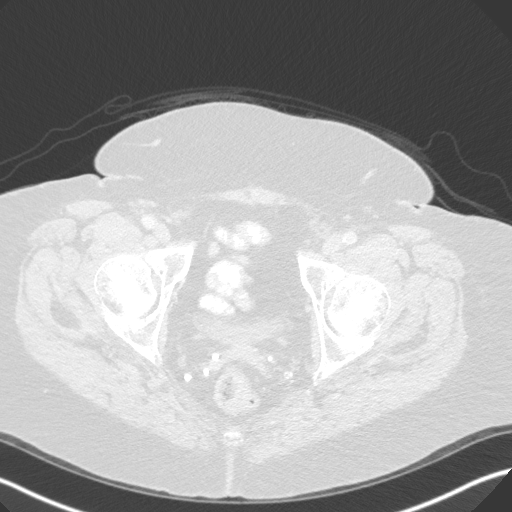
[im 216/862  lung]
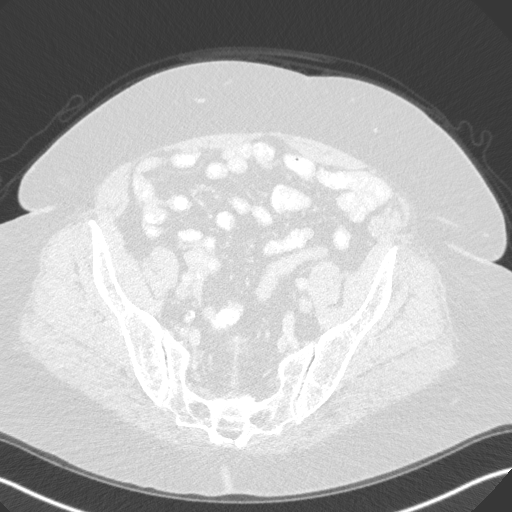
[im 302/862  lung]
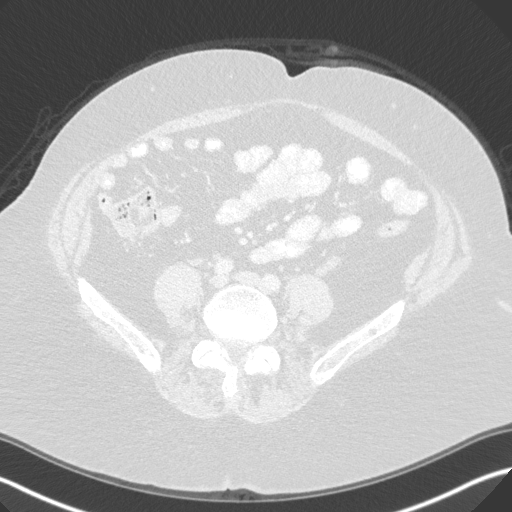
[im 345/862  mediastinal]
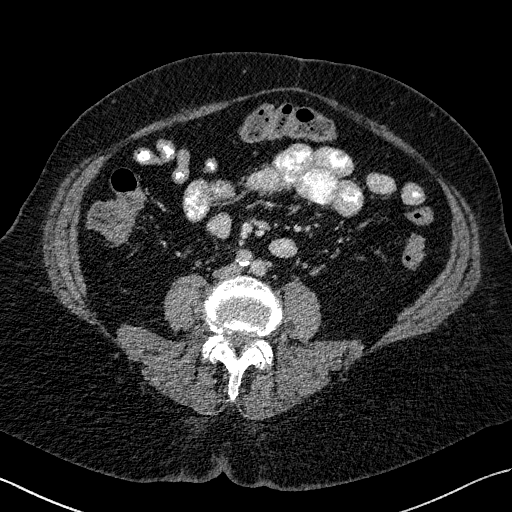
[im 345/862  lung]
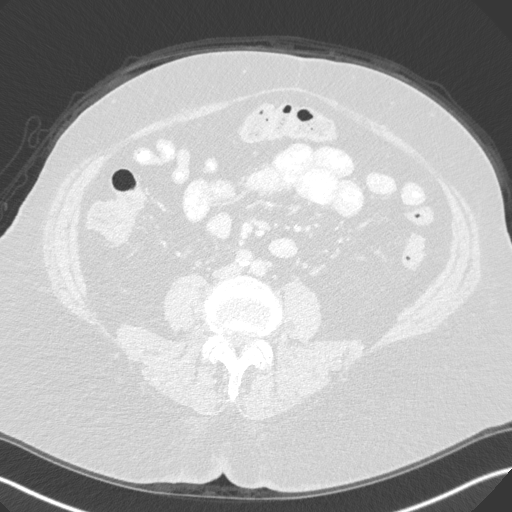
[im 431/862  lung]
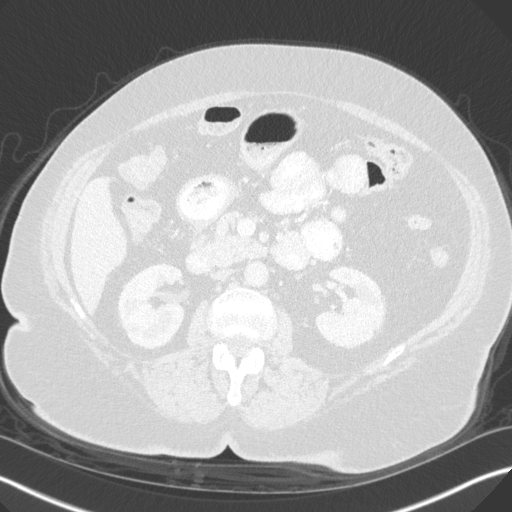
[im 517/862  lung]
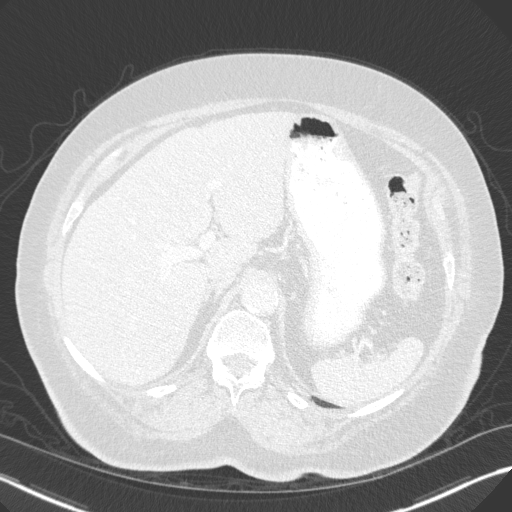
[im 560/862  lung]
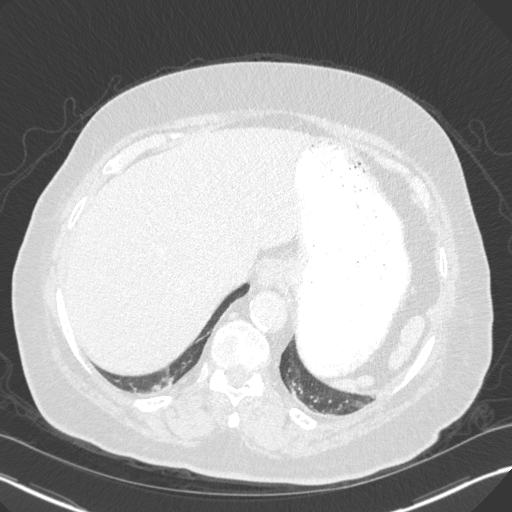
[im 646/862  mediastinal]
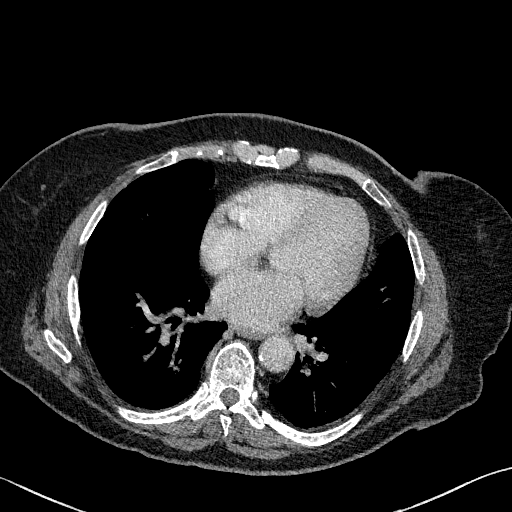
[im 646/862  lung]
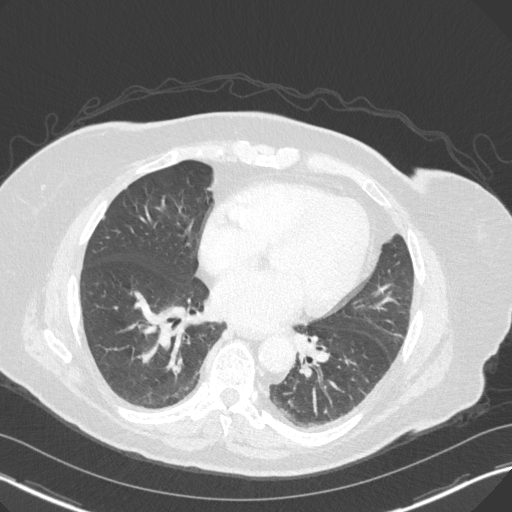
[im 732/862  lung]
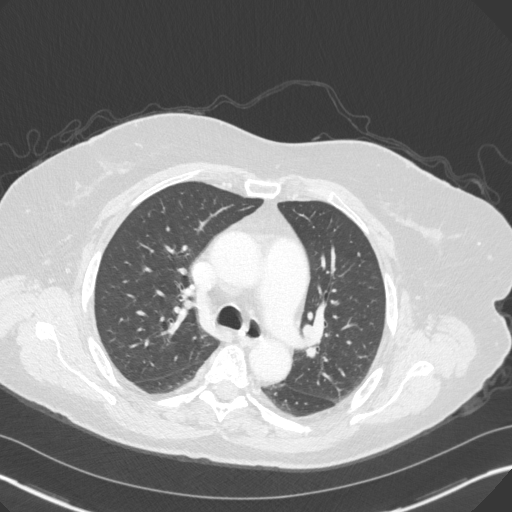
[im 818/862  lung]
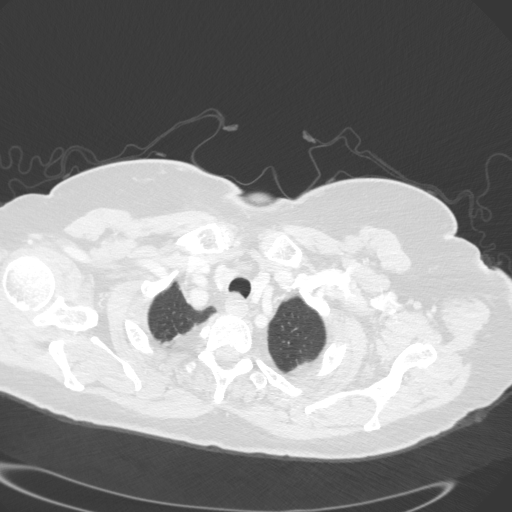

[Series 5: coronals · coronal · 0.82mm/px · 3 of 166 slices shown]
[im 34/166  lung]
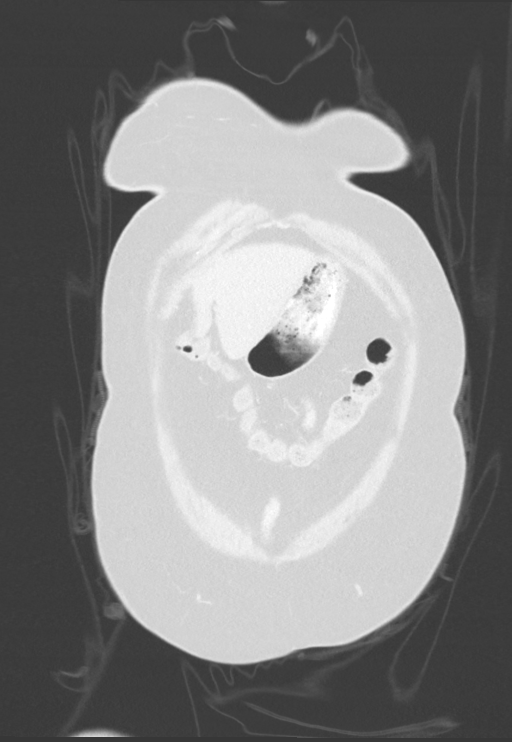
[im 67/166  lung]
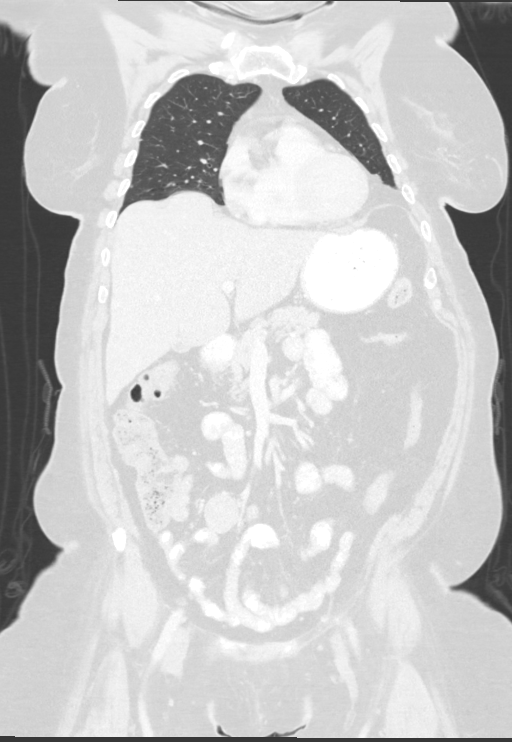
[im 100/166  lung]
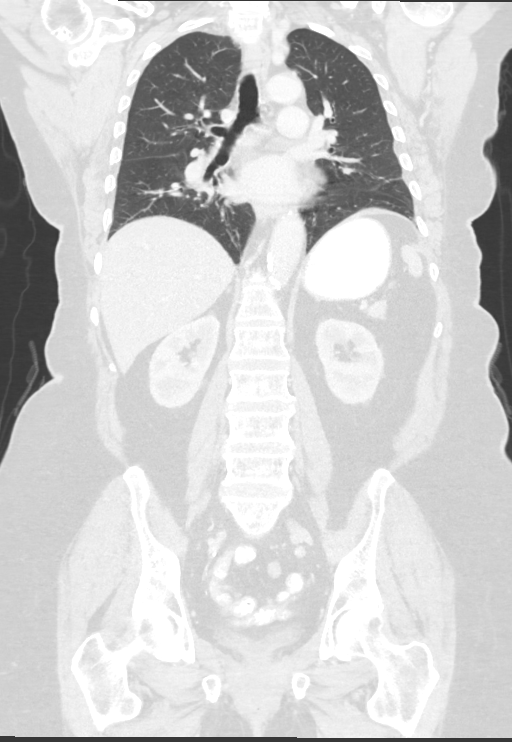

[14 of 36 positions shown; findings below may reference images not displayed]

FINDINGS: CT CHEST FINDINGS

Cardiovascular: The heart size appears normal. No pericardial
effusion. Aortic atherosclerosis. The main pulmonary artery measures
3.8 cm in diameter.

Mediastinum/Nodes: No enlarged mediastinal, hilar, or axillary lymph
nodes. Thyroid gland, trachea, and esophagus demonstrate no
significant findings.

Lungs/Pleura: No pleural effusion identified. A few small scattered
lung nodules are noted which are unchanged from previous exam. No
new lung nodules.

Musculoskeletal: Unchanged focal area of sclerosis involving left
anterior rib, image 65/4 and image 45/4. Favor benign bone islands.
No aggressive lytic or sclerotic bone lesions identified at this
time.

CT ABDOMEN PELVIS FINDINGS

Hepatobiliary: No focal liver abnormality. Gallbladder unremarkable.
No biliary dilatation.

Pancreas: Unremarkable. No pancreatic ductal dilatation or
surrounding inflammatory changes.

Spleen: Normal in size without focal abnormality.

Adrenals/Urinary Tract: Normal appearance of the adrenal glands. The
right kidney is normal. The left kidney is normal. No mass or
hydronephrosis identified. Urinary bladder negative.

Stomach/Bowel: The stomach is unremarkable. The small bowel loops
appear normal. Normal appearance of the colon.

Vascular/Lymphatic: Aortic atherosclerosis. No aneurysm. No
abdominal adenopathy. No pelvic or inguinal adenopathy.

Reproductive: Status post hysterectomy. No adnexal masses.

Other: Well-circumscribed soft tissue attenuating solid nodules in
the right lower quadrant mesentery are again noted. The larger
nodule measures 3.6 x by 2.6 by 3.3 (volume = 16), image 85/2.
Previously 3.3 x 2.9 by 3.6 cm (volume = 18 cm^3). Adjacent,
smaller nodule measures 2.7 x 2.0 by 2.5 cm (volume = 7.1 cm^3),
image 85/2. Previously 2.4 x 2.3 by 2.4 cm (volume = 6.9 cm^3). No
new sites of disease identified.

Musculoskeletal: No acute or significant osseous findings.
IMPRESSION: 1. No significant interval change in the appearance of right lower
quadrant DOTATATE avid mesenteric soft tissue nodules.
2. No new sites of disease.
3. Aortic atherosclerosis.
4. Enlarged main pulmonary artery, which may be seen with pulmonary
arterial hypertension.

Aortic Atherosclerosis (IFABT-382.2).

## 2020-11-14 ENCOUNTER — Other Ambulatory Visit: Payer: Medicare HMO

## 2020-11-20 ENCOUNTER — Telehealth: Payer: Self-pay | Admitting: Oncology

## 2020-11-20 NOTE — Telephone Encounter (Signed)
Pt left vm to cancel appt for 11/21/20 and 11/23/20 appts..  Requesting call back to reschedule.  Routing to scheduler for follow up.

## 2020-11-21 ENCOUNTER — Inpatient Hospital Stay: Payer: Medicare HMO

## 2020-11-23 ENCOUNTER — Inpatient Hospital Stay: Payer: Medicare HMO | Admitting: Oncology

## 2020-11-27 ENCOUNTER — Other Ambulatory Visit: Payer: Self-pay | Admitting: Family Medicine

## 2020-11-27 DIAGNOSIS — M109 Gout, unspecified: Secondary | ICD-10-CM

## 2020-11-30 ENCOUNTER — Inpatient Hospital Stay: Payer: Medicare HMO | Attending: Oncology

## 2020-11-30 DIAGNOSIS — D509 Iron deficiency anemia, unspecified: Secondary | ICD-10-CM | POA: Insufficient documentation

## 2020-11-30 DIAGNOSIS — E785 Hyperlipidemia, unspecified: Secondary | ICD-10-CM | POA: Diagnosis not present

## 2020-11-30 DIAGNOSIS — E1122 Type 2 diabetes mellitus with diabetic chronic kidney disease: Secondary | ICD-10-CM | POA: Diagnosis not present

## 2020-11-30 DIAGNOSIS — G4700 Insomnia, unspecified: Secondary | ICD-10-CM | POA: Diagnosis not present

## 2020-11-30 DIAGNOSIS — Z8673 Personal history of transient ischemic attack (TIA), and cerebral infarction without residual deficits: Secondary | ICD-10-CM | POA: Diagnosis not present

## 2020-11-30 DIAGNOSIS — K219 Gastro-esophageal reflux disease without esophagitis: Secondary | ICD-10-CM | POA: Diagnosis not present

## 2020-11-30 DIAGNOSIS — C7B01 Secondary carcinoid tumors of distant lymph nodes: Secondary | ICD-10-CM | POA: Insufficient documentation

## 2020-11-30 DIAGNOSIS — E538 Deficiency of other specified B group vitamins: Secondary | ICD-10-CM | POA: Diagnosis not present

## 2020-11-30 DIAGNOSIS — Z79899 Other long term (current) drug therapy: Secondary | ICD-10-CM | POA: Diagnosis not present

## 2020-11-30 DIAGNOSIS — I129 Hypertensive chronic kidney disease with stage 1 through stage 4 chronic kidney disease, or unspecified chronic kidney disease: Secondary | ICD-10-CM | POA: Diagnosis not present

## 2020-11-30 DIAGNOSIS — E039 Hypothyroidism, unspecified: Secondary | ICD-10-CM | POA: Diagnosis not present

## 2020-11-30 DIAGNOSIS — E669 Obesity, unspecified: Secondary | ICD-10-CM | POA: Diagnosis not present

## 2020-11-30 DIAGNOSIS — N189 Chronic kidney disease, unspecified: Secondary | ICD-10-CM | POA: Insufficient documentation

## 2020-11-30 DIAGNOSIS — C7A019 Malignant carcinoid tumor of the small intestine, unspecified portion: Secondary | ICD-10-CM | POA: Diagnosis not present

## 2020-11-30 DIAGNOSIS — I480 Paroxysmal atrial fibrillation: Secondary | ICD-10-CM | POA: Diagnosis not present

## 2020-11-30 DIAGNOSIS — D649 Anemia, unspecified: Secondary | ICD-10-CM

## 2020-11-30 DIAGNOSIS — C7B00 Secondary carcinoid tumors, unspecified site: Secondary | ICD-10-CM

## 2020-11-30 LAB — FERRITIN: Ferritin: 6 ng/mL — ABNORMAL LOW (ref 11–307)

## 2020-11-30 LAB — VITAMIN B12: Vitamin B-12: 229 pg/mL (ref 180–914)

## 2020-11-30 LAB — CBC WITH DIFFERENTIAL/PLATELET
Abs Immature Granulocytes: 0.04 10*3/uL (ref 0.00–0.07)
Basophils Absolute: 0 10*3/uL (ref 0.0–0.1)
Basophils Relative: 0 %
Eosinophils Absolute: 0.2 10*3/uL (ref 0.0–0.5)
Eosinophils Relative: 2 %
HCT: 33.9 % — ABNORMAL LOW (ref 36.0–46.0)
Hemoglobin: 10.1 g/dL — ABNORMAL LOW (ref 12.0–15.0)
Immature Granulocytes: 0 %
Lymphocytes Relative: 31 %
Lymphs Abs: 2.9 10*3/uL (ref 0.7–4.0)
MCH: 24.2 pg — ABNORMAL LOW (ref 26.0–34.0)
MCHC: 29.8 g/dL — ABNORMAL LOW (ref 30.0–36.0)
MCV: 81.1 fL (ref 80.0–100.0)
Monocytes Absolute: 0.7 10*3/uL (ref 0.1–1.0)
Monocytes Relative: 7 %
Neutro Abs: 5.5 10*3/uL (ref 1.7–7.7)
Neutrophils Relative %: 60 %
Platelets: 328 10*3/uL (ref 150–400)
RBC: 4.18 MIL/uL (ref 3.87–5.11)
RDW: 17.6 % — ABNORMAL HIGH (ref 11.5–15.5)
WBC: 9.2 10*3/uL (ref 4.0–10.5)
nRBC: 0 % (ref 0.0–0.2)

## 2020-11-30 LAB — TSH: TSH: 4.163 u[IU]/mL (ref 0.350–4.500)

## 2020-11-30 LAB — FOLATE: Folate: 15.3 ng/mL (ref >5.9)

## 2020-11-30 LAB — IRON AND TIBC
Iron: 27 ug/dL — ABNORMAL LOW (ref 28–170)
Saturation Ratios: 6 % — ABNORMAL LOW (ref 10.4–31.8)
TIBC: 459 ug/dL — ABNORMAL HIGH (ref 250–450)
UIBC: 432 ug/dL

## 2020-11-30 LAB — RETICULOCYTES
Immature Retic Fract: 30.1 % — ABNORMAL HIGH (ref 2.3–15.9)
RBC.: 4.11 MIL/uL (ref 3.87–5.11)
Retic Count, Absolute: 65.3 10*3/uL (ref 19.0–186.0)
Retic Ct Pct: 1.6 % (ref 0.4–3.1)

## 2020-12-01 LAB — HAPTOGLOBIN: Haptoglobin: 167 mg/dL (ref 41–333)

## 2020-12-02 ENCOUNTER — Other Ambulatory Visit: Payer: Self-pay | Admitting: Family Medicine

## 2020-12-02 DIAGNOSIS — E1121 Type 2 diabetes mellitus with diabetic nephropathy: Secondary | ICD-10-CM

## 2020-12-02 DIAGNOSIS — E1169 Type 2 diabetes mellitus with other specified complication: Secondary | ICD-10-CM

## 2020-12-02 DIAGNOSIS — E785 Hyperlipidemia, unspecified: Secondary | ICD-10-CM

## 2020-12-04 ENCOUNTER — Telehealth: Payer: Self-pay | Admitting: Oncology

## 2020-12-04 ENCOUNTER — Encounter: Payer: Self-pay | Admitting: Oncology

## 2020-12-04 ENCOUNTER — Inpatient Hospital Stay: Payer: Medicare HMO

## 2020-12-04 ENCOUNTER — Inpatient Hospital Stay (HOSPITAL_BASED_OUTPATIENT_CLINIC_OR_DEPARTMENT_OTHER): Payer: Medicare HMO | Admitting: Oncology

## 2020-12-04 VITALS — BP 132/72 | HR 81 | Temp 97.4°F | Resp 18 | Wt 199.5 lb

## 2020-12-04 VITALS — BP 140/61 | HR 73 | Temp 97.1°F | Resp 20

## 2020-12-04 DIAGNOSIS — C7B00 Secondary carcinoid tumors, unspecified site: Secondary | ICD-10-CM | POA: Diagnosis not present

## 2020-12-04 DIAGNOSIS — D508 Other iron deficiency anemias: Secondary | ICD-10-CM

## 2020-12-04 DIAGNOSIS — K219 Gastro-esophageal reflux disease without esophagitis: Secondary | ICD-10-CM | POA: Diagnosis not present

## 2020-12-04 DIAGNOSIS — E538 Deficiency of other specified B group vitamins: Secondary | ICD-10-CM | POA: Diagnosis not present

## 2020-12-04 DIAGNOSIS — D509 Iron deficiency anemia, unspecified: Secondary | ICD-10-CM | POA: Insufficient documentation

## 2020-12-04 DIAGNOSIS — I129 Hypertensive chronic kidney disease with stage 1 through stage 4 chronic kidney disease, or unspecified chronic kidney disease: Secondary | ICD-10-CM | POA: Diagnosis not present

## 2020-12-04 DIAGNOSIS — C7A019 Malignant carcinoid tumor of the small intestine, unspecified portion: Secondary | ICD-10-CM | POA: Diagnosis not present

## 2020-12-04 DIAGNOSIS — C7B01 Secondary carcinoid tumors of distant lymph nodes: Secondary | ICD-10-CM | POA: Diagnosis not present

## 2020-12-04 DIAGNOSIS — Z79899 Other long term (current) drug therapy: Secondary | ICD-10-CM | POA: Diagnosis not present

## 2020-12-04 DIAGNOSIS — N189 Chronic kidney disease, unspecified: Secondary | ICD-10-CM | POA: Diagnosis not present

## 2020-12-04 DIAGNOSIS — E1122 Type 2 diabetes mellitus with diabetic chronic kidney disease: Secondary | ICD-10-CM | POA: Diagnosis not present

## 2020-12-04 MED ORDER — SODIUM CHLORIDE 0.9 % IV SOLN
Freq: Once | INTRAVENOUS | Status: AC
  Filled 2020-12-04: qty 250

## 2020-12-04 MED ORDER — SODIUM CHLORIDE 0.9 % IV SOLN: 200.0000 mg | INTRAVENOUS | Status: DC

## 2020-12-04 MED ORDER — CYANOCOBALAMIN 1000 MCG/ML IJ SOLN
1000.0000 ug | INTRAMUSCULAR | Status: DC
  Administered 2020-12-04: 1000 ug via INTRAMUSCULAR
  Filled 2020-12-04: qty 1

## 2020-12-04 MED ORDER — IRON SUCROSE 20 MG/ML IV SOLN
200.0000 mg | Freq: Once | INTRAVENOUS | Status: AC
  Administered 2020-12-04: 200 mg via INTRAVENOUS
  Filled 2020-12-04: qty 10

## 2020-12-04 NOTE — Progress Notes (Signed)
Dr.    Hematology/Oncology Consult note [facility]  Telephone:(336) [phone] Fax:(336) [phone]  Patient Care Team: Steele Sizer, MD as PCP - General (Family Medicine) Wellington Hampshire, MD as PCP - Cardiology (Cardiology) Sindy Guadeloupe, MD as Consulting Physician (Oncology)   Name of the patient: Susan Bowen  [id number]  07-06-1932   Date of visit: 12/04/20  Diagnosis- 1. intra abdominal adenopathy- metastatic carcinoid on surveillance 2.  Iron and B12 deficiency anemia  Chief complaint/ Reason for visit-routine follow-up of carcinoid anemia  Heme/Onc history: Patient is a 87 year old female who underwent CT renal stone study for flank pain. CT scan showed pathologically enlarged lymph nodes in the right lower quadrant measuring up to 2.6 cm. Findings concerning for lymphoma or malignant process. Patient's past medical history significant for pulmonary hypertension, CKD, hypothyroidism among other medical problems. She did not have any B symptoms. This was followed by a PET CT scan Which showed 3 prominent soft tissue nodule/lymph nodes one in the mesentery at 2.1 cm with an SUV of 2.8. Adjacent soft tissue nodule/lymph node in the right iliac fossa measuring 3.1 cm and SUV of 2.8. Predominantly fat attenuation mesenteric nodule in the left lower quadrant measuring 2 cm without any significant FDG uptake  She remains on surveillance and has not started any somatostatin therapy yet  Interval history-she is doing well overall for her age.  Denies any vaginal bleeding.  Denies any melena  ECOG PS- 1 Pain scale- 0   Review of systems- Review of Systems  Constitutional: Positive for malaise/fatigue. Negative for chills, fever and weight loss.  HENT: Negative for congestion, ear discharge and nosebleeds.   Eyes: Negative for blurred vision.  Respiratory: Negative for cough, hemoptysis, sputum production, shortness of breath and wheezing.    Cardiovascular: Negative for chest pain, palpitations, orthopnea and claudication.  Gastrointestinal: Negative for abdominal pain, blood in stool, constipation, diarrhea, heartburn, melena, nausea and vomiting.  Genitourinary: Negative for dysuria, flank pain, frequency, hematuria and urgency.  Musculoskeletal: Negative for back pain, joint pain and myalgias.  Skin: Negative for rash.  Neurological: Negative for dizziness, tingling, focal weakness, seizures, weakness and headaches.  Endo/Heme/Allergies: Does not bruise/bleed easily.  Psychiatric/Behavioral: Negative for depression and suicidal ideas. The patient does not have insomnia.       Allergies  Allergen Reactions  . Augmentin [Amoxicillin-Pot Clavulanate] Itching    Has patient had a PCN reaction causing immediate rash, facial/tongue/throat swelling, SOB or lightheadedness with hypotension: No Has patient had a PCN reaction causing severe rash involving mucus membranes or skin necrosis: No Has patient had a PCN reaction that required hospitalization: No Has patient had a PCN reaction occurring within the last 10 years: No If all of the above answers are "NO", then may proceed with Cephalosporin use.      Past Medical History:  Diagnosis Date  . Allergy   . Back spasm   . Bunion   . Carpal tunnel syndrome   . Cataracta   . Diabetes mellitus without complication ([place])   . Generalized osteoarthritis   . GERD (gastroesophageal reflux disease)   . Gout   . Hemorrhoids without complication   . Hyperlipidemia   . Hypertension   . Hypothyroidism   . Impingement syndrome of right shoulder   . Insomnia   . Lipoma   . Lung nodule   . Metastatic carcinoid tumor ([place]) 03/2016  . Neuritis or radiculitis due to rupture of lumbar intervertebral disc   . Obesity   .  Osteoporosis   . PAF (paroxysmal atrial fibrillation) ([place])    a. 03/2018 AF w/ RVR-->converted spont.  CHA2DS2VASc = 7-->Eliquis.  . Proteinuria   . Rotator cuff  tear   . Systolic murmur    a. 06/1190 Echo: EF 60-65%, no rwma, Gr1 DD, mild MR, mildly dil LA. Nl RV fxn. PASP 42mHg.  .Marland KitchenTIA (transient ischemic attack)   . TIA (transient ischemic attack) 1998   small TIA.  no residual effects  . Tinnitus of both ears   . Unspecified glaucoma(365.9)   . Vitamin D deficiency   . Wedge compression fracture of t11-T12 vertebra, sequela      Past Surgical History:  Procedure Laterality Date  . ABDOMINAL HYSTERECTOMY  1986  . APPENDECTOMY    . BREAST SURGERY Bilateral 1983   biopsy of each side, negative  . CARPAL TUNNEL RELEASE    . CATARACT EXTRACTION W/PHACO Left 09/03/2017   Procedure: CATARACT EXTRACTION PHACO AND INTRAOCULAR LENS PLACEMENT (IOC)-LEFT DIABETIC;  Surgeon: PBirder Robson MD;  Location: [facility] ORS;  Service: Ophthalmology;  Laterality: Left;  [place]:10.2 AP% 16.7 CDE 11.71 Fluid Pack Lot # [id number] . CATARACT EXTRACTION W/PHACO Right 09/30/2017   Procedure: CATARACT EXTRACTION PHACO AND INTRAOCULAR LENS PLACEMENT (IOC);  Surgeon: PBirder Robson MD;  Location: [facility] ORS;  Service: Ophthalmology;  Laterality: Right;  [place]:35.1 AP% 13.1 CDE 12.44 Fluid pack Lot # [id number]  . COLON SURGERY    . EYE SURGERY Left 09/03/2017   Cataract extraction  . FEMUR FRACTURE SURGERY Left   . FRACTURE SURGERY  2001   left femur  . HEMORROIDECTOMY    . polyp removed from vocal cord      Social History   Socioeconomic History  . Marital status: Divorced    Spouse name: Not on file  . Number of children: 6  . Years of education: Not on file  . Highest education level: 10th grade  Occupational History  . Not on file  Tobacco Use  . Smoking status: Never Smoker  . Smokeless tobacco: Never Used  Vaping Use  . Vaping Use: Never used  Substance and Sexual Activity  . Alcohol use: No    Alcohol/week: 0.0 standard drinks  . Drug use: No  . Sexual activity: Not Currently  Other Topics Concern  . Not on file  Social History  Narrative   Pt lives with her son; independent ADL's, no longer drives   Social Determinants of Health   Financial Resource Strain: Low Risk   . Difficulty of Paying Living Expenses: Not very hard  Food Insecurity: No Food Insecurity  . Worried About RCharity fundraiserin the Last Year: Never true  . Ran Out of Food in the Last Year: Never true  Transportation Needs: No Transportation Needs  . Lack of Transportation (Medical): No  . Lack of Transportation (Non-Medical): No  Physical Activity: Insufficiently Active  . Days of Exercise per Week: 7 days  . Minutes of Exercise per Session: 10 min  Stress: No Stress Concern Present  . Feeling of Stress : Not at all  Social Connections: Moderately Integrated  . Frequency of Communication with Friends and Family: More than three times a week  . Frequency of Social Gatherings with Friends and Family: Three times a week  . Attends Religious Services: More than 4 times per year  . Active Member of Clubs or Organizations: Yes  . Attends CArchivistMeetings: 1 to 4 times per year  .  Marital Status: Divorced  Human resources officer Violence: Not At Risk  . Fear of Current or Ex-Partner: No  . Emotionally Abused: No  . Physically Abused: No  . Sexually Abused: No    Family History  Problem Relation Age of Onset  . Diabetes Mother   . Hypertension Mother   . Dementia Mother   . Hypertension Son      Current Outpatient Medications:  .  acetaminophen-codeine (TYLENOL #3) 300-30 MG tablet, TAKE 1 TABLET BY MOUTH EVERY 6 HOURS AS NEEDED FOR CHRONIC PAIN, Disp: 30 tablet, Rfl: 0 .  allopurinol (ZYLOPRIM) 100 MG tablet, TAKE 1 TABLET BY MOUTH EVERY DAY, Disp: 90 tablet, Rfl: 1 .  amLODipine (NORVASC) 10 MG tablet, Take 1 tablet (10 mg total) by mouth daily., Disp: 90 tablet, Rfl: 3 .  Blood Glucose Monitoring Suppl (ACCU-CHEK AVIVA PLUS) w/Device KIT, 1 kit by Does not apply route 3 (three) times daily., Disp: 1 kit, Rfl: 0 .   carvedilol (COREG) 25 MG tablet, TAKE 1 TABLET (25 MG TOTAL) BY MOUTH 2 (TWO) TIMES DAILY. NEW DOSE, Disp: 180 tablet, Rfl: 1 .  clotrimazole-betamethasone (LOTRISONE) cream, Apply 1 application topically 2 (two) times daily as needed. To affected skin, Disp: 15 g, Rfl: 1 .  COMBIGAN 0.2-0.5 % ophthalmic solution, Place 1 drop into both eyes 2 (two) times daily., Disp: , Rfl:  .  Cyanocobalamin (B-12) 1000 MCG TABS, Take 1,000 mcg daily by mouth., Disp: , Rfl:  .  diclofenac Sodium (VOLTAREN) 1 % GEL, Apply 2 g topically 3 (three) times daily as needed. , Disp: , Rfl:  .  Dulaglutide (TRULICITY) 3 KR/8.3KF SOPN, Inject 3 mg as directed once a week., Disp: 12 mL, Rfl: 1 .  ELIQUIS 5 MG TABS tablet, TAKE 1 TABLET BY MOUTH TWICE A DAY, Disp: 180 tablet, Rfl: 1 .  glucose blood (ACCU-CHEK AVIVA PLUS) test strip, Check fasting glucose once each morning, and check glucose as needed., Disp: 100 each, Rfl: 12 .  JARDIANCE 25 MG TABS tablet, TAKE 25 MG BY MOUTH DAILY., Disp: 90 tablet, Rfl: 1 .  Lancets (ACCU-CHEK MULTICLIX) lancets, Use as instructed, Disp: 204 each, Rfl: 12 .  levothyroxine (SYNTHROID) 50 MCG tablet, Take 1 tablet (50 mcg total) by mouth daily before breakfast., Disp: 90 tablet, Rfl: 1 .  loratadine (CLARITIN) 10 MG tablet, TAKE 1 TABLET BY MOUTH EVERY DAY, Disp: 90 tablet, Rfl: 2 .  metFORMIN (GLUCOPHAGE) 850 MG tablet, TAKE 1 TABLET (850 MG TOTAL) BY MOUTH 2 (TWO) TIMES DAILY., Disp: 180 tablet, Rfl: 1 .  MITIGARE 0.6 MG CAPS, TAKE 1-2 CAPSULES (0.6-1.2 MG TOTAL) BY MOUTH DAILY AS NEEDED. FOR GOUT, NOT DAILY, Disp: 30 capsule, Rfl: 0 .  nystatin (MYCOSTATIN/NYSTOP) powder, Apply 1 application topically 2 (two) times daily as needed., Disp: 30 g, Rfl: 0 .  olmesartan (BENICAR) 40 MG tablet, Take 1 tablet (40 mg total) by mouth daily., Disp: 90 tablet, Rfl: 1 .  pravastatin (PRAVACHOL) 40 MG tablet, TAKE 1 TABLET BY MOUTH EVERY DAY, Disp: 90 tablet, Rfl: 1 .  Travoprost, BAK Free,  (TRAVATAN) 0.004 % SOLN ophthalmic solution, Place 1 drop into both eyes at bedtime., Disp: , Rfl:  .  Vitamin D, Ergocalciferol, (DRISDOL) 1.25 MG (50000 UNIT) CAPS capsule, TAKE 1 CAPSULE BY MOUTH WEEKLY, Disp: 12 capsule, Rfl: 1 .  traZODone (DESYREL) 50 MG tablet, TAKE 0.5-1 TABLETS (25-50 MG TOTAL) BY MOUTH AT BEDTIME AS NEEDED FOR SLEEP. (Patient not taking: Reported on 12/04/2020), Disp:  90 tablet, Rfl: 0 No current facility-administered medications for this visit.  Facility-Administered Medications Ordered in Other Visits:  .  cyanocobalamin ((VITAMIN B-12)) injection 1,000 mcg, 1,000 mcg, Intramuscular, Q30 days, Sindy Guadeloupe, MD, 1,000 mcg at 12/04/20 1122  Physical exam:  Vitals:   12/04/20 1008  BP: 132/72  Pulse: 81  Resp: 18  Temp: (!) 97.4 F (36.3 C)  TempSrc: Tympanic  SpO2: 97%  Weight: 199 lb 8 oz (90.5 kg)   Physical Exam Eyes:     Extraocular Movements: EOM normal.  Cardiovascular:     Rate and Rhythm: Normal rate and regular rhythm.     Heart sounds: Normal heart sounds.  Pulmonary:     Effort: Pulmonary effort is normal.     Breath sounds: Normal breath sounds.  Abdominal:     General: Bowel sounds are normal.     Palpations: Abdomen is soft.  Skin:    General: Skin is warm and dry.  Neurological:     Mental Status: She is alert and oriented to person, place, and time.      CMP Latest Ref Rng & Units 09/08/2020  Glucose 70 - 99 mg/dL 144(H)  BUN 8 - 23 mg/dL 8  Creatinine 0.44 - 1.00 mg/dL 0.95  Sodium 135 - 145 mmol/L 141  Potassium 3.5 - 5.1 mmol/L 3.8  Chloride 98 - 111 mmol/L 108  CO2 22 - 32 mmol/L 26  Calcium 8.9 - 10.3 mg/dL 9.1  Total Protein 6.5 - 8.1 g/dL 6.6  Total Bilirubin 0.3 - 1.2 mg/dL 0.4  Alkaline Phos 38 - 126 U/L 51  AST 15 - 41 U/L 13(L)  ALT 0 - 44 U/L 12   CBC Latest Ref Rng & Units 11/30/2020  WBC 4.0 - 10.5 K/uL 9.2  Hemoglobin 12.0 - 15.0 g/dL 10.1(L)  Hematocrit 36.0 - 46.0 % 33.9(L)  Platelets 150 - 400 K/uL  328      Assessment and plan- Patient is a 87 y.o. female who is here for follow-up of following issues:  1.  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  Scans have been stable overall for the last 1 year.  Repeat CT chest abdomen and pelvis with contrast in 6 months.  2.  Iron B12 deficiency anemia: Patient's hemoglobin is drifted down from 12-10 over the last 1 year.  Recent iron studies are indicative of iron deficiency as well as B12 deficiency.  Her last colonoscopy was in 2008.  Patient denies any rectal or vaginal bleeding.  Denies any melena.  She would not like to pursue GI evaluation at this time.  Discussed risks and benefits of Venofer including all but not limited to headache, leg swelling and possible risk of infusion reaction.  Patient understands and agrees to proceed as planned  I will proceed with 5 doses of Venofer 200 mg IV weekly given over the next 3 weeks.  We will also give her B12 shot today and a second B12 shot with the fifth dose of benefit.  She will continue to take oral B12 at home.  CBC ferritin iron studies and B12 in 3 in 6 months and I will see her back in 6 months with scans prior.   Visit Diagnosis 1. Metastatic carcinoid tumor ([place])   2. Other iron deficiency anemia   3. B12 deficiency      Dr. Randa Evens, MD,  [facility] [phone] 12/04/2020 1:29 PM

## 2020-12-04 NOTE — Progress Notes (Signed)
Patient tolerated infusion well. Monitored x 30 minutes post Venofer treatment. Discharged home.

## 2020-12-04 NOTE — Telephone Encounter (Signed)
Left VM to notify pt of her CT Scan scheduled on 06/13/21.

## 2020-12-09 ENCOUNTER — Other Ambulatory Visit: Payer: Self-pay | Admitting: Family Medicine

## 2020-12-09 DIAGNOSIS — I10 Essential (primary) hypertension: Secondary | ICD-10-CM

## 2020-12-11 ENCOUNTER — Inpatient Hospital Stay: Payer: Medicare HMO | Attending: Oncology

## 2020-12-11 VITALS — BP 137/67 | HR 79 | Temp 97.7°F | Resp 16

## 2020-12-11 DIAGNOSIS — D508 Other iron deficiency anemias: Secondary | ICD-10-CM

## 2020-12-11 DIAGNOSIS — Z79899 Other long term (current) drug therapy: Secondary | ICD-10-CM | POA: Diagnosis not present

## 2020-12-11 DIAGNOSIS — D509 Iron deficiency anemia, unspecified: Secondary | ICD-10-CM | POA: Insufficient documentation

## 2020-12-11 MED ORDER — SODIUM CHLORIDE 0.9 % IV SOLN: 200.0000 mg | INTRAVENOUS | Status: DC

## 2020-12-11 MED ORDER — SODIUM CHLORIDE 0.9 % IV SOLN
Freq: Once | INTRAVENOUS | Status: AC
  Filled 2020-12-11: qty 250

## 2020-12-11 MED ORDER — IRON SUCROSE 20 MG/ML IV SOLN
200.0000 mg | Freq: Once | INTRAVENOUS | Status: AC
  Administered 2020-12-11: 200 mg via INTRAVENOUS
  Filled 2020-12-11: qty 10

## 2020-12-11 NOTE — Progress Notes (Signed)
Patient tolerated treatment well. Patient and VSS. Discharged home  

## 2020-12-13 ENCOUNTER — Inpatient Hospital Stay: Payer: Medicare HMO

## 2020-12-13 ENCOUNTER — Other Ambulatory Visit: Payer: Self-pay

## 2020-12-13 VITALS — BP 138/72 | HR 76

## 2020-12-13 DIAGNOSIS — Z79899 Other long term (current) drug therapy: Secondary | ICD-10-CM | POA: Diagnosis not present

## 2020-12-13 DIAGNOSIS — D509 Iron deficiency anemia, unspecified: Secondary | ICD-10-CM | POA: Diagnosis not present

## 2020-12-13 DIAGNOSIS — D508 Other iron deficiency anemias: Secondary | ICD-10-CM

## 2020-12-13 MED ORDER — SODIUM CHLORIDE 0.9 % IV SOLN
Freq: Once | INTRAVENOUS | Status: AC
  Filled 2020-12-13: qty 250

## 2020-12-13 MED ORDER — SODIUM CHLORIDE 0.9 % IV SOLN: 200.0000 mg | INTRAVENOUS | Status: DC

## 2020-12-13 MED ORDER — IRON SUCROSE 20 MG/ML IV SOLN
200.0000 mg | Freq: Once | INTRAVENOUS | Status: AC
  Administered 2020-12-13: 200 mg via INTRAVENOUS
  Filled 2020-12-13: qty 10

## 2020-12-18 ENCOUNTER — Inpatient Hospital Stay: Payer: Medicare HMO

## 2020-12-18 VITALS — BP 149/64 | HR 75 | Temp 98.5°F | Resp 18

## 2020-12-18 DIAGNOSIS — D509 Iron deficiency anemia, unspecified: Secondary | ICD-10-CM | POA: Diagnosis not present

## 2020-12-18 DIAGNOSIS — D508 Other iron deficiency anemias: Secondary | ICD-10-CM

## 2020-12-18 DIAGNOSIS — Z79899 Other long term (current) drug therapy: Secondary | ICD-10-CM | POA: Diagnosis not present

## 2020-12-18 MED ORDER — IRON SUCROSE 20 MG/ML IV SOLN
200.0000 mg | Freq: Once | INTRAVENOUS | Status: AC
  Administered 2020-12-18: 200 mg via INTRAVENOUS
  Filled 2020-12-18: qty 10

## 2020-12-18 MED ORDER — SODIUM CHLORIDE 0.9 % IV SOLN
Freq: Once | INTRAVENOUS | Status: AC
  Filled 2020-12-18: qty 250

## 2020-12-18 MED ORDER — SODIUM CHLORIDE 0.9 % IV SOLN: 200.0000 mg | INTRAVENOUS | Status: DC

## 2020-12-18 NOTE — Progress Notes (Signed)
1452- Patient tolerated Venofer infusion well. Patient and vital signs stable. Patient discharged to home at this time.

## 2020-12-20 ENCOUNTER — Inpatient Hospital Stay: Payer: Medicare HMO

## 2020-12-20 VITALS — BP 148/80 | HR 69 | Temp 96.0°F | Resp 18

## 2020-12-20 DIAGNOSIS — D509 Iron deficiency anemia, unspecified: Secondary | ICD-10-CM | POA: Diagnosis not present

## 2020-12-20 DIAGNOSIS — D508 Other iron deficiency anemias: Secondary | ICD-10-CM

## 2020-12-20 DIAGNOSIS — Z79899 Other long term (current) drug therapy: Secondary | ICD-10-CM | POA: Diagnosis not present

## 2020-12-20 MED ORDER — IRON SUCROSE 20 MG/ML IV SOLN
200.0000 mg | Freq: Once | INTRAVENOUS | Status: AC
  Administered 2020-12-20: 200 mg via INTRAVENOUS
  Filled 2020-12-20: qty 10

## 2020-12-20 MED ORDER — CYANOCOBALAMIN 1000 MCG/ML IJ SOLN
1000.0000 ug | INTRAMUSCULAR | Status: AC
  Administered 2020-12-20: 1000 ug via INTRAMUSCULAR
  Filled 2020-12-20: qty 1

## 2020-12-20 MED ORDER — SODIUM CHLORIDE 0.9 % IV SOLN
Freq: Once | INTRAVENOUS | Status: AC
  Filled 2020-12-20: qty 250

## 2020-12-20 MED ORDER — SODIUM CHLORIDE 0.9 % IV SOLN: 200.0000 mg | INTRAVENOUS | Status: DC

## 2021-01-09 NOTE — Progress Notes (Signed)
Name: Susan Bowen   MRN: [id number]    DOB: Jul 01, 1932   Date:01/10/2021       Progress Note  Subjective  Chief Complaint  Follow Up  HPI  Thyroid mass: US done in 10/2020 showed  IMPRESSION: 1. Atrophic and moderately heterogeneous thyroid without worrisome new or enlarging thyroid nodules 2. Dominant right-sided thyroid nodule has decreased in size compared to the 2015 examination. Imaging stability for greater than 5 years is indicative of benign etiology. 3. None of the other discretely measured nodules/pseudo nodules meet imaging criteria to recommend percutaneous sampling or continued dedicated follow-up.  Intertrigo and pruritis; she states worse on inguinal area, but also under breast, axilla, using topical medications but the pruritis is significant, we will give her anti-itchy medication and refer to dermatologist   DMII with microalbuminuria; she is taking medications,ARB and statin therapy. She has not been checking her glucose lately  She has occasional polyphagia, polydipsia but nopolyuria. Her hgbA1C was 10.5%, dropped to8.5% 7.8%,7.5% , up to 7.8%,8 % ,  7.8 % , today it is down to 6.3 %. She has been taking Trulicity and , Metformin, Jardiance.   HTN: she has been taking medication. No chest pain, but occasionally has heart flutter. HCTZ has been stopped because of gout, bp today has improved . She is on NOrvasc, carvedilol and Benicar given by cardiologist.   Afib : under the care of Dr. Fletcher Anon, rate controlled,she hassob with activity but very seldom, she still has some palpitation intermittent that happens usually at night but lasts only a few seconds.On Eliquis, denies any bleeding. Continue medication   Hyperlipidemia: taking Pravastatin and denies side effects of medications, no myalgia. She will have labs done today   Pulmonary nodule: seen by Dr. Raul Del, lung nodule stableand finished CT in 2017 .She is no longer seeing him. She is now  seeing Dr. Janese Banks and had a CT chest done 08/2019. CT showed possible pulmonary hypertension. Pulmonary nodules unchanged since 2017.   Aorta atherosclerosis on CT chest: on statin and Eliquis, unchanged   Carcinoid tumor with metastasis: incidental finding onCT abdomen done for evaluation of renal stone back in 03/26/2019, it showed retroperitoneal lymphadenopathy, she has since seen Dr. Janese Banks and has been diagnosed with carcinoid tumor with metastasis and going to have watchful waiting. since she has been asymptomatic.   Weight loss: she continues to lose weight, she has cancer, discussed protein intake  Anemia and B12 deficiency: under the care of Dr. Janese Banks    Patient Active Problem List   Diagnosis Date Noted  . Iron deficiency anemia 12/04/2020  . Carcinoid tumor, malignant ([place]) 09/08/2019  . Pain in joint of left shoulder 02/25/2019  . Pulmonary hypertension, unspecified ([place]) 12/28/2018  . Paroxysmal A-fib ([place]) 12/28/2018  . Chronic kidney disease, stage III (moderate) ([place]) 04/01/2018  . Elevated parathyroid hormone 01/01/2018  . Elevated uric acid in blood 04/29/2017  . Plantar fasciitis of left foot 03/12/2016  . Postprocedural hypothyroidism 01/23/2016  . Aortic stenosis 09/26/2015  . Allergic rhinitis, seasonal 08/08/2015  . Benign hypertension 08/08/2015  . History of pneumonia 08/08/2015  . Carpal tunnel syndrome 08/08/2015  . Cataract 08/08/2015  . Insomnia, persistent 08/08/2015  . Dyslipidemia 08/08/2015  . History of depression 08/08/2015  . Glaucoma 08/08/2015  . Controlled gout 08/08/2015  . Fever blister 08/08/2015  . Bilateral hearing loss 08/08/2015  . Hemorrhoid 08/08/2015  . H/O iron deficiency anemia 08/08/2015  . Benign neoplasm of colon 08/08/2015  . Impingement syndrome  of shoulder 08/08/2015  . Osteoporosis, post-menopausal 08/08/2015  . Morbid obesity ([place]) 08/08/2015  . Generalized OA 08/08/2015  . Multinodular goiter 08/08/2015  .  Asymptomatic varicose veins 08/08/2015  . Polyp of vocal cord 08/08/2015  . History of vertebral compression fracture 08/08/2015  . Diabetes mellitus with proteinuric diabetic nephropathy ([place]) 08/08/2015  . LVH (left ventricular hypertrophy) 08/08/2015  . Moderate tricuspid regurgitation 08/08/2015  . History of radioactive iodine thyroid ablation 08/08/2015  . Lung nodule, solitary 05/12/2014  . Chronic cough 05/11/2014  . Vitamin D deficiency 12/20/2009  . Plantar fascial fibromatosis 12/20/2009  . Personal history of fall 07/20/2008  . Cervical radiculitis 05/11/2008    Past Surgical History:  Procedure Laterality Date  . ABDOMINAL HYSTERECTOMY  1986  . APPENDECTOMY    . BREAST SURGERY Bilateral 1983   biopsy of each side, negative  . CARPAL TUNNEL RELEASE    . CATARACT EXTRACTION W/PHACO Left 09/03/2017   Procedure: CATARACT EXTRACTION PHACO AND INTRAOCULAR LENS PLACEMENT (IOC)-LEFT DIABETIC;  Surgeon: Birder Robson, MD;  Location: [facility] ORS;  Service: Ophthalmology;  Laterality: Left;  [place] 01:10.2 AP% 16.7 CDE 11.71 Fluid Pack Lot # [id number]  . CATARACT EXTRACTION W/PHACO Right 09/30/2017   Procedure: CATARACT EXTRACTION PHACO AND INTRAOCULAR LENS PLACEMENT (IOC);  Surgeon: Birder Robson, MD;  Location: [facility] ORS;  Service: Ophthalmology;  Laterality: Right;  [place] 01:35.1 AP% 13.1 CDE 12.44 Fluid pack Lot # [id number]  . COLON SURGERY    . EYE SURGERY Left 09/03/2017   Cataract extraction  . FEMUR FRACTURE SURGERY Left   . FRACTURE SURGERY  2001   left femur  . HEMORROIDECTOMY    . polyp removed from vocal cord      Family History  Problem Relation Age of Onset  . Diabetes Mother   . Hypertension Mother   . Dementia Mother   . Hypertension Son     Social History   Tobacco Use  . Smoking status: Never Smoker  . Smokeless tobacco: Never Used  Substance Use Topics  . Alcohol use: No    Alcohol/week: 0.0 standard drinks     Current Outpatient  Medications:  .  allopurinol (ZYLOPRIM) 100 MG tablet, TAKE 1 TABLET BY MOUTH EVERY DAY, Disp: 90 tablet, Rfl: 1 .  amLODipine (NORVASC) 10 MG tablet, Take 1 tablet (10 mg total) by mouth daily., Disp: 90 tablet, Rfl: 3 .  Blood Glucose Monitoring Suppl (ACCU-CHEK AVIVA PLUS) w/Device KIT, 1 kit by Does not apply route 3 (three) times daily., Disp: 1 kit, Rfl: 0 .  COMBIGAN 0.2-0.5 % ophthalmic solution, Place 1 drop into both eyes 2 (two) times daily., Disp: , Rfl:  .  Dulaglutide (TRULICITY) 3 EV/0.3JK SOPN, Inject 3 mg as directed once a week., Disp: 12 mL, Rfl: 1 .  ELIQUIS 5 MG TABS tablet, TAKE 1 TABLET BY MOUTH TWICE A DAY, Disp: 180 tablet, Rfl: 1 .  glucose blood (ACCU-CHEK AVIVA PLUS) test strip, Check fasting glucose once each morning, and check glucose as needed., Disp: 100 each, Rfl: 12 .  hydrOXYzine (ATARAX/VISTARIL) 10 MG tablet, Take 1 tablet (10 mg total) by mouth at bedtime., Disp: 30 tablet, Rfl: 0 .  JARDIANCE 25 MG TABS tablet, TAKE 25 MG BY MOUTH DAILY., Disp: 90 tablet, Rfl: 1 .  Lancets (ACCU-CHEK MULTICLIX) lancets, Use as instructed, Disp: 204 each, Rfl: 12 .  levothyroxine (SYNTHROID) 50 MCG tablet, Take 1 tablet (50 mcg total) by mouth daily before breakfast., Disp: 90 tablet, Rfl: 1 .  metFORMIN (GLUCOPHAGE) 850 MG tablet, TAKE 1 TABLET (850 MG TOTAL) BY MOUTH 2 (TWO) TIMES DAILY., Disp: 180 tablet, Rfl: 1 .  MITIGARE 0.6 MG CAPS, TAKE 1-2 CAPSULES (0.6-1.2 MG TOTAL) BY MOUTH DAILY AS NEEDED. FOR GOUT, NOT DAILY, Disp: 30 capsule, Rfl: 0 .  nystatin (MYCOSTATIN/NYSTOP) powder, Apply 1 application topically 2 (two) times daily as needed., Disp: 30 g, Rfl: 0 .  pravastatin (PRAVACHOL) 40 MG tablet, TAKE 1 TABLET BY MOUTH EVERY DAY, Disp: 90 tablet, Rfl: 1 .  Travoprost, BAK Free, (TRAVATAN) 0.004 % SOLN ophthalmic solution, Place 1 drop into both eyes at bedtime., Disp: , Rfl:  .  Vitamin D, Ergocalciferol, (DRISDOL) 1.25 MG (50000 UNIT) CAPS capsule, TAKE 1 CAPSULE BY  MOUTH WEEKLY, Disp: 12 capsule, Rfl: 1 .  acetaminophen-codeine (TYLENOL #3) 300-30 MG tablet, TAKE 1 TABLET BY MOUTH EVERY 6 HOURS AS NEEDED FOR CHRONIC PAIN, Disp: 30 tablet, Rfl: 0 .  carvedilol (COREG) 25 MG tablet, Take 1 tablet (25 mg total) by mouth 2 (two) times daily. New dose, Disp: 180 tablet, Rfl: 1 .  clotrimazole-betamethasone (LOTRISONE) cream, Apply 1 application topically 2 (two) times daily as needed. To affected skin, Disp: 45 g, Rfl: 0 .  diclofenac Sodium (VOLTAREN) 1 % GEL, Apply 2 g topically 3 (three) times daily as needed., Disp: 100 g, Rfl: 1 .  loratadine (CLARITIN) 10 MG tablet, Take 1 tablet (10 mg total) by mouth daily., Disp: 90 tablet, Rfl: 1 .  olmesartan (BENICAR) 40 MG tablet, Take 1 tablet (40 mg total) by mouth daily., Disp: 90 tablet, Rfl: 1 No current facility-administered medications for this visit.  Facility-Administered Medications Ordered in Other Visits:  .  cyanocobalamin ((VITAMIN B-12)) injection 1,000 mcg, 1,000 mcg, Intramuscular, Q30 days, Sindy Guadeloupe, MD, 1,000 mcg at 12/20/20 1414  Allergies  Allergen Reactions  . Augmentin [Amoxicillin-Pot Clavulanate] Itching    Has patient had a PCN reaction causing immediate rash, facial/tongue/throat swelling, SOB or lightheadedness with hypotension: No Has patient had a PCN reaction causing severe rash involving mucus membranes or skin necrosis: No Has patient had a PCN reaction that required hospitalization: No Has patient had a PCN reaction occurring within the last 10 years: No If all of the above answers are "NO", then may proceed with Cephalosporin use.     I personally reviewed active problem list, medication list, allergies, family history, social history, health maintenance with the patient/caregiver today.   ROS  Constitutional: Negative for fever or weight change.  Respiratory: Negative for cough and shortness of breath.   Cardiovascular: Negative for chest pain or palpitations.   Gastrointestinal: Negative for abdominal pain, no bowel changes.  Musculoskeletal: Negative for gait problem or joint swelling.  Skin: positive  for rash.  Neurological: Negative for dizziness or headache.  No other specific complaints in a complete review of systems (except as listed in HPI above).  Objective  Vitals:   01/10/21 1040 01/10/21 1047  BP: (!) 142/90 (!) 142/80  Pulse: 87   Resp: 16   Temp: 98.1 F (36.7 C)   TempSrc: Oral   SpO2: 95%   Weight: 196 lb 8 oz (89.1 kg)   Height: 5' 4"  (1.626 m)     Body mass index is 33.73 kg/m.  Physical Exam  Constitutional: Patient appears well-developed and well-nourished. Obese  No distress.  HEENT: head atraumatic, normocephalic, pupils equal and reactive to light,  neck supple Cardiovascular: Normal rate, regular rhythm and normal heart sounds.  No murmur  heard. No BLE edema. Pulmonary/Chest: Effort normal and breath sounds normal. No respiratory distress. Abdominal: Soft.  There is no tenderness. Psychiatric: Patient has a normal mood and affect. behavior is normal. Judgment and thought content normal.  Recent Results (from the past 2160 hour(s))  TSH     Status: None   Collection Time: 10/16/20 12:22 PM  Result Value Ref Range   TSH 3.40 0.40 - 4.50 mIU/L  TSH     Status: None   Collection Time: 11/30/20  2:15 PM  Result Value Ref Range   TSH 4.163 0.350 - 4.500 uIU/mL    Comment: Performed by a 3rd Generation assay with a functional sensitivity of <=0.01 uIU/mL. Performed at [facility], [address], [place] [address]   Reticulocytes     Status: Abnormal   Collection Time: 11/30/20  2:15 PM  Result Value Ref Range   Retic Ct Pct 1.6 0.4 - 3.1 %   RBC. 4.11 3.87 - 5.11 MIL/uL   Retic Count, Absolute 65.3 19.0 - 186.0 K/uL   Immature Retic Fract 30.1 (H) 2.3 - 15.9 %    Comment: Performed at [facility], [address] [place], [place] [address]  Haptoglobin     Status: None    Collection Time: 11/30/20  2:15 PM  Result Value Ref Range   Haptoglobin 167 41 - 333 mg/dL    Comment: (NOTE) Performed At: [facility] [place], [place] [id number] Rush Farmer MD [id number]   Iron and TIBC     Status: Abnormal   Collection Time: 11/30/20  2:15 PM  Result Value Ref Range   Iron 27 (L) 28 - 170 ug/dL   TIBC 459 (H) 250 - 450 ug/dL   Saturation Ratios 6 (L) 10.4 - 31.8 %   UIBC 432 ug/dL    Comment: Performed at [facility], [address] [place], [place] [address]  Folate     Status: None   Collection Time: 11/30/20  2:15 PM  Result Value Ref Range   Folate 15.3 >5.9 ng/mL    Comment: Performed at [facility], [address] [place], [place] [address]  Ferritin     Status: Abnormal   Collection Time: 11/30/20  2:15 PM  Result Value Ref Range   Ferritin 6 (L) 11 - 307 ng/mL    Comment: Performed at [facility], [address] [place], [place] [address]  Vitamin B12     Status: None   Collection Time: 11/30/20  2:15 PM  Result Value Ref Range   Vitamin B-12 229 180 - 914 pg/mL    Comment: (NOTE) This assay is not validated for testing neonatal or myeloproliferative syndrome specimens for Vitamin B12 levels. Performed at [facility], [place] [address] [place], [place] [address]   CBC with Differential     Status: Abnormal   Collection Time: 11/30/20  2:15 PM  Result Value Ref Range   WBC 9.2 4.0 - 10.5 K/uL   RBC 4.18 3.87 - 5.11 MIL/uL   Hemoglobin 10.1 (L) 12.0 - 15.0 g/dL   HCT 33.9 (L) 36.0 - 46.0 %   MCV 81.1 80.0 - 100.0 fL   MCH 24.2 (L) 26.0 - 34.0 pg   MCHC 29.8 (L) 30.0 - 36.0 g/dL   RDW 17.6 (H) 11.5 - 15.5 %   Platelets 328 150 - 400 K/uL   nRBC 0.0 0.0 - 0.2 %   Neutrophils Relative % 60 %  Neutro Abs 5.5 1.7 - 7.7 K/uL   Lymphocytes Relative 31 %   Lymphs Abs 2.9 0.7 - 4.0 K/uL   Monocytes Relative 7 %   Monocytes Absolute 0.7 0.1 - 1.0 K/uL   Eosinophils Relative  2 %   Eosinophils Absolute 0.2 0.0 - 0.5 K/uL   Basophils Relative 0 %   Basophils Absolute 0.0 0.0 - 0.1 K/uL   Immature Granulocytes 0 %   Abs Immature Granulocytes 0.04 0.00 - 0.07 K/uL    Comment: Performed at [facility], [address] [place], [place] [address]  POCT HgB A1C     Status: Abnormal   Collection Time: 01/10/21 10:46 AM  Result Value Ref Range   Hemoglobin A1C 6.3 (A) 4.0 - 5.6 %   HbA1c POC (<> result, manual entry)     HbA1c, POC (prediabetic range)     HbA1c, POC (controlled diabetic range)        PHQ2/9: Depression screen [facility] 2/9 01/10/2021 10/16/2020 10/06/2020 09/12/2020 05/09/2020  Decreased Interest 0 0 0 0 0  Down, Depressed, Hopeless 0 0 0 0 0  PHQ - 2 Score 0 0 0 0 0  Altered sleeping - - - - -  Tired, decreased energy - - - - -  Change in appetite - - - - -  Feeling bad or failure about yourself  - - - - -  Trouble concentrating - - - - -  Moving slowly or fidgety/restless - - - - -  Suicidal thoughts - - - - -  PHQ-9 Score - - - - -  Difficult doing work/chores - - - - -  Some recent data might be hidden    phq 9 is negative   Fall Risk: Fall Risk  01/10/2021 10/16/2020 10/06/2020 09/12/2020 05/09/2020  Falls in the past year? 0 0 0 0 1  Number falls in past yr: 0 0 0 0 0  Injury with Fall? 0 0 0 0 0  Risk for fall due to : - - - - Impaired balance/gait;Impaired mobility  Follow up - - Falls evaluation completed - Falls prevention discussed     Functional Status Survey: Is the patient deaf or have difficulty hearing?: Yes Does the patient have difficulty seeing, even when wearing glasses/contacts?: Yes Does the patient have difficulty concentrating, remembering, or making decisions?: No Does the patient have difficulty walking or climbing stairs?: Yes Does the patient have difficulty dressing or bathing?: No Does the patient have difficulty doing errands alone such as visiting a doctor's office or shopping?: No    Assessment &  Plan  1. Stage 3a chronic kidney disease (HCC)  - COMPLETE METABOLIC PANEL WITH GFR - Microalbumin / creatinine urine ratio  2. Gastroesophageal reflux disease without esophagitis   3. Diabetes mellitus with proteinuric diabetic nephropathy (HCC)  - Microalbumin / creatinine urine ratio  4. Paroxysmal A-fib ([place])   5. Atherosclerosis of aorta (HCC)  - Lipid panel  6. Vitamin D deficiency   7. Dyslipidemia associated with type 2 diabetes mellitus (HCC)  - POCT HgB A1C - COMPLETE METABOLIC PANEL WITH GFR  8. History of gout   9. Intermittent low back pain  - acetaminophen-codeine (TYLENOL #3) 300-30 MG tablet; TAKE 1 TABLET BY MOUTH EVERY 6 HOURS AS NEEDED FOR CHRONIC PAIN  Dispense: 30 tablet; Refill: 0  10. Pruritic rash  - Ambulatory referral to Dermatology - hydrOXYzine (ATARAX/VISTARIL) 10 MG tablet; Take 1 tablet (10 mg total) by mouth at bedtime.  Dispense: 30 tablet; Refill: 0 - clotrimazole-betamethasone (LOTRISONE) cream; Apply 1 application topically 2 (two) times daily as needed. To affected skin  Dispense: 45 g; Refill: 0  11. Benign hypertension  - carvedilol (COREG) 25 MG tablet; Take 1 tablet (25 mg total) by mouth 2 (two) times daily. New dose  Dispense: 180 tablet; Refill: 1 - olmesartan (BENICAR) 40 MG tablet; Take 1 tablet (40 mg total) by mouth daily.  Dispense: 90 tablet; Refill: 1  12. Allergic rhinitis, seasonal  - loratadine (CLARITIN) 10 MG tablet; Take 1 tablet (10 mg total) by mouth daily.  Dispense: 90 tablet; Refill: 1  13. Primary osteoarthritis of left knee  - diclofenac Sodium (VOLTAREN) 1 % GEL; Apply 2 g topically 3 (three) times daily as needed.  Dispense: 100 g; Refill: 1

## 2021-01-10 ENCOUNTER — Other Ambulatory Visit: Payer: Self-pay

## 2021-01-10 ENCOUNTER — Ambulatory Visit (INDEPENDENT_AMBULATORY_CARE_PROVIDER_SITE_OTHER): Payer: Medicare HMO | Admitting: Family Medicine

## 2021-01-10 ENCOUNTER — Encounter: Payer: Self-pay | Admitting: Family Medicine

## 2021-01-10 VITALS — BP 142/80 | HR 87 | Temp 98.1°F | Resp 16 | Ht 64.0 in | Wt 196.5 lb

## 2021-01-10 DIAGNOSIS — E785 Hyperlipidemia, unspecified: Secondary | ICD-10-CM | POA: Diagnosis not present

## 2021-01-10 DIAGNOSIS — I700 Atherosclerosis of aorta: Secondary | ICD-10-CM | POA: Diagnosis not present

## 2021-01-10 DIAGNOSIS — N1831 Chronic kidney disease, stage 3a: Secondary | ICD-10-CM

## 2021-01-10 DIAGNOSIS — I480 Paroxysmal atrial fibrillation: Secondary | ICD-10-CM | POA: Diagnosis not present

## 2021-01-10 DIAGNOSIS — I10 Essential (primary) hypertension: Secondary | ICD-10-CM

## 2021-01-10 DIAGNOSIS — M545 Low back pain, unspecified: Secondary | ICD-10-CM

## 2021-01-10 DIAGNOSIS — E1169 Type 2 diabetes mellitus with other specified complication: Secondary | ICD-10-CM | POA: Diagnosis not present

## 2021-01-10 DIAGNOSIS — E1121 Type 2 diabetes mellitus with diabetic nephropathy: Secondary | ICD-10-CM

## 2021-01-10 DIAGNOSIS — E559 Vitamin D deficiency, unspecified: Secondary | ICD-10-CM

## 2021-01-10 DIAGNOSIS — L282 Other prurigo: Secondary | ICD-10-CM

## 2021-01-10 DIAGNOSIS — Z8739 Personal history of other diseases of the musculoskeletal system and connective tissue: Secondary | ICD-10-CM

## 2021-01-10 DIAGNOSIS — K219 Gastro-esophageal reflux disease without esophagitis: Secondary | ICD-10-CM

## 2021-01-10 DIAGNOSIS — J302 Other seasonal allergic rhinitis: Secondary | ICD-10-CM

## 2021-01-10 DIAGNOSIS — J3089 Other allergic rhinitis: Secondary | ICD-10-CM

## 2021-01-10 DIAGNOSIS — C7B00 Secondary carcinoid tumors, unspecified site: Secondary | ICD-10-CM | POA: Diagnosis not present

## 2021-01-10 DIAGNOSIS — M1712 Unilateral primary osteoarthritis, left knee: Secondary | ICD-10-CM

## 2021-01-10 DIAGNOSIS — R600 Localized edema: Secondary | ICD-10-CM

## 2021-01-10 LAB — POCT GLYCOSYLATED HEMOGLOBIN (HGB A1C): Hemoglobin A1C: 6.3 % — AB (ref 4.0–5.6)

## 2021-01-10 MED ORDER — ACETAMINOPHEN-CODEINE #3 300-30 MG PO TABS: ORAL_TABLET | ORAL | 0 refills | Status: DC

## 2021-01-10 MED ORDER — HYDROXYZINE HCL 10 MG PO TABS: 10.0000 mg | ORAL_TABLET | Freq: Every evening | ORAL | 0 refills | Status: DC

## 2021-01-10 MED ORDER — CARVEDILOL 25 MG PO TABS
25.0000 mg | ORAL_TABLET | Freq: Two times a day (BID) | ORAL | 1 refills | Status: DC
Start: 2021-01-10 — End: 2021-05-14

## 2021-01-10 MED ORDER — FUROSEMIDE 20 MG PO TABS: 20.0000 mg | ORAL_TABLET | Freq: Every day | ORAL | 3 refills | Status: DC

## 2021-01-10 MED ORDER — DICLOFENAC SODIUM 1 % EX GEL: 2.0000 g | Freq: Three times a day (TID) | CUTANEOUS | 1 refills | Status: DC | PRN

## 2021-01-10 MED ORDER — POTASSIUM CHLORIDE CRYS ER 20 MEQ PO TBCR: 20.0000 meq | EXTENDED_RELEASE_TABLET | Freq: Every day | ORAL | 3 refills | Status: DC

## 2021-01-10 MED ORDER — CLOTRIMAZOLE-BETAMETHASONE 1-0.05 % EX CREA: 1.0000 "application " | TOPICAL_CREAM | Freq: Two times a day (BID) | CUTANEOUS | 0 refills | Status: DC | PRN

## 2021-01-10 MED ORDER — LORATADINE 10 MG PO TABS: 10.0000 mg | ORAL_TABLET | Freq: Every day | ORAL | 1 refills | Status: AC

## 2021-01-10 MED ORDER — OLMESARTAN MEDOXOMIL 40 MG PO TABS: 40.0000 mg | ORAL_TABLET | Freq: Every day | ORAL | 1 refills | Status: DC

## 2021-01-10 NOTE — Addendum Note (Signed)
Addended by: Royal Hawthorn on: 01/10/2021 11:45 AM   Modules accepted: Orders

## 2021-01-10 NOTE — Addendum Note (Signed)
Addended by: Royal Hawthorn on: 01/10/2021 11:43 AM   Modules accepted: Orders

## 2021-01-11 LAB — MICROALBUMIN / CREATININE URINE RATIO
Creatinine, Urine: 60 mg/dL (ref 20–275)
Microalb Creat Ratio: 40 mcg/mg creat — ABNORMAL HIGH (ref <30)
Microalb, Ur: 2.4 mg/dL

## 2021-01-11 LAB — COMPLETE METABOLIC PANEL WITH GFR
AG Ratio: 1.5 (calc) (ref 1.0–2.5)
ALT: 9 U/L (ref 6–29)
AST: 12 U/L (ref 10–35)
Albumin: 4 g/dL (ref 3.6–5.1)
Alkaline phosphatase (APISO): 59 U/L (ref 37–153)
BUN/Creatinine Ratio: 8 (calc) (ref 6–22)
BUN: 8 mg/dL (ref 7–25)
CO2: 24 mmol/L (ref 20–32)
Calcium: 10 mg/dL (ref 8.6–10.4)
Chloride: 107 mmol/L (ref 98–110)
Creat: 1 mg/dL — ABNORMAL HIGH (ref 0.60–0.88)
GFR, Est African American: 59 mL/min/{1.73_m2} — ABNORMAL LOW (ref >=60)
GFR, Est Non African American: 51 mL/min/{1.73_m2} — ABNORMAL LOW (ref >=60)
Globulin: 2.6 g/dL (calc) (ref 1.9–3.7)
Glucose, Bld: 103 mg/dL — ABNORMAL HIGH (ref 65–99)
Potassium: 4.4 mmol/L (ref 3.5–5.3)
Sodium: 142 mmol/L (ref 135–146)
Total Bilirubin: 0.4 mg/dL (ref 0.2–1.2)
Total Protein: 6.6 g/dL (ref 6.1–8.1)

## 2021-01-11 LAB — LIPID PANEL
Cholesterol: 130 mg/dL (ref <200)
HDL: 54 mg/dL (ref >=50)
LDL Cholesterol (Calc): 55 mg/dL (calc)
Non-HDL Cholesterol (Calc): 76 mg/dL (calc) (ref <130)
Total CHOL/HDL Ratio: 2.4 (calc) (ref <5.0)
Triglycerides: 129 mg/dL (ref <150)

## 2021-01-18 ENCOUNTER — Ambulatory Visit: Payer: Medicare HMO | Admitting: Dermatology

## 2021-01-22 ENCOUNTER — Other Ambulatory Visit: Payer: Self-pay

## 2021-01-22 ENCOUNTER — Ambulatory Visit (INDEPENDENT_AMBULATORY_CARE_PROVIDER_SITE_OTHER): Payer: Medicare HMO | Admitting: Dermatology

## 2021-01-22 DIAGNOSIS — L304 Erythema intertrigo: Secondary | ICD-10-CM

## 2021-01-22 DIAGNOSIS — E119 Type 2 diabetes mellitus without complications: Secondary | ICD-10-CM | POA: Diagnosis not present

## 2021-01-22 DIAGNOSIS — Z859 Personal history of malignant neoplasm, unspecified: Secondary | ICD-10-CM

## 2021-01-22 MED ORDER — KETOCONAZOLE 2 % EX CREA: 1.0000 "application " | TOPICAL_CREAM | CUTANEOUS | 3 refills | Status: AC

## 2021-01-22 MED ORDER — TACROLIMUS 0.1 % EX OINT: TOPICAL_OINTMENT | CUTANEOUS | 2 refills | Status: DC

## 2021-01-22 NOTE — Patient Instructions (Addendum)
Zeasorb AF powder to use in the morning under breast and in groin body folds  Ketoconazole 2% cream, use 2 times a day for 2 weeks, then decrease to once a day  Tacrolimus 0.1% ointment use 2 times a day over the ketoconazole cr for 2 weeks, then decrease to 1 time a day (opposite of ketoconazole cream) until clear, then as needed for flares  Stop using the Clotrimazole/Betamethasone cream   If you have any questions or concerns for your doctor, please call our main line at [phone] and press option 4 to reach your doctor's medical assistant. If no one answers, please leave a voicemail as directed and we will return your call as soon as possible. Messages left after 4 pm will be answered the following business day.   You may also send [place] a message via [place]. We typically respond to MyChart messages within 1-2 business days.  For prescription refills, please ask your pharmacy to contact our office. Our fax number is [phone].  If you have an urgent issue when the clinic is closed that cannot wait until the next business day, you can page your doctor at the number below.    Please note that while we do our best to be available for urgent issues outside of office hours, we are not available 24/7.   If you have an urgent issue and are unable to reach [place], you may choose to seek medical care at your doctor's office, retail clinic, urgent care center, or emergency room.  If you have a medical emergency, please immediately call 911 or go to the emergency department.  Pager Numbers  - Dr. Nehemiah Massed: [phone]  - Dr. Laurence Ferrari: [phone]  - Dr. Nicole Kindred: [phone]  In the event of inclement weather, please call our main line at [phone] for an update on the status of any delays or closures.  Dermatology Medication Tips: Please keep the boxes that topical medications come in in order to help keep track of the instructions about where and how to use these. Pharmacies typically  print the medication instructions only on the boxes and not directly on the medication tubes.   If your medication is too expensive, please contact our office at [phone] option 4 or send [place] a message through [place].   We are unable to tell what your co-pay for medications will be in advance as this is different depending on your insurance coverage. However, we may be able to find a substitute medication at lower cost or fill out paperwork to get insurance to cover a needed medication.   If a prior authorization is required to get your medication covered by your insurance company, please allow [place] 1-2 business days to complete this process.  Drug prices often vary depending on where the prescription is filled and some pharmacies may offer cheaper prices.  The website www.goodrx.com contains coupons for medications through different pharmacies. The prices here do not account for what the cost may be with help from insurance (it may be cheaper with your insurance), but the website can give you the price if you did not use any insurance.  - You can print the associated coupon and take it with your prescription to the pharmacy.  - You may also stop by our office during regular business hours and pick up a GoodRx coupon card.  - If you need your prescription sent electronically to a different pharmacy, notify our office through [facility] or by phone at [phone] option 4.

## 2021-01-22 NOTE — Progress Notes (Signed)
   New Patient Visit  Subjective  Susan Bowen is a 87 y.o. female who presents for the following: Rash (Inframammary,67m, itchy, clotrimazole/betamethasone) and boils (Groin, ~38m, clotrimazole/betamethasone cr).  New patient referral from Dr. Steele Sizer  The following portions of the chart were reviewed this encounter and updated as appropriate:       Review of Systems:  No other skin or systemic complaints except as noted in HPI or Assessment and Plan.  Objective  Well appearing patient in no apparent distress; mood and affect are within normal limits.  A focused examination was performed including inframamamry, groin. Relevant physical exam findings are noted in the Assessment and Plan.  Objective  bil Inframammary Fold, inguinal and perivulvar crease bil: Dusky pink patch L inframammary Erythema bil inguinal crease and perivulvar crease with maceration, and wrinkling c/w skin atrophy No boils or cysts noted today    Assessment & Plan    Hx of Metastatic carcinoid tumor Intertrigo bil Inframammary Fold, inguinal and perivulvar crease bil  With steroid atrophy from chronic use of clotrimazole/betamethasone cream  Intertrigo is a chronic recurrent rash that occurs in skin fold areas that may be associated with friction; heat; moisture; yeast; fungus; and bacteria.  It is exacerbated by increased movement / activity; sweating; and higher atmospheric temperature.  D/c clotrimazole/betamethasone cream Start Ketoconazole 2% cr bid x 2wks, then decrease to qd Start Tacrolimus 0.1% oint bid x 2 wks, then decrease to qhs until clear   Once clear, can start Zeasorb AF powder qAM  ketoconazole (NIZORAL) 2 % cream - bil Inframammary Fold, inguinal and perivulvar crease bil  tacrolimus (PROTOPIC) 0.1 % ointment - bil Inframammary Fold, inguinal and perivulvar crease bil  Return in about 1 month (around 02/22/2021) for Intertrigo f/u.  I, Othelia Pulling, RMA, am acting as  scribe for Brendolyn Patty, MD . Documentation: I have reviewed the above documentation for accuracy and completeness, and I agree with the above.  Brendolyn Patty MD

## 2021-01-26 ENCOUNTER — Other Ambulatory Visit: Payer: Self-pay | Admitting: Family Medicine

## 2021-01-26 DIAGNOSIS — F5101 Primary insomnia: Secondary | ICD-10-CM

## 2021-01-26 NOTE — Telephone Encounter (Signed)
   Notes to clinic:medication requested not on current list Medication has bee d/c   Requested Prescriptions  Pending Prescriptions Disp Refills   traZODone (DESYREL) 50 MG tablet [Pharmacy Med Name: TRAZODONE 50 MG TABLET] 90 tablet 0    Sig: TAKE 1/2-1 TABLETS BY MOUTH AT BEDTIME AS NEEDED FOR SLEEP.      Psychiatry: Antidepressants - Serotonin Modulator Passed - 01/26/2021 12:31 PM      Passed - Completed PHQ-2 or PHQ-9 in the last 360 days      Passed - Valid encounter within last 6 months    Recent Outpatient Visits           2 weeks ago Stage 3a chronic kidney disease [facility])   [facility] Steele Sizer, MD   3 months ago [facility] Steele Sizer, MD   3 months ago Pruritic intertrigo   [facility] Delsa Grana, PA-C   4 months ago Dyslipidemia associated with type 2 diabetes mellitus [facility])   [facility] Steele Sizer, MD   8 months ago Diabetes mellitus with proteinuric diabetic nephropathy [facility])   [facility] Steele Sizer, MD       Future Appointments             In 1 month Brendolyn Patty, MD [place]   In 2 months Fletcher Anon, Mertie Clause, MD [facility], [facility]   In 3 months Steele Sizer, MD [facility], [facility]

## 2021-01-31 DIAGNOSIS — H26492 Other secondary cataract, left eye: Secondary | ICD-10-CM | POA: Diagnosis not present

## 2021-01-31 DIAGNOSIS — H401132 Primary open-angle glaucoma, bilateral, moderate stage: Secondary | ICD-10-CM | POA: Diagnosis not present

## 2021-01-31 LAB — HM DIABETES EYE EXAM

## 2021-02-02 ENCOUNTER — Other Ambulatory Visit: Payer: Self-pay | Admitting: Family Medicine

## 2021-02-02 DIAGNOSIS — L282 Other prurigo: Secondary | ICD-10-CM

## 2021-02-02 NOTE — Telephone Encounter (Signed)
Requested medication (s) are due for refill today:   Yes  Requested medication (s) are on the active medication list:   Yes  Future visit scheduled:   Yes   Last ordered: 01/10/2021 #30, 0 refills  Returned because pharmacy requesting a 90 day supply and a DX Code   Requested Prescriptions  Pending Prescriptions Disp Refills   hydrOXYzine (ATARAX/VISTARIL) 10 MG tablet [Pharmacy Med Name: HYDROXYZINE HCL 10 MG TABLET] 90 tablet 1    Sig: TAKE 1 TABLET BY MOUTH EVERYDAY AT BEDTIME      Ear, Nose, and Throat:  Antihistamines Passed - 02/02/2021 12:30 PM      Passed - Valid encounter within last 12 months    Recent Outpatient Visits           3 weeks ago Stage 3a chronic kidney disease ([place])   [facility] Steele Sizer, MD   3 months ago [facility] Steele Sizer, MD   3 months ago Pruritic intertrigo   [facility] Delsa Grana, PA-C   4 months ago Dyslipidemia associated with type 2 diabetes mellitus [facility])   [facility] Steele Sizer, MD   8 months ago Diabetes mellitus with proteinuric diabetic nephropathy [facility])   [facility] Steele Sizer, MD       Future Appointments             In 3 weeks Brendolyn Patty, MD [place]   In 2 months Fletcher Anon, Mertie Clause, MD [facility], [place]   In 3 months Steele Sizer, MD [facility], [facility]

## 2021-02-19 DIAGNOSIS — H401132 Primary open-angle glaucoma, bilateral, moderate stage: Secondary | ICD-10-CM | POA: Diagnosis not present

## 2021-02-28 ENCOUNTER — Encounter: Payer: Self-pay | Admitting: Dermatology

## 2021-02-28 ENCOUNTER — Other Ambulatory Visit: Payer: Self-pay

## 2021-02-28 ENCOUNTER — Ambulatory Visit (INDEPENDENT_AMBULATORY_CARE_PROVIDER_SITE_OTHER): Payer: Medicare HMO | Admitting: Dermatology

## 2021-02-28 DIAGNOSIS — L304 Erythema intertrigo: Secondary | ICD-10-CM | POA: Diagnosis not present

## 2021-02-28 DIAGNOSIS — L821 Other seborrheic keratosis: Secondary | ICD-10-CM | POA: Diagnosis not present

## 2021-02-28 NOTE — Progress Notes (Signed)
   Follow-Up Visit   Subjective  Susan Bowen is a 87 y.o. female who presents for the following: Rash (1 month recheck. Intertrigo at breast folds and groin creases. Patient reports rash is a lot better. Sometimes gets bumps in groin area and on chest. Using Ketoconazole 2% cream and Tacrolimus 0.1% oint QD. ).    The following portions of the chart were reviewed this encounter and updated as appropriate:      Review of Systems: No other skin or systemic complaints except as noted in HPI or Assessment and Plan.  Objective  Well appearing patient in no apparent distress; mood and affect are within normal limits.  A focused examination was performed including chest, axillae, abdomen, back, and buttocks. Relevant physical exam findings are noted in the Assessment and Plan.  Objective  inframammary folds, groin folds: Mild hyperpigmentation at breast folds  Objective  bilateral cheeks: Stuck-on, waxy, tan-brown macules -- Discussed benign etiology and prognosis.   Assessment & Plan  Erythema intertrigo inframammary folds, groin folds  Improved Continue Ketoconazole 2% cream QD, increase to BID PRN flares  Use Tacrolimus 0.1% ointment QD-BID PRN inflammation.  Intertrigo is a chronic recurrent rash that occurs in skin fold areas that may be associated with friction; heat; moisture; yeast; fungus; and bacteria.  It is exacerbated by increased movement / activity; sweating; and higher atmospheric temperature.   Seborrheic keratosis bilateral cheeks  Reassured benign age-related growth.  Recommend observation.  Discussed cryotherapy if spot(s) become irritated or inflamed.   Risk of dyspigmentation discussed with cryotherapy.  Cosmetic if not symptomatic, insurance will not cover.   Return if symptoms worsen or fail to improve.   I, Emelia Salisbury, CMA, am acting as scribe for Brendolyn Patty, MD.  Documentation: I have reviewed the above documentation for accuracy and  completeness, and I agree with the above.  Brendolyn Patty MD

## 2021-02-28 NOTE — Patient Instructions (Addendum)
If you have any questions or concerns for your doctor, please call our main line at [phone] and press option 4 to reach your doctor's medical assistant. If no one answers, please leave a voicemail as directed and we will return your call as soon as possible. Messages left after 4 pm will be answered the following business day.   You may also send [place] a message via [place]. We typically respond to MyChart messages within 1-2 business days.  For prescription refills, please ask your pharmacy to contact our office. Our fax number is [phone].  If you have an urgent issue when the clinic is closed that cannot wait until the next business day, you can page your doctor at the number below.    Please note that while we do our best to be available for urgent issues outside of office hours, we are not available 24/7.   If you have an urgent issue and are unable to reach [place], you may choose to seek medical care at your doctor's office, retail clinic, urgent care center, or emergency room.  If you have a medical emergency, please immediately call 911 or go to the emergency department.  Pager Numbers  - Dr. Nehemiah Massed: [phone]  - Dr. Laurence Ferrari: [phone]  - Dr. Nicole Kindred: [phone]  In the event of inclement weather, please call our main line at [phone] for an update on the status of any delays or closures.  Dermatology Medication Tips: Please keep the boxes that topical medications come in in order to help keep track of the instructions about where and how to use these. Pharmacies typically print the medication instructions only on the boxes and not directly on the medication tubes.   If your medication is too expensive, please contact our office at [phone] option 4 or send [place] a message through [place].   We are unable to tell what your co-pay for medications will be in advance as this is different depending on your insurance coverage. However, we may be able to find a substitute  medication at lower cost or fill out paperwork to get insurance to cover a needed medication.   If a prior authorization is required to get your medication covered by your insurance company, please allow [place] 1-2 business days to complete this process.  Drug prices often vary depending on where the prescription is filled and some pharmacies may offer cheaper prices.  The website www.goodrx.com contains coupons for medications through different pharmacies. The prices here do not account for what the cost may be with help from insurance (it may be cheaper with your insurance), but the website can give you the price if you did not use any insurance.  - You can print the associated coupon and take it with your prescription to the pharmacy.  - You may also stop by our office during regular business hours and pick up a GoodRx coupon card.  - If you need your prescription sent electronically to a different pharmacy, notify our office through [facility] or by phone at [phone] option 4.  Seborrheic Keratosis  What causes seborrheic keratoses? Seborrheic keratoses are harmless, common skin growths that first appear during adult life.  As time goes by, more growths appear.  Some people may develop a large number of them.  Seborrheic keratoses appear on both covered and uncovered body parts.  They are not caused by sunlight.  The tendency to develop seborrheic keratoses can be inherited.  They vary in color from skin-colored to gray,  brown, or even black.  They can be either smooth or have a rough, warty surface.   Seborrheic keratoses are superficial and look as if they were stuck on the skin.  Under the microscope this type of keratosis looks like layers upon layers of skin.  That is why at times the top layer may seem to fall off, but the rest of the growth remains and re-grows.    Treatment Seborrheic keratoses do not need to be treated, but can easily be removed in the office.  Seborrheic  keratoses often cause symptoms when they rub on clothing or jewelry.  Lesions can be in the way of shaving.  If they become inflamed, they can cause itching, soreness, or burning.  Removal of a seborrheic keratosis can be accomplished by freezing, burning, or surgery. If any spot bleeds, scabs, or grows rapidly, please return to have it checked, as these can be an indication of a skin cancer. 

## 2021-03-05 ENCOUNTER — Inpatient Hospital Stay: Payer: Medicare HMO | Attending: Oncology

## 2021-03-05 ENCOUNTER — Inpatient Hospital Stay: Payer: Medicare HMO

## 2021-03-05 DIAGNOSIS — D509 Iron deficiency anemia, unspecified: Secondary | ICD-10-CM | POA: Diagnosis not present

## 2021-03-05 DIAGNOSIS — C7B09 Secondary carcinoid tumors of other sites: Secondary | ICD-10-CM | POA: Insufficient documentation

## 2021-03-05 DIAGNOSIS — E538 Deficiency of other specified B group vitamins: Secondary | ICD-10-CM | POA: Insufficient documentation

## 2021-03-05 DIAGNOSIS — C7B00 Secondary carcinoid tumors, unspecified site: Secondary | ICD-10-CM

## 2021-03-05 DIAGNOSIS — Z79899 Other long term (current) drug therapy: Secondary | ICD-10-CM | POA: Insufficient documentation

## 2021-03-05 DIAGNOSIS — D508 Other iron deficiency anemias: Secondary | ICD-10-CM

## 2021-03-05 LAB — CBC WITH DIFFERENTIAL/PLATELET
Abs Immature Granulocytes: 0.04 10*3/uL (ref 0.00–0.07)
Basophils Absolute: 0 10*3/uL (ref 0.0–0.1)
Basophils Relative: 0 %
Eosinophils Absolute: 0.2 10*3/uL (ref 0.0–0.5)
Eosinophils Relative: 2 %
HCT: 40.3 % (ref 36.0–46.0)
Hemoglobin: 12.9 g/dL (ref 12.0–15.0)
Immature Granulocytes: 0 %
Lymphocytes Relative: 34 %
Lymphs Abs: 3.2 10*3/uL (ref 0.7–4.0)
MCH: 28 pg (ref 26.0–34.0)
MCHC: 32 g/dL (ref 30.0–36.0)
MCV: 87.4 fL (ref 80.0–100.0)
Monocytes Absolute: 0.6 10*3/uL (ref 0.1–1.0)
Monocytes Relative: 6 %
Neutro Abs: 5.3 10*3/uL (ref 1.7–7.7)
Neutrophils Relative %: 58 %
Platelets: 270 10*3/uL (ref 150–400)
RBC: 4.61 MIL/uL (ref 3.87–5.11)
RDW: 18.3 % — ABNORMAL HIGH (ref 11.5–15.5)
WBC: 9.3 10*3/uL (ref 4.0–10.5)
nRBC: 0 % (ref 0.0–0.2)

## 2021-03-05 LAB — FERRITIN: Ferritin: 54 ng/mL (ref 11–307)

## 2021-03-05 LAB — VITAMIN B12: Vitamin B-12: 120 pg/mL — ABNORMAL LOW (ref 180–914)

## 2021-03-05 LAB — IRON AND TIBC
Iron: 37 ug/dL (ref 28–170)
Saturation Ratios: 10 % — ABNORMAL LOW (ref 10.4–31.8)
TIBC: 374 ug/dL (ref 250–450)
UIBC: 337 ug/dL

## 2021-03-05 MED ORDER — CYANOCOBALAMIN 1000 MCG/ML IJ SOLN
1000.0000 ug | INTRAMUSCULAR | Status: DC
  Administered 2021-03-05: 1000 ug via INTRAMUSCULAR
  Filled 2021-03-05: qty 1

## 2021-03-08 ENCOUNTER — Telehealth: Payer: Self-pay | Admitting: *Deleted

## 2021-03-08 NOTE — Telephone Encounter (Signed)
Daughter called stating that patient wants to know why her B 12 level is low since she has been taking B 12 injections.

## 2021-03-09 ENCOUNTER — Other Ambulatory Visit: Payer: Self-pay

## 2021-03-09 ENCOUNTER — Ambulatory Visit: Payer: Self-pay | Admitting: *Deleted

## 2021-03-09 ENCOUNTER — Encounter: Payer: Self-pay | Admitting: Emergency Medicine

## 2021-03-09 ENCOUNTER — Emergency Department
Admission: EM | Admit: 2021-03-09 | Discharge: 2021-03-09 | Disposition: A | Discharge status: Home / Self Care | Payer: Medicare HMO | Attending: Emergency Medicine | Admitting: Emergency Medicine

## 2021-03-09 DIAGNOSIS — I129 Hypertensive chronic kidney disease with stage 1 through stage 4 chronic kidney disease, or unspecified chronic kidney disease: Secondary | ICD-10-CM | POA: Diagnosis not present

## 2021-03-09 DIAGNOSIS — N183 Chronic kidney disease, stage 3 unspecified: Secondary | ICD-10-CM | POA: Insufficient documentation

## 2021-03-09 DIAGNOSIS — E890 Postprocedural hypothyroidism: Secondary | ICD-10-CM | POA: Insufficient documentation

## 2021-03-09 DIAGNOSIS — Z79899 Other long term (current) drug therapy: Secondary | ICD-10-CM | POA: Insufficient documentation

## 2021-03-09 DIAGNOSIS — E1121 Type 2 diabetes mellitus with diabetic nephropathy: Secondary | ICD-10-CM | POA: Insufficient documentation

## 2021-03-09 DIAGNOSIS — Z85038 Personal history of other malignant neoplasm of large intestine: Secondary | ICD-10-CM | POA: Diagnosis not present

## 2021-03-09 DIAGNOSIS — Z7901 Long term (current) use of anticoagulants: Secondary | ICD-10-CM | POA: Insufficient documentation

## 2021-03-09 DIAGNOSIS — Z7984 Long term (current) use of oral hypoglycemic drugs: Secondary | ICD-10-CM | POA: Insufficient documentation

## 2021-03-09 DIAGNOSIS — I480 Paroxysmal atrial fibrillation: Secondary | ICD-10-CM | POA: Insufficient documentation

## 2021-03-09 DIAGNOSIS — R42 Dizziness and giddiness: Secondary | ICD-10-CM | POA: Insufficient documentation

## 2021-03-09 DIAGNOSIS — E1122 Type 2 diabetes mellitus with diabetic chronic kidney disease: Secondary | ICD-10-CM | POA: Insufficient documentation

## 2021-03-09 DIAGNOSIS — Z794 Long term (current) use of insulin: Secondary | ICD-10-CM | POA: Insufficient documentation

## 2021-03-09 LAB — CBC
HCT: 41.4 % (ref 36.0–46.0)
Hemoglobin: 13 g/dL (ref 12.0–15.0)
MCH: 27.4 pg (ref 26.0–34.0)
MCHC: 31.4 g/dL (ref 30.0–36.0)
MCV: 87.3 fL (ref 80.0–100.0)
Platelets: 271 10*3/uL (ref 150–400)
RBC: 4.74 MIL/uL (ref 3.87–5.11)
RDW: 17.6 % — ABNORMAL HIGH (ref 11.5–15.5)
WBC: 10.3 10*3/uL (ref 4.0–10.5)
nRBC: 0 % (ref 0.0–0.2)

## 2021-03-09 LAB — CBG MONITORING, ED: Glucose-Capillary: 142 mg/dL — ABNORMAL HIGH (ref 70–99)

## 2021-03-09 LAB — BASIC METABOLIC PANEL
Anion gap: 9 (ref 5–15)
BUN: 14 mg/dL (ref 8–23)
CO2: 26 mmol/L (ref 22–32)
Calcium: 10 mg/dL (ref 8.9–10.3)
Chloride: 103 mmol/L (ref 98–111)
Creatinine, Ser: 1.25 mg/dL — ABNORMAL HIGH (ref 0.44–1.00)
GFR, Estimated: 42 mL/min — ABNORMAL LOW (ref >60)
Glucose, Bld: 151 mg/dL — ABNORMAL HIGH (ref 70–99)
Potassium: 4.4 mmol/L (ref 3.5–5.1)
Sodium: 138 mmol/L (ref 135–145)

## 2021-03-09 LAB — URINALYSIS, COMPLETE (UACMP) WITH MICROSCOPIC
Bacteria, UA: NONE SEEN
Bilirubin Urine: NEGATIVE
Glucose, UA: >=500 mg/dL — AB
Ketones, ur: NEGATIVE mg/dL
Leukocytes,Ua: NEGATIVE
Nitrite: NEGATIVE
Protein, ur: NEGATIVE mg/dL
Specific Gravity, Urine: 1.007 (ref 1.005–1.030)
Squamous Epithelial / HPF: NONE SEEN (ref 0–5)
pH: 5 (ref 5.0–8.0)

## 2021-03-09 LAB — TROPONIN I (HIGH SENSITIVITY): Troponin I (High Sensitivity): 4 ng/L (ref <18)

## 2021-03-09 MED ORDER — SODIUM CHLORIDE 0.9 % IV BOLUS
500.0000 mL | Freq: Once | INTRAVENOUS | Status: AC
  Administered 2021-03-09: 500 mL via INTRAVENOUS

## 2021-03-09 NOTE — Progress Notes (Signed)
Called pt about b12 level per daughter who called in. I asked pt how she is doing. She said that for 2 weeks she had diarrhea and maybe 1-2 days of that the 2 weeks that she said she may have a solid stool. She has been drinking plenty of water. She ran out of coffee so none of that. She has not been eating good for several weeks. She can get dizzy, off balance, one day she thought she was going  pass out  and rested and felt some better. I asked her if she called her PCP and she said no. Her daughter called about b12 but the b12 level would cause these symptoms per Dr. Janese Banks. Dr. Janese Banks feels that she should go and see her PCP. She does have HTN, and DM and that can be possible issue to look at. Pt. Says she will call pcp this afternoon and see if she can be seen next week. I encourage pt. To drink fluids with electolytes in and try to eat a little bit every few hours to see if that helps. Dr. Janese Banks says that she can take b12 weekly x 4 weeks and then go back to monthly. Pt will call back and let me know if she wants to do that.

## 2021-03-09 NOTE — ED Triage Notes (Signed)
Pt comes into the ED via POV c/o near syncopal episodes x 2 weeks.  Pt admits to still eating and drinking well at this time.  PT does admit to headaches, but denies any CP, SHOB, N/V/D.  Pt has even and unlabored respirations at this time.  Pt does have DM.

## 2021-03-09 NOTE — Telephone Encounter (Signed)
Patient is calling to report she is having dizziness for 2 weeks - she feels like she is going to faint at times. Patient also reports on/off diarrhea for 2 weeks. Patient advised UC per disposition- no appointment available within 24 hour period. She states she will go.  Reason for Disposition . [1] MODERATE dizziness (e.g., interferes with normal activities) AND [2] has NOT been evaluated by physician for this  (Exception: dizziness caused by heat exposure, sudden standing, or poor fluid intake)  Answer Assessment - Initial Assessment Questions 1. DESCRIPTION: "Describe your dizziness."     Off/on during the day- at night feels swimmy headed 2. LIGHTHEADED: "Do you feel lightheaded?" (e.g., somewhat faint, woozy, weak upon standing)     Somewhat faint- off balance 3. VERTIGO: "Do you feel like either you or the room is spinning or tilting?" (i.e. vertigo)     no 4. SEVERITY: "How bad is it?"  "Do you feel like you are going to faint?" "Can you stand and walk?"   - MILD: Feels slightly dizzy, but walking normally.   - MODERATE: Feels unsteady when walking, but not falling; interferes with normal activities (e.g., school, work).   - SEVERE: Unable to walk without falling, or requires assistance to walk without falling; feels like passing out now.      Feels faint- sits down or a while 5. ONSET:  "When did the dizziness begin?"     Started 2 weeks ago 6. AGGRAVATING FACTORS: "Does anything make it worse?" (e.g., standing, change in head position)     no 7. HEART RATE: "Can you tell me your heart rate?" "How many beats in 15 seconds?"  (Note: not all patients can do this)       Unsure- sometimes feels harder than normal 8. CAUSE: "What do you think is causing the dizziness?"     Not sure 9. RECURRENT SYMPTOM: "Have you had dizziness before?" If Yes, ask: "When was the last time?" "What happened that time?"     Hx vertigo, B12 is low 10. OTHER SYMPTOMS: "Do you have any other symptoms?"  (e.g., fever, chest pain, vomiting, diarrhea, bleeding)       Diarrhea- off/on for 2 weeks 11. PREGNANCY: "Is there any chance you are pregnant?" "When was your last menstrual period?"       n/a  Protocols used: DIZZINESS [facility]

## 2021-03-09 NOTE — Discharge Instructions (Signed)
Please seek medical attention for any high fevers, chest pain, shortness of breath, change in behavior, persistent vomiting, bloody stool or any other new or concerning symptoms.  

## 2021-03-09 NOTE — ED Provider Notes (Signed)
[facility] Emergency Department Provider Note   ____________________________________________   I have reviewed the triage vital signs and the nursing notes.   HISTORY  Chief Complaint Near Syncope   History limited by: Not Limited   HPI Susan Bowen is a 87 y.o. female who presents to the emergency department today because of concerns for near syncopal episodes.  Patient states that the symptoms started around 2 weeks ago.  She will become dizzy and then feel as if she might pass out.  The patient states that it is happening every couple of days.  She notices it happening more when she is talking on the phone.  Patient does feel some fluttering in her chest although this has been going on even prior to the episodes of near syncope.  She denies any chest pain.  No shortness of breath.  Patient did have some diarrhea recently.   Records reviewed. Per medical record review patient has a history of HLD, HTN. DM. Paroxysmal afib.  Past Medical History:  Diagnosis Date  . Allergy   . Back spasm   . Bunion   . Carpal tunnel syndrome   . Cataracta   . Diabetes mellitus without complication ([place])   . Generalized osteoarthritis   . GERD (gastroesophageal reflux disease)   . Gout   . Hemorrhoids without complication   . Hyperlipidemia   . Hypertension   . Hypothyroidism   . Impingement syndrome of right shoulder   . Insomnia   . Lipoma   . Lung nodule   . Metastatic carcinoid tumor ([place]) 03/2016  . Neuritis or radiculitis due to rupture of lumbar intervertebral disc   . Obesity   . Osteoporosis   . PAF (paroxysmal atrial fibrillation) ([place])    a. 03/2018 AF w/ RVR-->converted spont.  CHA2DS2VASc = 7-->Eliquis.  . Proteinuria   . Rotator cuff tear   . Systolic murmur    a. 05/5642 Echo: EF 60-65%, no rwma, Gr1 DD, mild MR, mildly dil LA. Nl RV fxn. PASP 85mHg.  .Marland KitchenTIA (transient ischemic attack)   . TIA (transient ischemic attack) 1998   small TIA.   no residual effects  . Tinnitus of both ears   . Unspecified glaucoma(365.9)   . Vitamin D deficiency   . Wedge compression fracture of t11-T12 vertebra, sequela     Patient Active Problem List   Diagnosis Date Noted  . Iron deficiency anemia 12/04/2020  . Carcinoid tumor, malignant ([place] 09/08/2019  . Pain in joint of left shoulder 02/25/2019  . Pulmonary hypertension, unspecified ([place] 12/28/2018  . Paroxysmal A-fib ([place] 12/28/2018  . Chronic kidney disease, stage III (moderate) ([place] 04/01/2018  . Elevated parathyroid hormone 01/01/2018  . Elevated uric acid in blood 04/29/2017  . Plantar fasciitis of left foot 03/12/2016  . Postprocedural hypothyroidism 01/23/2016  . Aortic stenosis 09/26/2015  . Allergic rhinitis, seasonal 08/08/2015  . Benign hypertension 08/08/2015  . History of pneumonia 08/08/2015  . Carpal tunnel syndrome 08/08/2015  . Cataract 08/08/2015  . Insomnia, persistent 08/08/2015  . Dyslipidemia 08/08/2015  . History of depression 08/08/2015  . Glaucoma 08/08/2015  . Controlled gout 08/08/2015  . Fever blister 08/08/2015  . Bilateral hearing loss 08/08/2015  . Hemorrhoid 08/08/2015  . H/O iron deficiency anemia 08/08/2015  . Benign neoplasm of colon 08/08/2015  . Impingement syndrome of shoulder 08/08/2015  . Osteoporosis, post-menopausal 08/08/2015  . Morbid obesity ([place] 08/08/2015  . Generalized OA 08/08/2015  . Multinodular goiter 08/08/2015  .  Asymptomatic varicose veins 08/08/2015  . Polyp of vocal cord 08/08/2015  . History of vertebral compression fracture 08/08/2015  . Diabetes mellitus with proteinuric diabetic nephropathy ([place]) 08/08/2015  . LVH (left ventricular hypertrophy) 08/08/2015  . Moderate tricuspid regurgitation 08/08/2015  . History of radioactive iodine thyroid ablation 08/08/2015  . Lung nodule, solitary 05/12/2014  . Chronic cough 05/11/2014  . Vitamin D deficiency 12/20/2009  . Plantar fascial fibromatosis 12/20/2009  .  Personal history of fall 07/20/2008  . Cervical radiculitis 05/11/2008    Past Surgical History:  Procedure Laterality Date  . ABDOMINAL HYSTERECTOMY  1986  . APPENDECTOMY    . BREAST SURGERY Bilateral 1983   biopsy of each side, negative  . CARPAL TUNNEL RELEASE    . CATARACT EXTRACTION W/PHACO Left 09/03/2017   Procedure: CATARACT EXTRACTION PHACO AND INTRAOCULAR LENS PLACEMENT (IOC)-LEFT DIABETIC;  Surgeon: Birder Robson, MD;  Location: [facility] ORS;  Service: Ophthalmology;  Laterality: Left;  [place] 01:10.2 AP% 16.7 CDE 11.71 Fluid Pack Lot # [id number]  . CATARACT EXTRACTION W/PHACO Right 09/30/2017   Procedure: CATARACT EXTRACTION PHACO AND INTRAOCULAR LENS PLACEMENT (IOC);  Surgeon: Birder Robson, MD;  Location: [facility] ORS;  Service: Ophthalmology;  Laterality: Right;  [place] 01:35.1 AP% 13.1 CDE 12.44 Fluid pack Lot # [id number]  . COLON SURGERY    . EYE SURGERY Left 09/03/2017   Cataract extraction  . FEMUR FRACTURE SURGERY Left   . FRACTURE SURGERY  2001   left femur  . HEMORROIDECTOMY    . polyp removed from vocal cord      Prior to Admission medications   Medication Sig Start Date End Date Taking? Authorizing Provider  hydrOXYzine (ATARAX/VISTARIL) 10 MG tablet TAKE 1 TABLET BY MOUTH EVERYDAY AT BEDTIME 02/02/21   Sowles, Drue Stager, MD  acetaminophen-codeine (TYLENOL #3) 300-30 MG tablet TAKE 1 TABLET BY MOUTH EVERY 6 HOURS AS NEEDED FOR CHRONIC PAIN 01/10/21   Steele Sizer, MD  allopurinol (ZYLOPRIM) 100 MG tablet TAKE 1 TABLET BY MOUTH EVERY DAY 09/06/20   Edrick Kins, DPM  amLODipine (NORVASC) 10 MG tablet Take 1 tablet (10 mg total) by mouth daily. 10/10/20   Marrianne Mood D, PA-C  Blood Glucose Monitoring Suppl (ACCU-CHEK AVIVA PLUS) w/Device KIT 1 kit by Does not apply route 3 (three) times daily. 01/09/17   Steele Sizer, MD  carvedilol (COREG) 25 MG tablet Take 1 tablet (25 mg total) by mouth 2 (two) times daily. New dose 01/10/21   Sowles, Drue Stager, MD   clotrimazole-betamethasone (LOTRISONE) cream Apply 1 application topically 2 (two) times daily as needed. To affected skin 01/10/21   Steele Sizer, MD  COMBIGAN 0.2-0.5 % ophthalmic solution Place 1 drop into both eyes 2 (two) times daily.    [provider]  diclofenac Sodium (VOLTAREN) 1 % GEL Apply 2 g topically 3 (three) times daily as needed. 01/10/21   Steele Sizer, MD  Dulaglutide (TRULICITY) 3 EZ/5.0ZT SOPN Inject 3 mg as directed once a week. 09/12/20   Sowles, Drue Stager, MD  ELIQUIS 5 MG TABS tablet TAKE 1 TABLET BY MOUTH TWICE A DAY 11/02/20   Wellington Hampshire, MD  furosemide (LASIX) 20 MG tablet Take 1 tablet (20 mg total) by mouth daily. 01/10/21   Steele Sizer, MD  glucose blood (ACCU-CHEK AVIVA PLUS) test strip Check fasting glucose once each morning, and check glucose as needed. 09/12/20   Sowles, Drue Stager, MD  JARDIANCE 25 MG TABS tablet TAKE 25 MG BY MOUTH DAILY. 12/02/20   Steele Sizer, MD  Lancets (ACCU-CHEK MULTICLIX) lancets Use as instructed 01/09/17   Steele Sizer, MD  levothyroxine (SYNTHROID) 50 MCG tablet Take 1 tablet (50 mcg total) by mouth daily before breakfast. 09/12/20   Ancil Boozer, Drue Stager, MD  loratadine (CLARITIN) 10 MG tablet Take 1 tablet (10 mg total) by mouth daily. 01/10/21   Steele Sizer, MD  metFORMIN (GLUCOPHAGE) 850 MG tablet TAKE 1 TABLET (850 MG TOTAL) BY MOUTH 2 (TWO) TIMES DAILY. 09/05/20   Sowles, Drue Stager, MD  MITIGARE 0.6 MG CAPS TAKE 1-2 CAPSULES (0.6-1.2 MG TOTAL) BY MOUTH DAILY AS NEEDED. FOR GOUT, NOT DAILY 11/27/20   Steele Sizer, MD  nystatin (MYCOSTATIN/NYSTOP) powder Apply 1 application topically 2 (two) times daily as needed. 10/06/20   Delsa Grana, PA-C  olmesartan (BENICAR) 40 MG tablet Take 1 tablet (40 mg total) by mouth daily. 01/10/21   Steele Sizer, MD  potassium chloride SA (KLOR-CON) 20 MEQ tablet Take 1 tablet (20 mEq total) by mouth daily. 01/10/21   Steele Sizer, MD  pravastatin (PRAVACHOL) 40 MG tablet TAKE 1 TABLET  BY MOUTH EVERY DAY 09/05/20   Ancil Boozer, Drue Stager, MD  tacrolimus (PROTOPIC) 0.1 % ointment Apply topically as directed. Bid to rash under breast and in groin for 2 weeks, then decrease to qd until clear, then prn flares 01/22/21   Brendolyn Patty, MD  Travoprost, BAK Free, (TRAVATAN) 0.004 % SOLN ophthalmic solution Place 1 drop into both eyes at bedtime.    [provider]  Vitamin D, Ergocalciferol, (DRISDOL) 1.25 MG (50000 UNIT) CAPS capsule TAKE 1 CAPSULE BY MOUTH WEEKLY 09/12/20   Steele Sizer, MD    Allergies Augmentin [amoxicillin-pot clavulanate]  Family History  Problem Relation Age of Onset  . Diabetes Mother   . Hypertension Mother   . Dementia Mother   . Hypertension Son     Social History Social History   Tobacco Use  . Smoking status: Never Smoker  . Smokeless tobacco: Never Used  Vaping Use  . Vaping Use: Never used  Substance Use Topics  . Alcohol use: No    Alcohol/week: 0.0 standard drinks  . Drug use: No    Review of Systems Constitutional: No fever/chills Eyes: No visual changes. ENT: No sore throat. Cardiovascular: Denies chest pain. Positive for chest fluttering.  Respiratory: Denies shortness of breath. Gastrointestinal: No abdominal pain.  No nausea, no vomiting.  No diarrhea.   Genitourinary: Negative for dysuria. Musculoskeletal: Negative for back pain. Skin: Negative for rash. Neurological: Positive for near syncopal episodes.  ____________________________________________   PHYSICAL EXAM:  VITAL SIGNS: ED Triage Vitals  Enc Vitals Group     BP 03/09/21 1605 138/61     Pulse Rate 03/09/21 1605 75     Resp 03/09/21 1605 18     Temp 03/09/21 1605 98.2 F (36.8 C)     Temp Source 03/09/21 1605 Oral     SpO2 03/09/21 1605 95 %     Weight 03/09/21 1603 190 lb (86.2 kg)     Height 03/09/21 1603 5' 4"  (1.626 m)     Head Circumference --      Peak Flow --      Pain Score 03/09/21 1603 0   Constitutional: Alert and oriented.  Eyes:  Conjunctivae are normal.  ENT      Head: Normocephalic and atraumatic.      Nose: No congestion/rhinnorhea.      Mouth/Throat: Mucous membranes are moist.      Neck: No stridor. Hematological/Lymphatic/Immunilogical: No cervical lymphadenopathy. Cardiovascular: Normal rate,  regular rhythm.  No murmurs, rubs, or gallops.  Respiratory: Normal respiratory effort without tachypnea nor retractions. Breath sounds are clear and equal bilaterally. No wheezes/rales/rhonchi. Gastrointestinal: Soft and non tender. No rebound. No guarding.  Genitourinary: Deferred Musculoskeletal: Normal range of motion in all extremities. No lower extremity edema. Neurologic:  Normal speech and language. No gross focal neurologic deficits are appreciated.  Skin:  Skin is warm, dry and intact. No rash noted. Psychiatric: Mood and affect are normal. Speech and behavior are normal. Patient exhibits appropriate insight and judgment.  ____________________________________________    LABS (pertinent positives/negatives)  Trop hs 4 UA clear, >500, rbc wbc 0-5 CBC wbc 10.3, hgb 13.0, plt 271 BMP wnl except glu 151, cr 1.25  ____________________________________________   EKG  I, Nance Pear, attending physician, personally viewed and interpreted this EKG  EKG Time: 1606 Rate: 67 Rhythm: sinus rhythm Axis: normal Intervals: qtc 402 QRS: narrow ST changes: no st elevation Impression: normal ekg   ____________________________________________    RADIOLOGY  None  ____________________________________________   PROCEDURES  Procedures  ____________________________________________   INITIAL IMPRESSION / ASSESSMENT AND PLAN / ED COURSE  Pertinent labs & imaging results that were available during my care of the patient were reviewed by me and considered in my medical decision making (see chart for details).   Patient presented to the emergency department today because of concerns for episodes of  lightheadedness feeling like she might pass out.  Symptoms have been present over the past 2 weeks and been intermittent.  Work-up today without any clear etiology of patient's symptoms.  Note concerning anemia.  No normality that would explain it in the chemistry.  Troponin was negative.  No urinary tract infection.  Patient was observed here in the emergency department for a number of hours without any abnormal arrhythmias.  She was given some fluids and was able to ambulate to the bathroom without any significant difficulty.  This point given reassuring work-up do feel it is reasonable for patient be discharged home.  Did discuss return precautions.  ____________________________________________   FINAL CLINICAL IMPRESSION(S) / ED DIAGNOSES  Final diagnoses:  Lightheadedness     Note: This dictation was prepared with Dragon dictation. Any transcriptional errors that result from this process are unintentional     Nance Pear, MD 03/09/21 2250

## 2021-03-13 ENCOUNTER — Telehealth: Payer: Self-pay | Admitting: *Deleted

## 2021-03-13 NOTE — Telephone Encounter (Signed)
Called daughter yest. And spoke to her. She knows that her mother drinks water but not so much anything else. Eating is not good. I had spoke to pt last week and she was having dizziness, and she thought that it might be because of her low b12 number. Dr Janese Banks says that that would not cause her symptoms. I had rec: that pt. Call PCP, so they can check b/p  and labs to see if she is dehydration. I encouraged her to drink liquids  with nutrients it. There is smart water with extra mineral, gatorade, boost or ensure, pediatric pops if she can eat popsicle. She was having diarrhea but she said last week that it slowed down and she had stools but alittle loose but not watery. Daughter was going to check to see if se could drink more and encourage to call PCP

## 2021-03-13 NOTE — Telephone Encounter (Signed)
rcvd call from daughter Vaughan Basta about results from ER for her mother. I told her that the ER physician did run a troponin , as well as heart rate and EKG. They feel the troponin is negative and her EKG was good. I asked daughter if pt got appt with her PCP. She states she does not think so. She went to ER because of dizziness and she has some diarrhea. I told daughter that I would call pt and see if she is still having diarrhea. Dr. Janese Banks says that carcinoid can cause diarrhea and if it is still going on then she said that we can move up her scan to sooner date to check the status of carcinoid and also when she comes for scan that rao wants her to come by and get lab -chromogranin - that is the lab she checks to see about your carcinoid. I asked her to call me back with response about how she is. Left my direct number

## 2021-03-14 ENCOUNTER — Telehealth: Payer: Self-pay | Admitting: *Deleted

## 2021-03-14 NOTE — Telephone Encounter (Signed)
Called the pt. And wanted to check how she is doing ok today. She states that her stools are normal today and she did call PCP and she has appt.next tues. I told her that if she gets diarrhea to call us.

## 2021-03-17 ENCOUNTER — Other Ambulatory Visit: Payer: Self-pay | Admitting: Family Medicine

## 2021-03-17 DIAGNOSIS — E785 Hyperlipidemia, unspecified: Secondary | ICD-10-CM

## 2021-03-21 NOTE — Progress Notes (Signed)
Name: Susan Bowen   MRN: [id number]    DOB: [birth year]   Date:03/22/2021       Progress Note  Subjective  Chief Complaint  Acute visit for diarrhea   HPI  Diarrhea: she states symptoms started about one month ago. She states she had one episode of bowel incontinence - she woke up with stools all over her bed - Bristol type 6, she states that same night and day she had multiple episodes towards the end it was watery stools, no blood or mucus. Denies strong odor She states since that day she had 3-4 bowel movements per day up to yesterday when it was normal. She had two other episodes of  bowel incontinences in the last month - once at [facility] and another episode in her kitchen. She denies nausea, vomiting, she states only felt bloated once or twice and denies abdominal pain Her weight is stable She has carcinoid tumor. Read notes from oncologist and they advised to move appointment sooner for scan and extra labs.   B12 deficiency: last level 2 weeks ago was 120, getting B12 injections at oncologist   Dizziness: she states she feels lightheaded intermittently over the past two months, she feels like everything will go dark, it lasts seconds and resolves by itself. She went to [facility] and negative evaluation, except for urine showed some hematuria and we will send for culture today Mild drop of kidney function and recheck labs today   Patient Active Problem List   Diagnosis Date Noted  . Iron deficiency anemia 12/04/2020  . Carcinoid tumor, malignant ([place]) 09/08/2019  . Pain in joint of left shoulder 02/25/2019  . Pulmonary hypertension, unspecified ([place]) 12/28/2018  . Paroxysmal A-fib ([place]) 12/28/2018  . Chronic kidney disease, stage III (moderate) ([place]) 04/01/2018  . Elevated parathyroid hormone 01/01/2018  . Elevated uric acid in blood 04/29/2017  . Plantar fasciitis of left foot 03/12/2016  . Postprocedural hypothyroidism 01/23/2016  . Aortic stenosis 09/26/2015  . Allergic rhinitis,  seasonal 08/08/2015  . Benign hypertension 08/08/2015  . History of pneumonia 08/08/2015  . Carpal tunnel syndrome 08/08/2015  . Cataract 08/08/2015  . Insomnia, persistent 08/08/2015  . Dyslipidemia 08/08/2015  . History of depression 08/08/2015  . Glaucoma 08/08/2015  . Controlled gout 08/08/2015  . Fever blister 08/08/2015  . Bilateral hearing loss 08/08/2015  . Hemorrhoid 08/08/2015  . H/O iron deficiency anemia 08/08/2015  . Benign neoplasm of colon 08/08/2015  . Impingement syndrome of shoulder 08/08/2015  . Osteoporosis, post-menopausal 08/08/2015  . Morbid obesity ([place]) 08/08/2015  . Generalized OA 08/08/2015  . Multinodular goiter 08/08/2015  . Asymptomatic varicose veins 08/08/2015  . Polyp of vocal cord 08/08/2015  . History of vertebral compression fracture 08/08/2015  . Diabetes mellitus with proteinuric diabetic nephropathy ([place]) 08/08/2015  . LVH (left ventricular hypertrophy) 08/08/2015  . Moderate tricuspid regurgitation 08/08/2015  . History of radioactive iodine thyroid ablation 08/08/2015  . Lung nodule, solitary 05/12/2014  . Chronic cough 05/11/2014  . Vitamin D deficiency 12/20/2009  . Plantar fascial fibromatosis 12/20/2009  . Personal history of fall 07/20/2008  . Cervical radiculitis 05/11/2008    Past Surgical History:  Procedure Laterality Date  . ABDOMINAL HYSTERECTOMY  1986  . APPENDECTOMY    . BREAST SURGERY Bilateral 1983   biopsy of each side, negative  . CARPAL TUNNEL RELEASE    . CATARACT EXTRACTION W/PHACO Left 09/03/2017   Procedure: CATARACT EXTRACTION PHACO AND INTRAOCULAR LENS PLACEMENT (IOC)-LEFT DIABETIC;  Surgeon: Birder Robson, MD;  Location: [facility] ORS;  Service: Ophthalmology;  Laterality: Left;  [place] 01:10.2 AP% 16.7 CDE 11.71 Fluid Pack Lot # [id number]  . CATARACT EXTRACTION W/PHACO Right 09/30/2017   Procedure: CATARACT EXTRACTION PHACO AND INTRAOCULAR LENS PLACEMENT (IOC);  Surgeon: Birder Robson, MD;  Location: [facility]  ORS;  Service: Ophthalmology;  Laterality: Right;  [place] 01:35.1 AP% 13.1 CDE 12.44 Fluid pack Lot # [id number]  . COLON SURGERY    . EYE SURGERY Left 09/03/2017   Cataract extraction  . FEMUR FRACTURE SURGERY Left   . FRACTURE SURGERY  2001   left femur  . HEMORROIDECTOMY    . polyp removed from vocal cord      Family History  Problem Relation Age of Onset  . Diabetes Mother   . Hypertension Mother   . Dementia Mother   . Hypertension Son     Social History   Tobacco Use  . Smoking status: Never Smoker  . Smokeless tobacco: Never Used  Substance Use Topics  . Alcohol use: No    Alcohol/week: 0.0 standard drinks     Current Outpatient Medications:  .  acetaminophen-codeine (TYLENOL #3) 300-30 MG tablet, TAKE 1 TABLET BY MOUTH EVERY 6 HOURS AS NEEDED FOR CHRONIC PAIN, Disp: 30 tablet, Rfl: 0 .  allopurinol (ZYLOPRIM) 100 MG tablet, TAKE 1 TABLET BY MOUTH EVERY DAY, Disp: 90 tablet, Rfl: 1 .  amLODipine (NORVASC) 10 MG tablet, Take 1 tablet (10 mg total) by mouth daily., Disp: 90 tablet, Rfl: 3 .  amLODipine (NORVASC) 5 MG tablet, , Disp: , Rfl:  .  Blood Glucose Monitoring Suppl (ACCU-CHEK AVIVA PLUS) w/Device KIT, 1 kit by Does not apply route 3 (three) times daily., Disp: 1 kit, Rfl: 0 .  carvedilol (COREG) 25 MG tablet, Take 1 tablet (25 mg total) by mouth 2 (two) times daily. New dose, Disp: 180 tablet, Rfl: 1 .  cephALEXin (KEFLEX) 500 MG capsule, Take 500 mg by mouth every 12 (twelve) hours., Disp: , Rfl:  .  clotrimazole-betamethasone (LOTRISONE) cream, Apply 1 application topically 2 (two) times daily as needed. To affected skin, Disp: 45 g, Rfl: 0 .  COMBIGAN 0.2-0.5 % ophthalmic solution, Place 1 drop into both eyes 2 (two) times daily., Disp: , Rfl:  .  Dulaglutide (TRULICITY) 3 WJ/1.9JY SOPN, Inject 3 mg as directed once a week., Disp: 12 mL, Rfl: 1 .  ELIQUIS 5 MG TABS tablet, TAKE 1 TABLET BY MOUTH TWICE A DAY, Disp: 180 tablet, Rfl: 1 .  furosemide (LASIX) 20 MG  tablet, Take 1 tablet (20 mg total) by mouth daily., Disp: 30 tablet, Rfl: 3 .  glucose blood (ACCU-CHEK AVIVA PLUS) test strip, Check fasting glucose once each morning, and check glucose as needed., Disp: 100 each, Rfl: 12 .  hydrOXYzine (ATARAX/VISTARIL) 10 MG tablet, TAKE 1 TABLET BY MOUTH EVERYDAY AT BEDTIME, Disp: 90 tablet, Rfl: 0 .  JARDIANCE 25 MG TABS tablet, TAKE 25 MG BY MOUTH DAILY., Disp: 90 tablet, Rfl: 1 .  Lancets (ACCU-CHEK MULTICLIX) lancets, Use as instructed, Disp: 204 each, Rfl: 12 .  levothyroxine (SYNTHROID) 50 MCG tablet, Take 1 tablet (50 mcg total) by mouth daily before breakfast., Disp: 90 tablet, Rfl: 1 .  loratadine (CLARITIN) 10 MG tablet, Take 1 tablet (10 mg total) by mouth daily., Disp: 90 tablet, Rfl: 1 .  metFORMIN (GLUCOPHAGE) 850 MG tablet, TAKE 1 TABLET (850 MG TOTAL) BY MOUTH 2 (TWO) TIMES DAILY., Disp: 180 tablet, Rfl: 1 .  MITIGARE 0.6 MG CAPS, TAKE 1-2  CAPSULES (0.6-1.2 MG TOTAL) BY MOUTH DAILY AS NEEDED. FOR GOUT, NOT DAILY, Disp: 30 capsule, Rfl: 0 .  nystatin (MYCOSTATIN/NYSTOP) powder, Apply 1 application topically 2 (two) times daily as needed., Disp: 30 g, Rfl: 0 .  nystatin cream (MYCOSTATIN), APPLY TO AFFECTED AREA TWICE A DAY, Disp: , Rfl:  .  olmesartan (BENICAR) 20 MG tablet, Take 20 mg by mouth daily., Disp: , Rfl:  .  olmesartan (BENICAR) 40 MG tablet, Take 1 tablet (40 mg total) by mouth daily., Disp: 90 tablet, Rfl: 1 .  potassium chloride SA (KLOR-CON) 20 MEQ tablet, Take 1 tablet (20 mEq total) by mouth daily., Disp: 30 tablet, Rfl: 3 .  pravastatin (PRAVACHOL) 40 MG tablet, TAKE 1 TABLET BY MOUTH EVERY DAY, Disp: 90 tablet, Rfl: 2 .  tacrolimus (PROTOPIC) 0.1 % ointment, Apply topically as directed. Bid to rash under breast and in groin for 2 weeks, then decrease to qd until clear, then prn flares, Disp: 100 g, Rfl: 2 .  Travoprost, BAK Free, (TRAVATAN) 0.004 % SOLN ophthalmic solution, Place 1 drop into both eyes at bedtime., Disp: , Rfl:   .  Vitamin D, Ergocalciferol, (DRISDOL) 1.25 MG (50000 UNIT) CAPS capsule, TAKE 1 CAPSULE BY MOUTH WEEKLY, Disp: 12 capsule, Rfl: 1 .  diclofenac Sodium (VOLTAREN) 1 % GEL, Apply 2 g topically 3 (three) times daily as needed. (Patient not taking: Reported on 03/22/2021), Disp: 100 g, Rfl: 1 No current facility-administered medications for this visit.  Facility-Administered Medications Ordered in Other Visits:  .  cyanocobalamin ((VITAMIN B-12)) injection 1,000 mcg, 1,000 mcg, Intramuscular, Q30 days, Sindy Guadeloupe, MD, 1,000 mcg at 12/20/20 1414 .  cyanocobalamin ((VITAMIN B-12)) injection 1,000 mcg, 1,000 mcg, Intramuscular, Q30 days, Sindy Guadeloupe, MD, 1,000 mcg at 03/05/21 1341  Allergies  Allergen Reactions  . Augmentin [Amoxicillin-Pot Clavulanate] Itching    Has patient had a PCN reaction causing immediate rash, facial/tongue/throat swelling, SOB or lightheadedness with hypotension: No Has patient had a PCN reaction causing severe rash involving mucus membranes or skin necrosis: No Has patient had a PCN reaction that required hospitalization: No Has patient had a PCN reaction occurring within the last 10 years: No If all of the above answers are "NO", then may proceed with Cephalosporin use.     I personally reviewed active problem list, medication list, allergies, family history, social history, health maintenance with the patient/caregiver today.   ROS  Ten systems reviewed and is negative except as mentioned in HPI   Objective  Vitals:   03/22/21 1012  BP: 132/82  Pulse: 82  Resp: 16  Temp: 98.1 F (36.7 C)  TempSrc: Oral  SpO2: 94%  Weight: 194 lb (88 kg)  Height: 5' 4"  (1.626 m)    Body mass index is 33.3 kg/m.  Physical Exam  Constitutional: Patient appears well-developed and well-nourished. Obese  No distress.  HEENT: head atraumatic, normocephalic, pupils equal and reactive to light, neck supple Cardiovascular: Normal rate, regular rhythm and normal  heart sounds.  No murmur heard. Trace  BLE edema. She states left usually worse than right but gets better with elevation, no pain  Pulmonary/Chest: Effort normal and breath sounds normal. No respiratory distress. Abdominal: Soft.  There is no tenderness. Psychiatric: Patient has a normal mood and affect. behavior is normal. Judgment and thought content normal.  Recent Results (from the past 2160 hour(s))  POCT HgB A1C     Status: Abnormal   Collection Time: 01/10/21 10:46 AM  Result Value Ref  Range   Hemoglobin A1C 6.3 (A) 4.0 - 5.6 %   HbA1c POC (<> result, manual entry)     HbA1c, POC (prediabetic range)     HbA1c, POC (controlled diabetic range)    COMPLETE METABOLIC PANEL WITH GFR     Status: Abnormal   Collection Time: 01/10/21 11:52 AM  Result Value Ref Range   Glucose, Bld 103 (H) 65 - 99 mg/dL    Comment: .            Fasting reference interval . For someone without known diabetes, a glucose value between 100 and 125 mg/dL is consistent with prediabetes and should be confirmed with a follow-up test. .    BUN 8 7 - 25 mg/dL   Creat 1.00 (H) 0.60 - 0.88 mg/dL    Comment: For patients >26 years of age, the reference limit for Creatinine is approximately 13% higher for people identified as African-American. .    GFR, Est Non African American 51 (L) > OR = 60 mL/min/1.49m   GFR, Est African American 59 (L) > OR = 60 mL/min/1.758m  BUN/Creatinine Ratio 8 6 - 22 (calc)   Sodium 142 135 - 146 mmol/L   Potassium 4.4 3.5 - 5.3 mmol/L   Chloride 107 98 - 110 mmol/L   CO2 24 20 - 32 mmol/L   Calcium 10.0 8.6 - 10.4 mg/dL   Total Protein 6.6 6.1 - 8.1 g/dL   Albumin 4.0 3.6 - 5.1 g/dL   Globulin 2.6 1.9 - 3.7 g/dL (calc)   AG Ratio 1.5 1.0 - 2.5 (calc)   Total Bilirubin 0.4 0.2 - 1.2 mg/dL   Alkaline phosphatase (APISO) 59 37 - 153 U/L   AST 12 10 - 35 U/L   ALT 9 6 - 29 U/L  Lipid panel     Status: None   Collection Time: 01/10/21 11:52 AM  Result Value Ref Range    Cholesterol 130 <200 mg/dL   HDL 54 > OR = 50 mg/dL   Triglycerides 129 <150 mg/dL   LDL Cholesterol (Calc) 55 mg/dL (calc)    Comment: Reference range: <100 . Desirable range <100 mg/dL for primary prevention;   <70 mg/dL for patients with CHD or diabetic patients  with > or = 2 CHD risk factors. . Marland KitchenDL-C is now calculated using the Martin-Hopkins  calculation, which is a validated novel method providing  better accuracy than the Friedewald equation in the  estimation of LDL-C.  MaCresenciano Genret al. JAAnnamaria [id number];[id number]  (http://education.[facility].com/faq/FAQ164)    Total CHOL/HDL Ratio 2.4 <5.0 (calc)   Non-HDL Cholesterol (Calc) 76 <130 mg/dL (calc)    Comment: For patients with diabetes plus 1 major ASCVD risk  factor, treating to a non-HDL-C goal of <100 mg/dL  (LDL-C of <70 mg/dL) is considered a therapeutic  option.   Microalbumin / creatinine urine ratio     Status: Abnormal   Collection Time: 01/10/21 11:52 AM  Result Value Ref Range   Creatinine, Urine 60 20 - 275 mg/dL   Microalb, Ur 2.4 mg/dL    Comment: Reference Range Not established    Microalb Creat Ratio 40 (H) <30 mcg/mg creat    Comment: . The ADA defines abnormalities in albumin excretion as follows: . Marland Kitchenlbuminuria Category        Result (mcg/mg creatinine) . Normal to Mildly increased   <30 Moderately increased         30-299  Severely increased           >  OR = 300 . The ADA recommends that at least two of three specimens collected within a 3-6 month period be abnormal before considering a patient to be within a diagnostic category.   HM DIABETES EYE EXAM     Status: None   Collection Time: 01/31/21 12:00 AM  Result Value Ref Range   HM Diabetic Eye Exam No Retinopathy No Retinopathy  Vitamin B12     Status: Abnormal   Collection Time: 03/05/21  1:21 PM  Result Value Ref Range   Vitamin B-12 120 (L) 180 - 914 pg/mL    Comment: (NOTE) This assay is not validated for testing  neonatal or myeloproliferative syndrome specimens for Vitamin B12 levels. Performed at [facility], [place] [address], [place], [place] [address]   Iron and TIBC     Status: Abnormal   Collection Time: 03/05/21  1:21 PM  Result Value Ref Range   Iron 37 28 - 170 ug/dL   TIBC 374 250 - 450 ug/dL   Saturation Ratios 10 (L) 10.4 - 31.8 %   UIBC 337 ug/dL    Comment: Performed at [facility], [address] [place], [place] [address]  Ferritin     Status: None   Collection Time: 03/05/21  1:21 PM  Result Value Ref Range   Ferritin 54 11 - 307 ng/mL    Comment: Performed at [facility], [address] [place], [place] [address]  CBC with Differential     Status: Abnormal   Collection Time: 03/05/21  1:21 PM  Result Value Ref Range   WBC 9.3 4.0 - 10.5 K/uL   RBC 4.61 3.87 - 5.11 MIL/uL   Hemoglobin 12.9 12.0 - 15.0 g/dL   HCT 40.3 36.0 - 46.0 %   MCV 87.4 80.0 - 100.0 fL   MCH 28.0 26.0 - 34.0 pg   MCHC 32.0 30.0 - 36.0 g/dL   RDW 18.3 (H) 11.5 - 15.5 %   Platelets 270 150 - 400 K/uL   nRBC 0.0 0.0 - 0.2 %   Neutrophils Relative % 58 %   Neutro Abs 5.3 1.7 - 7.7 K/uL   Lymphocytes Relative 34 %   Lymphs Abs 3.2 0.7 - 4.0 K/uL   Monocytes Relative 6 %   Monocytes Absolute 0.6 0.1 - 1.0 K/uL   Eosinophils Relative 2 %   Eosinophils Absolute 0.2 0.0 - 0.5 K/uL   Basophils Relative 0 %   Basophils Absolute 0.0 0.0 - 0.1 K/uL   Immature Granulocytes 0 %   Abs Immature Granulocytes 0.04 0.00 - 0.07 K/uL    Comment: Performed at [facility], [address] [place], [place] [address]  Basic metabolic panel     Status: Abnormal   Collection Time: 03/09/21  4:50 PM  Result Value Ref Range   Sodium 138 135 - 145 mmol/L   Potassium 4.4 3.5 - 5.1 mmol/L   Chloride 103 98 - 111 mmol/L   CO2 26 22 - 32 mmol/L   Glucose, Bld 151 (H) 70 - 99 mg/dL    Comment: Glucose reference range applies only to samples taken after fasting for at least 8 hours.    BUN 14 8 - 23 mg/dL   Creatinine, Ser 1.25 (H) 0.44 - 1.00 mg/dL   Calcium 10.0 8.9 - 10.3 mg/dL   GFR, Estimated 42 (L) >60 mL/min    Comment: (NOTE) Calculated using the CKD-EPI Creatinine Equation (2021)    Anion gap 9 5 -  15    Comment: Performed at [facility], [address] [place], [place] [address]  CBC     Status: Abnormal   Collection Time: 03/09/21  4:50 PM  Result Value Ref Range   WBC 10.3 4.0 - 10.5 K/uL   RBC 4.74 3.87 - 5.11 MIL/uL   Hemoglobin 13.0 12.0 - 15.0 g/dL   HCT 41.4 36.0 - 46.0 %   MCV 87.3 80.0 - 100.0 fL   MCH 27.4 26.0 - 34.0 pg   MCHC 31.4 30.0 - 36.0 g/dL   RDW 17.6 (H) 11.5 - 15.5 %   Platelets 271 150 - 400 K/uL   nRBC 0.0 0.0 - 0.2 %    Comment: Performed at [facility], [address] [place], [place] [address]  Urinalysis, Complete w Microscopic     Status: Abnormal   Collection Time: 03/09/21  4:50 PM  Result Value Ref Range   Color, Urine STRAW (A) YELLOW   APPearance CLEAR (A) CLEAR   Specific Gravity, Urine 1.007 1.005 - 1.030   pH 5.0 5.0 - 8.0   Glucose, UA >=500 (A) NEGATIVE mg/dL   Hgb urine dipstick LARGE (A) NEGATIVE   Bilirubin Urine NEGATIVE NEGATIVE   Ketones, ur NEGATIVE NEGATIVE mg/dL   Protein, ur NEGATIVE NEGATIVE mg/dL   Nitrite NEGATIVE NEGATIVE   Leukocytes,Ua NEGATIVE NEGATIVE   RBC / HPF 0-5 0 - 5 RBC/hpf   WBC, UA 0-5 0 - 5 WBC/hpf   Bacteria, UA NONE SEEN NONE SEEN   Squamous Epithelial / LPF NONE SEEN 0 - 5   Mucus PRESENT     Comment: Performed at [facility], [address] [place], [place] [address]  CBG monitoring, ED     Status: Abnormal   Collection Time: 03/09/21  4:50 PM  Result Value Ref Range   Glucose-Capillary 142 (H) 70 - 99 mg/dL    Comment: Glucose reference range applies only to samples taken after fasting for at least 8 hours.  Troponin I (High Sensitivity)     Status: None   Collection Time: 03/09/21  5:32 PM  Result Value Ref Range   Troponin I (High  Sensitivity) 4 <18 ng/L    Comment: (NOTE) Elevated high sensitivity troponin I (hsTnI) values and significant  changes across serial measurements may suggest ACS but many other  chronic and acute conditions are known to elevate hsTnI results.  Refer to the "Links" section for chest pain algorithms and additional  guidance. Performed at [facility], [address] [place], [place] [address]       PHQ2/9: Depression screen [facility] 2/9 03/22/2021 01/10/2021 10/16/2020 10/06/2020 09/12/2020  Decreased Interest 0 0 0 0 0  Down, Depressed, Hopeless 0 0 0 0 0  PHQ - 2 Score 0 0 0 0 0  Altered sleeping - - - - -  Tired, decreased energy - - - - -  Change in appetite - - - - -  Feeling bad or failure about yourself  - - - - -  Trouble concentrating - - - - -  Moving slowly or fidgety/restless - - - - -  Suicidal thoughts - - - - -  PHQ-9 Score - - - - -  Difficult doing work/chores - - - - -  Some recent data might be hidden    phq 9 is negative  Fall Risk: Fall Risk  03/22/2021 01/10/2021 10/16/2020 10/06/2020 09/12/2020  Falls in the past year? 0 0 0 0  0  Number falls in past yr: 0 0 0 0 0  Injury with Fall? 0 0 0 0 0  Risk for fall due to : - - - - -  Follow up - - - Falls evaluation completed -     Functional Status Survey: Is the patient deaf or have difficulty hearing?: Yes Does the patient have difficulty seeing, even when wearing glasses/contacts?: Yes Does the patient have difficulty concentrating, remembering, or making decisions?: No Does the patient have difficulty walking or climbing stairs?: Yes Does the patient have difficulty dressing or bathing?: No Does the patient have difficulty doing errands alone such as visiting a doctor's office or shopping?: No   Assessment & Plan  1. Dizziness   2. Diarrhea, unspecified type  It may be carcinoid syndrome   3. Full incontinence of feces  Wearing depends   4. Malignant carcinoid tumor of small intestine,  unspecified location [facility])  Recommend earlier evaluation by hematologist   5. B12 deficiency  Continue B12 injections   6. Stage 3a chronic kidney disease (HCC)  - BASIC METABOLIC PANEL WITH GFR  7. Hypothyroidism, postablative  - TSH  8. Other microscopic hematuria  - Urinalysis, Complete - CULTURE, URINE COMPREHENSIVE

## 2021-03-22 ENCOUNTER — Ambulatory Visit (INDEPENDENT_AMBULATORY_CARE_PROVIDER_SITE_OTHER): Payer: Medicare HMO | Admitting: Family Medicine

## 2021-03-22 ENCOUNTER — Other Ambulatory Visit: Payer: Self-pay

## 2021-03-22 ENCOUNTER — Encounter: Payer: Self-pay | Admitting: Family Medicine

## 2021-03-22 VITALS — BP 132/82 | HR 82 | Temp 98.1°F | Resp 16 | Ht 64.0 in | Wt 194.0 lb

## 2021-03-22 DIAGNOSIS — E1121 Type 2 diabetes mellitus with diabetic nephropathy: Secondary | ICD-10-CM

## 2021-03-22 DIAGNOSIS — R3129 Other microscopic hematuria: Secondary | ICD-10-CM | POA: Diagnosis not present

## 2021-03-22 DIAGNOSIS — R159 Full incontinence of feces: Secondary | ICD-10-CM | POA: Diagnosis not present

## 2021-03-22 DIAGNOSIS — N1831 Chronic kidney disease, stage 3a: Secondary | ICD-10-CM | POA: Diagnosis not present

## 2021-03-22 DIAGNOSIS — C7A019 Malignant carcinoid tumor of the small intestine, unspecified portion: Secondary | ICD-10-CM | POA: Diagnosis not present

## 2021-03-22 DIAGNOSIS — R42 Dizziness and giddiness: Secondary | ICD-10-CM | POA: Diagnosis not present

## 2021-03-22 DIAGNOSIS — R197 Diarrhea, unspecified: Secondary | ICD-10-CM

## 2021-03-22 DIAGNOSIS — E890 Postprocedural hypothyroidism: Secondary | ICD-10-CM

## 2021-03-22 DIAGNOSIS — E538 Deficiency of other specified B group vitamins: Secondary | ICD-10-CM

## 2021-03-22 DIAGNOSIS — Z85040 Personal history of malignant carcinoid tumor of rectum: Secondary | ICD-10-CM

## 2021-03-23 ENCOUNTER — Telehealth: Payer: Self-pay | Admitting: Oncology

## 2021-03-23 NOTE — Telephone Encounter (Signed)
03/23/2021 Spoke w/ pt and informed her of labs and PET scan on 5/25 per Dr. Janese Banks. Informed pt of prep instructions; NPO 2 hrs prior except for water, and location of appts. Also let her know of the f/u appt with Dr. Rogue Bussing on 6/1 to go over the results while Dr. Janese Banks is out of town. Pt confirmed all appts  SRW

## 2021-03-24 LAB — BASIC METABOLIC PANEL WITH GFR
BUN/Creatinine Ratio: 11 (calc) (ref 6–22)
BUN: 14 mg/dL (ref 7–25)
CO2: 26 mmol/L (ref 20–32)
Calcium: 9.7 mg/dL (ref 8.6–10.4)
Chloride: 103 mmol/L (ref 98–110)
Creat: 1.23 mg/dL — ABNORMAL HIGH (ref 0.60–0.88)
GFR, Est African American: 46 mL/min/{1.73_m2} — ABNORMAL LOW (ref >=60)
GFR, Est Non African American: 39 mL/min/{1.73_m2} — ABNORMAL LOW (ref >=60)
Glucose, Bld: 106 mg/dL — ABNORMAL HIGH (ref 65–99)
Potassium: 4.2 mmol/L (ref 3.5–5.3)
Sodium: 138 mmol/L (ref 135–146)

## 2021-03-24 LAB — CULTURE, URINE COMPREHENSIVE
MICRO NUMBER:: 11913524
RESULT:: NO GROWTH
SPECIMEN QUALITY:: ADEQUATE

## 2021-03-24 LAB — URINALYSIS, COMPLETE
Bacteria, UA: NONE SEEN /HPF
Bilirubin Urine: NEGATIVE
Hgb urine dipstick: NEGATIVE
Hyaline Cast: NONE SEEN /LPF
Ketones, ur: NEGATIVE
Leukocytes,Ua: NEGATIVE
Nitrite: NEGATIVE
Protein, ur: NEGATIVE
RBC / HPF: NONE SEEN /HPF (ref 0–2)
Specific Gravity, Urine: 1.011 (ref 1.001–1.035)
Squamous Epithelial / HPF: NONE SEEN /HPF (ref <=5)
WBC, UA: NONE SEEN /HPF (ref 0–5)
pH: <=5 (ref 5.0–8.0)

## 2021-03-24 LAB — TSH: TSH: 9.07 mIU/L — ABNORMAL HIGH (ref 0.40–4.50)

## 2021-03-26 ENCOUNTER — Other Ambulatory Visit: Payer: Self-pay | Admitting: Family Medicine

## 2021-03-26 DIAGNOSIS — E890 Postprocedural hypothyroidism: Secondary | ICD-10-CM

## 2021-03-26 MED ORDER — LEVOTHYROXINE SODIUM 50 MCG PO TABS: 50.0000 ug | ORAL_TABLET | Freq: Every day | ORAL | 1 refills | Status: DC

## 2021-03-26 NOTE — Progress Notes (Signed)
Spoke with pharmacist and patient picked up Benicar 40mg  qd on 02/27/2021 for 90day supply, Norvasc 10mg  qd on 01/22/2021 for 90 day supply. Patient states she takes all her medication consistently and is taking her Levothyroxine as prescribed at 62mcg daily.

## 2021-03-28 ENCOUNTER — Inpatient Hospital Stay: Payer: Medicare HMO

## 2021-03-28 ENCOUNTER — Ambulatory Visit
Admission: RE | Admit: 2021-03-28 | Discharge: 2021-03-28 | Disposition: A | Discharge status: Home / Self Care | Payer: Medicare HMO | Source: Ambulatory Visit | Attending: Oncology | Admitting: Oncology

## 2021-03-28 ENCOUNTER — Other Ambulatory Visit: Payer: Self-pay

## 2021-03-28 DIAGNOSIS — D509 Iron deficiency anemia, unspecified: Secondary | ICD-10-CM | POA: Diagnosis not present

## 2021-03-28 DIAGNOSIS — Z85040 Personal history of malignant carcinoid tumor of rectum: Secondary | ICD-10-CM | POA: Diagnosis not present

## 2021-03-28 DIAGNOSIS — K449 Diaphragmatic hernia without obstruction or gangrene: Secondary | ICD-10-CM | POA: Diagnosis not present

## 2021-03-28 DIAGNOSIS — C7A026 Malignant carcinoid tumor of the rectum: Secondary | ICD-10-CM | POA: Diagnosis not present

## 2021-03-28 DIAGNOSIS — Z79899 Other long term (current) drug therapy: Secondary | ICD-10-CM | POA: Diagnosis not present

## 2021-03-28 DIAGNOSIS — E538 Deficiency of other specified B group vitamins: Secondary | ICD-10-CM | POA: Diagnosis not present

## 2021-03-28 DIAGNOSIS — I700 Atherosclerosis of aorta: Secondary | ICD-10-CM | POA: Diagnosis not present

## 2021-03-28 DIAGNOSIS — I2510 Atherosclerotic heart disease of native coronary artery without angina pectoris: Secondary | ICD-10-CM | POA: Diagnosis not present

## 2021-03-28 LAB — CBC WITH DIFFERENTIAL/PLATELET
Abs Immature Granulocytes: 0.02 10*3/uL (ref 0.00–0.07)
Basophils Absolute: 0 10*3/uL (ref 0.0–0.1)
Basophils Relative: 0 %
Eosinophils Absolute: 0.2 10*3/uL (ref 0.0–0.5)
Eosinophils Relative: 2 %
HCT: 40.1 % (ref 36.0–46.0)
Hemoglobin: 12.8 g/dL (ref 12.0–15.0)
Immature Granulocytes: 0 %
Lymphocytes Relative: 27 %
Lymphs Abs: 2.4 10*3/uL (ref 0.7–4.0)
MCH: 27.9 pg (ref 26.0–34.0)
MCHC: 31.9 g/dL (ref 30.0–36.0)
MCV: 87.4 fL (ref 80.0–100.0)
Monocytes Absolute: 0.5 10*3/uL (ref 0.1–1.0)
Monocytes Relative: 5 %
Neutro Abs: 5.8 10*3/uL (ref 1.7–7.7)
Neutrophils Relative %: 66 %
Platelets: 280 10*3/uL (ref 150–400)
RBC: 4.59 MIL/uL (ref 3.87–5.11)
RDW: 17 % — ABNORMAL HIGH (ref 11.5–15.5)
WBC: 8.9 10*3/uL (ref 4.0–10.5)
nRBC: 0 % (ref 0.0–0.2)

## 2021-03-28 LAB — COMPREHENSIVE METABOLIC PANEL
ALT: 11 U/L (ref 0–44)
AST: 16 U/L (ref 15–41)
Albumin: 3.8 g/dL (ref 3.5–5.0)
Alkaline Phosphatase: 55 U/L (ref 38–126)
Anion gap: 11 (ref 5–15)
BUN: 14 mg/dL (ref 8–23)
CO2: 25 mmol/L (ref 22–32)
Calcium: 9.5 mg/dL (ref 8.9–10.3)
Chloride: 102 mmol/L (ref 98–111)
Creatinine, Ser: 1.18 mg/dL — ABNORMAL HIGH (ref 0.44–1.00)
GFR, Estimated: 45 mL/min — ABNORMAL LOW (ref >60)
Glucose, Bld: 135 mg/dL — ABNORMAL HIGH (ref 70–99)
Potassium: 4.1 mmol/L (ref 3.5–5.1)
Sodium: 138 mmol/L (ref 135–145)
Total Bilirubin: 0.6 mg/dL (ref 0.3–1.2)
Total Protein: 7.1 g/dL (ref 6.5–8.1)

## 2021-03-28 MED ORDER — GALLIUM GA 68 DOTATATE IV KIT
4.8400 | PACK | Freq: Once | INTRAVENOUS | Status: AC | PRN
  Administered 2021-03-28: 4.84 via INTRAVENOUS

## 2021-04-02 LAB — CHROMOGRANIN A: Chromogranin A (ng/mL): 256.4 ng/mL — ABNORMAL HIGH (ref 0.0–101.8)

## 2021-04-04 ENCOUNTER — Other Ambulatory Visit: Payer: Self-pay

## 2021-04-04 ENCOUNTER — Inpatient Hospital Stay: Payer: Medicare HMO | Attending: Oncology | Admitting: Internal Medicine

## 2021-04-04 ENCOUNTER — Encounter: Payer: Self-pay | Admitting: Internal Medicine

## 2021-04-04 VITALS — BP 118/71 | HR 18 | Temp 97.3°F | Wt 195.0 lb

## 2021-04-04 DIAGNOSIS — I480 Paroxysmal atrial fibrillation: Secondary | ICD-10-CM | POA: Diagnosis not present

## 2021-04-04 DIAGNOSIS — E039 Hypothyroidism, unspecified: Secondary | ICD-10-CM | POA: Insufficient documentation

## 2021-04-04 DIAGNOSIS — K219 Gastro-esophageal reflux disease without esophagitis: Secondary | ICD-10-CM | POA: Diagnosis not present

## 2021-04-04 DIAGNOSIS — D509 Iron deficiency anemia, unspecified: Secondary | ICD-10-CM | POA: Insufficient documentation

## 2021-04-04 DIAGNOSIS — Z7984 Long term (current) use of oral hypoglycemic drugs: Secondary | ICD-10-CM | POA: Diagnosis not present

## 2021-04-04 DIAGNOSIS — E785 Hyperlipidemia, unspecified: Secondary | ICD-10-CM | POA: Insufficient documentation

## 2021-04-04 DIAGNOSIS — G4730 Sleep apnea, unspecified: Secondary | ICD-10-CM | POA: Diagnosis not present

## 2021-04-04 DIAGNOSIS — I700 Atherosclerosis of aorta: Secondary | ICD-10-CM | POA: Diagnosis not present

## 2021-04-04 DIAGNOSIS — E559 Vitamin D deficiency, unspecified: Secondary | ICD-10-CM | POA: Insufficient documentation

## 2021-04-04 DIAGNOSIS — R197 Diarrhea, unspecified: Secondary | ICD-10-CM | POA: Diagnosis not present

## 2021-04-04 DIAGNOSIS — I10 Essential (primary) hypertension: Secondary | ICD-10-CM | POA: Diagnosis not present

## 2021-04-04 DIAGNOSIS — E538 Deficiency of other specified B group vitamins: Secondary | ICD-10-CM | POA: Diagnosis not present

## 2021-04-04 DIAGNOSIS — K449 Diaphragmatic hernia without obstruction or gangrene: Secondary | ICD-10-CM | POA: Diagnosis not present

## 2021-04-04 DIAGNOSIS — M810 Age-related osteoporosis without current pathological fracture: Secondary | ICD-10-CM | POA: Diagnosis not present

## 2021-04-04 DIAGNOSIS — Z79899 Other long term (current) drug therapy: Secondary | ICD-10-CM | POA: Diagnosis not present

## 2021-04-04 DIAGNOSIS — Z8673 Personal history of transient ischemic attack (TIA), and cerebral infarction without residual deficits: Secondary | ICD-10-CM | POA: Insufficient documentation

## 2021-04-04 DIAGNOSIS — Z7901 Long term (current) use of anticoagulants: Secondary | ICD-10-CM | POA: Diagnosis not present

## 2021-04-04 DIAGNOSIS — C7A019 Malignant carcinoid tumor of the small intestine, unspecified portion: Secondary | ICD-10-CM | POA: Diagnosis not present

## 2021-04-04 DIAGNOSIS — C7A1 Malignant poorly differentiated neuroendocrine tumors: Secondary | ICD-10-CM | POA: Insufficient documentation

## 2021-04-04 DIAGNOSIS — M1990 Unspecified osteoarthritis, unspecified site: Secondary | ICD-10-CM | POA: Diagnosis not present

## 2021-04-04 DIAGNOSIS — E119 Type 2 diabetes mellitus without complications: Secondary | ICD-10-CM | POA: Diagnosis not present

## 2021-04-04 NOTE — Progress Notes (Signed)
Pt in for PET scan results, reports has had some issues with diarrhea and dizziness.

## 2021-04-04 NOTE — Assessment & Plan Note (Addendum)
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  May 2021 PET scan shows stable right lower quadrant mesenteric lesion; however new uptake noted in the right lower quadrant recently; and also possible 2 foci localizing small bowel loop.  Chromogranin A rising.  #Discussed regarding the use of Sandostatin 20 mg IM monthly basis to help slow down the growth of disease/also help with diarrhea.  I discussed the potential side effects including but not limited to abdominal bloating; dyspepsia and gallbladder sludge.  Patient will discuss further with Dr. Janese Banks.   # Chronic diarrhea- [> 1 year]- stool incontinence 4-5 times last 1 months-question related to carcinoid disease.  Check urine 24-hour HIAA collection.  #B12 deficiency-May 2022-120; continue IM B12; also p.o. B12  #Iron deficient anemia hemoglobin 12.5; hold off any iron infusion at this time.   # DISPOSITION: # 24 hour urine collection- jug today # follow up in 3 weeks- Dr.Rao; possible Venofer; B12 injection; possible sandostatin-Dr.B

## 2021-04-04 NOTE — Progress Notes (Signed)
[facility] NOTE  Patient Care Team: Steele Sizer, MD as PCP - General (Family Medicine) Wellington Hampshire, MD as PCP - Cardiology (Cardiology) Sindy Guadeloupe, MD as Consulting Physician (Oncology)  CHIEF COMPLAINTS/PURPOSE OF CONSULTATION:   #  Oncology History   No history exists.     HISTORY OF PRESENTING ILLNESS:  Susan Bowen 87 y.o.  female history of metastatic carcinoid currently on surveillance is here for follow-up/review results of the PET scan.  Patient states that she has been in the hospital for "dehydration" approximately 3 weeks ago.   Patient admits to diarrhea intermittent episodic for the last year or so.  No triggers.  Denies any flushing.  No abdominal pain.  No nausea or vomiting.  Review of Systems  Constitutional: Positive for malaise/fatigue. Negative for chills, diaphoresis, fever and weight loss.  HENT: Negative for nosebleeds and sore throat.   Eyes: Negative for double vision.  Respiratory: Negative for cough, hemoptysis, sputum production, shortness of breath and wheezing.   Cardiovascular: Negative for chest pain, palpitations, orthopnea and leg swelling.  Gastrointestinal: Positive for diarrhea. Negative for abdominal pain, blood in stool, constipation, heartburn, melena, nausea and vomiting.  Genitourinary: Negative for dysuria, frequency and urgency.  Musculoskeletal: Positive for joint pain. Negative for back pain.  Skin: Negative.  Negative for itching and rash.  Neurological: Negative for dizziness, tingling, focal weakness, weakness and headaches.  Endo/Heme/Allergies: Does not bruise/bleed easily.  Psychiatric/Behavioral: Negative for depression. The patient is not nervous/anxious and does not have insomnia.      MEDICAL HISTORY:  Past Medical History:  Diagnosis Date  . Allergy   . Back spasm   . Bunion   . Carpal tunnel syndrome   . Cataracta   . Diabetes mellitus without complication ([place])   .  Generalized osteoarthritis   . GERD (gastroesophageal reflux disease)   . Gout   . Hemorrhoids without complication   . Hyperlipidemia   . Hypertension   . Hypothyroidism   . Impingement syndrome of right shoulder   . Insomnia   . Lipoma   . Lung nodule   . Metastatic carcinoid tumor ([place]) 03/2016  . Neuritis or radiculitis due to rupture of lumbar intervertebral disc   . Obesity   . Osteoporosis   . PAF (paroxysmal atrial fibrillation) ([place])    a. 03/2018 AF w/ RVR-->converted spont.  CHA2DS2VASc = 7-->Eliquis.  . Proteinuria   . Rotator cuff tear   . Systolic murmur    a. 07/5283 Echo: EF 60-65%, no rwma, Gr1 DD, mild MR, mildly dil LA. Nl RV fxn. PASP 56mHg.  .Marland KitchenTIA (transient ischemic attack)   . TIA (transient ischemic attack) 1998   small TIA.  no residual effects  . Tinnitus of both ears   . Unspecified glaucoma(365.9)   . Vitamin D deficiency   . Wedge compression fracture of t11-T12 vertebra, sequela     SURGICAL HISTORY: Past Surgical History:  Procedure Laterality Date  . ABDOMINAL HYSTERECTOMY  1986  . APPENDECTOMY    . BREAST SURGERY Bilateral 1983   biopsy of each side, negative  . CARPAL TUNNEL RELEASE    . CATARACT EXTRACTION W/PHACO Left 09/03/2017   Procedure: CATARACT EXTRACTION PHACO AND INTRAOCULAR LENS PLACEMENT (IOC)-LEFT DIABETIC;  Surgeon: PBirder Robson MD;  Location: [facility] ORS;  Service: Ophthalmology;  Laterality: Left;  [place]:10.2 AP% 16.7 CDE 11.71 Fluid Pack Lot # [id number] . CATARACT EXTRACTION W/PHACO Right 09/30/2017   Procedure: CATARACT EXTRACTION  PHACO AND INTRAOCULAR LENS PLACEMENT (IOC);  Surgeon: Birder Robson, MD;  Location: [facility] ORS;  Service: Ophthalmology;  Laterality: Right;  [place] 01:35.1 AP% 13.1 CDE 12.44 Fluid pack Lot # [id number]  . COLON SURGERY    . EYE SURGERY Left 09/03/2017   Cataract extraction  . FEMUR FRACTURE SURGERY Left   . FRACTURE SURGERY  2001   left femur  . HEMORROIDECTOMY    . polyp removed from  vocal cord      SOCIAL HISTORY: Social History   Socioeconomic History  . Marital status: Divorced    Spouse name: Not on file  . Number of children: 6  . Years of education: Not on file  . Highest education level: 10th grade  Occupational History  . Not on file  Tobacco Use  . Smoking status: Never Smoker  . Smokeless tobacco: Never Used  Vaping Use  . Vaping Use: Never used  Substance and Sexual Activity  . Alcohol use: No    Alcohol/week: 0.0 standard drinks  . Drug use: No  . Sexual activity: Not Currently  Other Topics Concern  . Not on file  Social History Narrative   Pt lives with her son; independent ADL's, no longer drives   Social Determinants of Health   Financial Resource Strain: Low Risk   . Difficulty of Paying Living Expenses: Not very hard  Food Insecurity: No Food Insecurity  . Worried About Charity fundraiser in the Last Year: Never true  . Ran Out of Food in the Last Year: Never true  Transportation Needs: No Transportation Needs  . Lack of Transportation (Medical): No  . Lack of Transportation (Non-Medical): No  Physical Activity: Insufficiently Active  . Days of Exercise per Week: 7 days  . Minutes of Exercise per Session: 10 min  Stress: No Stress Concern Present  . Feeling of Stress : Not at all  Social Connections: Moderately Integrated  . Frequency of Communication with Friends and Family: More than three times a week  . Frequency of Social Gatherings with Friends and Family: Three times a week  . Attends Religious Services: More than 4 times per year  . Active Member of Clubs or Organizations: Yes  . Attends Archivist Meetings: 1 to 4 times per year  . Marital Status: Divorced  Human resources officer Violence: Not At Risk  . Fear of Current or Ex-Partner: No  . Emotionally Abused: No  . Physically Abused: No  . Sexually Abused: No    FAMILY HISTORY: Family History  Problem Relation Age of Onset  . Diabetes Mother   .  Hypertension Mother   . Dementia Mother   . Hypertension Son     ALLERGIES:  is allergic to augmentin [amoxicillin-pot clavulanate].  MEDICATIONS:  Current Outpatient Medications  Medication Sig Dispense Refill  . acetaminophen-codeine (TYLENOL #3) 300-30 MG tablet TAKE 1 TABLET BY MOUTH EVERY 6 HOURS AS NEEDED FOR CHRONIC PAIN 30 tablet 0  . allopurinol (ZYLOPRIM) 100 MG tablet TAKE 1 TABLET BY MOUTH EVERY DAY 90 tablet 1  . amLODipine (NORVASC) 10 MG tablet Take 1 tablet (10 mg total) by mouth daily. 90 tablet 3  . Blood Glucose Monitoring Suppl (ACCU-CHEK AVIVA PLUS) w/Device KIT 1 kit by Does not apply route 3 (three) times daily. 1 kit 0  . carvedilol (COREG) 25 MG tablet Take 1 tablet (25 mg total) by mouth 2 (two) times daily. New dose 180 tablet 1  . COMBIGAN 0.2-0.5 % ophthalmic solution Place  1 drop into both eyes 2 (two) times daily.    . Dulaglutide (TRULICITY) 3 MW/4.1LK SOPN Inject 3 mg as directed once a week. 12 mL 1  . ELIQUIS 5 MG TABS tablet TAKE 1 TABLET BY MOUTH TWICE A DAY 180 tablet 1  . furosemide (LASIX) 20 MG tablet Take 1 tablet (20 mg total) by mouth daily. 30 tablet 3  . glucose blood (ACCU-CHEK AVIVA PLUS) test strip Check fasting glucose once each morning, and check glucose as needed. 100 each 12  . hydrOXYzine (ATARAX/VISTARIL) 10 MG tablet TAKE 1 TABLET BY MOUTH EVERYDAY AT BEDTIME 90 tablet 0  . JARDIANCE 25 MG TABS tablet TAKE 25 MG BY MOUTH DAILY. 90 tablet 1  . Lancets (ACCU-CHEK MULTICLIX) lancets Use as instructed 204 each 12  . levothyroxine (SYNTHROID) 50 MCG tablet Take 1 tablet (50 mcg total) by mouth daily before breakfast. And take two tablets twice a week 114 tablet 1  . loratadine (CLARITIN) 10 MG tablet Take 1 tablet (10 mg total) by mouth daily. 90 tablet 1  . metFORMIN (GLUCOPHAGE) 850 MG tablet TAKE 1 TABLET (850 MG TOTAL) BY MOUTH 2 (TWO) TIMES DAILY. 180 tablet 1  . MITIGARE 0.6 MG CAPS TAKE 1-2 CAPSULES (0.6-1.2 MG TOTAL) BY MOUTH DAILY  AS NEEDED. FOR GOUT, NOT DAILY 30 capsule 0  . nystatin (MYCOSTATIN/NYSTOP) powder Apply 1 application topically 2 (two) times daily as needed. 30 g 0  . nystatin cream (MYCOSTATIN) APPLY TO AFFECTED AREA TWICE A DAY    . olmesartan (BENICAR) 40 MG tablet Take 1 tablet (40 mg total) by mouth daily. 90 tablet 1  . potassium chloride SA (KLOR-CON) 20 MEQ tablet Take 1 tablet (20 mEq total) by mouth daily. 30 tablet 3  . pravastatin (PRAVACHOL) 40 MG tablet TAKE 1 TABLET BY MOUTH EVERY DAY 90 tablet 2  . tacrolimus (PROTOPIC) 0.1 % ointment Apply topically as directed. Bid to rash under breast and in groin for 2 weeks, then decrease to qd until clear, then prn flares 100 g 2  . Travoprost, BAK Free, (TRAVATAN) 0.004 % SOLN ophthalmic solution Place 1 drop into both eyes at bedtime.    . Vitamin D, Ergocalciferol, (DRISDOL) 1.25 MG (50000 UNIT) CAPS capsule TAKE 1 CAPSULE BY MOUTH WEEKLY 12 capsule 1   No current facility-administered medications for this visit.   Facility-Administered Medications Ordered in Other Visits  Medication Dose Route Frequency Provider Last Rate Last Admin  . cyanocobalamin ((VITAMIN B-12)) injection 1,000 mcg  1,000 mcg Intramuscular Q30 days Sindy Guadeloupe, MD   1,000 mcg at 12/20/20 1414  . cyanocobalamin ((VITAMIN B-12)) injection 1,000 mcg  1,000 mcg Intramuscular Q30 days Sindy Guadeloupe, MD   1,000 mcg at 03/05/21 1341      .  PHYSICAL EXAMINATION: ECOG PERFORMANCE STATUS: 1 - Symptomatic but completely ambulatory  Vitals:   04/04/21 1117  BP: 118/71  Pulse: (!) 18  Temp: (!) 97.3 F (36.3 C)  SpO2: 94%   Filed Weights   04/04/21 1117  Weight: 88.5 kg    Physical Exam Constitutional:      Comments: Ambulating independently.  Accompanied by daughter.  HENT:     Head: Normocephalic and atraumatic.     Mouth/Throat:     Pharynx: No oropharyngeal exudate.  Eyes:     Pupils: Pupils are equal, round, and reactive to light.  Cardiovascular:      Rate and Rhythm: Normal rate and regular rhythm.  Pulmonary:  Effort: Pulmonary effort is normal. No respiratory distress.     Breath sounds: Normal breath sounds. No wheezing.  Abdominal:     General: Bowel sounds are normal. There is no distension.     Palpations: Abdomen is soft. There is no mass.     Tenderness: There is no abdominal tenderness. There is no guarding or rebound.  Musculoskeletal:        General: No tenderness. Normal range of motion.     Cervical back: Normal range of motion and neck supple.  Skin:    General: Skin is warm.  Neurological:     Mental Status: She is alert and oriented to person, place, and time.  Psychiatric:        Mood and Affect: Affect normal.      LABORATORY DATA:  I have reviewed the data as listed Lab Results  Component Value Date   WBC 8.9 03/28/2021   HGB 12.8 03/28/2021   HCT 40.1 03/28/2021   MCV 87.4 03/28/2021   PLT 280 03/28/2021   Recent Labs    07/27/20 1455 08/17/20 1146 09/08/20 1013 01/10/21 1152 03/09/21 1650 03/22/21 1130 03/28/21 0849  NA 140   < > 141 142 138 138 138  K 4.3   < > 3.8 4.4 4.4 4.2 4.1  CL 103   < > 108 107 103 103 102  CO2 22   < > 26 24 26 26 25   GLUCOSE 132*   < > 144* 103* 151* 106* 135*  BUN 11   < > 8 8 14 14 14   CREATININE 0.95   < > 0.95 1.00* 1.25* 1.23* 1.18*  CALCIUM 10.7*   < > 9.1 10.0 10.0 9.7 9.5  GFRNONAA 54*   < > 58* 51* 42* 39* 45*  GFRAA 63  --   --  59*  --  46*  --   PROT  --   --  6.6 6.6  --   --  7.1  ALBUMIN  --   --  3.4*  --   --   --  3.8  AST  --   --  13* 12  --   --  16  ALT  --   --  12 9  --   --  11  ALKPHOS  --   --  51  --   --   --  55  BILITOT  --   --  0.4 0.4  --   --  0.6   < > = values in this interval not displayed.    RADIOGRAPHIC STUDIES: I have personally reviewed the radiological images as listed and agreed with the findings in the report. NM PET (NETSPOT GA 13 DOTATATE) SKULL BASE TO MID THIGH  Result Date: 03/28/2021 CLINICAL DATA:   Restaging malignant carcinoid tumor of the rectum. EXAM: NUCLEAR MEDICINE PET SKULL BASE TO THIGH TECHNIQUE: 4.84 mCi Ga 39 DOTATATE was injected intravenously. Full-ring PET imaging was performed from the skull base to thigh after the radiotracer. CT data was obtained and used for attenuation correction and anatomic localization. COMPARISON:  CT AP 09/11/2020 and PET-CT from 04/29/2019. FINDINGS: NECK No radiotracer activity in neck lymph nodes. Incidental CT findings: None CHEST No radiotracer accumulation within mediastinal or hilar lymph nodes. No suspicious pulmonary nodules on the CT scan. Incidental CT finding:Stable tiny nodule within the periphery of the right upper lobe measuring 3 mm. Too small to characterize by PET-CT. Mild aortic atherosclerosis.  Lad coronary  artery calcifications. ABDOMEN/PELVIS Soft tissue nodule within the right lower quadrant mesentery measures 3.5 cm and has an SUV max of 44.1, image 184/3.On the previous PET-CT this measured 3.4 cm with SUV max of 43. Adjacent nodule measures 2.6 cm with SUV max 42.0, image 182/3. Previously this measured 2.6 cm with SUV max of 44. New right lower quadrant mesenteric soft tissue nodule measures 0.9 cm and has an SUV max of 7.6, image 191/3. Adjacent focus of increased uptake within a small bowel loop has an SUV max 14.7 and measures approximately 2.1 mm, image 196/3. A third focus of increased tracer uptake localizing to a loop of small bowel in the lower abdomen has an SUV max of 11.6 in measures approximately 1.2 cm, image 208/3. Physiologic activity noted in the liver, spleen, adrenal glands and kidneys. Incidental CT findings:Aortic atherosclerosis. No aneurysm. Small hiatal hernia. SKELETON No focal activity to suggest skeletal metastasis. Incidental CT findings:None IMPRESSION: 1. Previously noted FDG avid soft tissue nodules within the right lower quadrant mesentery are not significantly changed in size or degree of tracer uptake. 2. New  small tracer avid soft tissue nodule within the right lower quadrant mesentery, compatible with new site of disease. 3. There are 2 foci of tracer uptake within the lower abdomen which localized to small bowel loops. Cannot exclude new sites of small bowel involvement. Consider more definitive characterization with contrast enhanced CT enterography to assess for any intraluminal filling defects in this area which may reflect tracer avid disease. Electronically Signed   By: Kerby Moors M.D.   On: 03/28/2021 14:21    ASSESSMENT & PLAN:   Carcinoid tumor, malignant ([place]) # Carcinoid-question small bowel primary metastatic abdominal/pelvic mesentery.  May 2021 PET scan shows stable right lower quadrant mesenteric lesion; however new uptake noted in the right lower quadrant recently; and also possible 2 foci localizing small bowel loop.  #Discussed regarding the use of Sandostatin 20 mg IM monthly basis to help slow down the growth of disease/also help with diarrhea.  I discussed the potential side effects including but not limited to abdominal bloating; dyspepsia and gallbladder sludge.  Patient will discuss further with Dr. Janese Banks.   # Chronic diarrhea- [> 1 year]- stool incontinence 4-5 times last 1 months-question related to carcinoid disease.  Check urine 24-hour HIAA collection.  #B12 deficiency-May 2022-120; continue IM B12; also p.o. B12  #Iron deficient anemia hemoglobin 12.5; hold off any iron infusion at this time.   # DISPOSITION: # 24 hour urine collection- jug today # follow up in 3 weeks- Dr.Rao; possible Venofer; B12 injection; possible sandostatin-Dr.B    All questions were answered. The patient knows to call the clinic with any problems, questions or concerns.       Cammie Sickle, MD 04/04/2021 12:55 PM

## 2021-04-07 ENCOUNTER — Other Ambulatory Visit: Payer: Self-pay | Admitting: Family Medicine

## 2021-04-07 DIAGNOSIS — R600 Localized edema: Secondary | ICD-10-CM

## 2021-04-12 ENCOUNTER — Ambulatory Visit: Payer: Medicare HMO | Admitting: Internal Medicine

## 2021-04-17 ENCOUNTER — Telehealth: Payer: Self-pay | Admitting: Family Medicine

## 2021-04-17 ENCOUNTER — Other Ambulatory Visit: Payer: Self-pay

## 2021-04-17 DIAGNOSIS — E559 Vitamin D deficiency, unspecified: Secondary | ICD-10-CM

## 2021-04-17 MED ORDER — VITAMIN D (ERGOCALCIFEROL) 1.25 MG (50000 UNIT) PO CAPS: ORAL_CAPSULE | ORAL | 1 refills | Status: DC

## 2021-04-17 NOTE — Telephone Encounter (Signed)
Requested medication (s) are due for refill today: yes   Requested medication (s) are on the active medication list :yes  Last refill: 01/11/2021  Future visit scheduled: Yes   Notes to clinic:  50,000 IU strengths are not delegated  Requested Prescriptions  Pending Prescriptions Disp Refills   Vitamin D, Ergocalciferol, (DRISDOL) 1.25 MG (50000 UNIT) CAPS capsule [Pharmacy Med Name: VITAMIN D2 1.25MG (50,000 UNIT)] 12 capsule 1    Sig: TAKE 1 CAPSULE BY MOUTH ONE TIME PER WEEK      Endocrinology:  Vitamins - Vitamin D Supplementation Failed - 04/17/2021  9:09 AM      Failed - 50,000 IU strengths are not delegated      Failed - Phosphate in normal range and within 360 days    No results found for: PHOS        Failed - Vitamin D in normal range and within 360 days    Vit D, 25-Hydroxy  Date Value Ref Range Status  12/31/2017 43 30 - 100 ng/mL Final    Comment:    Vitamin D Status         25-OH Vitamin D: . Deficiency:                    <20 ng/mL Insufficiency:             20 - 29 ng/mL Optimal:                 > or = 30 ng/mL . For 25-OH Vitamin D testing on patients on  D2-supplementation and patients for whom quantitation  of D2 and D3 fractions is required, the QuestAssureD(TM) 25-OH VIT D, (D2,D3), LC/MS/MS is recommended: order  code [phone] (patients >48yrs). . For more information on this test, go to: http://education.questdiagnostics.com/faq/FAQ163 (This link is being provided for  informational/educational purposes only.)           Passed - Ca in normal range and within 360 days    Calcium  Date Value Ref Range Status  03/28/2021 9.5 8.9 - 10.3 mg/dL Final   Calcium, Total  Date Value Ref Range Status  03/28/2014 8.9 8.5 - 10.1 mg/dL Final          Passed - Valid encounter within last 12 months    Recent Outpatient Visits           3 weeks ago [facility] Steele Sizer, MD   3 months ago Stage 3a chronic kidney  disease [facility])   [facility] Steele Sizer, MD   6 months ago [facility] Steele Sizer, MD   6 months ago Pruritic intertrigo   [facility] Delsa Grana, PA-C   7 months ago Dyslipidemia associated with type 2 diabetes mellitus [facility])   [facility] Steele Sizer, MD       Future Appointments             In 2 days Wellington Hampshire, MD [facility], [place]   In 3 weeks Steele Sizer, MD [facility], [facility]

## 2021-04-17 NOTE — Telephone Encounter (Signed)
Pt has an appt on 05/14/21

## 2021-04-19 ENCOUNTER — Ambulatory Visit (INDEPENDENT_AMBULATORY_CARE_PROVIDER_SITE_OTHER): Payer: Medicare HMO | Admitting: Cardiovascular Disease

## 2021-04-19 ENCOUNTER — Other Ambulatory Visit: Payer: Self-pay

## 2021-04-19 ENCOUNTER — Encounter: Payer: Self-pay | Admitting: Cardiovascular Disease

## 2021-04-19 VITALS — BP 160/68 | HR 71 | Ht 64.5 in | Wt 193.0 lb

## 2021-04-19 DIAGNOSIS — I480 Paroxysmal atrial fibrillation: Secondary | ICD-10-CM

## 2021-04-19 DIAGNOSIS — N1831 Chronic kidney disease, stage 3a: Secondary | ICD-10-CM | POA: Diagnosis not present

## 2021-04-19 DIAGNOSIS — R011 Cardiac murmur, unspecified: Secondary | ICD-10-CM | POA: Diagnosis not present

## 2021-04-19 DIAGNOSIS — I10 Essential (primary) hypertension: Secondary | ICD-10-CM | POA: Diagnosis not present

## 2021-04-19 DIAGNOSIS — E785 Hyperlipidemia, unspecified: Secondary | ICD-10-CM | POA: Diagnosis not present

## 2021-04-19 DIAGNOSIS — R002 Palpitations: Secondary | ICD-10-CM

## 2021-04-19 NOTE — Patient Instructions (Signed)
Medication Instructions:   Your physician recommends that you continue on your current medications as directed. Please refer to the Current Medication list given to you today.  *If you need a refill on your cardiac medications before your next appointment, please call your pharmacy*   Lab Work:  None ordered  If you have labs (blood work) drawn today and your tests are completely normal, you will receive your results only by: [place] (if you have MyChart) OR A paper copy in the mail If you have any lab test that is abnormal or we need to change your treatment, we will call you to review the results.   Testing/Procedures:  Echocardiogram Please return to [facility] on ______________ at _______________ AM/PM for an Echocardiogram. Your physician has requested that you have an echocardiogram. Echocardiography is a painless test that uses sound waves to create images of your heart. It provides your doctor with information about the size and shape of your heart and how well your heart's chambers and valves are working. This procedure takes approximately one hour. There are no restrictions for this procedure. Please note; depending on visual quality an IV may need to be placed.     Follow-Up: At [facility], you and your health needs are our priority.  As part of our continuing mission to provide you with exceptional heart care, we have created designated Provider Care Teams.  These Care Teams include your primary Cardiologist (physician) and Advanced Practice Providers (APPs -  Physician Assistants and Nurse Practitioners) who all work together to provide you with the care you need, when you need it.  We recommend signing up for the patient portal called "MyChart".  Sign up information is provided on this After Visit Summary.  MyChart is used to connect with patients for Virtual Visits (Telemedicine).  Patients are able to view lab/test results, encounter notes,  upcoming appointments, etc.  Non-urgent messages can be sent to your provider as well.   To learn more about what you can do with MyChart, go to [email or web address].    Your next appointment:   6 month(s)  The format for your next appointment:   In Person  Provider:   You may see Kathlyn Sacramento, MD or one of the following Advanced Practice Providers on your designated Care Team:   Murray Hodgkins, NP Christell Faith, PA-C Marrianne Mood, PA-C Cadence Holgate, [place] Laurann [place], NP

## 2021-04-19 NOTE — Progress Notes (Signed)
Cardiology Office Note   Date:  04/19/2021   ID:  [name], DOB September 07, 1932, MRN [id number]  PCP:  Steele Sizer, MD  Cardiologist:  Kathlyn Sacramento, MD   Chief Complaint  Patient presents with   Other    6 month f/u c/o edema ankles/feet and burning sensation worse in left foot. Meds reviewed verbally with pt.        History of Present Illness: Susan Bowen is a 87 y.o. female who presents for a follow-up visit regarding paroxysmal atrial fibrillation. She has multiple chronic medical conditions that include hypertension, diabetes and hyperlipidemia.   She is not a smoker and has no family history of premature coronary artery disease. She was hospitalized in May, 2019 with chest pain in the setting of A. fib with RVR.  The dose of carvedilol was increased to 12.5 mg twice daily and she was started on Eliquis for anticoagulation.  Echocardiogram showed normal LV systolic function with grade 1 diastolic dysfunction. Lexiscan Myoview in June 2019 showed no evidence of ischemia with normal ejection fraction.  In 2020, she was diagnosed with carcinoid tumor with metastasis.  This was an incidental finding on CT of the abdomen which was done for renal stone .  This has been monitored by oncology without active treatment.  She went to the emergency room last month for dizziness and presyncopal episode.  Work-up was overall nonrevealing and she was given IV fluids and discharged.  She feels better now with no recurrent symptoms.  She has been doing well with no recent chest pain or worsening dyspnea.  Blood pressure is elevated today but it has been mostly normal in the 2 most recent office visit.  She does complain of increased edema especially on the left side after increasing amlodipine to 10 mg daily.  Past Medical History:  Diagnosis Date   Allergy    Back spasm    Bunion    Carpal tunnel syndrome    Cataracta    Diabetes mellitus without complication (HCC)     Generalized osteoarthritis    GERD (gastroesophageal reflux disease)    Gout    Hemorrhoids without complication    Hyperlipidemia    Hypertension    Hypothyroidism    Impingement syndrome of right shoulder    Insomnia    Lipoma    Lung nodule    Metastatic carcinoid tumor ([place]) 03/2016   Neuritis or radiculitis due to rupture of lumbar intervertebral disc    Obesity    Osteoporosis    PAF (paroxysmal atrial fibrillation) ([place])    a. 03/2018 AF w/ RVR-->converted spont.  CHA2DS2VASc = 7-->Eliquis.   Proteinuria    Rotator cuff tear    Systolic murmur    a. 05/785 Echo: EF 60-65%, no rwma, Gr1 DD, mild MR, mildly dil LA. Nl RV fxn. PASP 12mHg.   TIA (transient ischemic attack)    TIA (transient ischemic attack) 1998   small TIA.  no residual effects   Tinnitus of both ears    Unspecified glaucoma(365.9)    Vitamin D deficiency    Wedge compression fracture of t11-T12 vertebra, sequela     Past Surgical History:  Procedure Laterality Date   ABDOMINAL HYSTERECTOMY  1986   APPENDECTOMY     BREAST SURGERY Bilateral 1983   biopsy of each side, negative   CARPAL TUNNEL RELEASE     CATARACT EXTRACTION W/PHACO Left 09/03/2017   Procedure: CATARACT EXTRACTION PHACO AND INTRAOCULAR LENS PLACEMENT (IOC)-LEFT  DIABETIC;  Surgeon: Birder Robson, MD;  Location: [facility] ORS;  Service: Ophthalmology;  Laterality: Left;  [place] 01:10.2 AP% 16.7 CDE 11.71 Fluid Pack Lot # [id number]   CATARACT EXTRACTION W/PHACO Right 09/30/2017   Procedure: CATARACT EXTRACTION PHACO AND INTRAOCULAR LENS PLACEMENT (IOC);  Surgeon: Birder Robson, MD;  Location: [facility] ORS;  Service: Ophthalmology;  Laterality: Right;  [place] 01:35.1 AP% 13.1 CDE 12.44 Fluid pack Lot # [id number]   COLON SURGERY     EYE SURGERY Left 09/03/2017   Cataract extraction   FEMUR FRACTURE SURGERY Left    FRACTURE SURGERY  2001   left femur   HEMORROIDECTOMY     polyp removed from vocal cord       Current Outpatient Medications   Medication Sig Dispense Refill   acetaminophen-codeine (TYLENOL #3) 300-30 MG tablet TAKE 1 TABLET BY MOUTH EVERY 6 HOURS AS NEEDED FOR CHRONIC PAIN 30 tablet 0   allopurinol (ZYLOPRIM) 100 MG tablet TAKE 1 TABLET BY MOUTH EVERY DAY 90 tablet 1   amLODipine (NORVASC) 10 MG tablet Take 1 tablet (10 mg total) by mouth daily. 90 tablet 3   Blood Glucose Monitoring Suppl (ACCU-CHEK AVIVA PLUS) w/Device KIT 1 kit by Does not apply route 3 (three) times daily. 1 kit 0   carvedilol (COREG) 25 MG tablet Take 1 tablet (25 mg total) by mouth 2 (two) times daily. New dose 180 tablet 1   COMBIGAN 0.2-0.5 % ophthalmic solution Place 1 drop into both eyes 2 (two) times daily.     Dulaglutide (TRULICITY) 3 JY/7.8GN SOPN Inject 3 mg as directed once a week. 12 mL 1   ELIQUIS 5 MG TABS tablet TAKE 1 TABLET BY MOUTH TWICE A DAY 180 tablet 1   furosemide (LASIX) 20 MG tablet Take 1 tablet (20 mg total) by mouth daily. 30 tablet 3   glucose blood (ACCU-CHEK AVIVA PLUS) test strip Check fasting glucose once each morning, and check glucose as needed. 100 each 12   hydrOXYzine (ATARAX/VISTARIL) 10 MG tablet TAKE 1 TABLET BY MOUTH EVERYDAY AT BEDTIME 90 tablet 0   JARDIANCE 25 MG TABS tablet TAKE 25 MG BY MOUTH DAILY. 90 tablet 1   Lancets (ACCU-CHEK MULTICLIX) lancets Use as instructed 204 each 12   levothyroxine (SYNTHROID) 50 MCG tablet Take 1 tablet (50 mcg total) by mouth daily before breakfast. And take two tablets twice a week 114 tablet 1   loratadine (CLARITIN) 10 MG tablet Take 1 tablet (10 mg total) by mouth daily. 90 tablet 1   metFORMIN (GLUCOPHAGE) 850 MG tablet TAKE 1 TABLET (850 MG TOTAL) BY MOUTH 2 (TWO) TIMES DAILY. 180 tablet 1   MITIGARE 0.6 MG CAPS TAKE 1-2 CAPSULES (0.6-1.2 MG TOTAL) BY MOUTH DAILY AS NEEDED. FOR GOUT, NOT DAILY 30 capsule 0   nystatin (MYCOSTATIN/NYSTOP) powder Apply 1 application topically 2 (two) times daily as needed. 30 g 0   olmesartan (BENICAR) 40 MG tablet Take 1 tablet  (40 mg total) by mouth daily. 90 tablet 1   potassium chloride SA (KLOR-CON) 20 MEQ tablet Take 1 tablet (20 mEq total) by mouth daily. 30 tablet 3   pravastatin (PRAVACHOL) 40 MG tablet TAKE 1 TABLET BY MOUTH EVERY DAY 90 tablet 2   tacrolimus (PROTOPIC) 0.1 % ointment Apply topically as directed. Bid to rash under breast and in groin for 2 weeks, then decrease to qd until clear, then prn flares 100 g 2   Travoprost, BAK Free, (TRAVATAN) 0.004 % SOLN ophthalmic solution Place  1 drop into both eyes at bedtime.     Vitamin D, Ergocalciferol, (DRISDOL) 1.25 MG (50000 UNIT) CAPS capsule TAKE 1 CAPSULE BY MOUTH WEEKLY 12 capsule 1   No current facility-administered medications for this visit.   Facility-Administered Medications Ordered in Other Visits  Medication Dose Route Frequency Provider Last Rate Last Admin   cyanocobalamin ((VITAMIN B-12)) injection 1,000 mcg  1,000 mcg Intramuscular Q30 days Sindy Guadeloupe, MD   1,000 mcg at 12/20/20 1414   cyanocobalamin ((VITAMIN B-12)) injection 1,000 mcg  1,000 mcg Intramuscular Q30 days Sindy Guadeloupe, MD   1,000 mcg at 03/05/21 1341    Allergies:   Augmentin [amoxicillin-pot clavulanate]    Social History:  The patient  reports that she has never smoked. She has never used smokeless tobacco. She reports that she does not drink alcohol and does not use drugs.   Family History:  The patient's family history includes Dementia in her mother; Diabetes in her mother; Hypertension in her mother and son.    ROS:  Please see the history of present illness.   Otherwise, review of systems are positive for none.   All other systems are reviewed and negative.    PHYSICAL EXAM: VS:  BP (!) 160/68 (BP Location: Left Arm, Patient Position: Sitting, Cuff Size: Normal)   Pulse 71   Ht 5' 4.5" (1.638 m)   Wt 193 lb (87.5 kg)   LMP  (LMP Unknown)   SpO2 96%   BMI 32.62 kg/m  , BMI Body mass index is 32.62 kg/m. GEN: Well nourished, well developed, in no  acute distress  HEENT: normal  Neck: no JVD, carotid bruits, or masses Cardiac: RRR; no  rubs, or gallops.  Mild bilateral leg edema worse on the left side. 2 / 6 systolic murmur in the aortic area Respiratory:  clear to auscultation bilaterally, normal work of breathing GI: soft, nontender, nondistended, + BS MS: no deformity or atrophy  Skin: warm and dry, no rash Neuro:  Strength and sensation are intact Psych: euthymic mood, full affect   EKG:  EKG is ordered today. The ekg ordered today demonstrates normal sinus rhythm with sinus arrhythmia  Recent Labs: 03/22/2021: TSH 9.07 03/28/2021: ALT 11; BUN 14; Creatinine, Ser 1.18; Hemoglobin 12.8; Platelets 280; Potassium 4.1; Sodium 138    Lipid Panel    Component Value Date/Time   CHOL 130 01/10/2021 1152   CHOL 146 12/19/2016 0951   TRIG 129 01/10/2021 1152   HDL 54 01/10/2021 1152   HDL 50 12/19/2016 0951   CHOLHDL 2.4 01/10/2021 1152   LDLCALC 55 01/10/2021 1152      Wt Readings from Last 3 Encounters:  04/19/21 193 lb (87.5 kg)  04/04/21 195 lb (88.5 kg)  03/22/21 194 lb (88 kg)      No flowsheet data found.    ASSESSMENT AND PLAN:  1.  Paroxysmal atrial fibrillation: She reports palpitations usually do not last for more than a minute.  I reassured her.  Continue current dose of carvedilol 25 mg twice daily.  I reviewed his recent labs which showed stable renal function and thus we will continue current dose of Eliquis 5 mg twice daily.    2. Essential hypertension: Blood pressure is elevated today but was controlled on Recent office visit.  Continue to monitor for now.  Continue amlodipine 10 mg daily, carvedilol 25 mg twice daily, furosemide 20 mg daily and olmesartan 40 mg daily.  3. Hyperlipidemia: I reviewed most recent lipid  profile showed an LDL of 55.  I recommend continue with pravastatin 40 mg daily.  4.  Bilateral leg edema: Suspect there is a component of chronic venous insufficiency and also worsened  by recent increase of amlodipine to 10 mg daily.  I advised her to try to elevate her legs during the day and if symptoms worsen, consider knee-high support stockings.  Doubt DVT given that she is on anticoagulation  5.  Cardiac murmur: Seems to be more prominent than before and suggestive of mild aortic stenosis.  I requested an echocardiogram.   Disposition:   FU with me in 6 months   Signed, Kathlyn Sacramento, MD 04/19/21

## 2021-04-25 NOTE — Telephone Encounter (Signed)
error 

## 2021-04-26 DIAGNOSIS — E785 Hyperlipidemia, unspecified: Secondary | ICD-10-CM | POA: Diagnosis not present

## 2021-04-26 DIAGNOSIS — E119 Type 2 diabetes mellitus without complications: Secondary | ICD-10-CM | POA: Diagnosis not present

## 2021-04-26 DIAGNOSIS — R197 Diarrhea, unspecified: Secondary | ICD-10-CM | POA: Diagnosis not present

## 2021-04-26 DIAGNOSIS — C7A1 Malignant poorly differentiated neuroendocrine tumors: Secondary | ICD-10-CM | POA: Diagnosis not present

## 2021-04-26 DIAGNOSIS — D509 Iron deficiency anemia, unspecified: Secondary | ICD-10-CM | POA: Diagnosis not present

## 2021-04-26 DIAGNOSIS — K219 Gastro-esophageal reflux disease without esophagitis: Secondary | ICD-10-CM | POA: Diagnosis not present

## 2021-04-26 DIAGNOSIS — M1990 Unspecified osteoarthritis, unspecified site: Secondary | ICD-10-CM | POA: Diagnosis not present

## 2021-04-26 DIAGNOSIS — Z79899 Other long term (current) drug therapy: Secondary | ICD-10-CM | POA: Diagnosis not present

## 2021-04-26 DIAGNOSIS — E538 Deficiency of other specified B group vitamins: Secondary | ICD-10-CM | POA: Diagnosis not present

## 2021-04-27 ENCOUNTER — Inpatient Hospital Stay: Payer: Medicare HMO

## 2021-04-27 ENCOUNTER — Encounter: Payer: Self-pay | Admitting: Oncology

## 2021-04-27 ENCOUNTER — Inpatient Hospital Stay (HOSPITAL_BASED_OUTPATIENT_CLINIC_OR_DEPARTMENT_OTHER): Payer: Medicare HMO | Admitting: Oncology

## 2021-04-27 VITALS — BP 130/66 | HR 63 | Temp 97.2°F | Resp 18 | Wt 193.0 lb

## 2021-04-27 DIAGNOSIS — R197 Diarrhea, unspecified: Secondary | ICD-10-CM | POA: Diagnosis not present

## 2021-04-27 DIAGNOSIS — C7A019 Malignant carcinoid tumor of the small intestine, unspecified portion: Secondary | ICD-10-CM

## 2021-04-27 DIAGNOSIS — C7B00 Secondary carcinoid tumors, unspecified site: Secondary | ICD-10-CM

## 2021-04-27 DIAGNOSIS — D509 Iron deficiency anemia, unspecified: Secondary | ICD-10-CM | POA: Diagnosis not present

## 2021-04-27 DIAGNOSIS — K219 Gastro-esophageal reflux disease without esophagitis: Secondary | ICD-10-CM | POA: Diagnosis not present

## 2021-04-27 DIAGNOSIS — E538 Deficiency of other specified B group vitamins: Secondary | ICD-10-CM

## 2021-04-27 DIAGNOSIS — M1990 Unspecified osteoarthritis, unspecified site: Secondary | ICD-10-CM | POA: Diagnosis not present

## 2021-04-27 DIAGNOSIS — E119 Type 2 diabetes mellitus without complications: Secondary | ICD-10-CM | POA: Diagnosis not present

## 2021-04-27 DIAGNOSIS — D508 Other iron deficiency anemias: Secondary | ICD-10-CM

## 2021-04-27 DIAGNOSIS — E785 Hyperlipidemia, unspecified: Secondary | ICD-10-CM | POA: Diagnosis not present

## 2021-04-27 DIAGNOSIS — C7A1 Malignant poorly differentiated neuroendocrine tumors: Secondary | ICD-10-CM | POA: Diagnosis not present

## 2021-04-27 DIAGNOSIS — Z79899 Other long term (current) drug therapy: Secondary | ICD-10-CM | POA: Diagnosis not present

## 2021-04-27 LAB — CBC WITH DIFFERENTIAL/PLATELET
Abs Immature Granulocytes: 0.03 10*3/uL (ref 0.00–0.07)
Basophils Absolute: 0.1 10*3/uL (ref 0.0–0.1)
Basophils Relative: 1 %
Eosinophils Absolute: 0.1 10*3/uL (ref 0.0–0.5)
Eosinophils Relative: 1 %
HCT: 41.6 % (ref 36.0–46.0)
Hemoglobin: 13.2 g/dL (ref 12.0–15.0)
Immature Granulocytes: 0 %
Lymphocytes Relative: 31 %
Lymphs Abs: 3 10*3/uL (ref 0.7–4.0)
MCH: 28 pg (ref 26.0–34.0)
MCHC: 31.7 g/dL (ref 30.0–36.0)
MCV: 88.3 fL (ref 80.0–100.0)
Monocytes Absolute: 0.7 10*3/uL (ref 0.1–1.0)
Monocytes Relative: 7 %
Neutro Abs: 5.6 10*3/uL (ref 1.7–7.7)
Neutrophils Relative %: 60 %
Platelets: 260 10*3/uL (ref 150–400)
RBC: 4.71 MIL/uL (ref 3.87–5.11)
RDW: 15.3 % (ref 11.5–15.5)
WBC: 9.5 10*3/uL (ref 4.0–10.5)
nRBC: 0 % (ref 0.0–0.2)

## 2021-04-27 LAB — IRON AND TIBC
Iron: 44 ug/dL (ref 28–170)
Saturation Ratios: 12 % (ref 10.4–31.8)
TIBC: 365 ug/dL (ref 250–450)
UIBC: 321 ug/dL

## 2021-04-27 LAB — VITAMIN B12: Vitamin B-12: 171 pg/mL — ABNORMAL LOW (ref 180–914)

## 2021-04-27 LAB — FERRITIN: Ferritin: 48 ng/mL (ref 11–307)

## 2021-04-27 MED ORDER — CYANOCOBALAMIN 1000 MCG/ML IJ SOLN
1000.0000 ug | INTRAMUSCULAR | Status: DC
  Administered 2021-04-27: 1000 ug via INTRAMUSCULAR
  Filled 2021-04-27: qty 1

## 2021-04-27 NOTE — Patient Instructions (Signed)
Vitamin B12 Injection What is this medication? Vitamin B12 (VAHY tuh min B12) prevents and treats low vitamin B12 levels in your body. It is used in people who do not get enough vitamin B12 from their diet or when their digestive tract does not absorb enough. Vitamin B12 plays an important role in maintaining the health of your nervous system and red bloodcells. This medicine may be used for other purposes; ask your health care provider orpharmacist if you have questions. COMMON BRAND NAME(S): B-12 Compliance Kit, B-12 Injection Kit, Cyomin, LA-12,Nutri-Twelve, Physicians EZ Use B-12, Primabalt What should I tell my care team before I take this medication? They need to know if you have any of these conditions: Kidney disease Leber's disease Megaloblastic anemia An unusual or allergic reaction to cyanocobalamin, cobalt, other medications, foods, dyes, or preservatives Pregnant or trying to get pregnant Breast-feeding How should I use this medication? This medication is injected into a muscle or deeply under the skin. It is usually given in a clinic or care team's office. However, your care team mayteach you how to inject yourself. Follow all instructions. Talk to your care team about the use of this medication in children. Specialcare may be needed. Overdosage: If you think you have taken too much of this medicine contact apoison control center or emergency room at once. NOTE: This medicine is only for you. Do not share this medicine with others. What if I miss a dose? If you are given your dose at a clinic or care team's office, call to reschedule your appointment. If you give your own injections, and you miss a dose, take it as soon as you can. If it is almost time for your next dose, takeonly that dose. Do not take double or extra doses. What may interact with this medication? Colchicine Heavy alcohol intake This list may not describe all possible interactions. Give your health care provider  a list of all the medicines, herbs, non-prescription drugs, or dietary supplements you use. Also tell them if you smoke, drink alcohol, or use illegaldrugs. Some items may interact with your medicine. What should I watch for while using this medication? Visit your care team regularly. You may need blood work done while you aretaking this medication. You may need to follow a special diet. Talk to your care team. Limit youralcohol intake and avoid smoking to get the best benefit. What side effects may I notice from receiving this medication? Side effects that you should report to your care team as soon as possible: Allergic reactions-skin rash, itching, hives, swelling of the face, lips, tongue, or throat Swelling of the ankles, hands, or feet Trouble breathing Side effects that usually do not require medical attention (report to your careteam if they continue or are bothersome): Diarrhea This list may not describe all possible side effects. Call your doctor for medical advice about side effects. You may report side effects to FDA at1-800-FDA-1088. Where should I keep my medication? Keep out of the reach of children. Store at room temperature between 15 and 30 degrees C (59 and 85 degrees F).Protect from light. Throw away any unused medication after the expiration date. NOTE: This sheet is a summary. It may not cover all possible information. If you have questions about this medicine, talk to your doctor, pharmacist, orhealth care provider.  2022 Elsevier/Gold Standard (2020-12-11 11:47:06)  

## 2021-04-27 NOTE — Progress Notes (Signed)
Patient here for oncology follow-up appointment, expresses concerns of side effects of sandostatin, lightheadness, & numbness in feet

## 2021-04-28 NOTE — Progress Notes (Signed)
Hematology/Oncology Consult note [facility]  [phone] [phone]  Patient Care Team: Steele Sizer, MD as PCP - General (Family Medicine) Wellington Hampshire, MD as PCP - Cardiology (Cardiology) Sindy Guadeloupe, MD as Consulting Physician (Oncology)   Name of the patient: Susan Bowen  [id number]  1932-01-18   Date of visit: 04/28/21  Diagnosis-  1. intra abdominal adenopathy- metastatic carcinoid on surveillance 2.  Iron and B12 deficiency anemia  Chief complaint/ Reason for visit-discuss further management of carcinoid  Heme/Onc history:  Patient is a 87 year old female who underwent CT renal stone study for flank pain.  CT scan showed pathologically enlarged lymph nodes in the right lower quadrant measuring up to 2.6 cm.  Findings concerning for lymphoma or malignant process.  Patient's past medical history significant for pulmonary hypertension, CKD, hypothyroidism among other medical problems.  She did not have any B symptoms.  This was followed by a PET CT scan Which showed 3 prominent soft tissue nodule/lymph nodes one in the mesentery at 2.1 cm with an SUV of 2.8.  Adjacent soft tissue nodule/lymph node in the right iliac fossa measuring 3.1 cm and SUV of 2.8.  Predominantly fat attenuation mesenteric nodule in the left lower quadrant measuring 2 cm without any significant FDG uptake   She remains on surveillance and has not started any somatostatin therapy yet   Interval history-patient states that her diarrhea is presently much improved after making changes in her diet.  She now focuses on cooking her own food and eating healthy meals and that has helped her diarrhea a lot.  Denies any flushing or palpitations  ECOG PS- 1 Pain scale- 0   Review of systems- Review of Systems  Constitutional:  Negative for chills, fever, malaise/fatigue and weight loss.  HENT:  Negative for congestion, ear discharge and nosebleeds.   Eyes:   Negative for blurred vision.  Respiratory:  Negative for cough, hemoptysis, sputum production, shortness of breath and wheezing.   Cardiovascular:  Negative for chest pain, palpitations, orthopnea and claudication.  Gastrointestinal:  Negative for abdominal pain, blood in stool, constipation, diarrhea, heartburn, melena, nausea and vomiting.  Genitourinary:  Negative for dysuria, flank pain, frequency, hematuria and urgency.  Musculoskeletal:  Negative for back pain, joint pain and myalgias.  Skin:  Negative for rash.  Neurological:  Negative for dizziness, tingling, focal weakness, seizures, weakness and headaches.  Endo/Heme/Allergies:  Does not bruise/bleed easily.  Psychiatric/Behavioral:  Negative for depression and suicidal ideas. The patient does not have insomnia.      Allergies  Allergen Reactions   Augmentin [Amoxicillin-Pot Clavulanate] Itching    Has patient had a PCN reaction causing immediate rash, facial/tongue/throat swelling, SOB or lightheadedness with hypotension: No Has patient had a PCN reaction causing severe rash involving mucus membranes or skin necrosis: No Has patient had a PCN reaction that required hospitalization: No Has patient had a PCN reaction occurring within the last 10 years: No If all of the above answers are "NO", then may proceed with Cephalosporin use.      Past Medical History:  Diagnosis Date   Allergy    Back spasm    Bunion    Carpal tunnel syndrome    Cataracta    Diabetes mellitus without complication (HCC)    Generalized osteoarthritis    GERD (gastroesophageal reflux disease)    Gout    Hemorrhoids without complication    Hyperlipidemia    Hypertension    Hypothyroidism  Impingement syndrome of right shoulder    Insomnia    Lipoma    Lung nodule    Metastatic carcinoid tumor ([place]) 03/2016   Neuritis or radiculitis due to rupture of lumbar intervertebral disc    Obesity    Osteoporosis    PAF (paroxysmal atrial  fibrillation) ([place])    a. 03/2018 AF w/ RVR-->converted spont.  CHA2DS2VASc = 7-->Eliquis.   Proteinuria    Rotator cuff tear    Systolic murmur    a. 01/5464 Echo: EF 60-65%, no rwma, Gr1 DD, mild MR, mildly dil LA. Nl RV fxn. PASP 47mHg.   TIA (transient ischemic attack)    TIA (transient ischemic attack) 1998   small TIA.  no residual effects   Tinnitus of both ears    Unspecified glaucoma(365.9)    Vitamin D deficiency    Wedge compression fracture of t11-T12 vertebra, sequela      Past Surgical History:  Procedure Laterality Date   ABDOMINAL HYSTERECTOMY  1986   APPENDECTOMY     BREAST SURGERY Bilateral 1983   biopsy of each side, negative   CARPAL TUNNEL RELEASE     CATARACT EXTRACTION W/PHACO Left 09/03/2017   Procedure: CATARACT EXTRACTION PHACO AND INTRAOCULAR LENS PLACEMENT (IOC)-LEFT DIABETIC;  Surgeon: PBirder Robson MD;  Location: [facility] ORS;  Service: Ophthalmology;  Laterality: Left;  [place]:10.2 AP% 16.7 CDE 11.71 Fluid Pack Lot # [id number]  CATARACT EXTRACTION W/PHACO Right 09/30/2017   Procedure: CATARACT EXTRACTION PHACO AND INTRAOCULAR LENS PLACEMENT (IOC);  Surgeon: PBirder Robson MD;  Location: [facility] ORS;  Service: Ophthalmology;  Laterality: Right;  [place]:35.1 AP% 13.1 CDE 12.44 Fluid pack Lot # [id number]   COLON SURGERY     EYE SURGERY Left 09/03/2017   Cataract extraction   FEMUR FRACTURE SURGERY Left    FRACTURE SURGERY  2001   left femur   HEMORROIDECTOMY     polyp removed from vocal cord      Social History   Socioeconomic History   Marital status: Divorced    Spouse name: Not on file   Number of children: 6   Years of education: Not on file   Highest education level: 10th grade  Occupational History   Not on file  Tobacco Use   Smoking status: Never   Smokeless tobacco: Never  Vaping Use   Vaping Use: Never used  Substance and Sexual Activity   Alcohol use: No    Alcohol/week: 0.0 standard drinks   Drug use: No   Sexual  activity: Not Currently  Other Topics Concern   Not on file  Social History Narrative   Pt lives with her son; independent ADL's, no longer drives   Social Determinants of Health   Financial Resource Strain: Low Risk    Difficulty of Paying Living Expenses: Not very hard  Food Insecurity: No Food Insecurity   Worried About RCharity fundraiserin the Last Year: Never true   Ran Out of Food in the Last Year: Never true  Transportation Needs: No Transportation Needs   Lack of Transportation (Medical): No   Lack of Transportation (Non-Medical): No  Physical Activity: Insufficiently Active   Days of Exercise per Week: 7 days   Minutes of Exercise per Session: 10 min  Stress: No Stress Concern Present   Feeling of Stress : Not at all  Social Connections: Moderately Integrated   Frequency of Communication with Friends and Family: More than three times a week   Frequency of Social  Gatherings with Friends and Family: Three times a week   Attends Religious Services: More than 4 times per year   Active Member of Clubs or Organizations: Yes   Attends Archivist Meetings: 1 to 4 times per year   Marital Status: Divorced  Human resources officer Violence: Not At Risk   Fear of Current or Ex-Partner: No   Emotionally Abused: No   Physically Abused: No   Sexually Abused: No    Family History  Problem Relation Age of Onset   Diabetes Mother    Hypertension Mother    Dementia Mother    Hypertension Son      Current Outpatient Medications:    acetaminophen-codeine (TYLENOL #3) 300-30 MG tablet, TAKE 1 TABLET BY MOUTH EVERY 6 HOURS AS NEEDED FOR CHRONIC PAIN, Disp: 30 tablet, Rfl: 0   allopurinol (ZYLOPRIM) 100 MG tablet, TAKE 1 TABLET BY MOUTH EVERY DAY, Disp: 90 tablet, Rfl: 1   amLODipine (NORVASC) 10 MG tablet, Take 1 tablet (10 mg total) by mouth daily., Disp: 90 tablet, Rfl: 3   Blood Glucose Monitoring Suppl (ACCU-CHEK AVIVA PLUS) w/Device KIT, 1 kit by Does not apply route 3  (three) times daily., Disp: 1 kit, Rfl: 0   carvedilol (COREG) 25 MG tablet, Take 1 tablet (25 mg total) by mouth 2 (two) times daily. New dose, Disp: 180 tablet, Rfl: 1   COMBIGAN 0.2-0.5 % ophthalmic solution, Place 1 drop into both eyes 2 (two) times daily., Disp: , Rfl:    Dulaglutide (TRULICITY) 3 VF/6.4PP SOPN, Inject 3 mg as directed once a week., Disp: 12 mL, Rfl: 1   ELIQUIS 5 MG TABS tablet, TAKE 1 TABLET BY MOUTH TWICE A DAY, Disp: 180 tablet, Rfl: 1   furosemide (LASIX) 20 MG tablet, Take 1 tablet (20 mg total) by mouth daily., Disp: 30 tablet, Rfl: 3   glucose blood (ACCU-CHEK AVIVA PLUS) test strip, Check fasting glucose once each morning, and check glucose as needed., Disp: 100 each, Rfl: 12   hydrOXYzine (ATARAX/VISTARIL) 10 MG tablet, TAKE 1 TABLET BY MOUTH EVERYDAY AT BEDTIME, Disp: 90 tablet, Rfl: 0   JARDIANCE 25 MG TABS tablet, TAKE 25 MG BY MOUTH DAILY., Disp: 90 tablet, Rfl: 1   Lancets (ACCU-CHEK MULTICLIX) lancets, Use as instructed, Disp: 204 each, Rfl: 12   levothyroxine (SYNTHROID) 50 MCG tablet, Take 1 tablet (50 mcg total) by mouth daily before breakfast. And take two tablets twice a week, Disp: 114 tablet, Rfl: 1   loratadine (CLARITIN) 10 MG tablet, Take 1 tablet (10 mg total) by mouth daily., Disp: 90 tablet, Rfl: 1   metFORMIN (GLUCOPHAGE) 850 MG tablet, TAKE 1 TABLET (850 MG TOTAL) BY MOUTH 2 (TWO) TIMES DAILY., Disp: 180 tablet, Rfl: 1   MITIGARE 0.6 MG CAPS, TAKE 1-2 CAPSULES (0.6-1.2 MG TOTAL) BY MOUTH DAILY AS NEEDED. FOR GOUT, NOT DAILY, Disp: 30 capsule, Rfl: 0   nystatin (MYCOSTATIN/NYSTOP) powder, Apply 1 application topically 2 (two) times daily as needed., Disp: 30 g, Rfl: 0   olmesartan (BENICAR) 40 MG tablet, Take 1 tablet (40 mg total) by mouth daily., Disp: 90 tablet, Rfl: 1   potassium chloride SA (KLOR-CON) 20 MEQ tablet, Take 1 tablet (20 mEq total) by mouth daily., Disp: 30 tablet, Rfl: 3   pravastatin (PRAVACHOL) 40 MG tablet, TAKE 1 TABLET BY  MOUTH EVERY DAY, Disp: 90 tablet, Rfl: 2   tacrolimus (PROTOPIC) 0.1 % ointment, Apply topically as directed. Bid to rash under breast and in groin for  2 weeks, then decrease to qd until clear, then prn flares, Disp: 100 g, Rfl: 2   Travoprost, BAK Free, (TRAVATAN) 0.004 % SOLN ophthalmic solution, Place 1 drop into both eyes at bedtime., Disp: , Rfl:    Vitamin D, Ergocalciferol, (DRISDOL) 1.25 MG (50000 UNIT) CAPS capsule, TAKE 1 CAPSULE BY MOUTH WEEKLY, Disp: 12 capsule, Rfl: 1 No current facility-administered medications for this visit.  Facility-Administered Medications Ordered in Other Visits:    cyanocobalamin ((VITAMIN B-12)) injection 1,000 mcg, 1,000 mcg, Intramuscular, Q30 days, Sindy Guadeloupe, MD, 1,000 mcg at 12/20/20 1414   cyanocobalamin ((VITAMIN B-12)) injection 1,000 mcg, 1,000 mcg, Intramuscular, Q30 days, Sindy Guadeloupe, MD, 1,000 mcg at 03/05/21 1341  Physical exam:  Vitals:   04/27/21 1120  BP: 130/66  Pulse: 63  Resp: 18  Temp: (!) 97.2 F (36.2 C)  TempSrc: Tympanic  SpO2: 97%  Weight: 193 lb (87.5 kg)   Physical Exam Cardiovascular:     Rate and Rhythm: Normal rate.     Heart sounds: Normal heart sounds.  Pulmonary:     Effort: Pulmonary effort is normal.  Abdominal:     General: Bowel sounds are normal.  Skin:    General: Skin is warm and dry.  Neurological:     Mental Status: She is alert and oriented to person, place, and time.     CMP Latest Ref Rng & Units 03/28/2021  Glucose 70 - 99 mg/dL 135(H)  BUN 8 - 23 mg/dL 14  Creatinine 0.44 - 1.00 mg/dL 1.18(H)  Sodium 135 - 145 mmol/L 138  Potassium 3.5 - 5.1 mmol/L 4.1  Chloride 98 - 111 mmol/L 102  CO2 22 - 32 mmol/L 25  Calcium 8.9 - 10.3 mg/dL 9.5  Total Protein 6.5 - 8.1 g/dL 7.1  Total Bilirubin 0.3 - 1.2 mg/dL 0.6  Alkaline Phos 38 - 126 U/L 55  AST 15 - 41 U/L 16  ALT 0 - 44 U/L 11   CBC Latest Ref Rng & Units 04/27/2021  WBC 4.0 - 10.5 K/uL 9.5  Hemoglobin 12.0 - 15.0 g/dL 13.2   Hematocrit 36.0 - 46.0 % 41.6  Platelets 150 - 400 K/uL 260      Assessment and plan- Patient is a 87 y.o. female who is here for further management of carcinoid tumor  I have reviewed dotatate PET scan images independently and discussed findings with the patient.Overall the size of the mesenteric nodule has remained stable at 3.5 cm and the adjacent nodule was also stable at 2.6 cm.  There was another heard soft tissue nodule now seen in the right lower quadrant 0.9 cm with an SUV of 7.6 and a small focus of increased uptake in the small bowel with an SUV of 14 point 7-1/3 focus that was new about 1.2 cm.  These 3 new foci may indicate some degree of progression as compared to her prior scans.  It remains unclear if her diarrhea is truly related to these findings.  I plan was to start her on octreotide if her diarrhea did not improve with conventional measures.  However patient states today that her diarrhea is much better after her diet has changed.  We discussed that if we proceed with octreotide it is done once a month and typically done until progression or toxicity.  That octreotide does not need to significant tumor shrinkage but mainly works on functional symptoms.  She did give a urine 5-HIAA sample today which is currently pending.  Chromogranin level  have not fluctuated much and have remained stable in the last 1 year.  The plan is to hold off on octreotide at this time and reassess her symptoms in 2 months  Patient will call us sooner if her diarrhea again gets worse  Patient does have baseline B12 deficiency and will continue to get monthly B12 injections including today Visit Diagnosis 1. B12 deficiency   2. Malignant carcinoid tumor of small intestine, unspecified location [facility])      Dr. Randa Evens, MD, MPH [facility] [phone] 04/28/2021 4:41 PM

## 2021-05-02 LAB — 5 HIAA, QUANTITATIVE, URINE, 24 HOUR
5-HIAA, Ur: 13.8 mg/L
5-HIAA,Quant.,24 Hr Urine: 24.2 mg/24 hr — ABNORMAL HIGH (ref 0.0–14.9)
Total Volume: 1750

## 2021-05-08 ENCOUNTER — Other Ambulatory Visit: Payer: Self-pay | Admitting: Family Medicine

## 2021-05-08 DIAGNOSIS — L282 Other prurigo: Secondary | ICD-10-CM

## 2021-05-10 ENCOUNTER — Ambulatory Visit (INDEPENDENT_AMBULATORY_CARE_PROVIDER_SITE_OTHER): Payer: Medicare HMO

## 2021-05-10 DIAGNOSIS — Z0000 Encounter for general adult medical examination without abnormal findings: Secondary | ICD-10-CM | POA: Diagnosis not present

## 2021-05-10 NOTE — Progress Notes (Signed)
Name: Susan Bowen   MRN: [id number]    DOB: [birth year]   Date:05/14/2084       Progress Note  Subjective  Chief Complaint  Follow Up  HPI  Thyroid mass: US done in 10/2020 showed, history of thyroid ablation, taking levothyroxine 50 mcg and twice a week she takes two tablets ( Tuesday and Fridays)   IMPRESSION: 1. Atrophic and moderately heterogeneous thyroid without worrisome new or enlarging thyroid nodules 2. Dominant right-sided thyroid nodule has decreased in size compared to the 2015 examination. Imaging stability for greater than 5 years is indicative of benign etiology. 3. None of the other discretely measured nodules/pseudo nodules meet imaging criteria to recommend percutaneous sampling or continued dedicated follow-up.  Intertrigo and pruritis: she saw the Dermatologist and was released, using topical medications as needed and states no longer itching.   DMII with microalbuminuria; she is taking medications, ARB and statin therapy. She has not been checking her glucose lately   She has occasional polyphagia, polydipsia but no polyuria.  Her hgbA1C was 10.5%, dropped to 8.5% 7.8%, 7.5% , up to 7.8%  ,8 %  ,  7.8 % , 6.3 %. Today is  6.7 %  She has been taking Trulicity and , Metformin, Jardiance, denies side effects of medication. Marland Kitchen    HTN: she has been taking medication. No chest pain, but occasionally has heart flutter. . She is on Norvasc, carvedilol and Benicar given by cardiologist. She states going to have an echo soon    Afib : under the care of Dr. Fletcher Anon, rate controlled, she has sob with activity but very seldom, she still has some palpitation intermittent that happens usually at night but lasts only a few seconds., she has some lower extremity edema, likely from norvasc. She also has pulmonary hypertension and will have and echo soon.  On Eliquis , denies any bleeding.    Hyperlipidemia: taking Pravastatin and denies side effects of medications, no myalgia.Reviewed  labs with patient    Pulmonary nodule: seen by Dr. Raul Del, lung nodule stable and finished CT in 2017 .She is no longer seeing him. She is now seeing Dr. Janese Banks and had a CT chest done 08/2019. CT showed possible pulmonary hypertension. Pulmonary nodules unchanged since 2017, released from him .    Aorta atherosclerosis on CT chest: on statin and Eliquis. Unchanged    Carcinoid tumor with metastasis: incidental finding on CT abdomen done for evaluation of renal stone back in 03/26/2019, it showed retroperitoneal lymphadenopathy, she has since seen Dr. Janese Banks and has been diagnosed with carcinoid tumor with metastasis and going to have watchful waiting. since she has been asymptomatic. Unchanged   Malnutrition; weight went down from 240 lbs to 193 lbs in about 4 months, but weight is finally stable, she is better now, diarrhea resolved She has been drinking protein shakes    Anemia and B12 deficiency: under the care of Dr. Janese Banks , getting B12 injections   Patient Active Problem List   Diagnosis Date Noted   Iron deficiency anemia 12/04/2020   Carcinoid tumor, malignant ([place]) 09/08/2019   Pain in joint of left shoulder 02/25/2019   Pulmonary hypertension, unspecified ([place]) 12/28/2018   Paroxysmal A-fib ([place]) 12/28/2018   Chronic kidney disease, stage III (moderate) ([place]) 04/01/2018   Elevated parathyroid hormone 01/01/2018   Elevated uric acid in blood 04/29/2017   Plantar fasciitis of left foot 03/12/2016   Postprocedural hypothyroidism 01/23/2016   Aortic stenosis 09/26/2015   Allergic rhinitis, seasonal 08/08/2015  Benign hypertension 08/08/2015   History of pneumonia 08/08/2015   Carpal tunnel syndrome 08/08/2015   Cataract 08/08/2015   Insomnia, persistent 08/08/2015   Dyslipidemia 08/08/2015   History of depression 08/08/2015   Glaucoma 08/08/2015   Controlled gout 08/08/2015   Fever blister 08/08/2015   Bilateral hearing loss 08/08/2015   Hemorrhoid 08/08/2015   H/O iron  deficiency anemia 08/08/2015   Benign neoplasm of colon 08/08/2015   Impingement syndrome of shoulder 08/08/2015   Osteoporosis, post-menopausal 08/08/2015   Morbid obesity ([place]) 08/08/2015   Generalized OA 08/08/2015   Multinodular goiter 08/08/2015   Asymptomatic varicose veins 08/08/2015   Polyp of vocal cord 08/08/2015   History of vertebral compression fracture 08/08/2015   Diabetes mellitus with proteinuric diabetic nephropathy ([place]) 08/08/2015   LVH (left ventricular hypertrophy) 08/08/2015   Moderate tricuspid regurgitation 08/08/2015   History of radioactive iodine thyroid ablation 08/08/2015   Lung nodule, solitary 05/12/2014   Chronic cough 05/11/2014   Vitamin D deficiency 12/20/2009   Plantar fascial fibromatosis 12/20/2009   Personal history of fall 07/20/2008   Cervical radiculitis 05/11/2008    Past Surgical History:  Procedure Laterality Date   ABDOMINAL HYSTERECTOMY  1986   APPENDECTOMY     BREAST SURGERY Bilateral 1983   biopsy of each side, negative   CARPAL TUNNEL RELEASE     CATARACT EXTRACTION W/PHACO Left 09/03/2017   Procedure: CATARACT EXTRACTION PHACO AND INTRAOCULAR LENS PLACEMENT (IOC)-LEFT DIABETIC;  Surgeon: Birder Robson, MD;  Location: [facility] ORS;  Service: Ophthalmology;  Laterality: Left;  [place] 01:10.2 AP% 16.7 CDE 11.71 Fluid Pack Lot # [id number]   CATARACT EXTRACTION W/PHACO Right 09/30/2017   Procedure: CATARACT EXTRACTION PHACO AND INTRAOCULAR LENS PLACEMENT (IOC);  Surgeon: Birder Robson, MD;  Location: [facility] ORS;  Service: Ophthalmology;  Laterality: Right;  [place] 01:35.1 AP% 13.1 CDE 12.44 Fluid pack Lot # [id number]   COLON SURGERY     EYE SURGERY Left 09/03/2017   Cataract extraction   FEMUR FRACTURE SURGERY Left    FRACTURE SURGERY  2001   left femur   HEMORROIDECTOMY     polyp removed from vocal cord      Family History  Problem Relation Age of Onset   Diabetes Mother    Hypertension Mother    Dementia Mother     Hypertension Son     Social History   Tobacco Use   Smoking status: Never   Smokeless tobacco: Never   Tobacco comments:    Smoking cessation materials not required  Substance Use Topics   Alcohol use: No    Alcohol/week: 0.0 standard drinks     Current Outpatient Medications:    acetaminophen-codeine (TYLENOL #3) 300-30 MG tablet, TAKE 1 TABLET BY MOUTH EVERY 6 HOURS AS NEEDED FOR CHRONIC PAIN, Disp: 30 tablet, Rfl: 0   allopurinol (ZYLOPRIM) 100 MG tablet, TAKE 1 TABLET BY MOUTH EVERY DAY, Disp: 90 tablet, Rfl: 1   amLODipine (NORVASC) 10 MG tablet, Take 1 tablet (10 mg total) by mouth daily., Disp: 90 tablet, Rfl: 3   COMBIGAN 0.2-0.5 % ophthalmic solution, Place 1 drop into both eyes 2 (two) times daily., Disp: , Rfl:    ELIQUIS 5 MG TABS tablet, TAKE 1 TABLET BY MOUTH TWICE A DAY, Disp: 180 tablet, Rfl: 1   hydrOXYzine (ATARAX/VISTARIL) 10 MG tablet, TAKE 1 TABLET BY MOUTH EVERYDAY AT BEDTIME, Disp: 90 tablet, Rfl: 0   levothyroxine (SYNTHROID) 50 MCG tablet, Take 1 tablet (50 mcg total) by mouth daily before breakfast.  And take two tablets twice a week, Disp: 114 tablet, Rfl: 1   loratadine (CLARITIN) 10 MG tablet, Take 1 tablet (10 mg total) by mouth daily., Disp: 90 tablet, Rfl: 1   pravastatin (PRAVACHOL) 40 MG tablet, TAKE 1 TABLET BY MOUTH EVERY DAY, Disp: 90 tablet, Rfl: 2   tacrolimus (PROTOPIC) 0.1 % ointment, Apply topically as directed. Bid to rash under breast and in groin for 2 weeks, then decrease to qd until clear, then prn flares, Disp: 100 g, Rfl: 2   Travoprost, BAK Free, (TRAVATAN) 0.004 % SOLN ophthalmic solution, Place 1 drop into both eyes at bedtime., Disp: , Rfl:    Vitamin D, Ergocalciferol, (DRISDOL) 1.25 MG (50000 UNIT) CAPS capsule, TAKE 1 CAPSULE BY MOUTH WEEKLY, Disp: 12 capsule, Rfl: 1   carvedilol (COREG) 25 MG tablet, Take 1 tablet (25 mg total) by mouth 2 (two) times daily. New dose, Disp: 180 tablet, Rfl: 1   Dulaglutide (TRULICITY) 3 ZO/1.0RU  SOPN, Inject 3 mg as directed once a week., Disp: 12 mL, Rfl: 1   empagliflozin (JARDIANCE) 25 MG TABS tablet, Take 1 tablet (25 mg total) by mouth daily., Disp: 90 tablet, Rfl: 1   furosemide (LASIX) 20 MG tablet, Take 1 tablet (20 mg total) by mouth daily., Disp: 90 tablet, Rfl: 1   glucose blood (ACCU-CHEK AVIVA PLUS) test strip, Check fasting glucose once each morning, and check glucose as needed. (Patient not taking: Reported on 05/14/2021), Disp: 100 each, Rfl: 12   Lancets (ACCU-CHEK MULTICLIX) lancets, Use as instructed (Patient not taking: Reported on 05/14/2021), Disp: 204 each, Rfl: 12   metFORMIN (GLUCOPHAGE) 850 MG tablet, Take 1 tablet (850 mg total) by mouth 2 (two) times daily., Disp: 180 tablet, Rfl: 1   MITIGARE 0.6 MG CAPS, TAKE 1-2 CAPSULES (0.6-1.2 MG TOTAL) BY MOUTH DAILY AS NEEDED. FOR GOUT, NOT DAILY (Patient not taking: Reported on 05/14/2021), Disp: 30 capsule, Rfl: 0   nystatin (MYCOSTATIN/NYSTOP) powder, Apply 1 application topically 2 (two) times daily as needed. (Patient not taking: No sig reported), Disp: 30 g, Rfl: 0   olmesartan (BENICAR) 40 MG tablet, Take 1 tablet (40 mg total) by mouth daily., Disp: 90 tablet, Rfl: 1   potassium chloride SA (KLOR-CON) 20 MEQ tablet, Take 1 tablet (20 mEq total) by mouth daily., Disp: 90 tablet, Rfl: 1 No current facility-administered medications for this visit.  Facility-Administered Medications Ordered in Other Visits:    cyanocobalamin ((VITAMIN B-12)) injection 1,000 mcg, 1,000 mcg, Intramuscular, Q30 days, Sindy Guadeloupe, MD, 1,000 mcg at 12/20/20 1414   cyanocobalamin ((VITAMIN B-12)) injection 1,000 mcg, 1,000 mcg, Intramuscular, Q30 days, Sindy Guadeloupe, MD, 1,000 mcg at 03/05/21 1341  Allergies  Allergen Reactions   Augmentin [Amoxicillin-Pot Clavulanate] Itching    Has patient had a PCN reaction causing immediate rash, facial/tongue/throat swelling, SOB or lightheadedness with hypotension: No Has patient had a PCN  reaction causing severe rash involving mucus membranes or skin necrosis: No Has patient had a PCN reaction that required hospitalization: No Has patient had a PCN reaction occurring within the last 10 years: No If all of the above answers are "NO", then may proceed with Cephalosporin use.     I personally reviewed active problem list, medication list, allergies, family history, social history, health maintenance with the patient/caregiver today.   ROS  Constitutional: Negative for fever or weight change.  Respiratory: Negative for cough and shortness of breath.   Cardiovascular: Negative for chest pain or palpitations.  Gastrointestinal: Negative  for abdominal pain, no bowel changes.  Musculoskeletal: Negative for gait problem or joint swelling.  Skin: Negative for rash.  Neurological: Negative for dizziness or headache.  No other specific complaints in a complete review of systems (except as listed in HPI above).   Objective  Vitals:   05/14/21 1031  BP: 130/72  Pulse: 94  Resp: 16  Temp: 98 F (36.7 C)  TempSrc: Oral  SpO2: 97%  Weight: 193 lb 8 oz (87.8 kg)  Height: 5\' 5"  (1.651 m)    Body mass index is 32.2 kg/m.  Physical Exam  Constitutional: Patient appears well-developed and well-nourished. Obese  No distress.  HEENT: head atraumatic, normocephalic, pupils equal and reactive to light, neck supple Cardiovascular: Normal rate, regular rhythm and normal heart sounds.  No murmur heard. Trace BLE edema. Pulmonary/Chest: Effort normal and breath sounds normal. No respiratory distress. Abdominal: Soft.  There is no tenderness. Psychiatric: Patient has a normal mood and affect. behavior is normal. Judgment and thought content normal.   Recent Results (from the past 2160 hour(s))  Vitamin B12     Status: Abnormal   Collection Time: 03/05/21  1:21 PM  Result Value Ref Range   Vitamin B-12 120 (L) 180 - 914 pg/mL    Comment: (NOTE) This assay is not validated for  testing neonatal or myeloproliferative syndrome specimens for Vitamin B12 levels. Performed at [facility], [place] [address], [place], [place] [address]   Iron and TIBC     Status: Abnormal   Collection Time: 03/05/21  1:21 PM  Result Value Ref Range   Iron 37 28 - 170 ug/dL   TIBC 374 250 - 450 ug/dL   Saturation Ratios 10 (L) 10.4 - 31.8 %   UIBC 337 ug/dL    Comment: Performed at [facility], [address] [place], [place] [address]  Ferritin     Status: None   Collection Time: 03/05/21  1:21 PM  Result Value Ref Range   Ferritin 54 11 - 307 ng/mL    Comment: Performed at [facility], [address] [place], [place] [address]  CBC with Differential     Status: Abnormal   Collection Time: 03/05/21  1:21 PM  Result Value Ref Range   WBC 9.3 4.0 - 10.5 K/uL   RBC 4.61 3.87 - 5.11 MIL/uL   Hemoglobin 12.9 12.0 - 15.0 g/dL   HCT 40.3 36.0 - 46.0 %   MCV 87.4 80.0 - 100.0 fL   MCH 28.0 26.0 - 34.0 pg   MCHC 32.0 30.0 - 36.0 g/dL   RDW 18.3 (H) 11.5 - 15.5 %   Platelets 270 150 - 400 K/uL   nRBC 0.0 0.0 - 0.2 %   Neutrophils Relative % 58 %   Neutro Abs 5.3 1.7 - 7.7 K/uL   Lymphocytes Relative 34 %   Lymphs Abs 3.2 0.7 - 4.0 K/uL   Monocytes Relative 6 %   Monocytes Absolute 0.6 0.1 - 1.0 K/uL   Eosinophils Relative 2 %   Eosinophils Absolute 0.2 0.0 - 0.5 K/uL   Basophils Relative 0 %   Basophils Absolute 0.0 0.0 - 0.1 K/uL   Immature Granulocytes 0 %   Abs Immature Granulocytes 0.04 0.00 - 0.07 K/uL    Comment: Performed at [facility], [address], [place], [place] [address]  Basic metabolic panel     Status: Abnormal   Collection Time: 03/09/21  4:50 PM  Result Value Ref Range  Sodium 138 135 - 145 mmol/L   Potassium 4.4 3.5 - 5.1 mmol/L   Chloride 103 98 - 111 mmol/L   CO2 26 22 - 32 mmol/L   Glucose, Bld 151 (H) 70 - 99 mg/dL    Comment: Glucose reference range applies only to samples taken after fasting for at least 8  hours.   BUN 14 8 - 23 mg/dL   Creatinine, Ser 1.25 (H) 0.44 - 1.00 mg/dL   Calcium 10.0 8.9 - 10.3 mg/dL   GFR, Estimated 42 (L) >60 mL/min    Comment: (NOTE) Calculated using the CKD-EPI Creatinine Equation (2021)    Anion gap 9 5 - 15    Comment: Performed at [facility], [address] [place], [place] [address]  CBC     Status: Abnormal   Collection Time: 03/09/21  4:50 PM  Result Value Ref Range   WBC 10.3 4.0 - 10.5 K/uL   RBC 4.74 3.87 - 5.11 MIL/uL   Hemoglobin 13.0 12.0 - 15.0 g/dL   HCT 41.4 36.0 - 46.0 %   MCV 87.3 80.0 - 100.0 fL   MCH 27.4 26.0 - 34.0 pg   MCHC 31.4 30.0 - 36.0 g/dL   RDW 17.6 (H) 11.5 - 15.5 %   Platelets 271 150 - 400 K/uL   nRBC 0.0 0.0 - 0.2 %    Comment: Performed at [facility], [address] [place], [place] [address]  Urinalysis, Complete w Microscopic     Status: Abnormal   Collection Time: 03/09/21  4:50 PM  Result Value Ref Range   Color, Urine STRAW (A) YELLOW   APPearance CLEAR (A) CLEAR   Specific Gravity, Urine 1.007 1.005 - 1.030   pH 5.0 5.0 - 8.0   Glucose, UA >=500 (A) NEGATIVE mg/dL   Hgb urine dipstick LARGE (A) NEGATIVE   Bilirubin Urine NEGATIVE NEGATIVE   Ketones, ur NEGATIVE NEGATIVE mg/dL   Protein, ur NEGATIVE NEGATIVE mg/dL   Nitrite NEGATIVE NEGATIVE   Leukocytes,Ua NEGATIVE NEGATIVE   RBC / HPF 0-5 0 - 5 RBC/hpf   WBC, UA 0-5 0 - 5 WBC/hpf   Bacteria, UA NONE SEEN NONE SEEN   Squamous Epithelial / LPF NONE SEEN 0 - 5   Mucus PRESENT     Comment: Performed at [facility], [address] [place], [place] [address]  CBG monitoring, ED     Status: Abnormal   Collection Time: 03/09/21  4:50 PM  Result Value Ref Range   Glucose-Capillary 142 (H) 70 - 99 mg/dL    Comment: Glucose reference range applies only to samples taken after fasting for at least 8 hours.  Troponin I (High Sensitivity)     Status: None   Collection Time: 03/09/21  5:32 PM  Result Value Ref Range    Troponin I (High Sensitivity) 4 <18 ng/L    Comment: (NOTE) Elevated high sensitivity troponin I (hsTnI) values and significant  changes across serial measurements may suggest ACS but many other  chronic and acute conditions are known to elevate hsTnI results.  Refer to the "Links" section for chest pain algorithms and additional  guidance. Performed at [facility], [address] [place], [place] [address]   BASIC METABOLIC PANEL WITH GFR     Status: Abnormal   Collection Time: 03/22/21 11:30 AM  Result Value Ref Range   Glucose, Bld 106 (H) 65 - 99 mg/dL    Comment: .  Fasting reference interval . For someone without known diabetes, a glucose value between 100 and 125 mg/dL is consistent with prediabetes and should be confirmed with a follow-up test. .    BUN 14 7 - 25 mg/dL   Creat 1.23 (H) 0.60 - 0.88 mg/dL    Comment: For patients >12 years of age, the reference limit for Creatinine is approximately 13% higher for people identified as African-American. .    GFR, Est Non African American 39 (L) > OR = 60 mL/min/1.66m2   GFR, Est African American 46 (L) > OR = 60 mL/min/1.28m2   BUN/Creatinine Ratio 11 6 - 22 (calc)   Sodium 138 135 - 146 mmol/L   Potassium 4.2 3.5 - 5.3 mmol/L   Chloride 103 98 - 110 mmol/L   CO2 26 20 - 32 mmol/L   Calcium 9.7 8.6 - 10.4 mg/dL  TSH     Status: Abnormal   Collection Time: 03/22/21 11:30 AM  Result Value Ref Range   TSH 9.07 (H) 0.40 - 4.50 mIU/L  Urinalysis, Complete     Status: Abnormal   Collection Time: 03/22/21 11:30 AM  Result Value Ref Range   Color, Urine YELLOW YELLOW   APPearance CLEAR CLEAR   Specific Gravity, Urine 1.011 1.001 - 1.035   pH < OR = 5.0 5.0 - 8.0   Glucose, UA 3+ (A) NEGATIVE   Bilirubin Urine NEGATIVE NEGATIVE   Ketones, ur NEGATIVE NEGATIVE   Hgb urine dipstick NEGATIVE NEGATIVE   Protein, ur NEGATIVE NEGATIVE   Nitrite NEGATIVE NEGATIVE   Leukocytes,Ua NEGATIVE NEGATIVE    WBC, UA NONE SEEN 0 - 5 /HPF   RBC / HPF NONE SEEN 0 - 2 /HPF   Squamous Epithelial / LPF NONE SEEN < OR = 5 /HPF   Bacteria, UA NONE SEEN NONE SEEN /HPF   Hyaline Cast NONE SEEN NONE SEEN /LPF  CULTURE, URINE COMPREHENSIVE     Status: None   Collection Time: 03/22/21 11:30 AM   Specimen: Urine  Result Value Ref Range   MICRO NUMBER: [id number]    SPECIMEN QUALITY: Adequate    Source OTHER (SPECIFY)    STATUS: FINAL    RESULT: No Growth   Chromogranin A     Status: Abnormal   Collection Time: 03/28/21  8:49 AM  Result Value Ref Range   Chromogranin A (ng/mL) 256.4 (H) 0.0 - 101.8 ng/mL    Comment: (NOTE) Results of this test are labeled for research purposes only by the assay's manufacturer. The performance characteristics of this assay have not been established by the manufacturer. The result should not be used for treatment or for diagnostic purposes without confirmation of the diagnosis by another medically established diagnostic product or procedure. The performance characteristics were determined by [facility]. Chromogranin A performed by [employer] methodology Values obtained with different assay methods or kits cannot be used interchangeably. Performed At: [facility] [place], [place] [id number] Rush Farmer MD [id number]   Comprehensive metabolic panel     Status: Abnormal   Collection Time: 03/28/21  8:49 AM  Result Value Ref Range   Sodium 138 135 - 145 mmol/L   Potassium 4.1 3.5 - 5.1 mmol/L   Chloride 102 98 - 111 mmol/L   CO2 25 22 - 32 mmol/L   Glucose, Bld 135 (H) 70 - 99 mg/dL    Comment: Glucose reference range applies only to samples taken after fasting for at least 8 hours.   BUN 14  8 - 23 mg/dL   Creatinine, Ser 1.18 (H) 0.44 - 1.00 mg/dL   Calcium 9.5 8.9 - 10.3 mg/dL   Total Protein 7.1 6.5 - 8.1 g/dL   Albumin 3.8 3.5 - 5.0 g/dL   AST 16 15 - 41 U/L   ALT 11 0 - 44 U/L   Alkaline Phosphatase 55 38 - 126  U/L   Total Bilirubin 0.6 0.3 - 1.2 mg/dL   GFR, Estimated 45 (L) >60 mL/min    Comment: (NOTE) Calculated using the CKD-EPI Creatinine Equation (2021)    Anion gap 11 5 - 15    Comment: Performed at [facility], [address] [place], [place] [address]  CBC with Differential     Status: Abnormal   Collection Time: 03/28/21  8:49 AM  Result Value Ref Range   WBC 8.9 4.0 - 10.5 K/uL   RBC 4.59 3.87 - 5.11 MIL/uL   Hemoglobin 12.8 12.0 - 15.0 g/dL   HCT 40.1 36.0 - 46.0 %   MCV 87.4 80.0 - 100.0 fL   MCH 27.9 26.0 - 34.0 pg   MCHC 31.9 30.0 - 36.0 g/dL   RDW 17.0 (H) 11.5 - 15.5 %   Platelets 280 150 - 400 K/uL   nRBC 0.0 0.0 - 0.2 %   Neutrophils Relative % 66 %   Neutro Abs 5.8 1.7 - 7.7 K/uL   Lymphocytes Relative 27 %   Lymphs Abs 2.4 0.7 - 4.0 K/uL   Monocytes Relative 5 %   Monocytes Absolute 0.5 0.1 - 1.0 K/uL   Eosinophils Relative 2 %   Eosinophils Absolute 0.2 0.0 - 0.5 K/uL   Basophils Relative 0 %   Basophils Absolute 0.0 0.0 - 0.1 K/uL   Immature Granulocytes 0 %   Abs Immature Granulocytes 0.02 0.00 - 0.07 K/uL    Comment: Performed at [facility], [place], [place] [address]  5 HIAA, quantitative, urine, 24 hour     Status: Abnormal   Collection Time: 04/26/21  7:30 AM  Result Value Ref Range   5-HIAA, Ur 13.8 Undefined mg/L    Comment: (NOTE) This test was developed and its performance characteristics determined by [facility]. It has not been cleared or approved by the Food and Drug Administration.    5-HIAA,Quant.,24 Hr Urine 24.2 (H) 0.0 - 14.9 mg/24 hr    Comment: (NOTE) Performed At: [facility] [place], [place] [id number] Rush Farmer MD [id number]    Total Volume 1,750     Comment: Performed at [facility], [address] [place], [place] [address]  Vitamin B12     Status: Abnormal   Collection Time: 04/27/21 10:38 AM  Result Value Ref Range   Vitamin B-12 171 (L) 180 - 914  pg/mL    Comment: (NOTE) This assay is not validated for testing neonatal or myeloproliferative syndrome specimens for Vitamin B12 levels. Performed at [facility], [place] [address], [place], [place] [address]   Iron and TIBC     Status: None   Collection Time: 04/27/21 10:38 AM  Result Value Ref Range   Iron 44 28 - 170 ug/dL   TIBC 365 250 - 450 ug/dL   Saturation Ratios 12 10.4 - 31.8 %   UIBC 321 ug/dL    Comment: Performed at [facility], [address], [place], [place] [address]  Ferritin     Status: None   Collection Time: 04/27/21 10:38 AM  Result Value Ref Range   Ferritin 48 11 - 307 ng/mL    Comment: Performed at [facility], [address] [place], [place] [address]  CBC with Differential     Status: None   Collection Time: 04/27/21 10:38 AM  Result Value Ref Range   WBC 9.5 4.0 - 10.5 K/uL   RBC 4.71 3.87 - 5.11 MIL/uL   Hemoglobin 13.2 12.0 - 15.0 g/dL   HCT 41.6 36.0 - 46.0 %   MCV 88.3 80.0 - 100.0 fL   MCH 28.0 26.0 - 34.0 pg   MCHC 31.7 30.0 - 36.0 g/dL   RDW 15.3 11.5 - 15.5 %   Platelets 260 150 - 400 K/uL   nRBC 0.0 0.0 - 0.2 %   Neutrophils Relative % 60 %   Neutro Abs 5.6 1.7 - 7.7 K/uL   Lymphocytes Relative 31 %   Lymphs Abs 3.0 0.7 - 4.0 K/uL   Monocytes Relative 7 %   Monocytes Absolute 0.7 0.1 - 1.0 K/uL   Eosinophils Relative 1 %   Eosinophils Absolute 0.1 0.0 - 0.5 K/uL   Basophils Relative 1 %   Basophils Absolute 0.1 0.0 - 0.1 K/uL   Immature Granulocytes 0 %   Abs Immature Granulocytes 0.03 0.00 - 0.07 K/uL    Comment: Performed at [facility], [place], [place] [address]  POCT HgB A1C     Status: Abnormal   Collection Time: 05/14/21 10:36 AM  Result Value Ref Range   Hemoglobin A1C 6.7 (A) 4.0 - 5.6 %   HbA1c POC (<> result, manual entry)     HbA1c, POC (prediabetic range)     HbA1c, POC (controlled diabetic range)      Diabetic Foot Exam: Diabetic Foot Exam - Simple   Simple  Foot Form Diabetic Foot exam was performed with the following findings: Yes 05/14/2021 11:11 AM  Visual Inspection No deformities, no ulcerations, no other skin breakdown bilaterally: Yes Sensation Testing Intact to touch and monofilament testing bilaterally: Yes Pulse Check Posterior Tibialis and Dorsalis pulse intact bilaterally: Yes Comments     PHQ2/9: Depression screen [facility] 2/9 05/14/2021 05/10/2021 03/22/2021 01/10/2021 10/16/2020  Decreased Interest 0 0 0 0 0  Down, Depressed, Hopeless 0 0 0 0 0  PHQ - 2 Score 0 0 0 0 0  Altered sleeping - - - - -  Tired, decreased energy - - - - -  Change in appetite - - - - -  Feeling bad or failure about yourself  - - - - -  Trouble concentrating - - - - -  Moving slowly or fidgety/restless - - - - -  Suicidal thoughts - - - - -  PHQ-9 Score - - - - -  Difficult doing work/chores - - - - -  Some recent data might be hidden    phq 9 is negative   Fall Risk: Fall Risk  05/14/2021 05/10/2021 03/22/2021 01/10/2021 10/16/2020  Falls in the past year? - 0 0 0 0  Number falls in past yr: 0 0 0 0 0  Injury with Fall? 0 0 0 0 0  Risk for fall due to : - No Fall Risks - - -  Follow up Falls evaluation completed Falls prevention discussed - - -     Functional Status Survey: Is the patient deaf or have difficulty hearing?: Yes Does the patient have difficulty seeing, even when wearing glasses/contacts?: Yes Does the patient have difficulty concentrating,  remembering, or making decisions?: No Does the patient have difficulty walking or climbing stairs?: No Does the patient have difficulty dressing or bathing?: No Does the patient have difficulty doing errands alone such as visiting a doctor's office or shopping?: No    Assessment & Plan  1. 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  Dispense: 12 mL;  Refill: 1 - empagliflozin (JARDIANCE) 25 MG TABS tablet; Take 1 tablet (25 mg total) by mouth daily.  Dispense: 90 tablet; Refill: 1 - metFORMIN (GLUCOPHAGE) 850 MG tablet; Take 1 tablet (850 mg total) by mouth 2 (two) times daily.  Dispense: 180 tablet; Refill: 1  2. Hypothyroidism, postablative  - TSH  3. Vitamin D deficiency   4. B12 deficiency   5. Malignant carcinoid tumor of small intestine, unspecified location [facility])  Keep follow up with oncologist   6. Paroxysmal A-fib (HCC)  Rate controlled   7. Dyslipidemia associated with type 2 diabetes mellitus (HCC)  - Dulaglutide (TRULICITY) 3 BJ/4.7WG SOPN; Inject 3 mg as directed once a week.  Dispense: 12 mL; Refill: 1 - empagliflozin (JARDIANCE) 25 MG TABS tablet; Take 1 tablet (25 mg total) by mouth daily.  Dispense: 90 tablet; Refill: 1 - metFORMIN (GLUCOPHAGE) 850 MG tablet; Take 1 tablet (850 mg total) by mouth 2 (two) times daily.  Dispense: 180 tablet; Refill: 1  8. Atherosclerosis of aorta ([place])   9. Gastroesophageal reflux disease without esophagitis   10. History of gout   11. Benign hypertension  - carvedilol (COREG) 25 MG tablet; Take 1 tablet (25 mg total) by mouth 2 (two) times daily. New dose  Dispense: 180 tablet; Refill: 1 - olmesartan (BENICAR) 40 MG tablet; Take 1 tablet (40 mg total) by mouth daily.  Dispense: 90 tablet; Refill: 1  12. Edema of left lower extremity  - furosemide (LASIX) 20 MG tablet; Take 1 tablet (20 mg total) by mouth daily.  Dispense: 90 tablet; Refill: 1 - potassium chloride SA (KLOR-CON) 20 MEQ tablet; Take 1 tablet (20 mEq total) by mouth daily.  Dispense: 90 tablet; Refill: 1  13. Pulmonary hypertension, unspecified ([place])  Going to have echo soon   14. Moderate protein-calorie malnutrition ([place])

## 2021-05-10 NOTE — Progress Notes (Signed)
Subjective:   Susan Bowen is a 86 y.o. female who presents for Medicare Annual (Subsequent) preventive examination.  Virtual Visit via Telephone Note  I connected with  Susan Bowen on 05/10/21 at 11:20 AM EDT by telephone and verified that I am speaking with the correct person using two identifiers.  Location: Patient: home Provider: [place] Persons participating in the virtual visit: [place]   I discussed the limitations, risks, security and privacy concerns of performing an evaluation and management service by telephone and the availability of in person appointments. The patient expressed understanding and agreed to proceed.  Interactive audio and video telecommunications were attempted between this nurse and patient, however failed, due to patient having technical difficulties OR patient did not have access to video capability.  We continued and completed visit with audio only.  Some vital signs may be absent or patient reported.   Susan Marker, LPN   Review of Systems     Cardiac Risk Factors include: advanced age (>59mn, >>60women);diabetes mellitus;hypertension;dyslipidemia;obesity (BMI >30kg/m2)     Objective:    There were no vitals filed for this visit. There is no height or weight on file to calculate BMI.  Advanced Directives 05/10/2021 04/04/2021 03/09/2021 12/04/2020 09/14/2020 05/09/2020 03/09/2020  Does Patient Have a Medical Advance Directive? _0  No No  Does patient want to make changes to medical advance directive? - - - - - Yes (MAU/Ambulatory/Procedural Areas - Information given) -  Would patient like information on creating a medical advance directive? Yes (MAU/Ambulatory/Procedural Areas - Information given) - - - Yes (MAU/Ambulatory/Procedural Areas - Information given) - Yes (Inpatient - patient defers creating a medical advance directive at this time - Information given)    Current Medications (verified) Outpatient Encounter  Medications as of 05/10/2021  Medication Sig   acetaminophen-codeine (TYLENOL #3) 300-30 MG tablet TAKE 1 TABLET BY MOUTH EVERY 6 HOURS AS NEEDED FOR CHRONIC PAIN   allopurinol (ZYLOPRIM) 100 MG tablet TAKE 1 TABLET BY MOUTH EVERY DAY   amLODipine (NORVASC) 10 MG tablet Take 1 tablet (10 mg total) by mouth daily.   Blood Glucose Monitoring Suppl (ACCU-CHEK AVIVA PLUS) w/Device KIT 1 kit by Does not apply route 3 (three) times daily.   carvedilol (COREG) 25 MG tablet Take 1 tablet (25 mg total) by mouth 2 (two) times daily. New dose   COMBIGAN 0.2-0.5 % ophthalmic solution Place 1 drop into both eyes 2 (two) times daily.   Dulaglutide (TRULICITY) 3 MYW/7.3XTSOPN Inject 3 mg as directed once a week.   ELIQUIS 5 MG TABS tablet TAKE 1 TABLET BY MOUTH TWICE A DAY   furosemide (LASIX) 20 MG tablet Take 1 tablet (20 mg total) by mouth daily.   glucose blood (ACCU-CHEK AVIVA PLUS) test strip Check fasting glucose once each morning, and check glucose as needed.   hydrOXYzine (ATARAX/VISTARIL) 10 MG tablet TAKE 1 TABLET BY MOUTH EVERYDAY AT BEDTIME   JARDIANCE 25 MG TABS tablet TAKE 25 MG BY MOUTH DAILY.   Lancets (ACCU-CHEK MULTICLIX) lancets Use as instructed   levothyroxine (SYNTHROID) 50 MCG tablet Take 1 tablet (50 mcg total) by mouth daily before breakfast. And take two tablets twice a week   loratadine (CLARITIN) 10 MG tablet Take 1 tablet (10 mg total) by mouth daily.   metFORMIN (GLUCOPHAGE) 850 MG tablet TAKE 1 TABLET (850 MG TOTAL) BY MOUTH 2 (TWO) TIMES DAILY.   olmesartan (BENICAR) 40 MG tablet Take 1 tablet (40 mg  total) by mouth daily.   potassium chloride SA (KLOR-CON) 20 MEQ tablet Take 1 tablet (20 mEq total) by mouth daily.   pravastatin (PRAVACHOL) 40 MG tablet TAKE 1 TABLET BY MOUTH EVERY DAY   tacrolimus (PROTOPIC) 0.1 % ointment Apply topically as directed. Bid to rash under breast and in groin for 2 weeks, then decrease to qd until clear, then prn flares   Travoprost, BAK Free,  (TRAVATAN) 0.004 % SOLN ophthalmic solution Place 1 drop into both eyes at bedtime.   Vitamin D, Ergocalciferol, (DRISDOL) 1.25 MG (50000 UNIT) CAPS capsule TAKE 1 CAPSULE BY MOUTH WEEKLY   MITIGARE 0.6 MG CAPS TAKE 1-2 CAPSULES (0.6-1.2 MG TOTAL) BY MOUTH DAILY AS NEEDED. FOR GOUT, NOT DAILY (Patient not taking: Reported on 05/10/2021)   nystatin (MYCOSTATIN/NYSTOP) powder Apply 1 application topically 2 (two) times daily as needed. (Patient not taking: Reported on 05/10/2021)   Facility-Administered Encounter Medications as of 05/10/2021  Medication   cyanocobalamin ((VITAMIN B-12)) injection 1,000 mcg   cyanocobalamin ((VITAMIN B-12)) injection 1,000 mcg    Allergies (verified) Augmentin [amoxicillin-pot clavulanate]   History: Past Medical History:  Diagnosis Date   Allergy    Back spasm    Bunion    Carpal tunnel syndrome    Cataracta    Diabetes mellitus without complication ([place])    Generalized osteoarthritis    GERD (gastroesophageal reflux disease)    Gout    Hemorrhoids without complication    Hyperlipidemia    Hypertension    Hypothyroidism    Impingement syndrome of right shoulder    Insomnia    Lipoma    Lung nodule    Metastatic carcinoid tumor ([place]) 03/2016   Neuritis or radiculitis due to rupture of lumbar intervertebral disc    Obesity    Osteoporosis    PAF (paroxysmal atrial fibrillation) ([place])    a. 03/2018 AF w/ RVR-->converted spont.  CHA2DS2VASc = 7-->Eliquis.   Proteinuria    Rotator cuff tear    Systolic murmur    a. 05/6159 Echo: EF 60-65%, no rwma, Gr1 DD, mild MR, mildly dil LA. Nl RV fxn. PASP 29mHg.   TIA (transient ischemic attack)    TIA (transient ischemic attack) 1998   small TIA.  no residual effects   Tinnitus of both ears    Unspecified glaucoma(365.9)    Vitamin D deficiency    Wedge compression fracture of t11-T12 vertebra, sequela    Past Surgical History:  Procedure Laterality Date   ABDOMINAL HYSTERECTOMY  1986   APPENDECTOMY      BREAST SURGERY Bilateral 1983   biopsy of each side, negative   CARPAL TUNNEL RELEASE     CATARACT EXTRACTION W/PHACO Left 09/03/2017   Procedure: CATARACT EXTRACTION PHACO AND INTRAOCULAR LENS PLACEMENT (IOC)-LEFT DIABETIC;  Surgeon: PBirder Robson MD;  Location: [facility] ORS;  Service: Ophthalmology;  Laterality: Left;  [place]:10.2 AP% 16.7 CDE 11.71 Fluid Pack Lot # [id number]  CATARACT EXTRACTION W/PHACO Right 09/30/2017   Procedure: CATARACT EXTRACTION PHACO AND INTRAOCULAR LENS PLACEMENT (IOC);  Surgeon: PBirder Robson MD;  Location: [facility] ORS;  Service: Ophthalmology;  Laterality: Right;  [place]01:35.1 AP% 13.1 CDE 12.44 Fluid pack Lot # [id number]   COLON SURGERY     EYE SURGERY Left 09/03/2017   Cataract extraction   FEMUR FRACTURE SURGERY Left    FRACTURE SURGERY  2001   left femur   HEMORROIDECTOMY     polyp removed from vocal cord     Family History  Problem Relation Age of Onset   Diabetes Mother    Hypertension Mother    Dementia Mother    Hypertension Son    Social History   Socioeconomic History   Marital status: Divorced    Spouse name: Not on file   Number of children: 6   Years of education: Not on file   Highest education level: 10th grade  Occupational History   Not on file  Tobacco Use   Smoking status: Never   Smokeless tobacco: Never   Tobacco comments:    Smoking cessation materials not required  Vaping Use   Vaping Use: Never used  Substance and Sexual Activity   Alcohol use: No    Alcohol/week: 0.0 standard drinks   Drug use: No   Sexual activity: Not Currently  Other Topics Concern   Not on file  Social History Narrative   Pt lives with her son; independent ADL's, no longer drives   Social Determinants of Health   Financial Resource Strain: Low Risk    Difficulty of Paying Living Expenses: Not very hard  Food Insecurity: No Food Insecurity   Worried About Charity fundraiser in the Last Year: Never true   Ran Out of Food in the  Last Year: Never true  Transportation Needs: No Transportation Needs   Lack of Transportation (Medical): No   Lack of Transportation (Non-Medical): No  Physical Activity: Insufficiently Active   Days of Exercise per Week: 7 days   Minutes of Exercise per Session: 10 min  Stress: No Stress Concern Present   Feeling of Stress : Not at all  Social Connections: Moderately Integrated   Frequency of Communication with Friends and Family: More than three times a week   Frequency of Social Gatherings with Friends and Family: Three times a week   Attends Religious Services: More than 4 times per year   Active Member of Clubs or Organizations: Yes   Attends Archivist Meetings: 1 to 4 times per year   Marital Status: Divorced    Tobacco Counseling Counseling given: No Tobacco comments: Smoking cessation materials not required   Clinical Intake:  Pre-visit preparation completed: Yes  Pain : No/denies pain     Nutritional Risks: None Diabetes: Yes CBG done?: No Did pt. bring in CBG monitor from home?: No  How often do you need to have someone help you when you read instructions, pamphlets, or other written materials from your doctor or pharmacy?: 1 - Never  Nutrition Risk Assessment:  Has the patient had any N/V/D within the last 2 months?  No  Does the patient have any non-healing wounds?  No  Has the patient had any unintentional weight loss or weight gain?  No   Diabetes:  Is the patient diabetic?  Yes  If diabetic, was a CBG obtained today?  No  Did the patient bring in their glucometer from home?  No  How often do you monitor your CBG's? Twice weekly per patient.   Financial Strains and Diabetes Management:  Are you having any financial strains with the device, your supplies or your medication? No .  Does the patient want to be seen by Chronic Care Management for management of their diabetes?  No  Would the patient like to be referred to a Nutritionist or for  Diabetic Management?  No   Diabetic Exams:  Diabetic Eye Exam: Completed 01/31/21 negative retinopathy.   Diabetic Foot Exam: Completed 05/10/19. Pt has been advised about the importance  in completing this exam. Pt is scheduled for diabetic foot exam on 05/14/21.    Interpreter Needed?: No  Information entered by :: Susan Marker LPN   Activities of Daily Living In your present state of health, do you have any difficulty performing the following activities: 05/10/2021 03/22/2021  Hearing? Tempie Donning  Comment wears hearing aids -  Vision? Y Y  Difficulty concentrating or making decisions? N N  Walking or climbing stairs? Y Y  Dressing or bathing? N N  Doing errands, shopping? N N  Preparing Food and eating ? N -  Using the Toilet? N -  In the past six months, have you accidently leaked urine? N -  Do you have problems with loss of bowel control? N -  Managing your Medications? N -  Managing your Finances? N -  Housekeeping or managing your Housekeeping? N -  Some recent data might be hidden    Patient Care Team: Steele Sizer, MD as PCP - General (Family Medicine) Wellington Hampshire, MD as PCP - Cardiology (Cardiology) Sindy Guadeloupe, MD as Consulting Physician (Oncology) Cammie Sickle, MD as Consulting Physician (Internal Medicine) Brendolyn Patty, MD (Dermatology)  Indicate any recent Medical Services you may have received from other than [facility] providers in the past year (date may be approximate).     Assessment:   This is a routine wellness examination for Susan Bowen.  Hearing/Vision screen Hearing Screening - Comments:: Pt wears hearing aids maintained by [facility] Vision Screening - Comments:: Annual eye exams with [facility] [place] Dr. Michelene Heady  Dietary issues and exercise activities discussed: Current Exercise Habits: Home exercise routine, Type of exercise: calisthenics, Time (Minutes): 10, Frequency (Times/Week): 7, Weekly Exercise (Minutes/Week): 70, Intensity: Mild,  Exercise limited by: orthopedic condition(s)   Goals Addressed   None    Depression Screen PHQ 2/9 Scores 05/10/2021 03/22/2021 01/10/2021 10/16/2020 10/06/2020 09/12/2020 05/09/2020  PHQ - 2 Score 0 0 0 0 0 0 0  PHQ- 9 Score - - - - - - -    Fall Risk Fall Risk  05/10/2021 03/22/2021 01/10/2021 10/16/2020 10/06/2020  Falls in the past year? 0 0 0 0 0  Number falls in past yr: 0 0 0 0 0  Injury with Fall? 0 0 0 0 0  Risk for fall due to : No Fall Risks - - - -  Follow up Falls prevention discussed - - - Falls evaluation completed    FALL RISK PREVENTION PERTAINING TO THE HOME:  Any stairs in or around the home? Yes  If so, are there any without handrails? Yes  2 steps outside Home free of loose throw rugs in walkways, pet beds, electrical cords, etc? Yes  Adequate lighting in your home to reduce risk of falls? Yes   ASSISTIVE DEVICES UTILIZED TO PREVENT FALLS:  Life alert? Yes  Use of a cane, walker or w/c? No  Grab bars in the bathroom? No  Shower chair or bench in shower? No  Elevated toilet seat or a handicapped toilet? No   TIMED UP AND GO:  Was the test performed? No . Telephonic visit.   Cognitive Function: Normal cognitive status assessed by direct observation by this Nurse Health Advisor. No abnormalities found.       6CIT Screen 05/09/2020  What Year? 0 points  What month? 0 points  What time? 0 points  Count back from 20 0 points  Months in reverse 0 points  Repeat phrase 2 points  Total Score 2  Immunizations Immunization History  Administered Date(s) Administered   Fluad Quad(high Dose 65+) 09/10/2019, 09/12/2020   Influenza, High Dose Seasonal PF 08/26/2016, 08/29/2017, 08/03/2018   Influenza, Seasonal, Injecte, Preservative Fre 07/14/2012   Influenza,inj,Quad PF,6+ Mos 08/08/2015   Influenza-Unspecified 09/24/2013, 08/05/2014   PFIZER(Purple Top)SARS-COV-2 Vaccination 12/03/2019, 12/24/2019, 08/30/2020   Pneumococcal Conjugate-13 04/04/2015   Pneumococcal  Polysaccharide-23 04/19/2010   Tdap 04/19/2010   Zoster, Live 11/26/2012    TDAP status: Due, Education has been provided regarding the importance of this vaccine. Advised may receive this vaccine at local pharmacy or Health Dept. Aware to provide a copy of the vaccination record if obtained from local pharmacy or Health Dept. Verbalized acceptance and understanding.  Flu Vaccine status: Up to date  Pneumococcal vaccine status: Up to date  Covid-19 vaccine status: Completed vaccines  Qualifies for Shingles Vaccine? Yes   Zostavax completed Yes   Shingrix Completed?: No.    Education has been provided regarding the importance of this vaccine. Patient has been advised to call insurance company to determine out of pocket expense if they have not yet received this vaccine. Advised may also receive vaccine at local pharmacy or Health Dept. Verbalized acceptance and understanding.  Screening Tests Health Maintenance  Topic Date Due   Zoster Vaccines- Shingrix (1 of 2) Never done   FOOT EXAM  05/09/2020   COVID-19 Vaccine (4 - Booster for Pfizer series) 11/30/2020   TETANUS/TDAP  09/11/2021 (Originally 04/19/2020)   INFLUENZA VACCINE  06/04/2021   HEMOGLOBIN A1C  07/13/2021   OPHTHALMOLOGY EXAM  01/31/2022   DEXA SCAN  Completed   PNA vac Low Risk Adult  Completed   HPV VACCINES  Aged Out    Health Maintenance  Health Maintenance Due  Topic Date Due   Zoster Vaccines- Shingrix (1 of 2) Never done   FOOT EXAM  05/09/2020   COVID-19 Vaccine (4 - Booster for Pfizer series) 11/30/2020    Colorectal cancer screening: No longer required.   Mammogram status: No longer required due to age.  Bone Density status: Completed 12/14/18. Results reflect: Bone density results: OSTEOPENIA. Repeat every 2 years.  Lung Cancer Screening: (Low Dose CT Chest recommended if Age 31-80 years, 30 pack-year currently smoking OR have quit w/in 15years.) does not qualify.   Additional  Screening:  Hepatitis C Screening: does not qualify.  Vision Screening: Recommended annual ophthalmology exams for early detection of glaucoma and other disorders of the eye. Is the patient up to date with their annual eye exam?  Yes  Who is the provider or what is the name of the office in which the patient attends annual eye exams? Dr. George Ina.   Dental Screening: Recommended annual dental exams for proper oral hygiene  Community Resource Referral / Chronic Care Management: CRR required this visit?  No   CCM required this visit?  No      Plan:     I have personally reviewed and noted the following in the patient's chart:   Medical and social history Use of alcohol, tobacco or illicit drugs  Current medications and supplements including opioid prescriptions.  Functional ability and status Nutritional status Physical activity Advanced directives List of other physicians Hospitalizations, surgeries, and ER visits in previous 12 months Vitals Screenings to include cognitive, depression, and falls Referrals and appointments  In addition, I have reviewed and discussed with patient certain preventive protocols, quality metrics, and best practice recommendations. A written personalized care plan for preventive services as well as general preventive health  recommendations were provided to patient.     Susan Marker, LPN   12/08/5359   Nurse Notes: none

## 2021-05-10 NOTE — Patient Instructions (Signed)
Susan Bowen , Thank you for taking time to come for your Medicare Wellness Visit. I appreciate your ongoing commitment to your health goals. Please review the following plan we discussed and let me know if I can assist you in the future.   Screening recommendations/referrals: Colonoscopy: no longer required Mammogram: no longer required Bone Density: done 12/14/18 Recommended yearly ophthalmology/optometry visit for glaucoma screening and checkup Recommended yearly dental visit for hygiene and checkup  Vaccinations: Influenza vaccine: done 09/12/20 Pneumococcal vaccine: done 04/04/15 Tdap vaccine: done 04/19/10; due Shingles vaccine: Shingrix discussed. Please contact your pharmacy for coverage information.  Covid-19: done 12/03/19, 12/24/19 & 08/30/20  Advanced directives: Advance directive discussed with you today. I have provided a copy for you to complete at home and have notarized. Once this is complete please bring a copy in to our office so we can scan it into your chart.   Conditions/risks identified: Keep up the great work!  Next appointment: Follow up in one year for your annual wellness visit    Preventive Care 65 Years and Older, Female Preventive care refers to lifestyle choices and visits with your health care provider that can promote health and wellness. What does preventive care include? A yearly physical exam. This is also called an annual well check. Dental exams once or twice a year. Routine eye exams. Ask your health care provider how often you should have your eyes checked. Personal lifestyle choices, including: Daily care of your teeth and gums. Regular physical activity. Eating a healthy diet. Avoiding tobacco and drug use. Limiting alcohol use. Practicing safe sex. Taking low-dose aspirin every day. Taking vitamin and mineral supplements as recommended by your health care provider. What happens during an annual well check? The services and screenings done by  your health care provider during your annual well check will depend on your age, overall health, lifestyle risk factors, and family history of disease. Counseling  Your health care provider may ask you questions about your: Alcohol use. Tobacco use. Drug use. Emotional well-being. Home and relationship well-being. Sexual activity. Eating habits. History of falls. Memory and ability to understand (cognition). Work and work Statistician. Reproductive health. Screening  You may have the following tests or measurements: Height, weight, and BMI. Blood pressure. Lipid and cholesterol levels. These may be checked every 5 years, or more frequently if you are over 13 years old. Skin check. Lung cancer screening. You may have this screening every year starting at age 37 if you have a 30-pack-year history of smoking and currently smoke or have quit within the past 15 years. Fecal occult blood test (FOBT) of the stool. You may have this test every year starting at age 49. Flexible sigmoidoscopy or colonoscopy. You may have a sigmoidoscopy every 5 years or a colonoscopy every 10 years starting at age 33. Hepatitis C blood test. Hepatitis B blood test. Sexually transmitted disease (STD) testing. Diabetes screening. This is done by checking your blood sugar (glucose) after you have not eaten for a while (fasting). You may have this done every 1-3 years. Bone density scan. This is done to screen for osteoporosis. You may have this done starting at age 53. Mammogram. This may be done every 1-2 years. Talk to your health care provider about how often you should have regular mammograms. Talk with your health care provider about your test results, treatment options, and if necessary, the need for more tests. Vaccines  Your health care provider may recommend certain vaccines, such as: Influenza vaccine. This  is recommended every year. Tetanus, diphtheria, and acellular pertussis (Tdap, Td) vaccine. You  may need a Td booster every 10 years. Zoster vaccine. You may need this after age 33. Pneumococcal 13-valent conjugate (PCV13) vaccine. One dose is recommended after age 32. Pneumococcal polysaccharide (PPSV23) vaccine. One dose is recommended after age 63. Talk to your health care provider about which screenings and vaccines you need and how often you need them. This information is not intended to replace advice given to you by your health care provider. Make sure you discuss any questions you have with your health care provider. Document Released: 11/17/2015 Document Revised: 07/10/2016 Document Reviewed: 08/22/2015 Elsevier Interactive Patient Education  2017 [employer] Prevention in the Home Falls can cause injuries. They can happen to people of all ages. There are many things you can do to make your home safe and to help prevent falls. What can I do on the outside of my home? Regularly fix the edges of walkways and driveways and fix any cracks. Remove anything that might make you trip as you walk through a door, such as a raised step or threshold. Trim any bushes or trees on the path to your home. Use bright outdoor lighting. Clear any walking paths of anything that might make someone trip, such as rocks or tools. Regularly check to see if handrails are loose or broken. Make sure that both sides of any steps have handrails. Any raised decks and porches should have guardrails on the edges. Have any leaves, snow, or ice cleared regularly. Use sand or salt on walking paths during winter. Clean up any spills in your garage right away. This includes oil or grease spills. What can I do in the bathroom? Use night lights. Install grab bars by the toilet and in the tub and shower. Do not use towel bars as grab bars. Use non-skid mats or decals in the tub or shower. If you need to sit down in the shower, use a plastic, non-slip stool. Keep the floor dry. Clean up any water that spills  on the floor as soon as it happens. Remove soap buildup in the tub or shower regularly. Attach bath mats securely with double-sided non-slip rug tape. Do not have throw rugs and other things on the floor that can make you trip. What can I do in the bedroom? Use night lights. Make sure that you have a light by your bed that is easy to reach. Do not use any sheets or blankets that are too big for your bed. They should not hang down onto the floor. Have a firm chair that has side arms. You can use this for support while you get dressed. Do not have throw rugs and other things on the floor that can make you trip. What can I do in the kitchen? Clean up any spills right away. Avoid walking on wet floors. Keep items that you use a lot in easy-to-reach places. If you need to reach something above you, use a strong step stool that has a grab bar. Keep electrical cords out of the way. Do not use floor polish or wax that makes floors slippery. If you must use wax, use non-skid floor wax. Do not have throw rugs and other things on the floor that can make you trip. What can I do with my stairs? Do not leave any items on the stairs. Make sure that there are handrails on both sides of the stairs and use them. Fix handrails that  are broken or loose. Make sure that handrails are as long as the stairways. Check any carpeting to make sure that it is firmly attached to the stairs. Fix any carpet that is loose or worn. Avoid having throw rugs at the top or bottom of the stairs. If you do have throw rugs, attach them to the floor with carpet tape. Make sure that you have a light switch at the top of the stairs and the bottom of the stairs. If you do not have them, ask someone to add them for you. What else can I do to help prevent falls? Wear shoes that: Do not have high heels. Have rubber bottoms. Are comfortable and fit you well. Are closed at the toe. Do not wear sandals. If you use a stepladder: Make  sure that it is fully opened. Do not climb a closed stepladder. Make sure that both sides of the stepladder are locked into place. Ask someone to hold it for you, if possible. Clearly mark and make sure that you can see: Any grab bars or handrails. First and last steps. Where the edge of each step is. Use tools that help you move around (mobility aids) if they are needed. These include: Canes. Walkers. Scooters. Crutches. Turn on the lights when you go into a dark area. Replace any light bulbs as soon as they burn out. Set up your furniture so you have a clear path. Avoid moving your furniture around. If any of your floors are uneven, fix them. If there are any pets around you, be aware of where they are. Review your medicines with your doctor. Some medicines can make you feel dizzy. This can increase your chance of falling. Ask your doctor what other things that you can do to help prevent falls. This information is not intended to replace advice given to you by your health care provider. Make sure you discuss any questions you have with your health care provider. Document Released: 08/17/2009 Document Revised: 03/28/2016 Document Reviewed: 11/25/2014 Elsevier Interactive Patient Education  2017 [employer].

## 2021-05-14 ENCOUNTER — Encounter: Payer: Self-pay | Admitting: Family Medicine

## 2021-05-14 ENCOUNTER — Other Ambulatory Visit: Payer: Self-pay

## 2021-05-14 ENCOUNTER — Ambulatory Visit (INDEPENDENT_AMBULATORY_CARE_PROVIDER_SITE_OTHER): Payer: Medicare HMO | Admitting: Family Medicine

## 2021-05-14 VITALS — BP 130/72 | HR 94 | Temp 98.0°F | Resp 16 | Ht 65.0 in | Wt 193.5 lb

## 2021-05-14 DIAGNOSIS — E890 Postprocedural hypothyroidism: Secondary | ICD-10-CM

## 2021-05-14 DIAGNOSIS — I272 Pulmonary hypertension, unspecified: Secondary | ICD-10-CM

## 2021-05-14 DIAGNOSIS — Z8739 Personal history of other diseases of the musculoskeletal system and connective tissue: Secondary | ICD-10-CM

## 2021-05-14 DIAGNOSIS — K219 Gastro-esophageal reflux disease without esophagitis: Secondary | ICD-10-CM | POA: Diagnosis not present

## 2021-05-14 DIAGNOSIS — R600 Localized edema: Secondary | ICD-10-CM

## 2021-05-14 DIAGNOSIS — I480 Paroxysmal atrial fibrillation: Secondary | ICD-10-CM | POA: Diagnosis not present

## 2021-05-14 DIAGNOSIS — E538 Deficiency of other specified B group vitamins: Secondary | ICD-10-CM | POA: Diagnosis not present

## 2021-05-14 DIAGNOSIS — E1169 Type 2 diabetes mellitus with other specified complication: Secondary | ICD-10-CM

## 2021-05-14 DIAGNOSIS — I700 Atherosclerosis of aorta: Secondary | ICD-10-CM | POA: Diagnosis not present

## 2021-05-14 DIAGNOSIS — C7A019 Malignant carcinoid tumor of the small intestine, unspecified portion: Secondary | ICD-10-CM | POA: Diagnosis not present

## 2021-05-14 DIAGNOSIS — E559 Vitamin D deficiency, unspecified: Secondary | ICD-10-CM | POA: Diagnosis not present

## 2021-05-14 DIAGNOSIS — E785 Hyperlipidemia, unspecified: Secondary | ICD-10-CM

## 2021-05-14 DIAGNOSIS — I10 Essential (primary) hypertension: Secondary | ICD-10-CM

## 2021-05-14 DIAGNOSIS — E440 Moderate protein-calorie malnutrition: Secondary | ICD-10-CM

## 2021-05-14 DIAGNOSIS — E1121 Type 2 diabetes mellitus with diabetic nephropathy: Secondary | ICD-10-CM | POA: Diagnosis not present

## 2021-05-14 LAB — TSH: TSH: 1.62 mIU/L (ref 0.40–4.50)

## 2021-05-14 LAB — POCT GLYCOSYLATED HEMOGLOBIN (HGB A1C): Hemoglobin A1C: 6.7 % — AB (ref 4.0–5.6)

## 2021-05-14 MED ORDER — TRULICITY 3 MG/0.5ML ~~LOC~~ SOAJ
3.0000 mg | SUBCUTANEOUS | 1 refills | Status: DC
Start: 2021-05-14 — End: 2021-07-20

## 2021-05-14 MED ORDER — POTASSIUM CHLORIDE CRYS ER 20 MEQ PO TBCR: 20.0000 meq | EXTENDED_RELEASE_TABLET | Freq: Every day | ORAL | 1 refills | Status: DC

## 2021-05-14 MED ORDER — CARVEDILOL 25 MG PO TABS: 25.0000 mg | ORAL_TABLET | Freq: Two times a day (BID) | ORAL | 1 refills | Status: DC

## 2021-05-14 MED ORDER — EMPAGLIFLOZIN 25 MG PO TABS: 25.0000 mg | ORAL_TABLET | Freq: Every day | ORAL | 1 refills | Status: DC

## 2021-05-14 MED ORDER — OLMESARTAN MEDOXOMIL 40 MG PO TABS: 40.0000 mg | ORAL_TABLET | Freq: Every day | ORAL | 1 refills | Status: DC

## 2021-05-14 MED ORDER — METFORMIN HCL 850 MG PO TABS
850.0000 mg | ORAL_TABLET | Freq: Two times a day (BID) | ORAL | 1 refills | Status: DC
Start: 2021-05-14 — End: 2021-09-18

## 2021-05-14 MED ORDER — FUROSEMIDE 20 MG PO TABS: 20.0000 mg | ORAL_TABLET | Freq: Every day | ORAL | 1 refills | Status: DC

## 2021-05-23 ENCOUNTER — Telehealth: Payer: Self-pay | Admitting: Family Medicine

## 2021-05-23 NOTE — Telephone Encounter (Signed)
Created PA through CoverMyMeds. Waiting on auth

## 2021-05-23 NOTE — Telephone Encounter (Signed)
Pt called and stated that her Rx for Dulaglutide (TRULICITY) 3 BK/4.7JG SOPN Needs a PA/ Pt stated she is due to take it again tomorrow and wants to know if that will be a problem not having it / Pt asked if PA would be done to receive Refill at pharmacy tomorrow / please advise

## 2021-06-01 ENCOUNTER — Other Ambulatory Visit: Payer: Self-pay

## 2021-06-01 DIAGNOSIS — D508 Other iron deficiency anemias: Secondary | ICD-10-CM

## 2021-06-04 ENCOUNTER — Inpatient Hospital Stay: Payer: Medicare HMO | Attending: Oncology

## 2021-06-04 ENCOUNTER — Other Ambulatory Visit: Payer: Self-pay | Admitting: Dermatology

## 2021-06-04 ENCOUNTER — Other Ambulatory Visit: Payer: Self-pay | Admitting: Family Medicine

## 2021-06-04 ENCOUNTER — Other Ambulatory Visit: Payer: Self-pay | Admitting: Podiatry

## 2021-06-04 ENCOUNTER — Inpatient Hospital Stay (HOSPITAL_BASED_OUTPATIENT_CLINIC_OR_DEPARTMENT_OTHER): Payer: Medicare HMO | Admitting: Oncology

## 2021-06-04 ENCOUNTER — Inpatient Hospital Stay: Payer: Medicare HMO

## 2021-06-04 ENCOUNTER — Other Ambulatory Visit: Payer: Self-pay | Admitting: Cardiovascular Disease

## 2021-06-04 ENCOUNTER — Encounter: Payer: Self-pay | Admitting: Oncology

## 2021-06-04 VITALS — BP 126/65 | HR 73 | Temp 98.1°F | Resp 16 | Ht 65.0 in | Wt 191.9 lb

## 2021-06-04 DIAGNOSIS — M1990 Unspecified osteoarthritis, unspecified site: Secondary | ICD-10-CM | POA: Diagnosis not present

## 2021-06-04 DIAGNOSIS — I272 Pulmonary hypertension, unspecified: Secondary | ICD-10-CM | POA: Diagnosis not present

## 2021-06-04 DIAGNOSIS — E669 Obesity, unspecified: Secondary | ICD-10-CM | POA: Diagnosis not present

## 2021-06-04 DIAGNOSIS — M109 Gout, unspecified: Secondary | ICD-10-CM | POA: Diagnosis not present

## 2021-06-04 DIAGNOSIS — E538 Deficiency of other specified B group vitamins: Secondary | ICD-10-CM

## 2021-06-04 DIAGNOSIS — M810 Age-related osteoporosis without current pathological fracture: Secondary | ICD-10-CM | POA: Diagnosis not present

## 2021-06-04 DIAGNOSIS — R197 Diarrhea, unspecified: Secondary | ICD-10-CM | POA: Insufficient documentation

## 2021-06-04 DIAGNOSIS — D519 Vitamin B12 deficiency anemia, unspecified: Secondary | ICD-10-CM | POA: Insufficient documentation

## 2021-06-04 DIAGNOSIS — C7A019 Malignant carcinoid tumor of the small intestine, unspecified portion: Secondary | ICD-10-CM | POA: Diagnosis not present

## 2021-06-04 DIAGNOSIS — L282 Other prurigo: Secondary | ICD-10-CM

## 2021-06-04 DIAGNOSIS — Z7901 Long term (current) use of anticoagulants: Secondary | ICD-10-CM | POA: Diagnosis not present

## 2021-06-04 DIAGNOSIS — K219 Gastro-esophageal reflux disease without esophagitis: Secondary | ICD-10-CM | POA: Insufficient documentation

## 2021-06-04 DIAGNOSIS — D508 Other iron deficiency anemias: Secondary | ICD-10-CM

## 2021-06-04 DIAGNOSIS — Z8673 Personal history of transient ischemic attack (TIA), and cerebral infarction without residual deficits: Secondary | ICD-10-CM | POA: Diagnosis not present

## 2021-06-04 DIAGNOSIS — Z79899 Other long term (current) drug therapy: Secondary | ICD-10-CM | POA: Insufficient documentation

## 2021-06-04 DIAGNOSIS — N189 Chronic kidney disease, unspecified: Secondary | ICD-10-CM | POA: Diagnosis not present

## 2021-06-04 DIAGNOSIS — E559 Vitamin D deficiency, unspecified: Secondary | ICD-10-CM | POA: Insufficient documentation

## 2021-06-04 DIAGNOSIS — E119 Type 2 diabetes mellitus without complications: Secondary | ICD-10-CM | POA: Insufficient documentation

## 2021-06-04 DIAGNOSIS — L304 Erythema intertrigo: Secondary | ICD-10-CM

## 2021-06-04 DIAGNOSIS — E039 Hypothyroidism, unspecified: Secondary | ICD-10-CM | POA: Diagnosis not present

## 2021-06-04 DIAGNOSIS — C7A098 Malignant carcinoid tumors of other sites: Secondary | ICD-10-CM | POA: Diagnosis not present

## 2021-06-04 DIAGNOSIS — I480 Paroxysmal atrial fibrillation: Secondary | ICD-10-CM

## 2021-06-04 DIAGNOSIS — G4700 Insomnia, unspecified: Secondary | ICD-10-CM | POA: Diagnosis not present

## 2021-06-04 DIAGNOSIS — D509 Iron deficiency anemia, unspecified: Secondary | ICD-10-CM | POA: Insufficient documentation

## 2021-06-04 DIAGNOSIS — I129 Hypertensive chronic kidney disease with stage 1 through stage 4 chronic kidney disease, or unspecified chronic kidney disease: Secondary | ICD-10-CM | POA: Diagnosis not present

## 2021-06-04 DIAGNOSIS — Z7984 Long term (current) use of oral hypoglycemic drugs: Secondary | ICD-10-CM | POA: Insufficient documentation

## 2021-06-04 DIAGNOSIS — E785 Hyperlipidemia, unspecified: Secondary | ICD-10-CM | POA: Insufficient documentation

## 2021-06-04 LAB — CBC WITH DIFFERENTIAL/PLATELET
Abs Immature Granulocytes: 0.03 10*3/uL (ref 0.00–0.07)
Basophils Absolute: 0.1 10*3/uL (ref 0.0–0.1)
Basophils Relative: 1 %
Eosinophils Absolute: 0.2 10*3/uL (ref 0.0–0.5)
Eosinophils Relative: 1 %
HCT: 41 % (ref 36.0–46.0)
Hemoglobin: 12.8 g/dL (ref 12.0–15.0)
Immature Granulocytes: 0 %
Lymphocytes Relative: 26 %
Lymphs Abs: 2.8 10*3/uL (ref 0.7–4.0)
MCH: 28.3 pg (ref 26.0–34.0)
MCHC: 31.2 g/dL (ref 30.0–36.0)
MCV: 90.5 fL (ref 80.0–100.0)
Monocytes Absolute: 0.6 10*3/uL (ref 0.1–1.0)
Monocytes Relative: 5 %
Neutro Abs: 7.4 10*3/uL (ref 1.7–7.7)
Neutrophils Relative %: 67 %
Platelets: 290 10*3/uL (ref 150–400)
RBC: 4.53 MIL/uL (ref 3.87–5.11)
RDW: 15.9 % — ABNORMAL HIGH (ref 11.5–15.5)
WBC: 11 10*3/uL — ABNORMAL HIGH (ref 4.0–10.5)
nRBC: 0 % (ref 0.0–0.2)

## 2021-06-04 LAB — IRON AND TIBC
Iron: 43 ug/dL (ref 28–170)
Saturation Ratios: 11 % (ref 10.4–31.8)
TIBC: 386 ug/dL (ref 250–450)
UIBC: 343 ug/dL

## 2021-06-04 LAB — FERRITIN: Ferritin: 38 ng/mL (ref 11–307)

## 2021-06-04 MED ORDER — SODIUM CHLORIDE 0.9% FLUSH
10.0000 mL | Freq: Once | INTRAVENOUS | Status: DC | PRN
  Filled 2021-06-04: qty 10

## 2021-06-04 MED ORDER — CYANOCOBALAMIN 1000 MCG/ML IJ SOLN
1000.0000 ug | INTRAMUSCULAR | Status: DC
  Administered 2021-06-04: 1000 ug via INTRAMUSCULAR
  Filled 2021-06-04: qty 1

## 2021-06-04 MED ORDER — HEPARIN SOD (PORK) LOCK FLUSH 100 UNIT/ML IV SOLN
250.0000 [IU] | Freq: Once | INTRAVENOUS | Status: DC | PRN
  Filled 2021-06-04: qty 5

## 2021-06-04 MED ORDER — HEPARIN SOD (PORK) LOCK FLUSH 100 UNIT/ML IV SOLN
500.0000 [IU] | Freq: Once | INTRAVENOUS | Status: DC | PRN
  Filled 2021-06-04: qty 5

## 2021-06-04 MED ORDER — CYANOCOBALAMIN 1000 MCG/ML IJ SOLN: 1000.0000 ug | Freq: Once | INTRAMUSCULAR | 0 refills | Status: DC

## 2021-06-04 MED ORDER — SODIUM CHLORIDE 0.9% FLUSH
3.0000 mL | Freq: Once | INTRAVENOUS | Status: DC | PRN
  Filled 2021-06-04: qty 3

## 2021-06-04 MED ORDER — ALTEPLASE 2 MG IJ SOLR
2.0000 mg | Freq: Once | INTRAMUSCULAR | Status: DC | PRN
  Filled 2021-06-04: qty 2

## 2021-06-04 NOTE — Progress Notes (Signed)
Pt has HA every once a while, and 2-3 diarrhea stools  over the last 3 months

## 2021-06-04 NOTE — Telephone Encounter (Signed)
Prescription refill request for Eliquis received. Indication:AFIB Last office visit:ARIDA 04/19/21 Scr:1.18 03/28/21 Age: 8F Weight:87.8KG

## 2021-06-04 NOTE — Progress Notes (Signed)
Hematology/Oncology Consult note [facility]  Telephone:([phone] [phone]  Patient Care Team: Steele Sizer, MD as PCP - General (Family Medicine) Wellington Hampshire, MD as PCP - Cardiology (Cardiology) Sindy Guadeloupe, MD as Consulting Physician (Oncology) Cammie Sickle, MD as Consulting Physician (Internal Medicine) Brendolyn Patty, MD (Dermatology) Sindy Guadeloupe, MD as Consulting Physician (Hematology and Oncology)   Name of the patient: Susan Bowen  [id number]  1932/02/21   Date of visit: 06/04/21  Diagnosis- 1. intra abdominal adenopathy- metastatic carcinoid on surveillance 2.  Iron and B12 deficiency anemia  Chief complaint/ Reason for visit-routine follow-up of carcinoid and anemia  Heme/Onc history: Patient is a 87 year old female who underwent CT renal stone study for flank pain.  CT scan showed pathologically enlarged lymph nodes in the right lower quadrant measuring up to 2.6 cm.  Findings concerning for lymphoma or malignant process.  Patient's past medical history significant for pulmonary hypertension, CKD, hypothyroidism among other medical problems.  She did not have any B symptoms.  This was followed by a PET CT scan Which showed 3 prominent soft tissue nodule/lymph nodes one in the mesentery at 2.1 cm with an SUV of 2.8.  Adjacent soft tissue nodule/lymph node in the right iliac fossa measuring 3.1 cm and SUV of 2.8.  Predominantly fat attenuation mesenteric nodule in the left lower quadrant measuring 2 cm without any significant FDG uptake   She remains on surveillance and has not started any somatostatin therapy yet    Interval history-patient continues to report improvement in her diarrhea and she has about 2 or 3 episodes in the morning.  She states that she has modified her diet which seems to be working.  She is doing well for her age otherwise and denies other complaints at this time  ECOG PS- 1 Pain scale-  0  Review of systems- Review of Systems  Constitutional:  Negative for chills, fever, malaise/fatigue and weight loss.  HENT:  Negative for congestion, ear discharge and nosebleeds.   Eyes:  Negative for blurred vision.  Respiratory:  Negative for cough, hemoptysis, sputum production, shortness of breath and wheezing.   Cardiovascular:  Negative for chest pain, palpitations, orthopnea and claudication.  Gastrointestinal:  Positive for diarrhea. Negative for abdominal pain, blood in stool, constipation, heartburn, melena, nausea and vomiting.  Genitourinary:  Negative for dysuria, flank pain, frequency, hematuria and urgency.  Musculoskeletal:  Negative for back pain, joint pain and myalgias.  Skin:  Negative for rash.  Neurological:  Negative for dizziness, tingling, focal weakness, seizures, weakness and headaches.  Endo/Heme/Allergies:  Does not bruise/bleed easily.  Psychiatric/Behavioral:  Negative for depression and suicidal ideas. The patient does not have insomnia.       Allergies  Allergen Reactions   Augmentin [Amoxicillin-Pot Clavulanate] Itching    Has patient had a PCN reaction causing immediate rash, facial/tongue/throat swelling, SOB or lightheadedness with hypotension: No Has patient had a PCN reaction causing severe rash involving mucus membranes or skin necrosis: No Has patient had a PCN reaction that required hospitalization: No Has patient had a PCN reaction occurring within the last 10 years: No If all of the above answers are "NO", then may proceed with Cephalosporin use.      Past Medical History:  Diagnosis Date   Allergy    Back spasm    Bunion    Carpal tunnel syndrome    Cataracta    Diabetes mellitus without complication (HCC)    Generalized  osteoarthritis    GERD (gastroesophageal reflux disease)    Gout    Hemorrhoids without complication    Hyperlipidemia    Hypertension    Hypothyroidism    Impingement syndrome of right shoulder    Insomnia     Lipoma    Lung nodule    Metastatic carcinoid tumor ([place]) 03/2016   Neuritis or radiculitis due to rupture of lumbar intervertebral disc    Obesity    Osteoporosis    PAF (paroxysmal atrial fibrillation) ([place])    a. 03/2018 AF w/ RVR-->converted spont.  CHA2DS2VASc = 7-->Eliquis.   Proteinuria    Rotator cuff tear    Systolic murmur    a. 02/2352 Echo: EF 60-65%, no rwma, Gr1 DD, mild MR, mildly dil LA. Nl RV fxn. PASP 7mHg.   TIA (transient ischemic attack)    TIA (transient ischemic attack) 1998   small TIA.  no residual effects   Tinnitus of both ears    Unspecified glaucoma(365.9)    Vitamin D deficiency    Wedge compression fracture of t11-T12 vertebra, sequela      Past Surgical History:  Procedure Laterality Date   ABDOMINAL HYSTERECTOMY  1986   APPENDECTOMY     BREAST SURGERY Bilateral 1983   biopsy of each side, negative   CARPAL TUNNEL RELEASE     CATARACT EXTRACTION W/PHACO Left 09/03/2017   Procedure: CATARACT EXTRACTION PHACO AND INTRAOCULAR LENS PLACEMENT (IOC)-LEFT DIABETIC;  Surgeon: PBirder Robson MD;  Location: [facility] ORS;  Service: Ophthalmology;  Laterality: Left;  [place]:10.2 AP% 16.7 CDE 11.71 Fluid Pack Lot # [id number]  CATARACT EXTRACTION W/PHACO Right 09/30/2017   Procedure: CATARACT EXTRACTION PHACO AND INTRAOCULAR LENS PLACEMENT (IOC);  Surgeon: PBirder Robson MD;  Location: [facility] ORS;  Service: Ophthalmology;  Laterality: Right;  [place]:35.1 AP% 13.1 CDE 12.44 Fluid pack Lot # [id number]   COLON SURGERY     EYE SURGERY Left 09/03/2017   Cataract extraction   FEMUR FRACTURE SURGERY Left    FRACTURE SURGERY  2001   left femur   HEMORROIDECTOMY     polyp removed from vocal cord      Social History   Socioeconomic History   Marital status: Divorced    Spouse name: Not on file   Number of children: 6   Years of education: Not on file   Highest education level: 10th grade  Occupational History   Not on file  Tobacco Use   Smoking  status: Never   Smokeless tobacco: Never   Tobacco comments:    Smoking cessation materials not required  Vaping Use   Vaping Use: Never used  Substance and Sexual Activity   Alcohol use: No    Alcohol/week: 0.0 standard drinks   Drug use: No   Sexual activity: Not Currently  Other Topics Concern   Not on file  Social History Narrative   Pt lives with her son; independent ADL's, no longer drives   Social Determinants of Health   Financial Resource Strain: Low Risk    Difficulty of Paying Living Expenses: Not very hard  Food Insecurity: No Food Insecurity   Worried About RCharity fundraiserin the Last Year: Never true   Ran Out of Food in the Last Year: Never true  Transportation Needs: No Transportation Needs   Lack of Transportation (Medical): No   Lack of Transportation (Non-Medical): No  Physical Activity: Insufficiently Active   Days of Exercise per Week: 7 days   Minutes of  Exercise per Session: 10 min  Stress: No Stress Concern Present   Feeling of Stress : Not at all  Social Connections: Moderately Integrated   Frequency of Communication with Friends and Family: More than three times a week   Frequency of Social Gatherings with Friends and Family: Three times a week   Attends Religious Services: More than 4 times per year   Active Member of Clubs or Organizations: Yes   Attends Archivist Meetings: 1 to 4 times per year   Marital Status: Divorced  Human resources officer Violence: Not At Risk   Fear of Current or Ex-Partner: No   Emotionally Abused: No   Physically Abused: No   Sexually Abused: No    Family History  Problem Relation Age of Onset   Diabetes Mother    Hypertension Mother    Dementia Mother    Hypertension Son      Current Outpatient Medications:    acetaminophen-codeine (TYLENOL #3) 300-30 MG tablet, TAKE 1 TABLET BY MOUTH EVERY 6 HOURS AS NEEDED FOR CHRONIC PAIN, Disp: 30 tablet, Rfl: 0   amLODipine (NORVASC) 10 MG tablet, Take 1  tablet (10 mg total) by mouth daily., Disp: 90 tablet, Rfl: 3   Blood Glucose Monitoring Suppl (ACCU-CHEK AVIVA PLUS) w/Device KIT, 1 kit by Does not apply route 3 (three) times daily., Disp: , Rfl:    carvedilol (COREG) 25 MG tablet, Take 1 tablet (25 mg total) by mouth 2 (two) times daily. New dose, Disp: 180 tablet, Rfl: 1   COMBIGAN 0.2-0.5 % ophthalmic solution, Place 1 drop into both eyes 2 (two) times daily., Disp: , Rfl:    Dulaglutide (TRULICITY) 3 IO/9.6EX SOPN, Inject 3 mg as directed once a week., Disp: 12 mL, Rfl: 1   ELIQUIS 5 MG TABS tablet, TAKE 1 TABLET BY MOUTH TWICE A DAY, Disp: 180 tablet, Rfl: 1   empagliflozin (JARDIANCE) 25 MG TABS tablet, Take 1 tablet (25 mg total) by mouth daily., Disp: 90 tablet, Rfl: 1   hydrOXYzine (ATARAX/VISTARIL) 10 MG tablet, TAKE 1 TABLET BY MOUTH EVERYDAY AT BEDTIME, Disp: 90 tablet, Rfl: 0   Lancets (ACCU-CHEK MULTICLIX) lancets, Use as instructed, Disp: 204 each, Rfl: 12   Lancets (ACCU-CHEK MULTICLIX) lancets, 1 each by Other route as needed for other. Use as instructed, Disp: , Rfl:    levothyroxine (SYNTHROID) 50 MCG tablet, Take 1 tablet (50 mcg total) by mouth daily before breakfast. And take two tablets twice a week, Disp: 114 tablet, Rfl: 1   loratadine (CLARITIN) 10 MG tablet, Take 1 tablet (10 mg total) by mouth daily., Disp: 90 tablet, Rfl: 1   metFORMIN (GLUCOPHAGE) 850 MG tablet, Take 1 tablet (850 mg total) by mouth 2 (two) times daily., Disp: 180 tablet, Rfl: 1   olmesartan (BENICAR) 40 MG tablet, Take 1 tablet (40 mg total) by mouth daily., Disp: 90 tablet, Rfl: 1   potassium chloride SA (KLOR-CON) 20 MEQ tablet, Take 1 tablet (20 mEq total) by mouth daily., Disp: 90 tablet, Rfl: 1   pravastatin (PRAVACHOL) 40 MG tablet, TAKE 1 TABLET BY MOUTH EVERY DAY, Disp: 90 tablet, Rfl: 2   tacrolimus (PROTOPIC) 0.1 % ointment, APPLY TWICE A DAY TO RASH UNDER BREAST AND IN GROIN FOR 2 WEEKS, THEN DECREASE TO EVERY DAY UNTIL CLEAR, THEN PRN  FLARES, Disp: 100 g, Rfl: 2   Travoprost, BAK Free, (TRAVATAN) 0.004 % SOLN ophthalmic solution, Place 1 drop into both eyes at bedtime., Disp: , Rfl:  Vitamin D, Ergocalciferol, (DRISDOL) 1.25 MG (50000 UNIT) CAPS capsule, TAKE 1 CAPSULE BY MOUTH WEEKLY, Disp: 12 capsule, Rfl: 1   allopurinol (ZYLOPRIM) 100 MG tablet, TAKE 1 TABLET BY MOUTH EVERY DAY, Disp: 90 tablet, Rfl: 1   cyanocobalamin (,VITAMIN B-12,) 1000 MCG/ML injection, Inject 1 mL (1,000 mcg total) into the muscle once for 1 dose., Disp: 1 mL, Rfl: 0   furosemide (LASIX) 20 MG tablet, Take 1 tablet (20 mg total) by mouth daily., Disp: 90 tablet, Rfl: 1   glucose blood (ACCU-CHEK AVIVA PLUS) test strip, Check fasting glucose once each morning, and check glucose as needed. (Patient not taking: Reported on 05/14/2021), Disp: 100 each, Rfl: 12   MITIGARE 0.6 MG CAPS, TAKE 1-2 CAPSULES (0.6-1.2 MG TOTAL) BY MOUTH DAILY AS NEEDED. FOR GOUT, NOT DAILY (Patient not taking: Reported on 06/04/2021), Disp: 30 capsule, Rfl: 0   nystatin (MYCOSTATIN/NYSTOP) powder, Apply 1 application topically 2 (two) times daily as needed. (Patient not taking: Reported on 06/04/2021), Disp: 30 g, Rfl: 0 No current facility-administered medications for this visit.  Facility-Administered Medications Ordered in Other Visits:    alteplase (CATHFLO ACTIVASE) injection 2 mg, 2 mg, Intracatheter, Once PRN, Sindy Guadeloupe, MD   cyanocobalamin ((VITAMIN B-12)) injection 1,000 mcg, 1,000 mcg, Intramuscular, Q30 days, Sindy Guadeloupe, MD, 1,000 mcg at 12/20/20 1414   cyanocobalamin ((VITAMIN B-12)) injection 1,000 mcg, 1,000 mcg, Intramuscular, Q30 days, Sindy Guadeloupe, MD, 1,000 mcg at 06/04/21 1435   heparin lock flush 100 unit/mL, 500 Units, Intracatheter, Once PRN, Sindy Guadeloupe, MD   heparin lock flush 100 unit/mL, 250 Units, Intracatheter, Once PRN, Sindy Guadeloupe, MD   sodium chloride flush (NS) 0.9 % injection 10 mL, 10 mL, Intracatheter, Once PRN, Sindy Guadeloupe, MD    sodium chloride flush (NS) 0.9 % injection 3 mL, 3 mL, Intracatheter, Once PRN, Sindy Guadeloupe, MD  Physical exam:  Vitals:   06/04/21 1335  BP: 126/65  Pulse: 73  Resp: 16  Temp: 98.1 F (36.7 C)  TempSrc: Tympanic  Weight: 191 lb 14.4 oz (87 kg)  Height: _0  (1.651 m)   Physical Exam Cardiovascular:     Rate and Rhythm: Normal rate and regular rhythm.     Heart sounds: Normal heart sounds.  Pulmonary:     Effort: Pulmonary effort is normal.     Breath sounds: Normal breath sounds.  Abdominal:     General: Bowel sounds are normal.     Palpations: Abdomen is soft.  Skin:    General: Skin is warm and dry.  Neurological:     Mental Status: She is alert and oriented to person, place, and time.     CMP Latest Ref Rng & Units 03/28/2021  Glucose 70 - 99 mg/dL 135(H)  BUN 8 - 23 mg/dL 14  Creatinine 0.44 - 1.00 mg/dL 1.18(H)  Sodium 135 - 145 mmol/L 138  Potassium 3.5 - 5.1 mmol/L 4.1  Chloride 98 - 111 mmol/L 102  CO2 22 - 32 mmol/L 25  Calcium 8.9 - 10.3 mg/dL 9.5  Total Protein 6.5 - 8.1 g/dL 7.1  Total Bilirubin 0.3 - 1.2 mg/dL 0.6  Alkaline Phos 38 - 126 U/L 55  AST 15 - 41 U/L 16  ALT 0 - 44 U/L 11   CBC Latest Ref Rng & Units 06/04/2021  WBC 4.0 - 10.5 K/uL 11.0(H)  Hemoglobin 12.0 - 15.0 g/dL 12.8  Hematocrit 36.0 - 46.0 % 41.0  Platelets 150 - 400  K/uL 290     Assessment and plan- Patient is a 87 y.o. female who is here for follow-up of following issues:  Metastatic small bowel carcinoid: Dotatate PET scan in May 2021 did not show any significant worsening of her soft tissue nodules in the right lower quadrant mesentery.  She has not started any octreotide yet and my plan is to continue monitoring this with a repeat scan in November 2022 especially since her diarrhea has improved and there is no clear reason to start octreotide at this time History of B12 deficiency anemia: She will receive her B12 shot today.  Overall her hemoglobin is normal today at 12.8.   Iron saturation is low at 11% but ferritin levels normal at 38.  We will hold off on giving her IV iron at this time   Visit Diagnosis 1. Malignant carcinoid tumor of small intestine, unspecified location [facility])      Dr. Randa Evens, MD, MPH [facility] [phone] 06/04/2021 4:55 PM

## 2021-06-04 NOTE — Telephone Encounter (Signed)
Please advise 

## 2021-06-08 ENCOUNTER — Other Ambulatory Visit: Payer: Self-pay

## 2021-06-08 ENCOUNTER — Ambulatory Visit (INDEPENDENT_AMBULATORY_CARE_PROVIDER_SITE_OTHER): Payer: Medicare HMO

## 2021-06-08 DIAGNOSIS — R011 Cardiac murmur, unspecified: Secondary | ICD-10-CM | POA: Diagnosis not present

## 2021-06-08 LAB — ECHOCARDIOGRAM COMPLETE
AR max vel: 1.73 cm2
AV Area VTI: 2.04 cm2
AV Area mean vel: 1.96 cm2
AV Mean grad: 6 mmHg
AV Peak grad: 13.5 mmHg
Ao pk vel: 1.84 m/s
Area-P 1/2: 3.26 cm2
Calc EF: 57.7 %
S' Lateral: 2.5 cm
Single Plane A2C EF: 54 %
Single Plane A4C EF: 60.7 %

## 2021-06-13 ENCOUNTER — Telehealth: Payer: Self-pay

## 2021-06-13 ENCOUNTER — Other Ambulatory Visit: Payer: Self-pay | Admitting: Family Medicine

## 2021-06-13 ENCOUNTER — Ambulatory Visit: Payer: Medicare HMO

## 2021-06-13 DIAGNOSIS — R600 Localized edema: Secondary | ICD-10-CM

## 2021-06-13 DIAGNOSIS — M545 Low back pain, unspecified: Secondary | ICD-10-CM

## 2021-06-13 NOTE — Telephone Encounter (Signed)
-----   Message from Wellington Hampshire, MD sent at 06/13/2021  4:25 PM EDT ----- Inform patient that echo showed normal ejection fraction but there was evidence of moderate to severe pulmonary hypertension.  This might be responsible for  increased edema.  Recommend increasing furosemide to 40 mg once daily and check basic metabolic profile in 1 week.

## 2021-06-13 NOTE — Telephone Encounter (Signed)
Last seen 7.11.2022 upcoming 11.15.2022

## 2021-06-13 NOTE — Telephone Encounter (Signed)
Called to give the patient echo results. Lmtcb. 

## 2021-06-13 NOTE — Telephone Encounter (Signed)
Requested medication (s) are due for refill today: no  Requested medication (s) are on the active medication list: yes  Last refill:  01/10/2021  Future visit scheduled: yes   Notes to clinic:   This refill cannot be delegated  Requested Prescriptions  Pending Prescriptions Disp Refills   Acetaminophen-Codeine 300-30 MG tablet [Pharmacy Med Name: ACETAMINOPHEN-COD #3 TABLET] 30 tablet 0    Sig: TAKE 1 TABLET BY MOUTH EVERY 6 HOURS AS NEEDED FOR CHRONIC PAIN      Not Delegated - Analgesics:  Opioid Agonist Combinations Failed - 06/13/2021  9:22 AM      Failed - This refill cannot be delegated      Failed - Urine Drug Screen completed in last 360 days      Passed - Valid encounter within last 6 months    Recent Outpatient Visits           1 month ago Diabetes mellitus with proteinuric diabetic nephropathy [facility])   [facility] Steele Sizer, MD   2 months ago [facility] Steele Sizer, MD   5 months ago Stage 3a chronic kidney disease [facility])   [facility] Steele Sizer, MD   8 months ago [facility] Steele Sizer, MD   8 months ago Pruritic intertrigo   [facility] Delsa Grana, PA-C       Future Appointments             In 3 months Ancil Boozer, Drue Stager, MD [facility], [place]   In 11 months  [facility], [facility]

## 2021-06-18 ENCOUNTER — Other Ambulatory Visit: Payer: Self-pay

## 2021-06-18 DIAGNOSIS — I272 Pulmonary hypertension, unspecified: Secondary | ICD-10-CM

## 2021-06-18 NOTE — Telephone Encounter (Signed)
-----   Message from Wellington Hampshire, MD sent at 06/13/2021  4:25 PM EDT ----- Inform patient that echo showed normal ejection fraction but there was evidence of moderate to severe pulmonary hypertension.  This might be responsible for  increased edema.  Recommend increasing furosemide to 40 mg once daily and check basic metabolic profile in 1 week.

## 2021-06-18 NOTE — Telephone Encounter (Signed)
2nd attempt to contact the patient. Lmtcb.

## 2021-06-19 MED ORDER — FUROSEMIDE 20 MG PO TABS
40.0000 mg | ORAL_TABLET | Freq: Every day | ORAL | 1 refills | Status: DC
Start: 2021-06-19 — End: 2021-10-22

## 2021-06-19 NOTE — Telephone Encounter (Signed)
-----   Message from Wellington Hampshire, MD sent at 06/13/2021  4:25 PM EDT ----- Inform patient that echo showed normal ejection fraction but there was evidence of moderate to severe pulmonary hypertension.  This might be responsible for  increased edema.  Recommend increasing furosemide to 40 mg once daily and check basic metabolic profile in 1 week.

## 2021-06-19 NOTE — Telephone Encounter (Signed)
Patient made aware of echo results and Dr. Tyrell Antonio recommendation. Patient is agreeable with the plan. She has recently refilled her lasix and will take (2) 20 mg tabs daily. Order placed for 1 wk bmp [facility].  Patient verbalized understanding to the instructions given and voiced appreciation for the assistance.

## 2021-06-28 ENCOUNTER — Other Ambulatory Visit
Admission: RE | Admit: 2021-06-28 | Discharge: 2021-06-28 | Disposition: A | Discharge status: Home / Self Care | Payer: Medicare HMO | Source: Ambulatory Visit | Attending: Cardiovascular Disease | Admitting: Cardiovascular Disease

## 2021-06-28 DIAGNOSIS — R600 Localized edema: Secondary | ICD-10-CM | POA: Insufficient documentation

## 2021-06-28 LAB — BASIC METABOLIC PANEL
Anion gap: 8 (ref 5–15)
BUN: 14 mg/dL (ref 8–23)
CO2: 27 mmol/L (ref 22–32)
Calcium: 9.8 mg/dL (ref 8.9–10.3)
Chloride: 102 mmol/L (ref 98–111)
Creatinine, Ser: 1.14 mg/dL — ABNORMAL HIGH (ref 0.44–1.00)
GFR, Estimated: 47 mL/min — ABNORMAL LOW (ref >60)
Glucose, Bld: 171 mg/dL — ABNORMAL HIGH (ref 70–99)
Potassium: 4 mmol/L (ref 3.5–5.1)
Sodium: 137 mmol/L (ref 135–145)

## 2021-07-06 ENCOUNTER — Other Ambulatory Visit: Payer: Self-pay | Admitting: Oncology

## 2021-07-10 ENCOUNTER — Other Ambulatory Visit: Payer: Self-pay | Admitting: Family Medicine

## 2021-07-10 DIAGNOSIS — E890 Postprocedural hypothyroidism: Secondary | ICD-10-CM

## 2021-07-20 ENCOUNTER — Other Ambulatory Visit: Payer: Self-pay | Admitting: Family Medicine

## 2021-07-20 DIAGNOSIS — E1121 Type 2 diabetes mellitus with diabetic nephropathy: Secondary | ICD-10-CM

## 2021-07-20 DIAGNOSIS — E785 Hyperlipidemia, unspecified: Secondary | ICD-10-CM

## 2021-07-20 DIAGNOSIS — E1169 Type 2 diabetes mellitus with other specified complication: Secondary | ICD-10-CM

## 2021-07-20 MED ORDER — TRULICITY 3 MG/0.5ML ~~LOC~~ SOAJ: 3.0000 mg | SUBCUTANEOUS | 0 refills | Status: DC

## 2021-07-20 NOTE — Telephone Encounter (Signed)
Medication Refill - Medication:  Dulaglutide (TRULICITY) 3 0000000 SOPN  Has the patient contacted their pharmacy? Yes.   Pharmacy did not see medication  Preferred Pharmacy (with phone number or street name):  [id number] [place] Phone:  [phone]:  [phone] Has the patient been seen for an appointment in the last year OR does the patient have an upcoming appointment? Yes.    Agent: Please be advised that RX refills may take up to 3 business days. We ask that you follow-up with your pharmacy.

## 2021-09-12 ENCOUNTER — Ambulatory Visit
Admission: RE | Admit: 2021-09-12 | Discharge: 2021-09-12 | Disposition: A | Discharge status: Home / Self Care | Payer: Medicare HMO | Source: Ambulatory Visit | Attending: Oncology | Admitting: Oncology

## 2021-09-12 DIAGNOSIS — C7B00 Secondary carcinoid tumors, unspecified site: Secondary | ICD-10-CM

## 2021-09-12 DIAGNOSIS — D508 Other iron deficiency anemias: Secondary | ICD-10-CM

## 2021-09-13 DIAGNOSIS — Z0100 Encounter for examination of eyes and vision without abnormal findings: Secondary | ICD-10-CM | POA: Diagnosis not present

## 2021-09-13 DIAGNOSIS — M3501 Sicca syndrome with keratoconjunctivitis: Secondary | ICD-10-CM | POA: Diagnosis not present

## 2021-09-13 DIAGNOSIS — H401132 Primary open-angle glaucoma, bilateral, moderate stage: Secondary | ICD-10-CM | POA: Diagnosis not present

## 2021-09-14 ENCOUNTER — Inpatient Hospital Stay: Payer: Medicare HMO | Admitting: Oncology

## 2021-09-14 ENCOUNTER — Inpatient Hospital Stay: Payer: Medicare HMO

## 2021-09-17 ENCOUNTER — Other Ambulatory Visit: Payer: Self-pay | Admitting: Family Medicine

## 2021-09-17 DIAGNOSIS — E785 Hyperlipidemia, unspecified: Secondary | ICD-10-CM

## 2021-09-17 DIAGNOSIS — E1121 Type 2 diabetes mellitus with diabetic nephropathy: Secondary | ICD-10-CM

## 2021-09-17 DIAGNOSIS — E1169 Type 2 diabetes mellitus with other specified complication: Secondary | ICD-10-CM

## 2021-09-17 NOTE — Progress Notes (Signed)
Name: Susan Bowen   MRN: [id number]    DOB: 27-Jan-1932   Date:09/18/2021       Progress Note  Subjective  Chief Complaint  Follow Up  HPI  Thyroid mass: US done in 10/2020 showed, history of thyroid ablation, taking levothyroxine 50 mcg and twice a week she takes two tablets ( Tuesday and Fridays) . Last TSH was normal - last level checked 07/22 and it was 1.62   IMPRESSION: 1. Atrophic and moderately heterogeneous thyroid without worrisome new or enlarging thyroid nodules 2. Dominant right-sided thyroid nodule has decreased in size compared to the 2015 examination. Imaging stability for greater than 5 years is indicative of benign etiology. 3. None of the other discretely measured nodules/pseudo nodules meet imaging criteria to recommend percutaneous sampling or continued dedicated follow-up.   DMII with microalbuminuria and CKI stage III :  she is taking medications, ARB and statin therapy. She has not been checking her glucose lately   She denies polyphagia, polydipsia or  polyuria.  Her hgbA1C was 10.5%, dropped to 8.5% 7.8%, 7.5% , up to 7.8%  ,8 %  ,  7.8 % , 6.3 % stable at  6.7 % for the past 2 visits.  She has been taking Trulicity and , Metformin, Jardiance, denies side effects of medication. .She states there is a Event organiser of Trulicity and unable to fill it 6 days ago. She states her glucose still under control at home, but spiked to 160 a couple of days ago    HTN: she has been taking medication. No chest pain, but occasionally has heart flutter. . She is on Norvasc, Carvedilol and Benicar given by cardiologist. BP is at [facility] and not side effects of medications at this time    Afib : under the care of Dr. Fletcher Anon, rate controlled, she has sob with activity but very seldom, she still has some palpitation intermittent that happens usually at night but lasts only a few seconds., she has some lower extremity edema, likely from norvasc. She also has pulmonary hypertension and last  Echo showed On Eliquis , denies any bleeding.   IMPRESSIONS Echo done on 08/22    1. Left ventricular ejection fraction, by estimation, is 60 to 65%. The  left ventricle has normal function. The left ventricle has no regional  wall motion abnormalities. There is moderate left ventricular hypertrophy.  Left ventricular diastolic  parameters are consistent with Grade I diastolic dysfunction (impaired  relaxation).   2. Right ventricular systolic function is normal. The right ventricular  size is normal. There is severely elevated pulmonary artery systolic  pressure. The estimated right ventricular systolic pressure is 56.3 mmHg.    Hyperlipidemia: taking Pravastatin and denies side effects of medications, no myalgia. Continue medications    Pulmonary nodule: seen by Dr. Raul Del, lung nodule stable and finished CT in 2017 .She is no longer seeing him. She is now seeing Dr. Janese Banks and had a CT chest done 08/2019 Pulmonary nodules unchanged since 2017, released from him . Unchanged    Aorta atherosclerosis on CT chest: on statin and Eliquis. Stable    Carcinoid tumor with metastasis: incidental finding on CT abdomen done for evaluation of renal stone back in 03/26/2019, it showed retroperitoneal lymphadenopathy, she has since seen Dr. 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 She is not on therapy just watchful waiting. Last visit was in August 22 . since she has been asymptomatic. Unchanged  Malnutrition; weight went down from 240 lbs to 193 lbs in about 4 months, but weight is finally stable, today it was 190 lbs . She states diarrhea is sporadic now at most a couple times a month  She is taking protein shakes   Anemia secondary to iron  and B12 deficiency: under the care of Dr. Janese Banks , getting B12 injections , last B12 was down to 171 back in June but already has orders to have it repeated by Dr. Janese Banks   Intermittent low back pain:  cannot take NSAID's takes tylenol with codeine prn for pain and needs a refill. 30 pills lasting a couple of months. States triggered by activity, pain is int he lower back and no radiculitis   Intertrigo: needs a refill of ketoconazole, she only uses it prn , usually on her groin  Patient Active Problem List   Diagnosis Date Noted   Iron deficiency anemia 12/04/2020   Carcinoid tumor, malignant ([place]) 09/08/2019   Pain in joint of left shoulder 02/25/2019   Pulmonary hypertension, unspecified ([place]) 12/28/2018   Paroxysmal A-fib ([place]) 12/28/2018   Chronic kidney disease, stage III (moderate) (HCC) 04/01/2018   Elevated parathyroid hormone 01/01/2018   Elevated uric acid in blood 04/29/2017   Plantar fasciitis of left foot 03/12/2016   Postprocedural hypothyroidism 01/23/2016   Aortic stenosis 09/26/2015   Allergic rhinitis, seasonal 08/08/2015   Benign hypertension 08/08/2015   History of pneumonia 08/08/2015   Carpal tunnel syndrome 08/08/2015   Cataract 08/08/2015   Insomnia, persistent 08/08/2015   Dyslipidemia 08/08/2015   History of depression 08/08/2015   Glaucoma 08/08/2015   Controlled gout 08/08/2015   Fever blister 08/08/2015   Bilateral hearing loss 08/08/2015   Hemorrhoid 08/08/2015   H/O iron deficiency anemia 08/08/2015   Benign neoplasm of colon 08/08/2015   Impingement syndrome of shoulder 08/08/2015   Osteoporosis, post-menopausal 08/08/2015   Generalized OA 08/08/2015   Multinodular goiter 08/08/2015   Asymptomatic varicose veins 08/08/2015   Polyp of vocal cord 08/08/2015   History of vertebral compression fracture 08/08/2015   Diabetes mellitus with proteinuric diabetic nephropathy ([place]) 08/08/2015   LVH (left ventricular hypertrophy) 08/08/2015   Moderate tricuspid regurgitation 08/08/2015   History of radioactive iodine thyroid ablation 08/08/2015   Lung nodule, solitary 05/12/2014   Chronic cough 05/11/2014   Vitamin D deficiency 12/20/2009    Plantar fascial fibromatosis 12/20/2009   Personal history of fall 07/20/2008   Cervical radiculitis 05/11/2008    Past Surgical History:  Procedure Laterality Date   ABDOMINAL HYSTERECTOMY  1986   APPENDECTOMY     BREAST SURGERY Bilateral 1983   biopsy of each side, negative   CARPAL TUNNEL RELEASE     CATARACT EXTRACTION W/PHACO Left 09/03/2017   Procedure: CATARACT EXTRACTION PHACO AND INTRAOCULAR LENS PLACEMENT (IOC)-LEFT DIABETIC;  Surgeon: Birder Robson, MD;  Location: [facility] ORS;  Service: Ophthalmology;  Laterality: Left;  [place] 01:10.2 AP% 16.7 CDE 11.71 Fluid Pack Lot # [id number]   CATARACT EXTRACTION W/PHACO Right 09/30/2017   Procedure: CATARACT EXTRACTION PHACO AND INTRAOCULAR LENS PLACEMENT (IOC);  Surgeon: Birder Robson, MD;  Location: [facility] ORS;  Service: Ophthalmology;  Laterality: Right;  [place] 01:35.1 AP% 13.1 CDE 12.44 Fluid pack Lot # [id number]   COLON SURGERY     EYE SURGERY Left 09/03/2017   Cataract extraction   FEMUR FRACTURE SURGERY Left    FRACTURE SURGERY  2001   left femur   HEMORROIDECTOMY     polyp removed from vocal cord  Family History  Problem Relation Age of Onset   Diabetes Mother    Hypertension Mother    Dementia Mother    Hypertension Son     Social History   Tobacco Use   Smoking status: Never   Smokeless tobacco: Never   Tobacco comments:    Smoking cessation materials not required  Substance Use Topics   Alcohol use: No    Alcohol/week: 0.0 standard drinks     Current Outpatient Medications:    Acetaminophen-Codeine 300-30 MG tablet, TAKE 1 TABLET BY MOUTH EVERY 6 HOURS AS NEEDED FOR CHRONIC PAIN, Disp: 30 tablet, Rfl: 0   allopurinol (ZYLOPRIM) 100 MG tablet, TAKE 1 TABLET BY MOUTH EVERY DAY, Disp: 90 tablet, Rfl: 1   amLODipine (NORVASC) 10 MG tablet, Take 1 tablet (10 mg total) by mouth daily., Disp: 90 tablet, Rfl: 3   Blood Glucose Monitoring Suppl (ACCU-CHEK AVIVA PLUS) w/Device KIT, 1 kit by Does not apply route  3 (three) times daily., Disp: , Rfl:    carvedilol (COREG) 25 MG tablet, Take 1 tablet (25 mg total) by mouth 2 (two) times daily. New dose, Disp: 180 tablet, Rfl: 1   COMBIGAN 0.2-0.5 % ophthalmic solution, Place 1 drop into both eyes 2 (two) times daily., Disp: , Rfl:    Dulaglutide (TRULICITY) 3 DD/2.2GU SOPN, Inject 3 mg as directed once a week., Disp: 12 mL, Rfl: 0   ELIQUIS 5 MG TABS tablet, TAKE 1 TABLET BY MOUTH TWICE A DAY, Disp: 180 tablet, Rfl: 1   empagliflozin (JARDIANCE) 25 MG TABS tablet, Take 1 tablet (25 mg total) by mouth daily., Disp: 90 tablet, Rfl: 1   furosemide (LASIX) 20 MG tablet, Take 2 tablets (40 mg total) by mouth daily., Disp: 90 tablet, Rfl: 1   glucose blood (ACCU-CHEK AVIVA PLUS) test strip, Check fasting glucose once each morning, and check glucose as needed., Disp: 100 each, Rfl: 12   Lancets (ACCU-CHEK MULTICLIX) lancets, Use as instructed, Disp: 204 each, Rfl: 12   Lancets (ACCU-CHEK MULTICLIX) lancets, 1 each by Other route as needed for other. Use as instructed, Disp: , Rfl:    levothyroxine (SYNTHROID) 50 MCG tablet, TAKE 1 TABLET (50 MCG TOTAL) BY MOUTH DAILY BEFORE BREAKFAST. AND TAKE TWO TABLETS TWICE A WEEK, Disp: 114 tablet, Rfl: 0   loratadine (CLARITIN) 10 MG tablet, Take 1 tablet (10 mg total) by mouth daily., Disp: 90 tablet, Rfl: 1   metFORMIN (GLUCOPHAGE) 850 MG tablet, Take 1 tablet (850 mg total) by mouth 2 (two) times daily., Disp: 180 tablet, Rfl: 1   MITIGARE 0.6 MG CAPS, TAKE 1-2 CAPSULES (0.6-1.2 MG TOTAL) BY MOUTH DAILY AS NEEDED. FOR GOUT, NOT DAILY, Disp: 30 capsule, Rfl: 0   olmesartan (BENICAR) 40 MG tablet, Take 1 tablet (40 mg total) by mouth daily., Disp: 90 tablet, Rfl: 1   potassium chloride SA (KLOR-CON) 20 MEQ tablet, Take 1 tablet (20 mEq total) by mouth daily., Disp: 90 tablet, Rfl: 1   pravastatin (PRAVACHOL) 40 MG tablet, TAKE 1 TABLET BY MOUTH EVERY DAY, Disp: 90 tablet, Rfl: 2   tacrolimus (PROTOPIC) 0.1 % ointment, APPLY  TWICE A DAY TO RASH UNDER BREAST AND IN GROIN FOR 2 WEEKS, THEN DECREASE TO EVERY DAY UNTIL CLEAR, THEN PRN FLARES, Disp: 100 g, Rfl: 2   Travoprost, BAK Free, (TRAVATAN) 0.004 % SOLN ophthalmic solution, Place 1 drop into both eyes at bedtime., Disp: , Rfl:    Vitamin D, Ergocalciferol, (DRISDOL) 1.25 MG (50000 UNIT) CAPS  capsule, TAKE 1 CAPSULE BY MOUTH WEEKLY, Disp: 12 capsule, Rfl: 1   clotrimazole-betamethasone (LOTRISONE) cream, APPLY 1 APPLICATION TOPICALLY 2 (TWO) TIMES DAILY AS NEEDED. TO AFFECTED SKIN (Patient not taking: Reported on 09/18/2021), Disp: 45 g, Rfl: 0   hydrOXYzine (ATARAX/VISTARIL) 10 MG tablet, TAKE 1 TABLET BY MOUTH EVERYDAY AT BEDTIME (Patient not taking: Reported on 09/18/2021), Disp: 90 tablet, Rfl: 0   nystatin (MYCOSTATIN/NYSTOP) powder, Apply 1 application topically 2 (two) times daily as needed. (Patient not taking: Reported on 09/18/2021), Disp: 30 g, Rfl: 0 No current facility-administered medications for this visit.  Facility-Administered Medications Ordered in Other Visits:    cyanocobalamin ((VITAMIN B-12)) injection 1,000 mcg, 1,000 mcg, Intramuscular, Q30 days, Sindy Guadeloupe, MD, 1,000 mcg at 12/20/20 1414  Allergies  Allergen Reactions   Augmentin [Amoxicillin-Pot Clavulanate] Itching    Has patient had a PCN reaction causing immediate rash, facial/tongue/throat swelling, SOB or lightheadedness with hypotension: No Has patient had a PCN reaction causing severe rash involving mucus membranes or skin necrosis: No Has patient had a PCN reaction that required hospitalization: No Has patient had a PCN reaction occurring within the last 10 years: No If all of the above answers are "NO", then may proceed with Cephalosporin use.     I personally reviewed active problem list, medication list, allergies, family history, social history, health maintenance with the patient/caregiver today.   ROS  Constitutional: Negative for fever or weight change.   Respiratory: Negative for cough and shortness of breath.   Cardiovascular: Negative for chest pain or palpitations.  Gastrointestinal: Negative for abdominal pain, no bowel changes.  Musculoskeletal: Negative for gait problem or joint swelling.  Skin: Negative for rash.  Neurological: Negative for dizziness or headache.  No other specific complaints in a complete review of systems (except as listed in HPI above).   Objective  Vitals:   09/18/21 1043  BP: 130/68  Pulse: 88  Resp: 16  Temp: 97.8 F (36.6 C)  SpO2: 97%  Weight: 190 lb (86.2 kg)  Height: 5' 5" (1.651 m)    Body mass index is 31.62 kg/m.  Physical Exam  Constitutional: Patient appears well-developed and malnourished/ temporal waisting  No distress.  HEENT: head atraumatic, normocephalic, pupils equal and reactive to light, neck supple, throat within normal limits Cardiovascular: Normal rate, regular rhythm and normal heart sounds.  No murmur heard. Trace  BLE edema. Pulmonary/Chest: Effort normal and breath sounds normal. No respiratory distress. Abdominal: Soft.  There is no tenderness. Psychiatric: Patient has a normal mood and affect. behavior is normal. Judgment and thought content normal.   Recent Results (from the past 2160 hour(s))  Basic metabolic panel     Status: Abnormal   Collection Time: 06/28/21 10:30 AM  Result Value Ref Range   Sodium 137 135 - 145 mmol/L   Potassium 4.0 3.5 - 5.1 mmol/L   Chloride 102 98 - 111 mmol/L   CO2 27 22 - 32 mmol/L   Glucose, Bld 171 (H) 70 - 99 mg/dL    Comment: Glucose reference range applies only to samples taken after fasting for at least 8 hours.   BUN 14 8 - 23 mg/dL   Creatinine, Ser 1.14 (H) 0.44 - 1.00 mg/dL   Calcium 9.8 8.9 - 10.3 mg/dL   GFR, Estimated 47 (L) >60 mL/min    Comment: (NOTE) Calculated using the CKD-EPI Creatinine Equation (2021)    Anion gap 8 5 - 15    Comment: Performed at [facility],  [place],  [place] [address]    PHQ2/9: Depression screen [facility] 2/9 09/18/2021 05/14/2021 05/10/2021 03/22/2021 01/10/2021  Decreased Interest 0 0 0 0 0  Down, Depressed, Hopeless 0 0 0 0 0  PHQ - 2 Score 0 0 0 0 0  Altered sleeping 0 - - - -  Tired, decreased energy 0 - - - -  Change in appetite 0 - - - -  Feeling bad or failure about yourself  0 - - - -  Trouble concentrating 0 - - - -  Moving slowly or fidgety/restless 0 - - - -  Suicidal thoughts 0 - - - -  PHQ-9 Score 0 - - - -  Difficult doing work/chores - - - - -  Some recent data might be hidden    phq 9 is negative   Fall Risk: Fall Risk  09/18/2021 05/14/2021 05/10/2021 03/22/2021 01/10/2021  Falls in the past year? 0 - 0 0 0  Number falls in past yr: 0 0 0 0 0  Injury with Fall? 0 0 0 0 0  Risk for fall due to : No Fall Risks - No Fall Risks - -  Follow up Falls prevention discussed Falls evaluation completed Falls prevention discussed - -      Functional Status Survey: Is the patient deaf or have difficulty hearing?: Yes Does the patient have difficulty seeing, even when wearing glasses/contacts?: Yes Does the patient have difficulty concentrating, remembering, or making decisions?: No Does the patient have difficulty walking or climbing stairs?: No Does the patient have difficulty dressing or bathing?: No Does the patient have difficulty doing errands alone such as visiting a doctor's office or shopping?: No    Assessment & Plan  1. Diabetes mellitus with proteinuric diabetic nephropathy (HCC)  - POCT HgB A1C - metFORMIN (GLUCOPHAGE) 850 MG tablet; Take 1 tablet (850 mg total) by mouth 2 (two) times daily.  Dispense: 180 tablet; Refill: 1 - empagliflozin (JARDIANCE) 25 MG TABS tablet; Take 1 tablet (25 mg total) by mouth daily.  Dispense: 90 tablet; Refill: 1 - Dulaglutide (TRULICITY) 3 SA/6.3KZ SOPN; Inject 3 mg as directed once a week.  Dispense: 12 mL; Refill: 0  2. Stage 3a chronic kidney disease (HCC)  - amLODipine (NORVASC) 10  MG tablet; Take 1 tablet (10 mg total) by mouth daily.  Dispense: 90 tablet; Refill: 1  3. Malignant carcinoid tumor of small intestine, unspecified location [facility])  Under the care of Dr. Janese Banks   4. Pulmonary hypertension, unspecified (HCC)   5. Paroxysmal A-fib (HCC)   6. 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 Janese Banks   7. CHF (congestive heart failure), NYHA class I, chronic, diastolic (HCC)   8. Vitamin D deficiency   9. Need for immunization against influenza  - Flu Vaccine QUAD High Dose(Fluad)  10. Dyslipidemia associated with type 2 diabetes mellitus (HCC)  - metFORMIN (GLUCOPHAGE) 850 MG tablet; Take 1 tablet (850 mg total) by mouth 2 (two) times daily.  Dispense: 180 tablet; Refill: 1 - empagliflozin (JARDIANCE) 25 MG TABS tablet; Take 1 tablet (25 mg total) by mouth daily.  Dispense: 90 tablet; Refill: 1 - Dulaglutide (TRULICITY) 3 SW/1.0XN SOPN; Inject 3 mg as directed once a week.  Dispense: 12 mL; Refill: 0  11. Moderate protein-calorie malnutrition ([place])  Continue high protein diet   12. Hypothyroidism, postablative  - levothyroxine (SYNTHROID) 50 MCG tablet; Take 1 tablet (50 mcg total) by mouth daily before  breakfast. And take two tablets twice a week  Dispense: 114 tablet; Refill: 1  13. B12 deficiency   14. Atherosclerosis of aorta ([place])  On statin therapy   15. Benign hypertension  - olmesartan (BENICAR) 40 MG tablet; Take 1 tablet (40 mg total) by mouth daily.  Dispense: 90 tablet; Refill: 1 - carvedilol (COREG) 25 MG tablet; Take 1 tablet (25 mg total) by mouth 2 (two) times daily. New dose  Dispense: 180 tablet; Refill: 1 - amLODipine (NORVASC) 10 MG tablet; Take 1 tablet (10 mg total) by mouth daily.  Dispense: 90 tablet; Refill: 1  16. Dyslipidemia  - pravastatin (PRAVACHOL) 40 MG tablet; Take 1 tablet (40 mg total) by mouth daily.  Dispense: 90 tablet; Refill: 1  17. Edema of left lower extremity  - potassium  chloride SA (KLOR-CON) 20 MEQ tablet; Take 1 tablet (20 mEq total) by mouth daily.  Dispense: 90 tablet; Refill: 1  18. Intermittent low back pain  - Acetaminophen-Codeine 300-30 MG tablet; Take 1 tablet by mouth every 6 (six) hours as needed for up to 7 days for pain. TAKE 1 TABLET BY MOUTH EVERY 6 HOURS AS NEEDED FOR CHRONIC PAIN  Dispense: 30 tablet; Refill: 0  19. Intertrigo  - ketoconazole (NIZORAL) 2 % cream; Apply 1 application topically daily.  Dispense: 60 g; Refill: 0

## 2021-09-17 NOTE — Telephone Encounter (Signed)
Medication Refill - Medication: Trulicity   Has the patient contacted their pharmacy? Yes.   Pt calling stating that she contacted the pharmacy and that they have this medication on back order. Requesting to look at a new medication possibly since she has been out of medication x5 days. Please advise.  (Agent: If no, request that the patient contact the pharmacy for the refill. If patient does not wish to contact the pharmacy document the reason why and proceed with request.) (Agent: If yes, when and what did the pharmacy advise?)  Preferred Pharmacy (with phone number or street name):  [place], [place] - [address]  [address] [place] [address]  Phone: [phone] Fax: [phone]  Hours: Not open 24 hours   Has the patient been seen for an appointment in the last year OR does the patient have an upcoming appointment? Yes.   09/18/21  Agent: Please be advised that RX refills may take up to 3 business days. We ask that you follow-up with your pharmacy.

## 2021-09-17 NOTE — Telephone Encounter (Signed)
Requested medication (s) are due for refill today: yes  Requested medication (s) are on the active medication list: yes   Last refill: 07/20/21 30ml  0 refills  Future visit scheduled yes  tomorrow 09/18/21  Notes to clinic:  Please see note. Pt requesting alternate med.  Requested Prescriptions  Pending Prescriptions Disp Refills   Dulaglutide (TRULICITY) 3 UX/3.2TF SOPN 12 mL 0    Sig: Inject 3 mg as directed once a week.     Endocrinology:  Diabetes - GLP-1 Receptor Agonists Passed - 09/17/2021  3:05 PM      Passed - HBA1C is between 0 and 7.9 and within 180 days    Hemoglobin A1C  Date Value Ref Range Status  05/14/2021 6.7 (A) 4.0 - 5.6 % Final   HbA1c, POC (controlled diabetic range)  Date Value Ref Range Status  06/26/2018 7.5 (A) 0.0 - 7.0 % Final   Hgb A1c MFr Bld  Date Value Ref Range Status  12/28/2018 8.5 (H) <5.7 % of total Hgb Final    Comment:    For someone without known diabetes, a hemoglobin A1c value of 6.5% or greater indicates that they may have  diabetes and this should be confirmed with a follow-up  test. . For someone with known diabetes, a value <7% indicates  that their diabetes is well controlled and a value  greater than or equal to 7% indicates suboptimal  control. A1c targets should be individualized based on  duration of diabetes, age, comorbid conditions, and  other considerations. . Currently, no consensus exists regarding use of hemoglobin A1c for diagnosis of diabetes for children. Renella Cunas - Valid encounter within last 6 months    Recent Outpatient Visits           4 months ago Diabetes mellitus with proteinuric diabetic nephropathy [facility])   [facility] Steele Sizer, MD   5 months ago [facility] Steele Sizer, MD   8 months ago Stage 3a chronic kidney disease [facility])   [facility] Steele Sizer, MD   11 months ago [facility] Steele Sizer, MD   11 months ago Pruritic intertrigo   [facility] Delsa Grana, [place]       Future Appointments             Tomorrow Steele Sizer, MD [facility], [place]   In 4 weeks Fletcher Anon, Mertie Clause, MD [facility], [place]   In 7 months  [facility], [facility]

## 2021-09-18 ENCOUNTER — Ambulatory Visit (INDEPENDENT_AMBULATORY_CARE_PROVIDER_SITE_OTHER): Payer: Medicare HMO | Admitting: Family Medicine

## 2021-09-18 ENCOUNTER — Encounter: Payer: Self-pay | Admitting: Family Medicine

## 2021-09-18 ENCOUNTER — Other Ambulatory Visit: Payer: Self-pay

## 2021-09-18 VITALS — BP 130/68 | HR 88 | Temp 97.8°F | Resp 16 | Ht 65.0 in | Wt 190.0 lb

## 2021-09-18 DIAGNOSIS — I272 Pulmonary hypertension, unspecified: Secondary | ICD-10-CM | POA: Diagnosis not present

## 2021-09-18 DIAGNOSIS — E1169 Type 2 diabetes mellitus with other specified complication: Secondary | ICD-10-CM | POA: Diagnosis not present

## 2021-09-18 DIAGNOSIS — I5032 Chronic diastolic (congestive) heart failure: Secondary | ICD-10-CM

## 2021-09-18 DIAGNOSIS — E538 Deficiency of other specified B group vitamins: Secondary | ICD-10-CM

## 2021-09-18 DIAGNOSIS — R600 Localized edema: Secondary | ICD-10-CM

## 2021-09-18 DIAGNOSIS — N1831 Chronic kidney disease, stage 3a: Secondary | ICD-10-CM | POA: Diagnosis not present

## 2021-09-18 DIAGNOSIS — E1121 Type 2 diabetes mellitus with diabetic nephropathy: Secondary | ICD-10-CM | POA: Diagnosis not present

## 2021-09-18 DIAGNOSIS — E559 Vitamin D deficiency, unspecified: Secondary | ICD-10-CM

## 2021-09-18 DIAGNOSIS — M545 Low back pain, unspecified: Secondary | ICD-10-CM

## 2021-09-18 DIAGNOSIS — E440 Moderate protein-calorie malnutrition: Secondary | ICD-10-CM

## 2021-09-18 DIAGNOSIS — I10 Essential (primary) hypertension: Secondary | ICD-10-CM

## 2021-09-18 DIAGNOSIS — I480 Paroxysmal atrial fibrillation: Secondary | ICD-10-CM

## 2021-09-18 DIAGNOSIS — Z23 Encounter for immunization: Secondary | ICD-10-CM

## 2021-09-18 DIAGNOSIS — C772 Secondary and unspecified malignant neoplasm of intra-abdominal lymph nodes: Secondary | ICD-10-CM | POA: Diagnosis not present

## 2021-09-18 DIAGNOSIS — C7A019 Malignant carcinoid tumor of the small intestine, unspecified portion: Secondary | ICD-10-CM | POA: Diagnosis not present

## 2021-09-18 DIAGNOSIS — E890 Postprocedural hypothyroidism: Secondary | ICD-10-CM

## 2021-09-18 DIAGNOSIS — L304 Erythema intertrigo: Secondary | ICD-10-CM

## 2021-09-18 DIAGNOSIS — E785 Hyperlipidemia, unspecified: Secondary | ICD-10-CM

## 2021-09-18 DIAGNOSIS — I700 Atherosclerosis of aorta: Secondary | ICD-10-CM

## 2021-09-18 LAB — POCT GLYCOSYLATED HEMOGLOBIN (HGB A1C): Hemoglobin A1C: 6.7 % — AB (ref 4.0–5.6)

## 2021-09-18 MED ORDER — OLMESARTAN MEDOXOMIL 40 MG PO TABS: 40.0000 mg | ORAL_TABLET | Freq: Every day | ORAL | 1 refills | Status: DC

## 2021-09-18 MED ORDER — LEVOTHYROXINE SODIUM 50 MCG PO TABS
50.0000 ug | ORAL_TABLET | Freq: Every day | ORAL | 1 refills | Status: DC
Start: 2021-09-18 — End: 2022-07-08

## 2021-09-18 MED ORDER — PRAVASTATIN SODIUM 40 MG PO TABS: 40.0000 mg | ORAL_TABLET | Freq: Every day | ORAL | 1 refills | Status: DC

## 2021-09-18 MED ORDER — TRULICITY 3 MG/0.5ML ~~LOC~~ SOAJ: 3.0000 mg | SUBCUTANEOUS | 0 refills | Status: DC

## 2021-09-18 MED ORDER — ACETAMINOPHEN-CODEINE 300-30 MG PO TABS: 1.0000 | ORAL_TABLET | Freq: Four times a day (QID) | ORAL | 0 refills | Status: DC | PRN

## 2021-09-18 MED ORDER — AMLODIPINE BESYLATE 10 MG PO TABS
10.0000 mg | ORAL_TABLET | Freq: Every day | ORAL | 1 refills | Status: DC
Start: 2021-09-18 — End: 2022-05-14

## 2021-09-18 MED ORDER — POTASSIUM CHLORIDE CRYS ER 20 MEQ PO TBCR: 20.0000 meq | EXTENDED_RELEASE_TABLET | Freq: Every day | ORAL | 1 refills | Status: DC

## 2021-09-18 MED ORDER — EMPAGLIFLOZIN 25 MG PO TABS: 25.0000 mg | ORAL_TABLET | Freq: Every day | ORAL | 1 refills | Status: DC

## 2021-09-18 MED ORDER — KETOCONAZOLE 2 % EX CREA: 1.0000 "application " | TOPICAL_CREAM | Freq: Every day | CUTANEOUS | 0 refills | Status: DC

## 2021-09-18 MED ORDER — METFORMIN HCL 850 MG PO TABS
850.0000 mg | ORAL_TABLET | Freq: Two times a day (BID) | ORAL | 1 refills | Status: DC
Start: 2021-09-18 — End: 2022-06-10

## 2021-09-18 MED ORDER — CARVEDILOL 25 MG PO TABS: 25.0000 mg | ORAL_TABLET | Freq: Two times a day (BID) | ORAL | 1 refills | Status: DC

## 2021-10-03 ENCOUNTER — Other Ambulatory Visit: Payer: Self-pay | Admitting: Family Medicine

## 2021-10-03 DIAGNOSIS — E1169 Type 2 diabetes mellitus with other specified complication: Secondary | ICD-10-CM

## 2021-10-03 DIAGNOSIS — E1121 Type 2 diabetes mellitus with diabetic nephropathy: Secondary | ICD-10-CM

## 2021-10-12 ENCOUNTER — Other Ambulatory Visit: Payer: Self-pay

## 2021-10-12 DIAGNOSIS — E559 Vitamin D deficiency, unspecified: Secondary | ICD-10-CM

## 2021-10-12 MED ORDER — VITAMIN D (ERGOCALCIFEROL) 1.25 MG (50000 UNIT) PO CAPS: ORAL_CAPSULE | ORAL | 1 refills | Status: DC

## 2021-10-13 ENCOUNTER — Other Ambulatory Visit: Payer: Self-pay | Admitting: Family Medicine

## 2021-10-13 NOTE — Telephone Encounter (Signed)
Trulicity was ordered and is on med list dated 10/03/21. The med is not in the order history.  Refused new prescription request.  Requested Prescriptions  Refused Prescriptions Disp Refills   TRULICITY 1.5 MC/9.4BS SOPN [Pharmacy Med Name: TRULICITY 1.5 JG/2.8 ML PEN]      Sig: TAKE 0.5ML ONCE A WEEK.     Endocrinology:  Diabetes - GLP-1 Receptor Agonists Passed - 10/13/2021 12:47 AM      Passed - HBA1C is between 0 and 7.9 and within 180 days    Hemoglobin A1C  Date Value Ref Range Status  09/18/2021 6.7 (A) 4.0 - 5.6 % Final   HbA1c, POC (controlled diabetic range)  Date Value Ref Range Status  06/26/2018 7.5 (A) 0.0 - 7.0 % Final   Hgb A1c MFr Bld  Date Value Ref Range Status  12/28/2018 8.5 (H) <5.7 % of total Hgb Final    Comment:    For someone without known diabetes, a hemoglobin A1c value of 6.5% or greater indicates that they may have  diabetes and this should be confirmed with a follow-up  test. . For someone with known diabetes, a value <7% indicates  that their diabetes is well controlled and a value  greater than or equal to 7% indicates suboptimal  control. A1c targets should be individualized based on  duration of diabetes, age, comorbid conditions, and  other considerations. . Currently, no consensus exists regarding use of hemoglobin A1c for diagnosis of diabetes for children. Renella Cunas - Valid encounter within last 6 months    Recent Outpatient Visits           3 weeks ago Diabetes mellitus with proteinuric diabetic nephropathy [facility])   [facility] [place], Drue Stager, MD   5 months ago Diabetes mellitus with proteinuric diabetic nephropathy [facility])   [facility] Steele Sizer, MD   6 months ago [facility] Steele Sizer, MD   9 months ago Stage 3a chronic kidney disease [facility])   [facility] Steele Sizer, MD   12 months ago [facility] Steele Sizer, MD       Future Appointments             In 3 days Wellington Hampshire, MD [facility], [place]   In 3 months Steele Sizer, MD [facility], [place]   In 7 months  [facility], [facility]

## 2021-10-15 ENCOUNTER — Inpatient Hospital Stay: Payer: Medicare HMO | Attending: Oncology

## 2021-10-15 ENCOUNTER — Ambulatory Visit
Admission: RE | Admit: 2021-10-15 | Discharge: 2021-10-15 | Disposition: A | Discharge status: Home / Self Care | Payer: Medicare HMO | Source: Ambulatory Visit | Attending: Oncology | Admitting: Oncology

## 2021-10-15 ENCOUNTER — Other Ambulatory Visit: Payer: Self-pay

## 2021-10-15 DIAGNOSIS — I7789 Other specified disorders of arteries and arterioles: Secondary | ICD-10-CM | POA: Diagnosis not present

## 2021-10-15 DIAGNOSIS — E1122 Type 2 diabetes mellitus with diabetic chronic kidney disease: Secondary | ICD-10-CM | POA: Insufficient documentation

## 2021-10-15 DIAGNOSIS — E538 Deficiency of other specified B group vitamins: Secondary | ICD-10-CM | POA: Diagnosis not present

## 2021-10-15 DIAGNOSIS — I700 Atherosclerosis of aorta: Secondary | ICD-10-CM | POA: Insufficient documentation

## 2021-10-15 DIAGNOSIS — R911 Solitary pulmonary nodule: Secondary | ICD-10-CM | POA: Insufficient documentation

## 2021-10-15 DIAGNOSIS — C7B00 Secondary carcinoid tumors, unspecified site: Secondary | ICD-10-CM | POA: Diagnosis not present

## 2021-10-15 DIAGNOSIS — I2510 Atherosclerotic heart disease of native coronary artery without angina pectoris: Secondary | ICD-10-CM | POA: Insufficient documentation

## 2021-10-15 DIAGNOSIS — R978 Other abnormal tumor markers: Secondary | ICD-10-CM | POA: Diagnosis not present

## 2021-10-15 DIAGNOSIS — I289 Disease of pulmonary vessels, unspecified: Secondary | ICD-10-CM | POA: Diagnosis not present

## 2021-10-15 DIAGNOSIS — C7A019 Malignant carcinoid tumor of the small intestine, unspecified portion: Secondary | ICD-10-CM | POA: Diagnosis not present

## 2021-10-15 DIAGNOSIS — D508 Other iron deficiency anemias: Secondary | ICD-10-CM | POA: Diagnosis not present

## 2021-10-15 DIAGNOSIS — R590 Localized enlarged lymph nodes: Secondary | ICD-10-CM | POA: Insufficient documentation

## 2021-10-15 DIAGNOSIS — Z90710 Acquired absence of both cervix and uterus: Secondary | ICD-10-CM | POA: Insufficient documentation

## 2021-10-15 DIAGNOSIS — N189 Chronic kidney disease, unspecified: Secondary | ICD-10-CM | POA: Insufficient documentation

## 2021-10-15 DIAGNOSIS — D509 Iron deficiency anemia, unspecified: Secondary | ICD-10-CM | POA: Diagnosis not present

## 2021-10-15 DIAGNOSIS — C7A00 Malignant carcinoid tumor of unspecified site: Secondary | ICD-10-CM | POA: Diagnosis not present

## 2021-10-15 DIAGNOSIS — C7B01 Secondary carcinoid tumors of distant lymph nodes: Secondary | ICD-10-CM | POA: Insufficient documentation

## 2021-10-15 DIAGNOSIS — K449 Diaphragmatic hernia without obstruction or gangrene: Secondary | ICD-10-CM | POA: Diagnosis not present

## 2021-10-15 DIAGNOSIS — I129 Hypertensive chronic kidney disease with stage 1 through stage 4 chronic kidney disease, or unspecified chronic kidney disease: Secondary | ICD-10-CM | POA: Diagnosis not present

## 2021-10-15 LAB — COMPREHENSIVE METABOLIC PANEL
ALT: 10 U/L (ref 0–44)
AST: 14 U/L — ABNORMAL LOW (ref 15–41)
Albumin: 3.8 g/dL (ref 3.5–5.0)
Alkaline Phosphatase: 58 U/L (ref 38–126)
Anion gap: 9 (ref 5–15)
BUN: 15 mg/dL (ref 8–23)
CO2: 26 mmol/L (ref 22–32)
Calcium: 9.5 mg/dL (ref 8.9–10.3)
Chloride: 104 mmol/L (ref 98–111)
Creatinine, Ser: 0.91 mg/dL (ref 0.44–1.00)
GFR, Estimated: >60 mL/min (ref >60)
Glucose, Bld: 147 mg/dL — ABNORMAL HIGH (ref 70–99)
Potassium: 3.8 mmol/L (ref 3.5–5.1)
Sodium: 139 mmol/L (ref 135–145)
Total Bilirubin: 0.5 mg/dL (ref 0.3–1.2)
Total Protein: 7.1 g/dL (ref 6.5–8.1)

## 2021-10-15 LAB — CBC WITH DIFFERENTIAL/PLATELET
Abs Immature Granulocytes: 0.01 10*3/uL (ref 0.00–0.07)
Basophils Absolute: 0 10*3/uL (ref 0.0–0.1)
Basophils Relative: 1 %
Eosinophils Absolute: 0.3 10*3/uL (ref 0.0–0.5)
Eosinophils Relative: 3 %
HCT: 40.2 % (ref 36.0–46.0)
Hemoglobin: 12.6 g/dL (ref 12.0–15.0)
Immature Granulocytes: 0 %
Lymphocytes Relative: 30 %
Lymphs Abs: 2.5 10*3/uL (ref 0.7–4.0)
MCH: 28.1 pg (ref 26.0–34.0)
MCHC: 31.3 g/dL (ref 30.0–36.0)
MCV: 89.5 fL (ref 80.0–100.0)
Monocytes Absolute: 0.6 10*3/uL (ref 0.1–1.0)
Monocytes Relative: 7 %
Neutro Abs: 5.1 10*3/uL (ref 1.7–7.7)
Neutrophils Relative %: 59 %
Platelets: 267 10*3/uL (ref 150–400)
RBC: 4.49 MIL/uL (ref 3.87–5.11)
RDW: 15.9 % — ABNORMAL HIGH (ref 11.5–15.5)
WBC: 8.5 10*3/uL (ref 4.0–10.5)
nRBC: 0 % (ref 0.0–0.2)

## 2021-10-15 LAB — IRON AND TIBC
Iron: 41 ug/dL (ref 28–170)
Saturation Ratios: 11 % (ref 10.4–31.8)
TIBC: 379 ug/dL (ref 250–450)
UIBC: 338 ug/dL

## 2021-10-15 LAB — FERRITIN: Ferritin: 27 ng/mL (ref 11–307)

## 2021-10-15 LAB — VITAMIN B12: Vitamin B-12: 290 pg/mL (ref 180–914)

## 2021-10-15 LAB — POCT I-STAT CREATININE: Creatinine, Ser: 1 mg/dL (ref 0.44–1.00)

## 2021-10-15 MED ORDER — IOHEXOL 300 MG/ML  SOLN
100.0000 mL | Freq: Once | INTRAMUSCULAR | Status: AC | PRN
  Administered 2021-10-15: 100 mL via INTRAVENOUS

## 2021-10-16 ENCOUNTER — Encounter: Payer: Self-pay | Admitting: Cardiovascular Disease

## 2021-10-16 ENCOUNTER — Ambulatory Visit (INDEPENDENT_AMBULATORY_CARE_PROVIDER_SITE_OTHER): Payer: Medicare HMO | Admitting: Cardiovascular Disease

## 2021-10-16 ENCOUNTER — Ambulatory Visit (INDEPENDENT_AMBULATORY_CARE_PROVIDER_SITE_OTHER): Payer: Medicare HMO

## 2021-10-16 VITALS — BP 148/70 | HR 76 | Ht 64.0 in | Wt 195.4 lb

## 2021-10-16 DIAGNOSIS — I5032 Chronic diastolic (congestive) heart failure: Secondary | ICD-10-CM | POA: Diagnosis not present

## 2021-10-16 DIAGNOSIS — E785 Hyperlipidemia, unspecified: Secondary | ICD-10-CM | POA: Diagnosis not present

## 2021-10-16 DIAGNOSIS — I10 Essential (primary) hypertension: Secondary | ICD-10-CM | POA: Diagnosis not present

## 2021-10-16 DIAGNOSIS — I480 Paroxysmal atrial fibrillation: Secondary | ICD-10-CM

## 2021-10-16 LAB — CHROMOGRANIN A: Chromogranin A (ng/mL): 317.1 ng/mL — ABNORMAL HIGH (ref 0.0–101.8)

## 2021-10-16 NOTE — Patient Instructions (Signed)
Medication Instructions:  Your physician recommends that you continue on your current medications as directed. Please refer to the Current Medication list given to you today.  *If you need a refill on your cardiac medications before your next appointment, please call your pharmacy*   Lab Work: None ordered If you have labs (blood work) drawn today and your tests are completely normal, you will receive your results only by: [place] (if you have MyChart) OR A paper copy in the mail If you have any lab test that is abnormal or we need to change your treatment, we will call you to review the results.   Testing/Procedures: Your provider has ordered a heart monitor to wear for 14 days. This will be mailed to your home with instructions on placement. Once you have finished the time frame requested, you will return monitor in box provided.   1   Follow-Up: At [facility], you and your health needs are our priority.  As part of our continuing mission to provide you with exceptional heart care, we have created designated Provider Care Teams.  These Care Teams include your primary Cardiologist (physician) and Advanced Practice Providers (APPs -  Physician Assistants and Nurse Practitioners) who all work together to provide you with the care you need, when you need it.  We recommend signing up for the patient portal called "MyChart".  Sign up information is provided on this After Visit Summary.  MyChart is used to connect with patients for Virtual Visits (Telemedicine).  Patients are able to view lab/test results, encounter notes, upcoming appointments, etc.  Non-urgent messages can be sent to your provider as well.   To learn more about what you can do with MyChart, go to [email or web address].    Your next appointment:   6 month(s)  The format for your next appointment:   In Person  Provider:   You may see Kathlyn Sacramento, MD or one of the following Advanced Practice Providers on  your designated Care Team:   Murray Hodgkins, NP Christell Faith, PA-C Cadence Kathlen Mody, PA-C{    Other Instructions N/A

## 2021-10-16 NOTE — Progress Notes (Signed)
Cardiology Office Note   Date:  10/16/2021   ID:  Susan Bowen, DOB 12-01-1931, MRN [id number]  PCP:  Steele Sizer, MD  Cardiologist:  Kathlyn Sacramento, MD   Chief Complaint  Patient presents with   Other    6 Month f/u c/o increase of palpitations when resting/sleeping. Meds reviewed verbally with pt.       History of Present Illness: Susan Bowen is a 87 y.o. female who presents for a follow-up visit regarding paroxysmal atrial fibrillation. She has multiple chronic medical conditions that include hypertension, diabetes and hyperlipidemia.   She is not a smoker and has no family history of premature coronary artery disease. She was hospitalized in May, 2019 with chest pain in the setting of A. fib with RVR.  The dose of carvedilol was increased to 12.5 mg twice daily and she was started on Eliquis for anticoagulation.  Echocardiogram showed normal LV systolic function with grade 1 diastolic dysfunction. Lexiscan Myoview in June 2019 showed no evidence of ischemia with normal ejection fraction.  In 2020, she was diagnosed with carcinoid tumor with metastasis.  This was an incidental finding on CT of the abdomen which was done for renal stone .  This has been monitored by oncology without active treatment.  Due to worsening dyspnea, an echocardiogram was done in August which showed normal LV systolic function, grade 1 diastolic dysfunction and moderate to severe pulmonary hypertension.  We increased furosemide to 40 mg once daily after that.  She reports some improvement in shortness of breath but continues to have intermittent episodes of palpitations and some pausing in her heart especially when she is resting or trying to sleep.  These episodes are associated with dizziness.  Past Medical History:  Diagnosis Date   Allergy    Back spasm    Bunion    Carpal tunnel syndrome    Cataracta    Diabetes mellitus without complication (HCC)    Generalized osteoarthritis     GERD (gastroesophageal reflux disease)    Gout    Hemorrhoids without complication    Hyperlipidemia    Hypertension    Hypothyroidism    Impingement syndrome of right shoulder    Insomnia    Lipoma    Lung nodule    Metastatic carcinoid tumor ([place]) 03/2016   Neuritis or radiculitis due to rupture of lumbar intervertebral disc    Obesity    Osteoporosis    PAF (paroxysmal atrial fibrillation) ([place])    a. 03/2018 AF w/ RVR-->converted spont.  CHA2DS2VASc = 7-->Eliquis.   Proteinuria    Rotator cuff tear    Systolic murmur    a. 04/1606 Echo: EF 60-65%, no rwma, Gr1 DD, mild MR, mildly dil LA. Nl RV fxn. PASP 22mHg.   TIA (transient ischemic attack)    TIA (transient ischemic attack) 1998   small TIA.  no residual effects   Tinnitus of both ears    Unspecified glaucoma(365.9)    Vitamin D deficiency    Wedge compression fracture of t11-T12 vertebra, sequela     Past Surgical History:  Procedure Laterality Date   ABDOMINAL HYSTERECTOMY  1986   APPENDECTOMY     BREAST SURGERY Bilateral 1983   biopsy of each side, negative   CARPAL TUNNEL RELEASE     CATARACT EXTRACTION W/PHACO Left 09/03/2017   Procedure: CATARACT EXTRACTION PHACO AND INTRAOCULAR LENS PLACEMENT (IOC)-LEFT DIABETIC;  Surgeon: PBirder Robson MD;  Location: [facility] ORS;  Service: Ophthalmology;  Laterality: Left;  [place] 01:10.2 AP% 16.7 CDE 11.71 Fluid Pack Lot # [id number]   CATARACT EXTRACTION W/PHACO Right 09/30/2017   Procedure: CATARACT EXTRACTION PHACO AND INTRAOCULAR LENS PLACEMENT (IOC);  Surgeon: Birder Robson, MD;  Location: [facility] ORS;  Service: Ophthalmology;  Laterality: Right;  [place] 01:35.1 AP% 13.1 CDE 12.44 Fluid pack Lot # [id number]   COLON SURGERY     EYE SURGERY Left 09/03/2017   Cataract extraction   FEMUR FRACTURE SURGERY Left    FRACTURE SURGERY  2001   left femur   HEMORROIDECTOMY     polyp removed from vocal cord       Current Outpatient Medications  Medication Sig Dispense Refill    allopurinol (ZYLOPRIM) 100 MG tablet TAKE 1 TABLET BY MOUTH EVERY DAY 90 tablet 1   amLODipine (NORVASC) 10 MG tablet Take 1 tablet (10 mg total) by mouth daily. 90 tablet 1   Blood Glucose Monitoring Suppl (ACCU-CHEK AVIVA PLUS) w/Device KIT 1 kit by Does not apply route 3 (three) times daily.     carvedilol (COREG) 25 MG tablet Take 1 tablet (25 mg total) by mouth 2 (two) times daily. New dose 180 tablet 1   COMBIGAN 0.2-0.5 % ophthalmic solution Place 1 drop into both eyes 2 (two) times daily.     ELIQUIS 5 MG TABS tablet TAKE 1 TABLET BY MOUTH TWICE A DAY 180 tablet 1   empagliflozin (JARDIANCE) 25 MG TABS tablet Take 1 tablet (25 mg total) by mouth daily. 90 tablet 1   furosemide (LASIX) 20 MG tablet Take 2 tablets (40 mg total) by mouth daily. 90 tablet 1   glucose blood (ACCU-CHEK AVIVA PLUS) test strip Check fasting glucose once each morning, and check glucose as needed. 100 each 12   ketoconazole (NIZORAL) 2 % cream Apply 1 application topically daily. 60 g 0   Lancets (ACCU-CHEK MULTICLIX) lancets Use as instructed 204 each 12   Lancets (ACCU-CHEK MULTICLIX) lancets 1 each by Other route as needed for other. Use as instructed     levothyroxine (SYNTHROID) 50 MCG tablet Take 1 tablet (50 mcg total) by mouth daily before breakfast. And take two tablets twice a week 114 tablet 1   loratadine (CLARITIN) 10 MG tablet Take 1 tablet (10 mg total) by mouth daily. 90 tablet 1   metFORMIN (GLUCOPHAGE) 850 MG tablet Take 1 tablet (850 mg total) by mouth 2 (two) times daily. 180 tablet 1   MITIGARE 0.6 MG CAPS TAKE 1-2 CAPSULES (0.6-1.2 MG TOTAL) BY MOUTH DAILY AS NEEDED. FOR GOUT, NOT DAILY 30 capsule 0   olmesartan (BENICAR) 40 MG tablet Take 1 tablet (40 mg total) by mouth daily. 90 tablet 1   potassium chloride SA (KLOR-CON) 20 MEQ tablet Take 1 tablet (20 mEq total) by mouth daily. 90 tablet 1   pravastatin (PRAVACHOL) 40 MG tablet Take 1 tablet (40 mg total) by mouth daily. 90 tablet 1    tacrolimus (PROTOPIC) 0.1 % ointment APPLY TWICE A DAY TO RASH UNDER BREAST AND IN GROIN FOR 2 WEEKS, THEN DECREASE TO EVERY DAY UNTIL CLEAR, THEN PRN FLARES 100 g 2   Travoprost, BAK Free, (TRAVATAN) 0.004 % SOLN ophthalmic solution Place 1 drop into both eyes at bedtime.     Vitamin D, Ergocalciferol, (DRISDOL) 1.25 MG (50000 UNIT) CAPS capsule TAKE 1 CAPSULE BY MOUTH WEEKLY 12 capsule 1   TRULICITY 3 XB/2.8UX SOPN INJECT 3 MG AS DIRECTED ONCE A WEEK. (Patient not taking: Reported on 10/16/2021) 12 mL 0  No current facility-administered medications for this visit.   Facility-Administered Medications Ordered in Other Visits  Medication Dose Route Frequency Provider Last Rate Last Admin   cyanocobalamin ((VITAMIN B-12)) injection 1,000 mcg  1,000 mcg Intramuscular Q30 days Sindy Guadeloupe, MD   1,000 mcg at 12/20/20 1414    Allergies:   Augmentin [amoxicillin-pot clavulanate]    Social History:  The patient  reports that she has never smoked. She has never used smokeless tobacco. She reports that she does not drink alcohol and does not use drugs.   Family History:  The patient's family history includes Dementia in her mother; Diabetes in her mother; Hypertension in her mother and son.    ROS:  Please see the history of present illness.   Otherwise, review of systems are positive for none.   All other systems are reviewed and negative.    PHYSICAL EXAM: VS:  BP (!) 148/70 (BP Location: Left Arm, Patient Position: Sitting, Cuff Size: Normal)    Pulse 76    Ht 5' 4"  (1.626 m)    Wt 195 lb 6 oz (88.6 kg)    LMP  (LMP Unknown)    SpO2 97%    BMI 33.54 kg/m  , BMI Body mass index is 33.54 kg/m. GEN: Well nourished, well developed, in no acute distress  HEENT: normal  Neck: no JVD, carotid bruits, or masses Cardiac: RRR; no  rubs, or gallops.  Mild bilateral leg edema worse on the left side. 2 / 6 systolic murmur in the aortic area Respiratory:  clear to auscultation bilaterally, normal work  of breathing GI: soft, nontender, nondistended, + BS MS: no deformity or atrophy  Skin: warm and dry, no rash Neuro:  Strength and sensation are intact Psych: euthymic mood, full affect   EKG:  EKG is ordered today. The ekg ordered today demonstrates normal sinus rhythm with no significant ST or T wave changes.  Recent Labs: 05/14/2021: TSH 1.62 10/15/2021: ALT 10; BUN 15; Creatinine, Ser 1.00; Hemoglobin 12.6; Platelets 267; Potassium 3.8; Sodium 139    Lipid Panel    Component Value Date/Time   CHOL 130 01/10/2021 1152   CHOL 146 12/19/2016 0951   TRIG 129 01/10/2021 1152   HDL 54 01/10/2021 1152   HDL 50 12/19/2016 0951   CHOLHDL 2.4 01/10/2021 1152   LDLCALC 55 01/10/2021 1152      Wt Readings from Last 3 Encounters:  10/16/21 195 lb 6 oz (88.6 kg)  09/18/21 190 lb (86.2 kg)  06/04/21 191 lb 14.4 oz (87 kg)      No flowsheet data found.    ASSESSMENT AND PLAN:  1.  Paroxysmal atrial fibrillation: She continues to have intermittent palpitations and a feeling of a pause in her heart with associated dizziness.  Due to that, I requested a 2-week ZIO monitor.  For now, continue treatment with carvedilol  25 mg twice daily.  Continue Eliquis 5 mg twice daily.  I reviewed her recent labs which showed normal renal function.  2. Essential hypertension: Blood pressure is reasonably controlled on current medications.  3. Hyperlipidemia: I reviewed most recent lipid profile showed an LDL of 55.  I recommend continue with pravastatin 40 mg daily.  4.  Chronic diastolic heart failure with moderate severe pulmonary hypertension: The dose of furosemide was increased in August to 40 mg once daily with some improvement in her symptoms.   Disposition:   FU with me in 6 months   Signed, Kathlyn Sacramento, MD 10/16/21

## 2021-10-18 ENCOUNTER — Encounter: Payer: Self-pay | Admitting: Nurse Practitioner

## 2021-10-18 ENCOUNTER — Inpatient Hospital Stay (HOSPITAL_BASED_OUTPATIENT_CLINIC_OR_DEPARTMENT_OTHER): Payer: Medicare HMO | Admitting: Nurse Practitioner

## 2021-10-18 ENCOUNTER — Other Ambulatory Visit: Payer: Self-pay

## 2021-10-18 ENCOUNTER — Inpatient Hospital Stay: Payer: Medicare HMO

## 2021-10-18 VITALS — BP 119/60 | HR 73 | Temp 97.7°F | Resp 16 | Wt 194.0 lb

## 2021-10-18 DIAGNOSIS — R978 Other abnormal tumor markers: Secondary | ICD-10-CM | POA: Diagnosis not present

## 2021-10-18 DIAGNOSIS — D509 Iron deficiency anemia, unspecified: Secondary | ICD-10-CM | POA: Diagnosis not present

## 2021-10-18 DIAGNOSIS — D508 Other iron deficiency anemias: Secondary | ICD-10-CM

## 2021-10-18 DIAGNOSIS — E1122 Type 2 diabetes mellitus with diabetic chronic kidney disease: Secondary | ICD-10-CM | POA: Diagnosis not present

## 2021-10-18 DIAGNOSIS — N189 Chronic kidney disease, unspecified: Secondary | ICD-10-CM | POA: Diagnosis not present

## 2021-10-18 DIAGNOSIS — E538 Deficiency of other specified B group vitamins: Secondary | ICD-10-CM

## 2021-10-18 DIAGNOSIS — Z90710 Acquired absence of both cervix and uterus: Secondary | ICD-10-CM | POA: Diagnosis not present

## 2021-10-18 DIAGNOSIS — I129 Hypertensive chronic kidney disease with stage 1 through stage 4 chronic kidney disease, or unspecified chronic kidney disease: Secondary | ICD-10-CM | POA: Diagnosis not present

## 2021-10-18 DIAGNOSIS — C7B01 Secondary carcinoid tumors of distant lymph nodes: Secondary | ICD-10-CM | POA: Diagnosis not present

## 2021-10-18 DIAGNOSIS — C7A019 Malignant carcinoid tumor of the small intestine, unspecified portion: Secondary | ICD-10-CM | POA: Diagnosis not present

## 2021-10-18 MED ORDER — CYANOCOBALAMIN 1000 MCG/ML IJ SOLN
1000.0000 ug | INTRAMUSCULAR | Status: DC
  Administered 2021-10-18: 1000 ug via INTRAMUSCULAR
  Filled 2021-10-18: qty 1

## 2021-10-18 NOTE — Progress Notes (Signed)
Hematology/Oncology Consult Note [facility]  [phone] [phone]  Patient Care Team: Steele Sizer, MD as PCP - General (Family Medicine) Wellington Hampshire, MD as PCP - Cardiology (Cardiology) Sindy Guadeloupe, MD as Consulting Physician (Oncology) Cammie Sickle, MD as Consulting Physician (Internal Medicine) Brendolyn Patty, MD (Dermatology) Sindy Guadeloupe, MD as Consulting Physician (Hematology and Oncology)   Name of the patient: Susan Bowen  [id number]  [birth year]   Date of visit: 10/18/21  Diagnosis- 1. intra abdominal adenopathy- metastatic carcinoid on surveillance 2.  Iron and B12 deficiency anemia  Chief complaint/ Reason for visit-routine follow-up of carcinoid and anemia  Heme/Onc history: Patient is a 87 year old female who underwent CT renal stone study for flank pain.  CT scan showed pathologically enlarged lymph nodes in the right lower quadrant measuring up to 2.6 cm.  Findings concerning for lymphoma or malignant process.  Patient's past medical history significant for pulmonary hypertension, CKD, hypothyroidism among other medical problems.  She did not have any B symptoms.  This was followed by a PET CT scan Which showed 3 prominent soft tissue nodule/lymph nodes one in the mesentery at 2.1 cm with an SUV of 2.8.  Adjacent soft tissue nodule/lymph node in the right iliac fossa measuring 3.1 cm and SUV of 2.8.  Predominantly fat attenuation mesenteric nodule in the left lower quadrant measuring 2 cm without any significant FDG uptake   She remains on surveillance and has not started any somatostatin therapy yet    Interval history-  patient continues to report improvement in her diarrhea and she has about 2 or 3 episodes in the morning.  She states that she has modified her diet which seems to be working.  She is doing well for her age otherwise and denies other complaints at this time  ECOG PS- 1 Pain scale-  0  Review of systems- Review of Systems  Constitutional:  Negative for chills, fever, malaise/fatigue and weight loss.  HENT:  Negative for congestion, ear discharge and nosebleeds.   Eyes:  Negative for blurred vision.  Respiratory:  Negative for cough, hemoptysis, sputum production, shortness of breath and wheezing.   Cardiovascular:  Negative for chest pain, palpitations, orthopnea and claudication.  Gastrointestinal:  Positive for diarrhea. Negative for abdominal pain, blood in stool, constipation, heartburn, melena, nausea and vomiting.  Genitourinary:  Negative for dysuria, flank pain, frequency, hematuria and urgency.  Musculoskeletal:  Negative for back pain, joint pain and myalgias.  Skin:  Negative for rash.  Neurological:  Negative for dizziness, tingling, focal weakness, seizures, weakness and headaches.  Endo/Heme/Allergies:  Does not bruise/bleed easily.  Psychiatric/Behavioral:  Negative for depression and suicidal ideas. The patient does not have insomnia.       Allergies  Allergen Reactions   Augmentin [Amoxicillin-Pot Clavulanate] Itching    Has patient had a PCN reaction causing immediate rash, facial/tongue/throat swelling, SOB or lightheadedness with hypotension: No Has patient had a PCN reaction causing severe rash involving mucus membranes or skin necrosis: No Has patient had a PCN reaction that required hospitalization: No Has patient had a PCN reaction occurring within the last 10 years: No If all of the above answers are "NO", then may proceed with Cephalosporin use.      Past Medical History:  Diagnosis Date   Allergy    Back spasm    Bunion    Carpal tunnel syndrome    Cataracta    Diabetes mellitus without complication (HCC)    Generalized  osteoarthritis    GERD (gastroesophageal reflux disease)    Gout    Hemorrhoids without complication    Hyperlipidemia    Hypertension    Hypothyroidism    Impingement syndrome of right shoulder    Insomnia     Lipoma    Lung nodule    Metastatic carcinoid tumor ([place]) 03/2016   Neuritis or radiculitis due to rupture of lumbar intervertebral disc    Obesity    Osteoporosis    PAF (paroxysmal atrial fibrillation) ([place])    a. 03/2018 AF w/ RVR-->converted spont.  CHA2DS2VASc = 7-->Eliquis.   Proteinuria    Rotator cuff tear    Systolic murmur    a. 04/2830 Echo: EF 60-65%, no rwma, Gr1 DD, mild MR, mildly dil LA. Nl RV fxn. PASP 58mHg.   TIA (transient ischemic attack)    TIA (transient ischemic attack) 1998   small TIA.  no residual effects   Tinnitus of both ears    Unspecified glaucoma(365.9)    Vitamin D deficiency    Wedge compression fracture of t11-T12 vertebra, sequela      Past Surgical History:  Procedure Laterality Date   ABDOMINAL HYSTERECTOMY  1986   APPENDECTOMY     BREAST SURGERY Bilateral 1983   biopsy of each side, negative   CARPAL TUNNEL RELEASE     CATARACT EXTRACTION W/PHACO Left 09/03/2017   Procedure: CATARACT EXTRACTION PHACO AND INTRAOCULAR LENS PLACEMENT (IOC)-LEFT DIABETIC;  Surgeon: PBirder Robson MD;  Location: [facility] ORS;  Service: Ophthalmology;  Laterality: Left;  [place]:10.2 AP% 16.7 CDE 11.71 Fluid Pack Lot # [id number]  CATARACT EXTRACTION W/PHACO Right 09/30/2017   Procedure: CATARACT EXTRACTION PHACO AND INTRAOCULAR LENS PLACEMENT (IOC);  Surgeon: PBirder Robson MD;  Location: [facility] ORS;  Service: Ophthalmology;  Laterality: Right;  [place]:35.1 AP% 13.1 CDE 12.44 Fluid pack Lot # [id number]   COLON SURGERY     EYE SURGERY Left 09/03/2017   Cataract extraction   FEMUR FRACTURE SURGERY Left    FRACTURE SURGERY  2001   left femur   HEMORROIDECTOMY     polyp removed from vocal cord      Social History   Socioeconomic History   Marital status: Divorced    Spouse name: Not on file   Number of children: 6   Years of education: Not on file   Highest education level: 10th grade  Occupational History   Not on file  Tobacco Use   Smoking  status: Never   Smokeless tobacco: Never   Tobacco comments:    Smoking cessation materials not required  Vaping Use   Vaping Use: Never used  Substance and Sexual Activity   Alcohol use: No    Alcohol/week: 0.0 standard drinks   Drug use: No   Sexual activity: Not Currently  Other Topics Concern   Not on file  Social History Narrative   Pt lives with her son; independent ADL's, no longer drives   Social Determinants of Health   Financial Resource Strain: Low Risk    Difficulty of Paying Living Expenses: Not very hard  Food Insecurity: No Food Insecurity   Worried About RCharity fundraiserin the Last Year: Never true   Ran Out of Food in the Last Year: Never true  Transportation Needs: No Transportation Needs   Lack of Transportation (Medical): No   Lack of Transportation (Non-Medical): No  Physical Activity: Insufficiently Active   Days of Exercise per Week: 7 days   Minutes of  Exercise per Session: 10 min  Stress: No Stress Concern Present   Feeling of Stress : Not at all  Social Connections: Moderately Integrated   Frequency of Communication with Friends and Family: More than three times a week   Frequency of Social Gatherings with Friends and Family: Three times a week   Attends Religious Services: More than 4 times per year   Active Member of Clubs or Organizations: Yes   Attends Archivist Meetings: 1 to 4 times per year   Marital Status: Divorced  Human resources officer Violence: Not At Risk   Fear of Current or Ex-Partner: No   Emotionally Abused: No   Physically Abused: No   Sexually Abused: No    Family History  Problem Relation Age of Onset   Diabetes Mother    Hypertension Mother    Dementia Mother    Hypertension Son      Current Outpatient Medications:    allopurinol (ZYLOPRIM) 100 MG tablet, TAKE 1 TABLET BY MOUTH EVERY DAY, Disp: 90 tablet, Rfl: 1   amLODipine (NORVASC) 10 MG tablet, Take 1 tablet (10 mg total) by mouth Bowen., Disp: 90  tablet, Rfl: 1   Blood Glucose Monitoring Suppl (ACCU-CHEK AVIVA PLUS) w/Device KIT, 1 kit by Does not apply route 3 (three) times Bowen., Disp: , Rfl:    carvedilol (COREG) 25 MG tablet, Take 1 tablet (25 mg total) by mouth 2 (two) times Bowen. New dose, Disp: 180 tablet, Rfl: 1   COMBIGAN 0.2-0.5 % ophthalmic solution, Place 1 drop into both eyes 2 (two) times Bowen., Disp: , Rfl:    ELIQUIS 5 MG TABS tablet, TAKE 1 TABLET BY MOUTH TWICE A DAY, Disp: 180 tablet, Rfl: 1   empagliflozin (JARDIANCE) 25 MG TABS tablet, Take 1 tablet (25 mg total) by mouth Bowen., Disp: 90 tablet, Rfl: 1   furosemide (LASIX) 20 MG tablet, Take 2 tablets (40 mg total) by mouth Bowen., Disp: 90 tablet, Rfl: 1   glucose blood (ACCU-CHEK AVIVA PLUS) test strip, Check fasting glucose once each morning, and check glucose as needed., Disp: 100 each, Rfl: 12   ketoconazole (NIZORAL) 2 % cream, Apply 1 application topically Bowen., Disp: 60 g, Rfl: 0   Lancets (ACCU-CHEK MULTICLIX) lancets, Use as instructed, Disp: 204 each, Rfl: 12   Lancets (ACCU-CHEK MULTICLIX) lancets, 1 each by Other route as needed for other. Use as instructed, Disp: , Rfl:    levothyroxine (SYNTHROID) 50 MCG tablet, Take 1 tablet (50 mcg total) by mouth Bowen before breakfast. And take two tablets twice a week, Disp: 114 tablet, Rfl: 1   loratadine (CLARITIN) 10 MG tablet, Take 1 tablet (10 mg total) by mouth Bowen., Disp: 90 tablet, Rfl: 1   metFORMIN (GLUCOPHAGE) 850 MG tablet, Take 1 tablet (850 mg total) by mouth 2 (two) times Bowen., Disp: 180 tablet, Rfl: 1   MITIGARE 0.6 MG CAPS, TAKE 1-2 CAPSULES (0.6-1.2 MG TOTAL) BY MOUTH Bowen AS NEEDED. FOR GOUT, NOT Bowen, Disp: 30 capsule, Rfl: 0   olmesartan (BENICAR) 40 MG tablet, Take 1 tablet (40 mg total) by mouth Bowen., Disp: 90 tablet, Rfl: 1   potassium chloride SA (KLOR-CON) 20 MEQ tablet, Take 1 tablet (20 mEq total) by mouth Bowen., Disp: 90 tablet, Rfl: 1   pravastatin (PRAVACHOL) 40 MG tablet,  Take 1 tablet (40 mg total) by mouth Bowen., Disp: 90 tablet, Rfl: 1   tacrolimus (PROTOPIC) 0.1 % ointment, APPLY TWICE A DAY TO RASH UNDER BREAST AND IN  GROIN FOR 2 WEEKS, THEN DECREASE TO EVERY DAY UNTIL CLEAR, THEN PRN FLARES, Disp: 100 g, Rfl: 2   Travoprost, BAK Free, (TRAVATAN) 0.004 % SOLN ophthalmic solution, Place 1 drop into both eyes at bedtime., Disp: , Rfl:    TRULICITY 3 OJ/5.0KX SOPN, INJECT 3 MG AS DIRECTED ONCE A WEEK., Disp: 12 mL, Rfl: 0   Vitamin D, Ergocalciferol, (DRISDOL) 1.25 MG (50000 UNIT) CAPS capsule, TAKE 1 CAPSULE BY MOUTH WEEKLY, Disp: 12 capsule, Rfl: 1 No current facility-administered medications for this visit.  Facility-Administered Medications Ordered in Other Visits:    cyanocobalamin ((VITAMIN B-12)) injection 1,000 mcg, 1,000 mcg, Intramuscular, Q30 days, Sindy Guadeloupe, MD, 1,000 mcg at 12/20/20 1414  Physical exam:  Vitals:   10/18/21 1058  BP: 119/60  Pulse: 73  Resp: 16  Temp: 97.7 F (36.5 C)  TempSrc: Tympanic  SpO2: 100%  Weight: 194 lb (88 kg)   Physical Exam Cardiovascular:     Rate and Rhythm: Normal rate and regular rhythm.     Heart sounds: Normal heart sounds.  Pulmonary:     Effort: Pulmonary effort is normal.     Breath sounds: Normal breath sounds.  Abdominal:     General: Bowel sounds are normal.     Palpations: Abdomen is soft.  Skin:    General: Skin is warm and dry.  Neurological:     Mental Status: She is alert and oriented to person, place, and time.     CMP Latest Ref Rng & Units 10/15/2021  Glucose 70 - 99 mg/dL -  BUN 8 - 23 mg/dL -  Creatinine 0.44 - 1.00 mg/dL 1.00  Sodium 135 - 145 mmol/L -  Potassium 3.5 - 5.1 mmol/L -  Chloride 98 - 111 mmol/L -  CO2 22 - 32 mmol/L -  Calcium 8.9 - 10.3 mg/dL -  Total Protein 6.5 - 8.1 g/dL -  Total Bilirubin 0.3 - 1.2 mg/dL -  Alkaline Phos 38 - 126 U/L -  AST 15 - 41 U/L -  ALT 0 - 44 U/L -   CBC Latest Ref Rng & Units 10/15/2021  WBC 4.0 - 10.5 K/uL 8.5   Hemoglobin 12.0 - 15.0 g/dL 12.6  Hematocrit 36.0 - 46.0 % 40.2  Platelets 150 - 400 K/uL 267     Assessment and plan- Patient is a 87 y.o. female who is here for follow-up of following issues:  Metastatic small bowel carcinoid: CT from 10/15/21 was indepdently reviewed and show stable, unchanged right lower quadrant mesenteric lymphadenopathy, previously PET avid. No new spots of disease seen. Her symptoms are stable and unchanged. Her chromagranin A is rising but given stable symptoms and scan, recommend continuing to monitor and no need to start octreotide just yet. Plan tos ee her back in 3 months and if stable would scan in 6 months. If she develops worsening symptoms or chromagranin continues to rise, may consider imaging sooner.  History of B12 deficiency anemia: No anemia. B12 is stable. She can receive b12 today then again every 3 months. Goal b12 > 500. Iron Deficiency Anemia- ferritin 27, iron saturation 11%. Stable. Hold off on IV iron at this time.  RTC in 3 months for labs (cbc, cmp, chromogranin a), Janese Banks, +/- b12  Visit Diagnosis 1. Malignant carcinoid tumor of small intestine, unspecified location ([place])   2. B12 deficiency   3. Other iron deficiency anemia    Beckey Rutter, DNP, AGNP-C [place] at [facility] [phone] (clinic) 10/18/2021

## 2021-10-18 NOTE — Progress Notes (Signed)
Pt in for follow up, states had diarrhea Monday and Tuesday this week but is resolved now.  Pt states she thinks if was from the contrast she had to drink for scan.

## 2021-10-19 DIAGNOSIS — I480 Paroxysmal atrial fibrillation: Secondary | ICD-10-CM | POA: Diagnosis not present

## 2021-10-22 ENCOUNTER — Other Ambulatory Visit: Payer: Self-pay | Admitting: Family Medicine

## 2021-10-22 DIAGNOSIS — R600 Localized edema: Secondary | ICD-10-CM

## 2021-10-22 DIAGNOSIS — Z0100 Encounter for examination of eyes and vision without abnormal findings: Secondary | ICD-10-CM | POA: Diagnosis not present

## 2021-11-08 DIAGNOSIS — I480 Paroxysmal atrial fibrillation: Secondary | ICD-10-CM | POA: Diagnosis not present

## 2021-12-07 ENCOUNTER — Other Ambulatory Visit: Payer: Self-pay | Admitting: Cardiovascular Disease

## 2021-12-07 DIAGNOSIS — I480 Paroxysmal atrial fibrillation: Secondary | ICD-10-CM

## 2021-12-07 NOTE — Telephone Encounter (Signed)
Prescription refill request for Eliquis received. Indication: afib  Last office visit: arida, 10/16/2021 Scr: 0.91, 10/15/2021 Age: 88 yo  Weight: 88 kg   Refill sent.

## 2021-12-07 NOTE — Telephone Encounter (Signed)
Refill request

## 2021-12-18 ENCOUNTER — Other Ambulatory Visit: Payer: Self-pay | Admitting: Family Medicine

## 2021-12-18 DIAGNOSIS — M545 Low back pain, unspecified: Secondary | ICD-10-CM

## 2022-01-03 ENCOUNTER — Other Ambulatory Visit: Payer: Self-pay | Admitting: Podiatry

## 2022-01-03 ENCOUNTER — Other Ambulatory Visit: Payer: Self-pay | Admitting: Family Medicine

## 2022-01-03 DIAGNOSIS — L304 Erythema intertrigo: Secondary | ICD-10-CM

## 2022-01-15 ENCOUNTER — Other Ambulatory Visit: Payer: Self-pay | Admitting: *Deleted

## 2022-01-15 DIAGNOSIS — C7A019 Malignant carcinoid tumor of the small intestine, unspecified portion: Secondary | ICD-10-CM

## 2022-01-15 DIAGNOSIS — D508 Other iron deficiency anemias: Secondary | ICD-10-CM

## 2022-01-15 NOTE — Progress Notes (Signed)
Name: Susan Bowen   MRN: [id number]    DOB: [birth year]   Date:01/16/2022 ? ?     Progress Note ? ?Subjective ? ?Chief Complaint ? ?Follow Up ? ?HPI ? ?Thyroid mass: US done in 10/2020 showed, history of thyroid ablation, taking levothyroxine 50 mcg and twice a week she takes two tablets ( Tuesday and Fridays) . Last TSH was normal - last level checked 07/22 and it was 1.62 , we will recheck it today  ? ?IMPRESSION: ?1. Atrophic and moderately heterogeneous thyroid without worrisome ?new or enlarging thyroid nodules ?2. Dominant right-sided thyroid nodule has decreased in size ?compared to the 2015 examination. Imaging stability for greater than ?5 years is indicative of benign etiology. ?3. None of the other discretely measured nodules/pseudo nodules meet ?imaging criteria to recommend percutaneous sampling or continued ?dedicated follow-up. ? ? ?DMII with microalbuminuria and CKI stage III :  she is taking medications, ARB and statin therapy. She has not been checking her glucose lately   She denies polyphagia, polydipsia or  polyuria.  Her hgbA1C was 10.5%, dropped to 8.5% 7.8%, 7.5% , up to 7.8%  ,8 %  ,  7.8 % , 6.3 %  and last visit it was 6.7 % .  She has been taking Trulicity and Metformin, Jardiance, denies side effects of medication. She states her glucose still under control at home, lowest 110, highest 155  ? ?CKI: GFR in the 40's for a long time, no pruritis  ?  ?HTN: she has been taking medication. No chest pain, but occasionally has heart flutter.  She is on Norvasc, Carvedilol and Benicar given by cardiologist. BP is at goal and not side effects of medications at this time . Continue current regiment ?  ?Afib : under the care of Dr. Fletcher Anon, rate controlled, she has sob with activity but very seldom, she still has some palpitation intermittent that happens usually at night but lasts only seconds to minutes and it resolves by itself, she has some lower extremity edema that has been stable, likely from  norvasc. She also has pulmonary hypertension and last Echo. She is on Eliquis , denies any bleeding.  ? ?IMPRESSIONS Echo done on 08/22  ? ? 1. Left ventricular ejection fraction, by estimation, is 60 to 65%. The  ?left ventricle has normal function. The left ventricle has no regional  ?wall motion abnormalities. There is moderate left ventricular hypertrophy.  ?Left ventricular diastolic  ?parameters are consistent with Grade I diastolic dysfunction (impaired  ?relaxation).  ? 2. Right ventricular systolic function is normal. The right ventricular  ?size is normal. There is severely elevated pulmonary artery systolic  ?pressure. The estimated right ventricular systolic pressure is 69.6 mmHg.  ?  ?Hyperlipidemia: taking Pravastatin and denies side effects of medications, no myalgia. We will recheck labs  ? ?  ?Aorta atherosclerosis on CT chest: on statin and Eliquis. She states she bruises on arms intermittently, discussed senile purpura and reassurance given  ?  ?Carcinoid tumor with metastasis: incidental finding on CT abdomen done for evaluation of renal stone back in 03/26/2019, it showed retroperitoneal lymphadenopathy, she has since seen Dr. 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 She is not on therapy just watchful waiting.  She has regular follow ups, states diarrhea only a couple of times a week. Weight has been stable.  ? ?Malnutrition; weight went down from 240 lbs to 193 lbs in about 4 months, but weight is finally  stable, today it was 192 lbs. She states diarrhea is sporadic now at most a couple times, she is still drinks protein shakes but not in the past 3 weeks, advised high calorie diet to keep weight stable.  ? ?Anemia secondary to iron  and B12 deficiency: under the care of Dr. Janese Banks , getting B12 injections , last ferritin slightly lower than usual but no anemia.  ? ?Intermittent low back pain: cannot take NSAID's takes tylenol with codeine prn for  pain and needs a refill. 30 pills lasting a couple of months. States triggered by activity, pain is int he lower back and no radiculitis . She said having a flare this week.  ? ? ?Patient Active Problem List  ? Diagnosis Date Noted  ? CHF (congestive heart failure), NYHA class I, chronic, diastolic ([place]) 07/62/2633  ? 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 Iron deficiency anemia 12/04/2020  ? Carcinoid tumor, malignant ([place]) 09/08/2019  ? Pain in joint of left shoulder 02/25/2019  ? Pulmonary hypertension, unspecified ([place]) 12/28/2018  ? Paroxysmal A-fib ([place]) 12/28/2018  ? Chronic kidney disease, stage III (moderate) ([place]) 04/01/2018  ? Elevated parathyroid hormone 01/01/2018  ? Elevated uric acid in blood 04/29/2017  ? Plantar fasciitis of left foot 03/12/2016  ? Postprocedural hypothyroidism 01/23/2016  ? Aortic stenosis 09/26/2015  ? Allergic rhinitis, seasonal 08/08/2015  ? Benign hypertension 08/08/2015  ? History of pneumonia 08/08/2015  ? Carpal tunnel syndrome 08/08/2015  ? Cataract 08/08/2015  ? Insomnia, persistent 08/08/2015  ? Dyslipidemia 08/08/2015  ? History of depression 08/08/2015  ? Glaucoma 08/08/2015  ? Controlled gout 08/08/2015  ? Fever blister 08/08/2015  ? Bilateral hearing loss 08/08/2015  ? Hemorrhoid 08/08/2015  ? H/O iron deficiency anemia 08/08/2015  ? Benign neoplasm of colon 08/08/2015  ? Impingement syndrome of shoulder 08/08/2015  ? Osteoporosis, post-menopausal 08/08/2015  ? Generalized OA 08/08/2015  ? Multinodular goiter 08/08/2015  ? Asymptomatic varicose veins 08/08/2015  ? Polyp of vocal cord 08/08/2015  ? History of vertebral compression fracture 08/08/2015  ? Diabetes mellitus with proteinuric diabetic nephropathy ([place]) 08/08/2015  ? LVH (left ventricular hypertrophy) 08/08/2015  ? Moderate tricuspid regurgitation 08/08/2015  ? History of radioactive iodine thyroid ablation 08/08/2015  ? Lung nodule, solitary 05/12/2014  ? Chronic cough 05/11/2014  ? Vitamin  D deficiency 12/20/2009  ? Plantar fascial fibromatosis 12/20/2009  ? Personal history of fall 07/20/2008  ? Cervical radiculitis 05/11/2008  ? ? ?Past Surgical History:  ?Procedure Laterality Date  ? ABDOMINAL HYSTERECTOMY  1986  ? APPENDECTOMY    ? BREAST SURGERY Bilateral 1983  ? biopsy of each side, negative  ? CARPAL TUNNEL RELEASE    ? CATARACT EXTRACTION W/PHACO Left 09/03/2017  ? Procedure: CATARACT EXTRACTION PHACO AND INTRAOCULAR LENS PLACEMENT (IOC)-LEFT DIABETIC;  Surgeon: Birder Robson, MD;  Location: [facility] ORS;  Service: Ophthalmology;  Laterality: Left;  [place] 01:10.2 ?AP% 16.7 ?CDE 11.71 ?Fluid Pack Lot # [id number]  ? CATARACT EXTRACTION W/PHACO Right 09/30/2017  ? Procedure: CATARACT EXTRACTION PHACO AND INTRAOCULAR LENS PLACEMENT (IOC);  Surgeon: Birder Robson, MD;  Location: [facility] ORS;  Service: Ophthalmology;  Laterality: Right;  [place] 01:35.1 ?AP% 13.1 ?CDE 12.44 ?Fluid pack Lot # [id number]  ? COLON SURGERY    ? EYE SURGERY Left 09/03/2017  ? Cataract extraction  ? FEMUR FRACTURE SURGERY Left   ? FRACTURE SURGERY  2001  ? left femur  ? HEMORROIDECTOMY    ? polyp removed from vocal cord    ? ? ?  Family History  ?Problem Relation Age of Onset  ? Diabetes Mother   ? Hypertension Mother   ? Dementia Mother   ? Hypertension Son   ? ? ?Social History  ? ?Tobacco Use  ? Smoking status: Never  ? Smokeless tobacco: Never  ? Tobacco comments:  ?  Smoking cessation materials not required  ?Substance Use Topics  ? Alcohol use: No  ?  Alcohol/week: 0.0 standard drinks  ? ? ? ?Current Outpatient Medications:  ?  allopurinol (ZYLOPRIM) 100 MG tablet, TAKE 1 TABLET BY MOUTH EVERY DAY, Disp: 90 tablet, Rfl: 1 ?  amLODipine (NORVASC) 10 MG tablet, Take 1 tablet (10 mg total) by mouth daily., Disp: 90 tablet, Rfl: 1 ?  Blood Glucose Monitoring Suppl (ACCU-CHEK AVIVA PLUS) w/Device KIT, 1 kit by Does not apply route 3 (three) times daily., Disp: , Rfl:  ?  carvedilol (COREG) 25 MG tablet, Take 1 tablet (25 mg total) by  mouth 2 (two) times daily. New dose, Disp: 180 tablet, Rfl: 1 ?  COMBIGAN 0.2-0.5 % ophthalmic solution, Place 1 drop into both eyes 2 (two) times daily., Disp: , Rfl:  ?  ELIQUIS 5 MG TABS tablet, TA

## 2022-01-16 ENCOUNTER — Encounter: Payer: Self-pay | Admitting: Family Medicine

## 2022-01-16 ENCOUNTER — Ambulatory Visit (INDEPENDENT_AMBULATORY_CARE_PROVIDER_SITE_OTHER): Payer: Medicare HMO | Admitting: Family Medicine

## 2022-01-16 ENCOUNTER — Other Ambulatory Visit: Payer: Self-pay

## 2022-01-16 VITALS — BP 130/70 | HR 65 | Resp 16 | Ht 64.0 in | Wt 192.0 lb

## 2022-01-16 DIAGNOSIS — E1121 Type 2 diabetes mellitus with diabetic nephropathy: Secondary | ICD-10-CM | POA: Diagnosis not present

## 2022-01-16 DIAGNOSIS — C772 Secondary and unspecified malignant neoplasm of intra-abdominal lymph nodes: Secondary | ICD-10-CM

## 2022-01-16 DIAGNOSIS — C7A019 Malignant carcinoid tumor of the small intestine, unspecified portion: Secondary | ICD-10-CM | POA: Diagnosis not present

## 2022-01-16 DIAGNOSIS — I480 Paroxysmal atrial fibrillation: Secondary | ICD-10-CM | POA: Diagnosis not present

## 2022-01-16 DIAGNOSIS — N1831 Chronic kidney disease, stage 3a: Secondary | ICD-10-CM

## 2022-01-16 DIAGNOSIS — Z23 Encounter for immunization: Secondary | ICD-10-CM

## 2022-01-16 DIAGNOSIS — I272 Pulmonary hypertension, unspecified: Secondary | ICD-10-CM | POA: Diagnosis not present

## 2022-01-16 DIAGNOSIS — I5032 Chronic diastolic (congestive) heart failure: Secondary | ICD-10-CM | POA: Diagnosis not present

## 2022-01-16 DIAGNOSIS — E785 Hyperlipidemia, unspecified: Secondary | ICD-10-CM | POA: Diagnosis not present

## 2022-01-16 DIAGNOSIS — E890 Postprocedural hypothyroidism: Secondary | ICD-10-CM | POA: Diagnosis not present

## 2022-01-16 DIAGNOSIS — E1169 Type 2 diabetes mellitus with other specified complication: Secondary | ICD-10-CM | POA: Diagnosis not present

## 2022-01-16 DIAGNOSIS — E538 Deficiency of other specified B group vitamins: Secondary | ICD-10-CM | POA: Diagnosis not present

## 2022-01-16 DIAGNOSIS — K219 Gastro-esophageal reflux disease without esophagitis: Secondary | ICD-10-CM

## 2022-01-16 DIAGNOSIS — E559 Vitamin D deficiency, unspecified: Secondary | ICD-10-CM

## 2022-01-16 MED ORDER — TETANUS-DIPHTH-ACELL PERTUSSIS 5-2-15.5 LF-MCG/0.5 IM SUSP: 0.5000 mL | Freq: Once | INTRAMUSCULAR | 0 refills | Status: AC

## 2022-01-16 MED ORDER — SHINGRIX 50 MCG/0.5ML IM SUSR: 0.5000 mL | Freq: Once | INTRAMUSCULAR | 1 refills | Status: AC

## 2022-01-17 LAB — LIPID PANEL
Cholesterol: 154 mg/dL (ref <200)
HDL: 66 mg/dL (ref >=50)
LDL Cholesterol (Calc): 71 mg/dL (calc)
Non-HDL Cholesterol (Calc): 88 mg/dL (calc) (ref <130)
Total CHOL/HDL Ratio: 2.3 (calc) (ref <5.0)
Triglycerides: 85 mg/dL (ref <150)

## 2022-01-17 LAB — HEMOGLOBIN A1C
Hgb A1c MFr Bld: 6.9 % of total Hgb — ABNORMAL HIGH (ref <5.7)
Mean Plasma Glucose: 151 mg/dL
eAG (mmol/L): 8.4 mmol/L

## 2022-01-17 LAB — MICROALBUMIN / CREATININE URINE RATIO
Creatinine, Urine: 29 mg/dL (ref 20–275)
Microalb Creat Ratio: 14 mcg/mg creat (ref <30)
Microalb, Ur: 0.4 mg/dL

## 2022-01-17 LAB — TSH: TSH: 3.18 mIU/L (ref 0.40–4.50)

## 2022-01-21 ENCOUNTER — Inpatient Hospital Stay: Payer: Medicare HMO

## 2022-01-21 ENCOUNTER — Inpatient Hospital Stay (HOSPITAL_BASED_OUTPATIENT_CLINIC_OR_DEPARTMENT_OTHER): Payer: Medicare HMO | Admitting: Oncology

## 2022-01-21 ENCOUNTER — Other Ambulatory Visit: Payer: Self-pay

## 2022-01-21 ENCOUNTER — Encounter: Payer: Self-pay | Admitting: Oncology

## 2022-01-21 ENCOUNTER — Inpatient Hospital Stay: Payer: Medicare HMO | Attending: Oncology

## 2022-01-21 VITALS — BP 142/85 | HR 72 | Temp 96.2°F | Resp 16 | Wt 192.4 lb

## 2022-01-21 DIAGNOSIS — E538 Deficiency of other specified B group vitamins: Secondary | ICD-10-CM | POA: Insufficient documentation

## 2022-01-21 DIAGNOSIS — G4700 Insomnia, unspecified: Secondary | ICD-10-CM | POA: Diagnosis not present

## 2022-01-21 DIAGNOSIS — Z7984 Long term (current) use of oral hypoglycemic drugs: Secondary | ICD-10-CM | POA: Insufficient documentation

## 2022-01-21 DIAGNOSIS — C7A019 Malignant carcinoid tumor of the small intestine, unspecified portion: Secondary | ICD-10-CM

## 2022-01-21 DIAGNOSIS — Z7901 Long term (current) use of anticoagulants: Secondary | ICD-10-CM | POA: Insufficient documentation

## 2022-01-21 DIAGNOSIS — Z8673 Personal history of transient ischemic attack (TIA), and cerebral infarction without residual deficits: Secondary | ICD-10-CM | POA: Insufficient documentation

## 2022-01-21 DIAGNOSIS — K219 Gastro-esophageal reflux disease without esophagitis: Secondary | ICD-10-CM | POA: Insufficient documentation

## 2022-01-21 DIAGNOSIS — E039 Hypothyroidism, unspecified: Secondary | ICD-10-CM | POA: Insufficient documentation

## 2022-01-21 DIAGNOSIS — Z79899 Other long term (current) drug therapy: Secondary | ICD-10-CM | POA: Diagnosis not present

## 2022-01-21 DIAGNOSIS — Z79621 Long term (current) use of calcineurin inhibitor: Secondary | ICD-10-CM | POA: Diagnosis not present

## 2022-01-21 DIAGNOSIS — R1904 Left lower quadrant abdominal swelling, mass and lump: Secondary | ICD-10-CM | POA: Insufficient documentation

## 2022-01-21 DIAGNOSIS — M810 Age-related osteoporosis without current pathological fracture: Secondary | ICD-10-CM | POA: Insufficient documentation

## 2022-01-21 DIAGNOSIS — E785 Hyperlipidemia, unspecified: Secondary | ICD-10-CM | POA: Diagnosis not present

## 2022-01-21 DIAGNOSIS — R197 Diarrhea, unspecified: Secondary | ICD-10-CM | POA: Insufficient documentation

## 2022-01-21 DIAGNOSIS — N189 Chronic kidney disease, unspecified: Secondary | ICD-10-CM | POA: Diagnosis not present

## 2022-01-21 DIAGNOSIS — M159 Polyosteoarthritis, unspecified: Secondary | ICD-10-CM | POA: Insufficient documentation

## 2022-01-21 DIAGNOSIS — C7B00 Secondary carcinoid tumors, unspecified site: Secondary | ICD-10-CM | POA: Diagnosis not present

## 2022-01-21 DIAGNOSIS — I129 Hypertensive chronic kidney disease with stage 1 through stage 4 chronic kidney disease, or unspecified chronic kidney disease: Secondary | ICD-10-CM | POA: Diagnosis not present

## 2022-01-21 DIAGNOSIS — I480 Paroxysmal atrial fibrillation: Secondary | ICD-10-CM | POA: Diagnosis not present

## 2022-01-21 DIAGNOSIS — I272 Pulmonary hypertension, unspecified: Secondary | ICD-10-CM | POA: Insufficient documentation

## 2022-01-21 DIAGNOSIS — D509 Iron deficiency anemia, unspecified: Secondary | ICD-10-CM | POA: Diagnosis not present

## 2022-01-21 DIAGNOSIS — E1122 Type 2 diabetes mellitus with diabetic chronic kidney disease: Secondary | ICD-10-CM | POA: Diagnosis not present

## 2022-01-21 DIAGNOSIS — Z7985 Long-term (current) use of injectable non-insulin antidiabetic drugs: Secondary | ICD-10-CM | POA: Insufficient documentation

## 2022-01-21 DIAGNOSIS — D508 Other iron deficiency anemias: Secondary | ICD-10-CM

## 2022-01-21 DIAGNOSIS — E559 Vitamin D deficiency, unspecified: Secondary | ICD-10-CM | POA: Insufficient documentation

## 2022-01-21 DIAGNOSIS — R978 Other abnormal tumor markers: Secondary | ICD-10-CM | POA: Insufficient documentation

## 2022-01-21 LAB — CBC WITH DIFFERENTIAL/PLATELET
Abs Immature Granulocytes: 0.02 10*3/uL (ref 0.00–0.07)
Basophils Absolute: 0.1 10*3/uL (ref 0.0–0.1)
Basophils Relative: 1 %
Eosinophils Absolute: 0.1 10*3/uL (ref 0.0–0.5)
Eosinophils Relative: 1 %
HCT: 41.7 % (ref 36.0–46.0)
Hemoglobin: 13.1 g/dL (ref 12.0–15.0)
Immature Granulocytes: 0 %
Lymphocytes Relative: 25 %
Lymphs Abs: 2.4 10*3/uL (ref 0.7–4.0)
MCH: 27.9 pg (ref 26.0–34.0)
MCHC: 31.4 g/dL (ref 30.0–36.0)
MCV: 88.9 fL (ref 80.0–100.0)
Monocytes Absolute: 0.6 10*3/uL (ref 0.1–1.0)
Monocytes Relative: 6 %
Neutro Abs: 6.6 10*3/uL (ref 1.7–7.7)
Neutrophils Relative %: 67 %
Platelets: 265 10*3/uL (ref 150–400)
RBC: 4.69 MIL/uL (ref 3.87–5.11)
RDW: 14.6 % (ref 11.5–15.5)
WBC: 9.7 10*3/uL (ref 4.0–10.5)
nRBC: 0 % (ref 0.0–0.2)

## 2022-01-21 LAB — COMPREHENSIVE METABOLIC PANEL
ALT: 9 U/L (ref 0–44)
AST: 14 U/L — ABNORMAL LOW (ref 15–41)
Albumin: 3.5 g/dL (ref 3.5–5.0)
Alkaline Phosphatase: 52 U/L (ref 38–126)
Anion gap: 6 (ref 5–15)
BUN: 8 mg/dL (ref 8–23)
CO2: 27 mmol/L (ref 22–32)
Calcium: 9.7 mg/dL (ref 8.9–10.3)
Chloride: 106 mmol/L (ref 98–111)
Creatinine, Ser: 0.95 mg/dL (ref 0.44–1.00)
GFR, Estimated: 58 mL/min — ABNORMAL LOW (ref >60)
Glucose, Bld: 105 mg/dL — ABNORMAL HIGH (ref 70–99)
Potassium: 3.7 mmol/L (ref 3.5–5.1)
Sodium: 139 mmol/L (ref 135–145)
Total Bilirubin: 0.3 mg/dL (ref 0.3–1.2)
Total Protein: 7 g/dL (ref 6.5–8.1)

## 2022-01-21 NOTE — Progress Notes (Signed)
Patient denies new problems/concerns today.   °

## 2022-01-21 NOTE — Progress Notes (Signed)
? ? ? ?Hematology/Oncology Consult note ?[place]  ?[phone] [phone] ? ?Patient Care Team: ?Steele Sizer, MD as PCP - General (Family Medicine) ?Wellington Hampshire, MD as PCP - Cardiology (Cardiology) ?Sindy Guadeloupe, MD as Consulting Physician (Oncology) ?Cammie Sickle, MD as Consulting Physician (Internal Medicine) ?Brendolyn Patty, MD (Dermatology) ?Sindy Guadeloupe, MD as Consulting Physician (Hematology and Oncology)  ? ?Name of the patient: Susan Bowen  ?[id number]  ?[birth year]  ? ?Date of visit: 01/21/22 ? ?Diagnosis- 1. intra abdominal adenopathy- metastatic carcinoid on surveillance ?2.  Iron and B12 deficiency anemia ? ?Chief complaint/ Reason for visit-routine follow-up of carcinoid ? ?Heme/Onc history: Patient is a 88 year old female who underwent CT renal stone study for flank pain.  CT scan showed pathologically enlarged lymph nodes in the right lower quadrant measuring up to 2.6 cm.  Findings concerning for lymphoma or malignant process.  Patient's past medical history significant for pulmonary hypertension, CKD, hypothyroidism among other medical problems.  She did not have any B symptoms.  This was followed by a PET CT scan Which showed 3 prominent soft tissue nodule/lymph nodes one in the mesentery at 2.1 cm with an SUV of 2.8.  Adjacent soft tissue nodule/lymph node in the right iliac fossa measuring 3.1 cm and SUV of 2.8.  Predominantly fat attenuation mesenteric nodule in the left lower quadrant measuring 2 cm without any significant FDG uptake ?  ?She remains on surveillance and has not started any somatostatin therapy yet ? ?Interval history-she is doing well for her age overall.  Has occasional mild self-limited diarrhea but denies other new complaints at this time.  Appetite and weight have remained stable. ? ?ECOG PS- 1 ?Pain scale- 0 ? ? ?Review of systems- Review of Systems  ?Constitutional:  Negative for chills, fever,  malaise/fatigue and weight loss.  ?HENT:  Negative for congestion, ear discharge and nosebleeds.   ?Eyes:  Negative for blurred vision.  ?Respiratory:  Negative for cough, hemoptysis, sputum production, shortness of breath and wheezing.   ?Cardiovascular:  Negative for chest pain, palpitations, orthopnea and claudication.  ?Gastrointestinal:  Negative for abdominal pain, blood in stool, constipation, diarrhea, heartburn, melena, nausea and vomiting.  ?Genitourinary:  Negative for dysuria, flank pain, frequency, hematuria and urgency.  ?Musculoskeletal:  Negative for back pain, joint pain and myalgias.  ?Skin:  Negative for rash.  ?Neurological:  Negative for dizziness, tingling, focal weakness, seizures, weakness and headaches.  ?Endo/Heme/Allergies:  Does not bruise/bleed easily.  ?Psychiatric/Behavioral:  Negative for depression and suicidal ideas. The patient does not have insomnia.    ? ? ? ?Allergies  ?Allergen Reactions  ? Augmentin [Amoxicillin-Pot Clavulanate] Itching  ?  Has patient had a PCN reaction causing immediate rash, facial/tongue/throat swelling, SOB or lightheadedness with hypotension: No ?Has patient had a PCN reaction causing severe rash involving mucus membranes or skin necrosis: No ?Has patient had a PCN reaction that required hospitalization: No ?Has patient had a PCN reaction occurring within the last 10 years: No ?If all of the above answers are "NO", then may proceed with Cephalosporin use. ?  ? ? ? ?Past Medical History:  ?Diagnosis Date  ? Allergy   ? Back spasm   ? Bunion   ? Carpal tunnel syndrome   ? Cataracta   ? Diabetes mellitus without complication ([place])   ? Generalized osteoarthritis   ? GERD (gastroesophageal reflux disease)   ? Gout   ? Hemorrhoids without complication   ? Hyperlipidemia   ?  Hypertension   ? Hypothyroidism   ? Impingement syndrome of right shoulder   ? Insomnia   ? Lipoma   ? Lung nodule   ? Metastatic carcinoid tumor ([place]) 03/2016  ? Neuritis or radiculitis  due to rupture of lumbar intervertebral disc   ? Obesity   ? Osteoporosis   ? PAF (paroxysmal atrial fibrillation) ([place])   ? a. 03/2018 AF w/ RVR-->converted spont.  CHA2DS2VASc = 7-->Eliquis.  ? Proteinuria   ? Rotator cuff tear   ? Systolic murmur   ? a. 12/9516 Echo: EF 60-65%, no rwma, Gr1 DD, mild MR, mildly dil LA. Nl RV fxn. PASP 96mHg.  ? TIA (transient ischemic attack)   ? TIA (transient ischemic attack) 1998  ? small TIA.  no residual effects  ? Tinnitus of both ears   ? Unspecified glaucoma(365.9)   ? Vitamin D deficiency   ? Wedge compression fracture of t11-T12 vertebra, sequela   ? ? ? ?Past Surgical History:  ?Procedure Laterality Date  ? ABDOMINAL HYSTERECTOMY  1986  ? APPENDECTOMY    ? BREAST SURGERY Bilateral 1983  ? biopsy of each side, negative  ? CARPAL TUNNEL RELEASE    ? CATARACT EXTRACTION W/PHACO Left 09/03/2017  ? Procedure: CATARACT EXTRACTION PHACO AND INTRAOCULAR LENS PLACEMENT (IOC)-LEFT DIABETIC;  Surgeon: PBirder Robson MD;  Location: [facility] ORS;  Service: Ophthalmology;  Laterality: Left;  [place]:10.2 ?AP% 16.7 ?CDE 11.71 ?Fluid Pack Lot # [id number] ? CATARACT EXTRACTION W/PHACO Right 09/30/2017  ? Procedure: CATARACT EXTRACTION PHACO AND INTRAOCULAR LENS PLACEMENT (IOC);  Surgeon: PBirder Robson MD;  Location: [facility] ORS;  Service: Ophthalmology;  Laterality: Right;  [place]01:35.1 ?AP% 13.1 ?CDE 12.44 ?Fluid pack Lot # [id number]  ? COLON SURGERY    ? EYE SURGERY Left 09/03/2017  ? Cataract extraction  ? FEMUR FRACTURE SURGERY Left   ? FRACTURE SURGERY  2001  ? left femur  ? HEMORROIDECTOMY    ? polyp removed from vocal cord    ? ? ?Social History  ? ?Socioeconomic History  ? Marital status: Divorced  ?  Spouse name: Not on file  ? Number of children: 6  ? Years of education: Not on file  ? Highest education level: 10th grade  ?Occupational History  ? Not on file  ?Tobacco Use  ? Smoking status: Never  ? Smokeless tobacco: Never  ? Tobacco comments:  ?  Smoking cessation materials not  required  ?Vaping Use  ? Vaping Use: Never used  ?Substance and Sexual Activity  ? Alcohol use: No  ?  Alcohol/week: 0.0 standard drinks  ? Drug use: No  ? Sexual activity: Not Currently  ?Other Topics Concern  ? Not on file  ?Social History Narrative  ? Pt lives with her son; independent ADL's, no longer drives  ? ?Social Determinants of Health  ? ?Financial Resource Strain: Low Risk   ? Difficulty of Paying Living Expenses: Not very hard  ?Food Insecurity: No Food Insecurity  ? Worried About RCharity fundraiserin the Last Year: Never true  ? Ran Out of Food in the Last Year: Never true  ?Transportation Needs: No Transportation Needs  ? Lack of Transportation (Medical): No  ? Lack of Transportation (Non-Medical): No  ?Physical Activity: Insufficiently Active  ? Days of Exercise per Week: 7 days  ? Minutes of Exercise per Session: 10 min  ?Stress: No Stress Concern Present  ? Feeling of Stress : Not at all  ?Social Connections: Moderately Integrated  ?  Frequency of Communication with Friends and Family: More than three times a week  ? Frequency of Social Gatherings with Friends and Family: Three times a week  ? Attends Religious Services: More than 4 times per year  ? Active Member of Clubs or Organizations: Yes  ? Attends Archivist Meetings: 1 to 4 times per year  ? Marital Status: Divorced  ?Intimate Partner Violence: Not At Risk  ? Fear of Current or Ex-Partner: No  ? Emotionally Abused: No  ? Physically Abused: No  ? Sexually Abused: No  ? ? ?Family History  ?Problem Relation Age of Onset  ? Diabetes Mother   ? Hypertension Mother   ? Dementia Mother   ? Hypertension Son   ? ? ? ?Current Outpatient Medications:  ?  allopurinol (ZYLOPRIM) 100 MG tablet, TAKE 1 TABLET BY MOUTH EVERY DAY, Disp: 90 tablet, Rfl: 1 ?  amLODipine (NORVASC) 10 MG tablet, Take 1 tablet (10 mg total) by mouth daily., Disp: 90 tablet, Rfl: 1 ?  Blood Glucose Monitoring Suppl (ACCU-CHEK AVIVA PLUS) w/Device KIT, 1 kit by Does  not apply route 3 (three) times daily., Disp: , Rfl:  ?  carvedilol (COREG) 25 MG tablet, Take 1 tablet (25 mg total) by mouth 2 (two) times daily. New dose, Disp: 180 tablet, Rfl: 1 ?  COMBIGAN 0.2-0.5 % ophthalmic sol

## 2022-01-24 LAB — CHROMOGRANIN A: Chromogranin A (ng/mL): 197.4 ng/mL — ABNORMAL HIGH (ref 0.0–101.8)

## 2022-02-16 ENCOUNTER — Other Ambulatory Visit: Payer: Self-pay | Admitting: Family Medicine

## 2022-02-16 DIAGNOSIS — M545 Low back pain, unspecified: Secondary | ICD-10-CM

## 2022-03-13 DIAGNOSIS — H401132 Primary open-angle glaucoma, bilateral, moderate stage: Secondary | ICD-10-CM | POA: Diagnosis not present

## 2022-03-27 ENCOUNTER — Other Ambulatory Visit: Payer: Self-pay | Admitting: Family Medicine

## 2022-03-27 DIAGNOSIS — E1121 Type 2 diabetes mellitus with diabetic nephropathy: Secondary | ICD-10-CM

## 2022-03-27 DIAGNOSIS — E1169 Type 2 diabetes mellitus with other specified complication: Secondary | ICD-10-CM

## 2022-03-29 ENCOUNTER — Telehealth: Payer: Self-pay | Admitting: Family Medicine

## 2022-03-29 DIAGNOSIS — E1121 Type 2 diabetes mellitus with diabetic nephropathy: Secondary | ICD-10-CM

## 2022-03-29 DIAGNOSIS — E1169 Type 2 diabetes mellitus with other specified complication: Secondary | ICD-10-CM

## 2022-04-04 NOTE — Telephone Encounter (Signed)
PA in progress. 

## 2022-04-04 NOTE — Telephone Encounter (Signed)
Pt called in about Trulicity med, waiting on status, was told by pharmacy she needs PA. Please call back.

## 2022-04-18 ENCOUNTER — Telehealth: Payer: Self-pay | Admitting: Family Medicine

## 2022-04-18 NOTE — Telephone Encounter (Signed)
Copied from [place] [phone]. Topic: General - Other >> Apr 18, 2022 11:00 AM Everette C wrote: Reason for CRM: The patient has called for an update on their previous request for completion of a prior authorization for their TRULICITY 3 GS/8.1JS SOPN [[id number]]   Please contact the patient further when possible   The patient is concerned with missing doses of their medication

## 2022-04-22 ENCOUNTER — Ambulatory Visit (INDEPENDENT_AMBULATORY_CARE_PROVIDER_SITE_OTHER): Payer: Medicare HMO | Admitting: Medical

## 2022-04-22 ENCOUNTER — Encounter: Payer: Self-pay | Admitting: Medical

## 2022-04-22 ENCOUNTER — Other Ambulatory Visit: Payer: Self-pay | Admitting: Family Medicine

## 2022-04-22 VITALS — BP 136/67 | HR 73 | Ht 64.5 in | Wt 193.4 lb

## 2022-04-22 DIAGNOSIS — R0602 Shortness of breath: Secondary | ICD-10-CM

## 2022-04-22 DIAGNOSIS — R011 Cardiac murmur, unspecified: Secondary | ICD-10-CM

## 2022-04-22 DIAGNOSIS — R0601 Orthopnea: Secondary | ICD-10-CM | POA: Diagnosis not present

## 2022-04-22 DIAGNOSIS — I480 Paroxysmal atrial fibrillation: Secondary | ICD-10-CM | POA: Diagnosis not present

## 2022-04-22 DIAGNOSIS — I5032 Chronic diastolic (congestive) heart failure: Secondary | ICD-10-CM | POA: Diagnosis not present

## 2022-04-22 DIAGNOSIS — I272 Pulmonary hypertension, unspecified: Secondary | ICD-10-CM

## 2022-04-22 DIAGNOSIS — I10 Essential (primary) hypertension: Secondary | ICD-10-CM | POA: Diagnosis not present

## 2022-04-22 DIAGNOSIS — E782 Mixed hyperlipidemia: Secondary | ICD-10-CM | POA: Diagnosis not present

## 2022-04-22 DIAGNOSIS — E559 Vitamin D deficiency, unspecified: Secondary | ICD-10-CM

## 2022-04-22 DIAGNOSIS — G4730 Sleep apnea, unspecified: Secondary | ICD-10-CM

## 2022-04-22 DIAGNOSIS — G4733 Obstructive sleep apnea (adult) (pediatric): Secondary | ICD-10-CM

## 2022-04-22 NOTE — Patient Instructions (Signed)
Medication Instructions:   Your physician recommends that you continue on your current medications as directed. Please refer to the Current Medication list given to you today.  *If you need a refill on your cardiac medications before your next appointment, please call your pharmacy*   Lab Work:  None ordered  Testing/Procedures:  Your physician has requested that you have an echocardiogram. Echocardiography is a painless test that uses sound waves to create images of your heart. It provides your doctor with information about the size and shape of your heart and how well your heart's chambers and valves are working. This procedure takes approximately one hour. There are no restrictions for this procedure.   Follow-Up: At [facility], you and your health needs are our priority.  As part of our continuing mission to provide you with exceptional heart care, we have created designated Provider Care Teams.  These Care Teams include your primary Cardiologist (physician) and Advanced Practice Providers (APPs -  Physician Assistants and Nurse Practitioners) who all work together to provide you with the care you need, when you need it.  We recommend signing up for the patient portal called "MyChart".  Sign up information is provided on this After Visit Summary.  MyChart is used to connect with patients for Virtual Visits (Telemedicine).  Patients are able to view lab/test results, encounter notes, upcoming appointments, etc.  Non-urgent messages can be sent to your provider as well.   To learn more about what you can do with MyChart, go to [email or web address].    Your next appointment:   2 month(s)  The format for your next appointment:   In Person  Provider:   You may see Kathlyn Sacramento, MD or one of the following Advanced Practice Providers on your designated Care Team:   Murray Hodgkins, NP Christell Faith, PA-C Cadence Kathlen Mody, [place]    Other Instructions  You have been referred to  pulmonology for sleep apnea.   Important Information About Sugar

## 2022-04-22 NOTE — Progress Notes (Signed)
Cardiology Office Note:    Date:  04/22/2022   ID:  Susan Bowen, DOB [birth year], MRN [id number]  PCP:  Steele Sizer, MD  [facility] Cardiologist:  Kathlyn Sacramento, MD  [facility] Electrophysiologist:  None   Referring MD: Steele Sizer, MD   Chief Complaint: 6 month follow-up  History of Present Illness:    Susan Bowen is a 88 y.o. female with a hx of paroxysmal Afib, HTN, diabetes, HFpEF, pulmonary HTN, HLD, untreated OSA who presents for 6 month follow-up.   She was hospitalized in May 2019 with chest pain in the setting of Afib RVR. Dose of Coreg was increased and she was started on Eliquis for anticoagulation. Echo showed normal LVSF with G1DD. Lexiscan Myoview in June 2019 showed no ischemia with normal EF.   In 2020 she was diagnosed with carcinoid tumor with metastasis. This was an incidental finding on CT of the abdomen which was doe for renal stone. This has been monitored by oncology without active treatment.   Echo in August 2022 for DOE showed normal LVSF, G1DD and moderate to severe pulmonary HTN. Lasix was increased.   Last seen 10/16/21 and reported improvement of SOB, but persistent palpitations. A heart monitor was ordered . This showed NSR with 23 runs of SVT, longest 20 beats, occasional PACs and PVCs.   Today, the patient reports trouble breathing at night. This has been going on for the last couple of months. It has not been getting worse. Has trouble catching her breath for the first few minutes once she lays down and then it goes away. She has some dyspnea on exertion. Also noted lower leg edema. No chest pain. She had sleep study before that showed mild OSA, not on CPAP. She would have trouble traveling to [employer]. The patient has rare palpitations. She is taking lasix 8m daily. She is compliant with Eliquis, denies bleeding issues.    Past Medical History:  Diagnosis Date   Allergy    Back spasm    Bunion    Carpal tunnel syndrome     Cataracta    Diabetes mellitus without complication (HCC)    Generalized osteoarthritis    GERD (gastroesophageal reflux disease)    Gout    Hemorrhoids without complication    Hyperlipidemia    Hypertension    Hypothyroidism    Impingement syndrome of right shoulder    Insomnia    Lipoma    Lung nodule    [place]ast[place] carcin[place] 03[place]   Neuritis or radiculitis due to rupture of lumbar intervertebral disc    Obesity    Osteoporosis    PAF (paroxysmal atrial fibrillation) ([place]    a. 03/2018 AF w/ RVR-->converted spont.  CHA2DS2VASc = 7-->Eliquis.   Proteinuria    Rotator cuff tear    Systolic murmur    a. 52/7062Echo: EF 60-65%, no rwma, Gr1 DD, mild MR, mildly dil LA. Nl RV fxn. PASP 446mg.   TIA (transient ischemic attack)    TIA (transient ischemic attack) 1998   small TIA.  no residual effects   Tinnitus of both ears    Unspecified glaucoma(365.9)    Vitamin D deficiency    Wedge compression fracture of t11-T12 vertebra, sequela     Past Surgical History:  Procedure Laterality Date   ABDOMINAL HYSTERECTOMY  1986   APPENDECTOMY     BREAST SURGERY Bilateral 1983   biopsy of each side, negative   CARPAL TUNNEL RELEASE  CATARACT EXTRACTION W/PHACO Left 09/03/2017   Procedure: CATARACT EXTRACTION PHACO AND INTRAOCULAR LENS PLACEMENT (IOC)-LEFT DIABETIC;  Surgeon: Birder Robson, MD;  Location: [facility] ORS;  Service: Ophthalmology;  Laterality: Left;  [place] 01:10.2 AP% 16.7 CDE 11.71 Fluid Pack Lot # [id number]   CATARACT EXTRACTION W/PHACO Right 09/30/2017   Procedure: CATARACT EXTRACTION PHACO AND INTRAOCULAR LENS PLACEMENT (IOC);  Surgeon: Birder Robson, MD;  Location: [facility] ORS;  Service: Ophthalmology;  Laterality: Right;  [place] 01:35.1 AP% 13.1 CDE 12.44 Fluid pack Lot # [id number]   COLON SURGERY     EYE SURGERY Left 09/03/2017   Cataract extraction   FEMUR FRACTURE SURGERY Left    FRACTURE SURGERY  2001   left femur   HEMORROIDECTOMY     polyp  removed from vocal cord      Current Medications: Current Meds  Medication Sig   Acetaminophen-Codeine 300-30 MG tablet 1 TAB EVERY 6 HRS AS NEEDED FOR UP TO 7 DAYS FOR PAIN. AS NEEDED FOR CHRONIC PAIN   allopurinol (ZYLOPRIM) 100 MG tablet TAKE 1 TABLET BY MOUTH EVERY DAY   amLODipine (NORVASC) 10 MG tablet Take 1 tablet (10 mg total) by mouth daily.   Blood Glucose Monitoring Suppl (ACCU-CHEK AVIVA PLUS) w/Device KIT 1 kit by Does not apply route 3 (three) times daily.   carvedilol (COREG) 25 MG tablet Take 1 tablet (25 mg total) by mouth 2 (two) times daily. New dose   COMBIGAN 0.2-0.5 % ophthalmic solution Place 1 drop into both eyes 2 (two) times daily.   ELIQUIS 5 MG TABS tablet TAKE 1 TABLET BY MOUTH TWICE A DAY   empagliflozin (JARDIANCE) 25 MG TABS tablet Take 1 tablet (25 mg total) by mouth daily.   furosemide (LASIX) 20 MG tablet TAKE 1 TABLET BY MOUTH EVERY DAY   glucose blood (ACCU-CHEK AVIVA PLUS) test strip Check fasting glucose once each morning, and check glucose as needed.   ketoconazole (NIZORAL) 2 % cream APPLY 1 APPLICATION TOPICALLY DAILY   Lancets (ACCU-CHEK MULTICLIX) lancets Use as instructed   Lancets (ACCU-CHEK MULTICLIX) lancets 1 each by Other route as needed for other. Use as instructed   latanoprost (XALATAN) 0.005 % ophthalmic solution SMARTSIG:1 Drop(s) In Eye(s) Every Evening   levothyroxine (SYNTHROID) 50 MCG tablet Take 1 tablet (50 mcg total) by mouth daily before breakfast. And take two tablets twice a week   loratadine (CLARITIN) 10 MG tablet Take 1 tablet (10 mg total) by mouth daily.   metFORMIN (GLUCOPHAGE) 850 MG tablet Take 1 tablet (850 mg total) by mouth 2 (two) times daily.   MITIGARE 0.6 MG CAPS TAKE 1-2 CAPSULES (0.6-1.2 MG TOTAL) BY MOUTH DAILY AS NEEDED. FOR GOUT, NOT DAILY   olmesartan (BENICAR) 40 MG tablet Take 1 tablet (40 mg total) by mouth daily.   potassium chloride SA (KLOR-CON) 20 MEQ tablet Take 1 tablet (20 mEq total) by mouth  daily.   pravastatin (PRAVACHOL) 40 MG tablet Take 1 tablet (40 mg total) by mouth daily.   tacrolimus (PROTOPIC) 0.1 % ointment APPLY TWICE A DAY TO RASH UNDER BREAST AND IN GROIN FOR 2 WEEKS, THEN DECREASE TO EVERY DAY UNTIL CLEAR, THEN PRN FLARES   Travoprost, BAK Free, (TRAVATAN) 0.004 % SOLN ophthalmic solution Place 1 drop into both eyes at bedtime.   TRULICITY 3 EX/9.3ZJ SOPN INJECT 3 MG AS DIRECTED ONCE A WEEK.   Vitamin D, Ergocalciferol, (DRISDOL) 1.25 MG (50000 UNIT) CAPS capsule TAKE 1 CAPSULE BY MOUTH WEEKLY     Allergies:  Augmentin [amoxicillin-pot clavulanate]   Social History   Socioeconomic History   Marital status: Divorced    Spouse name: Not on file   Number of children: 6   Years of education: Not on file   Highest education level: 10th grade  Occupational History   Not on file  Tobacco Use   Smoking status: Never   Smokeless tobacco: Never   Tobacco comments:    Smoking cessation materials not required  Vaping Use   Vaping Use: Never used  Substance and Sexual Activity   Alcohol use: No    Alcohol/week: 0.0 standard drinks of alcohol   Drug use: No   Sexual activity: Not Currently  Other Topics Concern   Not on file  Social History Narrative   Pt lives with her son; independent ADL's, no longer drives   Social Determinants of Health   Financial Resource Strain: Low Risk  (05/10/2021)   Overall Financial Resource Strain (CARDIA)    Difficulty of Paying Living Expenses: Not very hard  Food Insecurity: No Food Insecurity (05/10/2021)   Hunger Vital Sign    Worried About Running Out of Food in the Last Year: Never true    Ran Out of Food in the Last Year: Never true  Transportation Needs: No Transportation Needs (05/10/2021)   PRAPARE - Hydrologist (Medical): No    Lack of Transportation (Non-Medical): No  Physical Activity: Insufficiently Active (05/10/2021)   Exercise Vital Sign    Days of Exercise per Week: 7 days     Minutes of Exercise per Session: 10 min  Stress: No Stress Concern Present (05/10/2021)   [place]    Feeling of Stress : Not at all  Social Connections: Moderately Integrated (05/10/2021)   Social Connection and Isolation Panel [NHANES]    Frequency of Communication with Friends and Family: More than three times a week    Frequency of Social Gatherings with Friends and Family: Three times a week    Attends Religious Services: More than 4 times per year    Active Member of Clubs or Organizations: Yes    Attends Archivist Meetings: 1 to 4 times per year    Marital Status: Divorced     Family History: The patient's family history includes Dementia in her mother; Diabetes in her mother; Hypertension in her mother and son.  ROS:   Please see the history of present illness.     All other systems reviewed and are negative.  EKGs/Labs/Other Studies Reviewed:    The following studies were reviewed today:  Heart monitor 11/2021 Patch Wear Time:  14 days and 0 hours (2022-12-16T15:20:22-0500 to 2022-12-30T15:20:26-0500)   Patient had a min HR of 55 bpm, max HR of 203 bpm, and avg HR of 74 bpm. Predominant underlying rhythm was Sinus Rhythm.  23 Supraventricular Tachycardia runs occurred, the run with the fastest interval lasting 8 beats with a max rate of 203 bpm, the longest lasting 20 beats with an avg rate of 159 bpm. Supraventricular Tachycardia was detected within +/- 45 seconds of symptomatic patient event(s). Occasional PACs and rare PVCs.  Echo 06/2021  1. Left ventricular ejection fraction, by estimation, is 60 to 65%. The  left ventricle has normal function. The left ventricle has no regional  wall motion abnormalities. There is moderate left ventricular hypertrophy.  Left ventricular diastolic  parameters are consistent with Grade I diastolic dysfunction (impaired  relaxation).  2. Right ventricular  systolic function is normal. The right ventricular  size is normal. There is severely elevated pulmonary artery systolic  pressure. The estimated right ventricular systolic pressure is 87.8 mmHg.    EKG:  EKG is  ordered today.  The ekg ordered today demonstrates NSR 73bpm, nonspecific T wave changes  Recent Labs: 01/16/2022: TSH 3.18 01/21/2022: ALT 9; BUN 8; Creatinine, Ser 0.95; Hemoglobin 13.1; Platelets 265; Potassium 3.7; Sodium 139  Recent Lipid Panel    Component Value Date/Time   CHOL 154 01/16/2022 1150   CHOL 146 12/19/2016 0951   TRIG 85 01/16/2022 1150   HDL 66 01/16/2022 1150   HDL 50 12/19/2016 0951   CHOLHDL 2.3 01/16/2022 1150   LDLCALC 71 01/16/2022 1150     Physical Exam:    VS:  BP 136/67 (BP Location: Left Arm, Patient Position: Sitting, Cuff Size: Large)   Pulse 73   Ht 5' 4.5" (1.638 m)   Wt 193 lb 6.4 oz (87.7 kg)   LMP  (LMP Unknown)   SpO2 94%   BMI 32.68 kg/m     Wt Readings from Last 3 Encounters:  04/22/22 193 lb 6.4 oz (87.7 kg)  01/21/22 192 lb 6.4 oz (87.3 kg)  01/16/22 192 lb (87.1 kg)     GEN:  Well nourished, well developed in no acute distress HEENT: Normal NECK: No JVD; No carotid bruits LYMPHATICS: No lymphadenopathy CARDIAC: RRR, + murmur, rubs, gallops RESPIRATORY:  Clear to auscultation without rales, wheezing or rhonchi  ABDOMEN: Soft, non-tender, non-distended MUSCULOSKELETAL:  Trace lower leg edema; No deformity  SKIN: Warm and dry NEUROLOGIC:  Alert and oriented x 3 PSYCHIATRIC:  Normal affect   ASSESSMENT:    1. Orthopnea   2. SOB (shortness of breath)   3. Sleep apnea, unspecified type   4. PAF (paroxysmal atrial fibrillation) ([place])   5. Chronic diastolic heart failure (HCC)   6. Pulmonary hypertension, unspecified ([place])   7. Essential hypertension   8. Hyperlipidemia, mixed   9. OSA (obstructive sleep apnea)   10. Murmur    PLAN:    In order of problems listed above:  Orthopnea/SOB at  night HFpEF Moderate severe pulmonary HTN Patient reports trouble laying flat at night over the last few months. She has chronic dyspnea on exertion that is unchanged. On exam she has trace lower leg edema for which she takes lasix 30m daily. Weights are stable. No chest pain. Prior echo LVEF 60-65%, no WMA, moderate LVH, G1DD, normal RV function, severely elevated pulmonary artery systolic pressure. She has untreated mild sleep apnea. I suspect symptoms are multifactorial from untreated sleep apnea and heart failure. I will repeat an echocardiogram. Refer to pulmonology for OSA/pulmonary HTN. Continue current lasix dose, recommend she take an extra dose as needed. We will see her back in 2 months to assess breathing, may consider further ischemic testing at that time. Continue Coreg.   Paroxysmal Afib EKG shows NSR. She is on Eliquis 563mID, she denies bleeding issues. Continue rate control with Coreg. Recent heart monitor did not show afib.   HTN BP is reasonable, continue Coreg and amlodipine.   HLD LDL 71, HDL 66, TG 85, chol 154. Continue Pravastatin.   OSA Patient was diagnosed with mild OSA  in 2019. Echo in the past has shown elevated pulmonary pressures. I will refer to pulmonology to discuss treatment as above.  Murmur Echo as above.   Disposition: Follow up in 2 month(s) with MD/APP  Signed, Maxtyn Nuzum Ninfa Meeker, PA-C  04/22/2022 2:46 PM    [facility]

## 2022-04-24 ENCOUNTER — Ambulatory Visit: Payer: Medicare HMO | Admitting: Cardiovascular Disease

## 2022-04-27 ENCOUNTER — Other Ambulatory Visit: Payer: Self-pay | Admitting: Family Medicine

## 2022-04-27 DIAGNOSIS — E1121 Type 2 diabetes mellitus with diabetic nephropathy: Secondary | ICD-10-CM

## 2022-04-27 DIAGNOSIS — E1169 Type 2 diabetes mellitus with other specified complication: Secondary | ICD-10-CM

## 2022-05-09 ENCOUNTER — Other Ambulatory Visit: Payer: Self-pay

## 2022-05-09 ENCOUNTER — Ambulatory Visit
Admission: RE | Admit: 2022-05-09 | Discharge: 2022-05-09 | Disposition: A | Discharge status: Home / Self Care | Payer: Medicare HMO | Source: Ambulatory Visit | Attending: Oncology | Admitting: Oncology

## 2022-05-09 ENCOUNTER — Inpatient Hospital Stay: Payer: Medicare HMO | Attending: Oncology

## 2022-05-09 DIAGNOSIS — C7A019 Malignant carcinoid tumor of the small intestine, unspecified portion: Secondary | ICD-10-CM | POA: Insufficient documentation

## 2022-05-09 DIAGNOSIS — D509 Iron deficiency anemia, unspecified: Secondary | ICD-10-CM | POA: Diagnosis not present

## 2022-05-09 DIAGNOSIS — N200 Calculus of kidney: Secondary | ICD-10-CM | POA: Diagnosis not present

## 2022-05-09 DIAGNOSIS — D519 Vitamin B12 deficiency anemia, unspecified: Secondary | ICD-10-CM | POA: Diagnosis not present

## 2022-05-09 DIAGNOSIS — E538 Deficiency of other specified B group vitamins: Secondary | ICD-10-CM

## 2022-05-09 DIAGNOSIS — C7B00 Secondary carcinoid tumors, unspecified site: Secondary | ICD-10-CM

## 2022-05-09 LAB — CBC WITH DIFFERENTIAL/PLATELET
Abs Immature Granulocytes: 0.02 10*3/uL (ref 0.00–0.07)
Basophils Absolute: 0.1 10*3/uL (ref 0.0–0.1)
Basophils Relative: 1 %
Eosinophils Absolute: 0.1 10*3/uL (ref 0.0–0.5)
Eosinophils Relative: 2 %
HCT: 40.7 % (ref 36.0–46.0)
Hemoglobin: 12.6 g/dL (ref 12.0–15.0)
Immature Granulocytes: 0 %
Lymphocytes Relative: 27 %
Lymphs Abs: 2.4 10*3/uL (ref 0.7–4.0)
MCH: 27.7 pg (ref 26.0–34.0)
MCHC: 31 g/dL (ref 30.0–36.0)
MCV: 89.5 fL (ref 80.0–100.0)
Monocytes Absolute: 0.6 10*3/uL (ref 0.1–1.0)
Monocytes Relative: 7 %
Neutro Abs: 5.7 10*3/uL (ref 1.7–7.7)
Neutrophils Relative %: 63 %
Platelets: 274 10*3/uL (ref 150–400)
RBC: 4.55 MIL/uL (ref 3.87–5.11)
RDW: 15.4 % (ref 11.5–15.5)
WBC: 8.9 10*3/uL (ref 4.0–10.5)
nRBC: 0 % (ref 0.0–0.2)

## 2022-05-09 LAB — COMPREHENSIVE METABOLIC PANEL
ALT: 10 U/L (ref 0–44)
AST: 14 U/L — ABNORMAL LOW (ref 15–41)
Albumin: 3.8 g/dL (ref 3.5–5.0)
Alkaline Phosphatase: 53 U/L (ref 38–126)
Anion gap: 7 (ref 5–15)
BUN: 13 mg/dL (ref 8–23)
CO2: 26 mmol/L (ref 22–32)
Calcium: 9.8 mg/dL (ref 8.9–10.3)
Chloride: 104 mmol/L (ref 98–111)
Creatinine, Ser: 0.98 mg/dL (ref 0.44–1.00)
GFR, Estimated: 56 mL/min — ABNORMAL LOW (ref >60)
Glucose, Bld: 132 mg/dL — ABNORMAL HIGH (ref 70–99)
Potassium: 3.8 mmol/L (ref 3.5–5.1)
Sodium: 137 mmol/L (ref 135–145)
Total Bilirubin: 0.6 mg/dL (ref 0.3–1.2)
Total Protein: 7.3 g/dL (ref 6.5–8.1)

## 2022-05-09 LAB — VITAMIN B12: Vitamin B-12: 214 pg/mL (ref 180–914)

## 2022-05-09 MED ORDER — IOHEXOL 300 MG/ML  SOLN
100.0000 mL | Freq: Once | INTRAMUSCULAR | Status: AC | PRN
Start: 2022-05-09 — End: 2022-05-09
  Administered 2022-05-09: 100 mL via INTRAVENOUS

## 2022-05-10 ENCOUNTER — Telehealth: Payer: Self-pay

## 2022-05-10 NOTE — Telephone Encounter (Signed)
Sometimes oral contrast can cause diarrhea. Recommend OTC prn imodium 2 mg with each bowel movement. If she does not feel better over next 2-3 days call us

## 2022-05-10 NOTE — Telephone Encounter (Signed)
Spoke with patient to let her know Dr. Elroy Channel response below. She expressed understanding and will take imodium has needed for her diarrhea. She will also keep drinking plenty of fluids. Will follow back up with [place] on 7/11 at her appointment with Dr. Janese Banks.

## 2022-05-10 NOTE — Telephone Encounter (Signed)
Patient called in stating that she had a Ct scan yesterday(7/6) and since leaving from the CT scan place she has not been feeling good.  After speaking with the patient, patient states she is having diarrhea (" I've had diarrhea since I left the place yesterday, I went about 8 times up until 11pm last night, and I've went once this morning"), headache,cold chills, and feels very weak this morning. Patient states she is trying to drink plenty of fluids (ginger ale and water). Patient denies any nausea or vomiting. Call back [phone]

## 2022-05-13 ENCOUNTER — Other Ambulatory Visit: Payer: Self-pay | Admitting: Family Medicine

## 2022-05-13 DIAGNOSIS — I10 Essential (primary) hypertension: Secondary | ICD-10-CM

## 2022-05-13 DIAGNOSIS — N1831 Chronic kidney disease, stage 3a: Secondary | ICD-10-CM

## 2022-05-13 DIAGNOSIS — R600 Localized edema: Secondary | ICD-10-CM

## 2022-05-14 ENCOUNTER — Inpatient Hospital Stay: Payer: Medicare HMO

## 2022-05-14 ENCOUNTER — Ambulatory Visit: Payer: Medicare HMO | Admitting: Family Medicine

## 2022-05-14 ENCOUNTER — Encounter: Payer: Self-pay | Admitting: Oncology

## 2022-05-14 ENCOUNTER — Inpatient Hospital Stay (HOSPITAL_BASED_OUTPATIENT_CLINIC_OR_DEPARTMENT_OTHER): Payer: Medicare HMO | Admitting: Oncology

## 2022-05-14 DIAGNOSIS — D519 Vitamin B12 deficiency anemia, unspecified: Secondary | ICD-10-CM | POA: Diagnosis not present

## 2022-05-14 DIAGNOSIS — D508 Other iron deficiency anemias: Secondary | ICD-10-CM

## 2022-05-14 DIAGNOSIS — C7B00 Secondary carcinoid tumors, unspecified site: Secondary | ICD-10-CM | POA: Diagnosis not present

## 2022-05-14 DIAGNOSIS — D509 Iron deficiency anemia, unspecified: Secondary | ICD-10-CM | POA: Diagnosis not present

## 2022-05-14 DIAGNOSIS — E538 Deficiency of other specified B group vitamins: Secondary | ICD-10-CM | POA: Diagnosis not present

## 2022-05-14 MED ORDER — CYANOCOBALAMIN 1000 MCG/ML IJ SOLN
1000.0000 ug | INTRAMUSCULAR | Status: DC
  Administered 2022-05-14: 1000 ug via INTRAMUSCULAR
  Filled 2022-05-14: qty 1

## 2022-05-14 NOTE — Progress Notes (Signed)
Hematology/Oncology Consult note [facility]  Telephone:([phone] [phone]  Patient Care Team: Steele Sizer, MD as PCP - General (Family Medicine) Wellington Hampshire, MD as PCP - Cardiology (Cardiology) Sindy Guadeloupe, MD as Consulting Physician (Oncology) Cammie Sickle, MD as Consulting Physician (Internal Medicine) Brendolyn Patty, MD (Dermatology) Sindy Guadeloupe, MD as Consulting Physician (Hematology and Oncology)   Name of the patient: Susan Bowen  [id number]  1932-04-23   Date of visit: 05/14/22  Diagnosis- 1. intra abdominal adenopathy- metastatic carcinoid on surveillance 2.  Iron and B12 deficiency anemia  Chief complaint/ Reason for visit-routine follow-up of carcinoid  Heme/Onc history: Patient is a 88 year old female who underwent CT renal stone study for flank pain.  CT scan showed pathologically enlarged lymph nodes in the right lower quadrant measuring up to 2.6 cm.  Findings concerning for lymphoma or malignant process.  Patient's past medical history significant for pulmonary hypertension, CKD, hypothyroidism among other medical problems.  She did not have any B symptoms.  This was followed by a PET CT scan Which showed 3 prominent soft tissue nodule/lymph nodes one in the mesentery at 2.1 cm with an SUV of 2.8.  Adjacent soft tissue nodule/lymph node in the right iliac fossa measuring 3.1 cm and SUV of 2.8.  Predominantly fat attenuation mesenteric nodule in the left lower quadrant measuring 2 cm without any significant FDG uptake   She remains on surveillance and has not started any somatostatin therapy yet  Interval history-patient is doing well for her age.  Denies any specific complaints at this time.  She is taking oral B12 Gummies daily.  ECOG PS- 1 Pain scale- 0   Review of systems- Review of Systems  Constitutional:  Negative for chills, fever, malaise/fatigue and weight loss.  HENT:  Negative for  congestion, ear discharge and nosebleeds.   Eyes:  Negative for blurred vision.  Respiratory:  Negative for cough, hemoptysis, sputum production, shortness of breath and wheezing.   Cardiovascular:  Negative for chest pain, palpitations, orthopnea and claudication.  Gastrointestinal:  Negative for abdominal pain, blood in stool, constipation, diarrhea, heartburn, melena, nausea and vomiting.  Genitourinary:  Negative for dysuria, flank pain, frequency, hematuria and urgency.  Musculoskeletal:  Negative for back pain, joint pain and myalgias.  Skin:  Negative for rash.  Neurological:  Negative for dizziness, tingling, focal weakness, seizures, weakness and headaches.  Endo/Heme/Allergies:  Does not bruise/bleed easily.  Psychiatric/Behavioral:  Negative for depression and suicidal ideas. The patient does not have insomnia.       Allergies  Allergen Reactions   Augmentin [Amoxicillin-Pot Clavulanate] Itching    Has patient had a PCN reaction causing immediate rash, facial/tongue/throat swelling, SOB or lightheadedness with hypotension: No Has patient had a PCN reaction causing severe rash involving mucus membranes or skin necrosis: No Has patient had a PCN reaction that required hospitalization: No Has patient had a PCN reaction occurring within the last 10 years: No If all of the above answers are "NO", then may proceed with Cephalosporin use.      Past Medical History:  Diagnosis Date   Allergy    Back spasm    Bunion    Carpal tunnel syndrome    Cataracta    Diabetes mellitus without complication (HCC)    Generalized osteoarthritis    GERD (gastroesophageal reflux disease)    Gout    Hemorrhoids without complication    Hyperlipidemia    Hypertension    Hypothyroidism  Impingement syndrome of right shoulder    Insomnia    Lipoma    Lung nodule    Metastatic carcinoid tumor ([place]) 03/2016   Neuritis or radiculitis due to rupture of lumbar intervertebral disc    Obesity     Osteoporosis    PAF (paroxysmal atrial fibrillation) ([place])    a. 03/2018 AF w/ RVR-->converted spont.  CHA2DS2VASc = 7-->Eliquis.   Proteinuria    Rotator cuff tear    Systolic murmur    a. 0/0370 Echo: EF 60-65%, no rwma, Gr1 DD, mild MR, mildly dil LA. Nl RV fxn. PASP 13mHg.   TIA (transient ischemic attack)    TIA (transient ischemic attack) 1998   small TIA.  no residual effects   Tinnitus of both ears    Unspecified glaucoma(365.9)    Vitamin D deficiency    Wedge compression fracture of t11-T12 vertebra, sequela      Past Surgical History:  Procedure Laterality Date   ABDOMINAL HYSTERECTOMY  1986   APPENDECTOMY     BREAST SURGERY Bilateral 1983   biopsy of each side, negative   CARPAL TUNNEL RELEASE     CATARACT EXTRACTION W/PHACO Left 09/03/2017   Procedure: CATARACT EXTRACTION PHACO AND INTRAOCULAR LENS PLACEMENT (IOC)-LEFT DIABETIC;  Surgeon: PBirder Robson MD;  Location: [facility] ORS;  Service: Ophthalmology;  Laterality: Left;  [place]:10.2 AP% 16.7 CDE 11.71 Fluid Pack Lot # [id number]  CATARACT EXTRACTION W/PHACO Right 09/30/2017   Procedure: CATARACT EXTRACTION PHACO AND INTRAOCULAR LENS PLACEMENT (IOC);  Surgeon: PBirder Robson MD;  Location: [facility] ORS;  Service: Ophthalmology;  Laterality: Right;  [place]:35.1 AP% 13.1 CDE 12.44 Fluid pack Lot # [id number]   COLON SURGERY     EYE SURGERY Left 09/03/2017   Cataract extraction   FEMUR FRACTURE SURGERY Left    FRACTURE SURGERY  2001   left femur   HEMORROIDECTOMY     polyp removed from vocal cord      Social History   Socioeconomic History   Marital status: Divorced    Spouse name: Not on file   Number of children: 6   Years of education: Not on file   Highest education level: 10th grade  Occupational History   Not on file  Tobacco Use   Smoking status: Never   Smokeless tobacco: Never   Tobacco comments:    Smoking cessation materials not required  Vaping Use   Vaping Use: Never used  Substance  and Sexual Activity   Alcohol use: No    Alcohol/week: 0.0 standard drinks of alcohol   Drug use: No   Sexual activity: Not Currently  Other Topics Concern   Not on file  Social History Narrative   Pt lives with her son; independent ADL's, no longer drives   Social Determinants of Health   Financial Resource Strain: Low Risk  (05/10/2021)   Overall Financial Resource Strain (CARDIA)    Difficulty of Paying Living Expenses: Not very hard  Food Insecurity: No Food Insecurity (05/10/2021)   Hunger Vital Sign    Worried About Running Out of Food in the Last Year: Never true    Ran Out of Food in the Last Year: Never true  Transportation Needs: No Transportation Needs (05/10/2021)   PRAPARE - THydrologist(Medical): No    Lack of Transportation (Non-Medical): No  Physical Activity: Insufficiently Active (05/10/2021)   Exercise Vital Sign    Days of Exercise per Week: 7 days  Minutes of Exercise per Session: 10 min  Stress: No Stress Concern Present (05/10/2021)   [place]    Feeling of Stress : Not at all  Social Connections: Moderately Integrated (05/10/2021)   Social Connection and Isolation Panel [NHANES]    Frequency of Communication with Friends and Family: More than three times a week    Frequency of Social Gatherings with Friends and Family: Three times a week    Attends Religious Services: More than 4 times per year    Active Member of Clubs or Organizations: Yes    Attends Archivist Meetings: 1 to 4 times per year    Marital Status: Divorced  Intimate Partner Violence: Not At Risk (05/10/2021)   Humiliation, Afraid, Rape, and Kick questionnaire    Fear of Current or Ex-Partner: No    Emotionally Abused: No    Physically Abused: No    Sexually Abused: No    Family History  Problem Relation Age of Onset   Diabetes Mother    Hypertension Mother    Dementia Mother     Hypertension Son      Current Outpatient Medications:    Acetaminophen-Codeine 300-30 MG tablet, 1 TAB EVERY 6 HRS AS NEEDED FOR UP TO 7 DAYS FOR PAIN. AS NEEDED FOR CHRONIC PAIN, Disp: 30 tablet, Rfl: 0   allopurinol (ZYLOPRIM) 100 MG tablet, TAKE 1 TABLET BY MOUTH EVERY DAY, Disp: 90 tablet, Rfl: 1   Blood Glucose Monitoring Suppl (ACCU-CHEK AVIVA PLUS) w/Device KIT, 1 kit by Does not apply route 3 (three) times daily., Disp: , Rfl:    carvedilol (COREG) 25 MG tablet, Take 1 tablet (25 mg total) by mouth 2 (two) times daily. New dose, Disp: 180 tablet, Rfl: 1   dorzolamide-timolol (COSOPT) 22.3-6.8 MG/ML ophthalmic solution, 1 drop 2 (two) times daily., Disp: , Rfl:    ELIQUIS 5 MG TABS tablet, TAKE 1 TABLET BY MOUTH TWICE A DAY, Disp: 180 tablet, Rfl: 1   glucose blood (ACCU-CHEK AVIVA PLUS) test strip, Check fasting glucose once each morning, and check glucose as needed., Disp: 100 each, Rfl: 12   JARDIANCE 25 MG TABS tablet, TAKE 1 TABLET (25 MG TOTAL) BY MOUTH DAILY., Disp: 90 tablet, Rfl: 1   ketoconazole (NIZORAL) 2 % cream, APPLY 1 APPLICATION TOPICALLY DAILY, Disp: 60 g, Rfl: 0   Lancets (ACCU-CHEK MULTICLIX) lancets, Use as instructed, Disp: 204 each, Rfl: 12   Lancets (ACCU-CHEK MULTICLIX) lancets, 1 each by Other route as needed for other. Use as instructed, Disp: , Rfl:    latanoprost (XALATAN) 0.005 % ophthalmic solution, SMARTSIG:1 Drop(s) In Eye(s) Every Evening, Disp: , Rfl:    levothyroxine (SYNTHROID) 50 MCG tablet, Take 1 tablet (50 mcg total) by mouth daily before breakfast. And take two tablets twice a week, Disp: 114 tablet, Rfl: 1   loratadine (CLARITIN) 10 MG tablet, Take 1 tablet (10 mg total) by mouth daily., Disp: 90 tablet, Rfl: 1   metFORMIN (GLUCOPHAGE) 850 MG tablet, Take 1 tablet (850 mg total) by mouth 2 (two) times daily., Disp: 180 tablet, Rfl: 1   MITIGARE 0.6 MG CAPS, TAKE 1-2 CAPSULES (0.6-1.2 MG TOTAL) BY MOUTH DAILY AS NEEDED. FOR GOUT, NOT DAILY, Disp:  30 capsule, Rfl: 0   olmesartan (BENICAR) 40 MG tablet, Take 1 tablet (40 mg total) by mouth daily., Disp: 90 tablet, Rfl: 1   potassium chloride SA (KLOR-CON) 20 MEQ tablet, Take 1 tablet (20 mEq total) by  mouth daily., Disp: 90 tablet, Rfl: 1   pravastatin (PRAVACHOL) 40 MG tablet, Take 1 tablet (40 mg total) by mouth daily., Disp: 90 tablet, Rfl: 1   tacrolimus (PROTOPIC) 0.1 % ointment, APPLY TWICE A DAY TO RASH UNDER BREAST AND IN GROIN FOR 2 WEEKS, THEN DECREASE TO EVERY DAY UNTIL CLEAR, THEN PRN FLARES, Disp: 100 g, Rfl: 2   Travoprost, BAK Free, (TRAVATAN) 0.004 % SOLN ophthalmic solution, Place 1 drop into both eyes at bedtime., Disp: , Rfl:    TRULICITY 3 YT/2.4MQ SOPN, INJECT 3 MG AS DIRECTED ONCE A WEEK., Disp: 6 mL, Rfl: 1   Vitamin D, Ergocalciferol, (DRISDOL) 1.25 MG (50000 UNIT) CAPS capsule, TAKE 1 CAPSULE BY MOUTH ONE TIME PER WEEK, Disp: 12 capsule, Rfl: 1   amLODipine (NORVASC) 10 MG tablet, TAKE 1 TABLET BY MOUTH EVERY DAY, Disp: 90 tablet, Rfl: 1   furosemide (LASIX) 20 MG tablet, TAKE 1 TABLET BY MOUTH EVERY DAY, Disp: 90 tablet, Rfl: 1 No current facility-administered medications for this visit.  Facility-Administered Medications Ordered in Other Visits:    cyanocobalamin ((VITAMIN B-12)) injection 1,000 mcg, 1,000 mcg, Intramuscular, Q30 days, Sindy Guadeloupe, MD, 1,000 mcg at 12/20/20 1414   cyanocobalamin ((VITAMIN B-12)) injection 1,000 mcg, 1,000 mcg, Intramuscular, Q30 days, Sindy Guadeloupe, MD, 1,000 mcg at 05/14/22 1339  Physical exam: There were no vitals filed for this visit. Physical Exam Constitutional:      General: She is not in acute distress. Cardiovascular:     Rate and Rhythm: Normal rate and regular rhythm.     Heart sounds: Normal heart sounds.  Pulmonary:     Effort: Pulmonary effort is normal.     Breath sounds: Normal breath sounds.  Abdominal:     General: Bowel sounds are normal.     Palpations: Abdomen is soft.  Skin:    General: Skin is  warm and dry.  Neurological:     Mental Status: She is alert and oriented to person, place, and time.         Latest Ref Rng & Units 05/09/2022    9:08 AM  CMP  Glucose 70 - 99 mg/dL 132   BUN 8 - 23 mg/dL 13   Creatinine 0.44 - 1.00 mg/dL 0.98   Sodium 135 - 145 mmol/L 137   Potassium 3.5 - 5.1 mmol/L 3.8   Chloride 98 - 111 mmol/L 104   CO2 22 - 32 mmol/L 26   Calcium 8.9 - 10.3 mg/dL 9.8   Total Protein 6.5 - 8.1 g/dL 7.3   Total Bilirubin 0.3 - 1.2 mg/dL 0.6   Alkaline Phos 38 - 126 U/L 53   AST 15 - 41 U/L 14   ALT 0 - 44 U/L 10       Latest Ref Rng & Units 05/09/2022    9:08 AM  CBC  WBC 4.0 - 10.5 K/uL 8.9   Hemoglobin 12.0 - 15.0 g/dL 12.6   Hematocrit 36.0 - 46.0 % 40.7   Platelets 150 - 400 K/uL 274     No images are attached to the encounter.  CT ABDOMEN PELVIS W CONTRAST  Result Date: 05/09/2022 CLINICAL DATA:  Small bowel carcinoid * Tracking Code: BO * EXAM: CT ABDOMEN AND PELVIS WITH CONTRAST TECHNIQUE: Multidetector CT imaging of the abdomen and pelvis was performed using the standard protocol following bolus administration of intravenous contrast. RADIATION DOSE REDUCTION: This exam was performed according to the departmental dose-optimization program which includes automated exposure  control, adjustment of the mA and/or kV according to patient size and/or use of iterative reconstruction technique. CONTRAST:  131m OMNIPAQUE IOHEXOL 300 MG/ML SOLN additional oral enteric contrast COMPARISON:  CT chest abdomen pelvis, 10/25/2021, Dotatate PET-CT, 03/28/2021 FINDINGS: Lower chest: No acute abnormality. Hepatobiliary: No solid liver abnormality is seen. No gallstones, gallbladder wall thickening, or biliary dilatation. Pancreas: Unremarkable. No pancreatic ductal dilatation or surrounding inflammatory changes. Spleen: Normal in size without significant abnormality. Adrenals/Urinary Tract: Stable, definitively benign small left adrenal adenoma (series 2, image 26).  Punctuate, nonobstructive calculus of the inferior pole of the right kidney (series 6, image 62). Bladder is unremarkable. Stomach/Bowel: Stomach is within normal limits. Appendix is not clearly visualized and may be surgically absent. No evidence of bowel wall thickening, distention, or inflammatory changes. Vascular/Lymphatic: Aortic atherosclerosis. No significant change in enlarged small bowel mesenteric lymph nodes in the central right lower quadrant measuring 3.4 x 3.0 cm (series 2, image 59) and 2.5 x 2.4 cm (series 2, image 61). Reproductive: Status post hysterectomy. Other: No abdominal wall hernia or abnormality. No ascites. Musculoskeletal: No acute or significant osseous findings. IMPRESSION: 1. No significant change in enlarged small bowel mesenteric lymph nodes in the central right lower quadrant, previously dotatate PET avid and consistent with nodal carcinoid metastases. 2. No evidence of new metastatic disease in the abdomen or pelvis. 3. Punctuate, nonobstructive calculus of the inferior pole of the right kidney. Aortic Atherosclerosis (ICD10-I70.0). Electronically Signed   By: ADelanna AhmadiM.D.   On: 05/09/2022 10:26     Assessment and plan- Patient is a 88y.o. female .who is here for follow-up of following issues  Carcinoid of the small bowel.  This was diagnosed back in 2020 and has remained stable so far without any treatment.  Her present scans also do not show any progression in disease.  Given the stability of her scans over the last 3 years I will plan to get yearly scans at this time.  B12 deficiency: Patient is not anemic.  B12 levels are low at 214.  I will therefore restart her monthly B12 injections at this time  I will see her back in 6 months with labs   Visit Diagnosis 1. Metastatic carcinoid tumor ([place]   2. B12 deficiency   3. Other iron deficiency anemia      Dr. ARanda Evens MD, MPH [facility]at [id number]/09/2022 4:35  PM

## 2022-05-21 ENCOUNTER — Ambulatory Visit (INDEPENDENT_AMBULATORY_CARE_PROVIDER_SITE_OTHER): Payer: Medicare HMO

## 2022-05-21 DIAGNOSIS — R0601 Orthopnea: Secondary | ICD-10-CM | POA: Diagnosis not present

## 2022-05-21 DIAGNOSIS — R0602 Shortness of breath: Secondary | ICD-10-CM

## 2022-05-21 LAB — ECHOCARDIOGRAM COMPLETE
AR max vel: 1.47 cm2
AV Area VTI: 1.63 cm2
AV Area mean vel: 1.51 cm2
AV Mean grad: 7 mmHg
AV Peak grad: 12.4 mmHg
Ao pk vel: 1.76 m/s
Area-P 1/2: 3.91 cm2
Calc EF: 53.2 %
S' Lateral: 3 cm
Single Plane A2C EF: 55 %
Single Plane A4C EF: 53.6 %

## 2022-05-23 ENCOUNTER — Telehealth: Payer: Self-pay

## 2022-05-23 NOTE — Telephone Encounter (Signed)
-----   Message from [place], PA-C sent at 05/23/2022  9:50 AM EDT ----- Echo showed normal pump function with impaired relaxation with moderately elevated pulmonary pressure. Mild mitral valve disease. Lung pressures mildly better on this echo

## 2022-05-23 NOTE — Telephone Encounter (Signed)
Called to give the patient echo reults. Lmtcb.

## 2022-05-24 NOTE — Telephone Encounter (Signed)
Reviewed results with patient and she verbalized understanding with no further questions at this time.  

## 2022-05-24 NOTE — Telephone Encounter (Signed)
Left voicemail message that I would call back after 3:30 pm today.

## 2022-05-24 NOTE — Telephone Encounter (Signed)
Pt returning nurses call regarding results. Please advise 

## 2022-05-24 NOTE — Telephone Encounter (Signed)
Patient returned call

## 2022-05-27 ENCOUNTER — Other Ambulatory Visit: Payer: Self-pay | Admitting: Family Medicine

## 2022-05-27 DIAGNOSIS — M545 Low back pain, unspecified: Secondary | ICD-10-CM

## 2022-06-06 ENCOUNTER — Other Ambulatory Visit: Payer: Self-pay | Admitting: Family Medicine

## 2022-06-06 DIAGNOSIS — I10 Essential (primary) hypertension: Secondary | ICD-10-CM

## 2022-06-10 ENCOUNTER — Other Ambulatory Visit: Payer: Self-pay | Admitting: Family Medicine

## 2022-06-10 DIAGNOSIS — E1169 Type 2 diabetes mellitus with other specified complication: Secondary | ICD-10-CM

## 2022-06-10 DIAGNOSIS — E1121 Type 2 diabetes mellitus with diabetic nephropathy: Secondary | ICD-10-CM

## 2022-06-10 NOTE — Telephone Encounter (Signed)
Pt has appt 9/15 told to f/u in 6 months

## 2022-06-12 ENCOUNTER — Other Ambulatory Visit: Payer: Self-pay | Admitting: Family Medicine

## 2022-06-12 DIAGNOSIS — M545 Low back pain, unspecified: Secondary | ICD-10-CM

## 2022-06-12 NOTE — Telephone Encounter (Signed)
Requested medications are due for refill today.  unsure  Requested medications are on the active medications list.  yes  Last refill. 02/18/2022 #30 0 refills  Future visit scheduled.   yes  Notes to clinic.  Refill not delegated.    Requested Prescriptions  Pending Prescriptions Disp Refills   acetaminophen-codeine (TYLENOL #3) 300-30 MG tablet [Pharmacy Med Name: ACETAMINOPHEN-COD #3 TABLET] 30 tablet 0    Sig: 1 TAB EVERY 6 HRS AS NEEDED FOR UP TO 7 DAYS FOR PAIN. AS NEEDED FOR CHRONIC PAIN     Not Delegated - Analgesics:  Opioid Agonist Combinations 2 Failed - 06/12/2022  4:33 PM      Failed - This refill cannot be delegated      Failed - Urine Drug Screen completed in last 360 days      Failed - Valid encounter within last 3 months    Recent Outpatient Visits           4 months ago Diabetes mellitus with proteinuric diabetic nephropathy [facility])   [facility] Steele Sizer, MD   8 months ago Diabetes mellitus with proteinuric diabetic nephropathy [facility])   [facility] Steele Sizer, MD   1 year ago Diabetes mellitus with proteinuric diabetic nephropathy [facility])   [facility] Steele Sizer, MD   1 year ago [facility] Steele Sizer, MD   1 year ago Stage 3a chronic kidney disease [facility])   [facility] Steele Sizer, MD       Future Appointments             In 2 weeks Fur[place], [place], PA-C [place], [place]   In 1 month [place], [place], MD [place] in normal range and within 360 days    Creat  Date Value Ref Range Status  03/22/2021 1.23 (H) 0.60 - 0.88 mg/dL Final    Comment:    For patients >59 years of age, the reference limit for Creatinine is approximately 13% higher for people identified as African-American. .    Creatinine, Ser  Date Value Ref Range Status   05/09/2022 0.98 0.44 - 1.00 mg/dL Final   Creatinine, Urine  Date Value Ref Range Status  01/16/2022 29 20 - 275 mg/dL Final         Passed - eGFR is 10 or above and within 360 days    GFR, Est African American  Date Value Ref Range Status  03/22/2021 46 (L) > OR = 60 mL/min/1.43m Final   GFR, Est Non African American  Date Value Ref Range Status  03/22/2021 39 (L) > OR = 60 mL/min/1.777mFinal   GFR, Estimated  Date Value Ref Range Status  05/09/2022 56 (L) >60 mL/min Final    Comment:    (NOTE) Calculated using the CKD-EPI Creatinine Equation (2021)          Passed - Patient is not pregnant

## 2022-06-14 ENCOUNTER — Inpatient Hospital Stay: Payer: Medicare HMO | Attending: Oncology

## 2022-06-14 DIAGNOSIS — Z79899 Other long term (current) drug therapy: Secondary | ICD-10-CM | POA: Insufficient documentation

## 2022-06-14 DIAGNOSIS — D519 Vitamin B12 deficiency anemia, unspecified: Secondary | ICD-10-CM | POA: Insufficient documentation

## 2022-06-14 DIAGNOSIS — D508 Other iron deficiency anemias: Secondary | ICD-10-CM

## 2022-06-14 MED ORDER — CYANOCOBALAMIN 1000 MCG/ML IJ SOLN
1000.0000 ug | INTRAMUSCULAR | Status: DC
  Administered 2022-06-14: 1000 ug via INTRAMUSCULAR
  Filled 2022-06-14: qty 1

## 2022-06-17 ENCOUNTER — Other Ambulatory Visit: Payer: Self-pay | Admitting: Family Medicine

## 2022-06-17 DIAGNOSIS — E1169 Type 2 diabetes mellitus with other specified complication: Secondary | ICD-10-CM

## 2022-06-17 DIAGNOSIS — E1121 Type 2 diabetes mellitus with diabetic nephropathy: Secondary | ICD-10-CM

## 2022-06-17 NOTE — Telephone Encounter (Signed)
Pt last seen in march but told to f/u in 6 months has appt in sept

## 2022-06-26 ENCOUNTER — Encounter: Payer: Self-pay | Admitting: Medical

## 2022-06-26 ENCOUNTER — Ambulatory Visit (INDEPENDENT_AMBULATORY_CARE_PROVIDER_SITE_OTHER): Payer: Medicare HMO | Admitting: Medical

## 2022-06-26 VITALS — BP 136/60 | HR 80 | Ht 65.5 in | Wt 191.0 lb

## 2022-06-26 DIAGNOSIS — R600 Localized edema: Secondary | ICD-10-CM

## 2022-06-26 DIAGNOSIS — E782 Mixed hyperlipidemia: Secondary | ICD-10-CM

## 2022-06-26 DIAGNOSIS — I480 Paroxysmal atrial fibrillation: Secondary | ICD-10-CM

## 2022-06-26 DIAGNOSIS — I10 Essential (primary) hypertension: Secondary | ICD-10-CM

## 2022-06-26 DIAGNOSIS — I5032 Chronic diastolic (congestive) heart failure: Secondary | ICD-10-CM | POA: Diagnosis not present

## 2022-06-26 DIAGNOSIS — I272 Pulmonary hypertension, unspecified: Secondary | ICD-10-CM | POA: Diagnosis not present

## 2022-06-26 DIAGNOSIS — G4733 Obstructive sleep apnea (adult) (pediatric): Secondary | ICD-10-CM | POA: Diagnosis not present

## 2022-06-26 MED ORDER — POTASSIUM CHLORIDE CRYS ER 10 MEQ PO TBCR: 10.0000 meq | EXTENDED_RELEASE_TABLET | Freq: Every day | ORAL | 5 refills | Status: DC

## 2022-06-26 NOTE — Patient Instructions (Signed)
Medication Instructions:  Your physician has recommended you make the following change in your medication:   REDUCE Potassium to 10 meq daily. An Rx has been sent to your pharmacy.  *If you need a refill on your cardiac medications before your next appointment, please call your pharmacy*   Lab Work: None ordered If you have labs (blood work) drawn today and your tests are completely normal, you will receive your results only by: [place] (if you have MyChart) OR A paper copy in the mail If you have any lab test that is abnormal or we need to change your treatment, we will call you to review the results.   Testing/Procedures: None ordered   Follow-Up: At [facility], you and your health needs are our priority.  As part of our continuing mission to provide you with exceptional heart care, we have created designated Provider Care Teams.  These Care Teams include your primary Cardiologist (physician) and Advanced Practice Providers (APPs -  Physician Assistants and Nurse Practitioners) who all work together to provide you with the care you need, when you need it.  We recommend signing up for the patient portal called "MyChart".  Sign up information is provided on this After Visit Summary.  MyChart is used to connect with patients for Virtual Visits (Telemedicine).  Patients are able to view lab/test results, encounter notes, upcoming appointments, etc.  Non-urgent messages can be sent to your provider as well.   To learn more about what you can do with MyChart, go to [email or web address].    Your next appointment:   6 month(s)  The format for your next appointment:   In Person  Provider:   You may see Kathlyn [place], MD or one of the following Advanced Practice Providers on your designated Care Team:   Murray Hodgkins, NP Christell Faith, PA-C Cadence Kathlen Mody, [place]   Other Instructions N/A  Important Information About Sugar

## 2022-06-26 NOTE — Progress Notes (Signed)
Cardiology Office Note:    Date:  06/26/2022   ID:  Susan Bowen, DOB 08-26-1932, MRN [id number]  PCP:  Steele Sizer, MD  [facility] Cardiologist:  Kathlyn [place], MD  [facility] Electrophysiologist:  None   Referring MD: Steele Sizer, MD   Chief Complaint: 2 month follow-up  History of Present Illness:    Susan Bowen is a 88 y.o. female with a hx of paroxysmal Afib, HTN, diabetes, HFpEF, pulmonary HTN, HLD, untreated OSA who presents for 6 month follow-up.    She was hospitalized in May 2019 with chest pain in the setting of Afib RVR. Dose of Coreg was increased and she was started on Eliquis for anticoagulation. Echo showed normal LVSF with G1DD. Lexiscan Myoview in June 2019 showed no ischemia with normal EF.    In 2020 she was diagnosed with carcinoid tumor with metastasis. This was an incidental finding on CT of the abdomen which was doe for renal stone. This has been monitored by oncology without active treatment.    Echo in August 2022 for DOE showed normal LVSF, G1DD and moderate to severe pulmonary HTN. Lasix was increased.    Seen 10/16/21 and reported improvement of SOB, but persistent palpitations. A heart monitor was ordered . This showed NSR with 23 runs of SVT, longest 20 beats, occasional PACs and PVCs.   Last seen 04/22/22 and reports trouble breathing at night. She was referred to Pulmonology for possible sleep study.  Today, the patient reports she cannot take potassium pills because they are too big to swallow. She is supposed to take 7mQ. We will try the 160m. The patient has not seen pulmonology yet. She says breathing is good during the day and at night. She plans on getting her teeth pulled out next month. No chest pain, LLE, orthopnea or pnd. She does have occasional GERD pain. She takes lasix 2069maily.   Past Medical History:  Diagnosis Date   Allergy    Back spasm    Bunion    Carpal tunnel syndrome    Cataracta    Diabetes mellitus  without complication (HCC)    Generalized osteoarthritis    GERD (gastroesophageal reflux disease)    Gout    Hemorrhoids without complication    Hyperlipidemia    Hypertension    Hypothyroidism    Impingement syndrome of right shoulder    Insomnia    Lipoma    Lung nodule    Metastatic carcinoid tumor (HCCFanshawe5/2017   Neuritis or radiculitis due to rupture of lumbar intervertebral disc    Obesity    Osteoporosis    PAF (paroxysmal atrial fibrillation) (HCCGibson  a. 03/2018 AF w/ RVR-->converted spont.  CHA2DS2VASc = 7-->Eliquis.   Proteinuria    Rotator cuff tear    Systolic murmur    a. 5/28/3382ho: EF 60-65%, no rwma, Gr1 DD, mild MR, mildly dil LA. Nl RV fxn. PASP 69m43m   TIA (transient ischemic attack)    TIA (transient ischemic attack) 1998   small TIA.  no residual effects   Tinnitus of both ears    Unspecified glaucoma(365.9)    Vitamin D deficiency    Wedge compression fracture of t11-T12 vertebra, sequela     Past Surgical History:  Procedure Laterality Date   ABDOMINAL HYSTERECTOMY  1986   APPENDECTOMY     BREAST SURGERY Bilateral 1983   biopsy of each side, negative   CARPAL TUNNEL RELEASE     CATARACT EXTRACTION  W/PHACO Left 09/03/2017   Procedure: CATARACT EXTRACTION PHACO AND INTRAOCULAR LENS PLACEMENT (IOC)-LEFT DIABETIC;  Surgeon: Birder Robson, MD;  Location: [facility] ORS;  Service: Ophthalmology;  Laterality: Left;  [place] 01:10.2 AP% 16.7 CDE 11.71 Fluid Pack Lot # [id number]   CATARACT EXTRACTION W/PHACO Right 09/30/2017   Procedure: CATARACT EXTRACTION PHACO AND INTRAOCULAR LENS PLACEMENT (IOC);  Surgeon: Birder Robson, MD;  Location: [facility] ORS;  Service: Ophthalmology;  Laterality: Right;  [place] 01:35.1 AP% 13.1 CDE 12.44 Fluid pack Lot # [id number]   COLON SURGERY     EYE SURGERY Left 09/03/2017   Cataract extraction   FEMUR FRACTURE SURGERY Left    FRACTURE SURGERY  2001   left femur   HEMORROIDECTOMY     polyp removed from vocal cord       Current Medications: Current Meds  Medication Sig   acetaminophen-codeine (TYLENOL #3) 300-30 MG tablet 1 TAB EVERY 6 HRS AS NEEDED FOR UP TO 7 DAYS FOR PAIN. AS NEEDED FOR CHRONIC PAIN   allopurinol (ZYLOPRIM) 100 MG tablet TAKE 1 TABLET BY MOUTH EVERY DAY   amLODipine (NORVASC) 10 MG tablet TAKE 1 TABLET BY MOUTH EVERY DAY   Blood Glucose Monitoring Suppl (ACCU-CHEK AVIVA PLUS) w/Device KIT 1 kit by Does not apply route 3 (three) times daily.   carvedilol (COREG) 25 MG tablet Take 1 tablet (25 mg total) by mouth 2 (two) times daily. New dose   dorzolamide-timolol (COSOPT) 22.3-6.8 MG/ML ophthalmic solution 1 drop 2 (two) times daily.   ELIQUIS 5 MG TABS tablet TAKE 1 TABLET BY MOUTH TWICE A DAY   furosemide (LASIX) 20 MG tablet TAKE 1 TABLET BY MOUTH EVERY DAY   glucose blood (ACCU-CHEK AVIVA PLUS) test strip Check fasting glucose once each morning, and check glucose as needed.   JARDIANCE 25 MG TABS tablet TAKE 1 TABLET (25 MG TOTAL) BY MOUTH DAILY.   ketoconazole (NIZORAL) 2 % cream APPLY 1 APPLICATION TOPICALLY DAILY   Lancets (ACCU-CHEK MULTICLIX) lancets Use as instructed   Lancets (ACCU-CHEK MULTICLIX) lancets 1 each by Other route as needed for other. Use as instructed   latanoprost (XALATAN) 0.005 % ophthalmic solution SMARTSIG:1 Drop(s) In Eye(s) Every Evening   levothyroxine (SYNTHROID) 50 MCG tablet Take 1 tablet (50 mcg total) by mouth daily before breakfast. And take two tablets twice a week   loratadine (CLARITIN) 10 MG tablet Take 1 tablet (10 mg total) by mouth daily.   metFORMIN (GLUCOPHAGE) 850 MG tablet TAKE 1 TABLET BY MOUTH 2 TIMES DAILY.   MITIGARE 0.6 MG CAPS TAKE 1-2 CAPSULES (0.6-1.2 MG TOTAL) BY MOUTH DAILY AS NEEDED. FOR GOUT, NOT DAILY   olmesartan (BENICAR) 40 MG tablet TAKE 1 TABLET BY MOUTH EVERY DAY   pravastatin (PRAVACHOL) 40 MG tablet Take 1 tablet (40 mg total) by mouth daily.   tacrolimus (PROTOPIC) 0.1 % ointment APPLY TWICE A DAY TO RASH UNDER  BREAST AND IN GROIN FOR 2 WEEKS, THEN DECREASE TO EVERY DAY UNTIL CLEAR, THEN PRN FLARES   Travoprost, BAK Free, (TRAVATAN) 0.004 % SOLN ophthalmic solution Place 1 drop into both eyes at bedtime.   TRULICITY 3 FK/8.1EX SOPN INJECT 3 MG AS DIRECTED ONCE A WEEK.   Vitamin D, Ergocalciferol, (DRISDOL) 1.25 MG (50000 UNIT) CAPS capsule TAKE 1 CAPSULE BY MOUTH ONE TIME PER WEEK     Allergies:   Augmentin [amoxicillin-pot clavulanate]   Social History   Socioeconomic History   Marital status: Divorced    Spouse name: Not on file  Number of children: 6   Years of education: Not on file   Highest education level: 10th grade  Occupational History   Not on file  Tobacco Use   Smoking status: Never   Smokeless tobacco: Never   Tobacco comments:    Smoking cessation materials not required  Vaping Use   Vaping Use: Never used  Substance and Sexual Activity   Alcohol use: No    Alcohol/week: 0.0 standard drinks of alcohol   Drug use: No   Sexual activity: Not Currently  Other Topics Concern   Not on file  Social History Narrative   Pt lives with her son; independent ADL's, no longer drives   Social Determinants of Health   Financial Resource Strain: Low Risk  (05/10/2021)   Overall Financial Resource Strain (CARDIA)    Difficulty of Paying Living Expenses: Not very hard  Food Insecurity: No Food Insecurity (05/10/2021)   Hunger Vital Sign    Worried About Running Out of Food in the Last Year: Never true    Ran Out of Food in the Last Year: Never true  Transportation Needs: No Transportation Needs (05/10/2021)   PRAPARE - Hydrologist (Medical): No    Lack of Transportation (Non-Medical): No  Physical Activity: Insufficiently Active (05/10/2021)   Exercise Vital Sign    Days of Exercise per Week: 7 days    Minutes of Exercise per Session: 10 min  Stress: No Stress Concern Present (05/10/2021)   [place]    Feeling of Stress : Not at all  Social Connections: Moderately Integrated (05/10/2021)   Social Connection and Isolation Panel [NHANES]    Frequency of Communication with Friends and Family: More than three times a week    Frequency of Social Gatherings with Friends and Family: Three times a week    Attends Religious Services: More than 4 times per year    Active Member of Clubs or Organizations: Yes    Attends Archivist Meetings: 1 to 4 times per year    Marital Status: Divorced     Family History: The patient's family history includes Dementia in her mother; Diabetes in her mother; Hypertension in her mother and son.  ROS:   Please see the history of present illness.     All other systems reviewed and are negative.  EKGs/Labs/Other Studies Reviewed:    The following studies were reviewed today:  Echo 05/2022 1. Left ventricular ejection fraction, by estimation, is 60 to 65%. The  left ventricle has normal function. The left ventricle has no regional  wall motion abnormalities. Left ventricular diastolic parameters are  consistent with Grade I diastolic  dysfunction (impaired relaxation).   2. Right ventricular systolic function is normal. The right ventricular  size is normal. There is moderately elevated pulmonary artery systolic  pressure. The estimated right ventricular systolic pressure is 82.9 mmHg.   3. Left atrial size was mildly dilated.   4. The mitral valve is normal in structure. Mild mitral valve  regurgitation. No evidence of mitral stenosis.   5. Tricuspid valve regurgitation is mild to moderate.   6. The aortic valve is normal in structure. Aortic valve regurgitation is  not visualized. Aortic valve sclerosis is present, with no evidence of  aortic valve stenosis. Aortic valve area, by VTI measures 1.63 cm. Aortic  valve mean gradient measures 7.0  mmHg.   7. The inferior vena cava is normal in  size with greater than 50%   respiratory variability, suggesting right atrial pressure of 3 mmHg.   Heart monitor 11/2021 Patch Wear Time:  14 days and 0 hours (2022-12-16T15:20:22-0500 to 2022-12-30T15:20:26-0500)   Patient had a min HR of 55 bpm, max HR of 203 bpm, and avg HR of 74 bpm. Predominant underlying rhythm was Sinus Rhythm.  23 Supraventricular Tachycardia runs occurred, the run with the fastest interval lasting 8 beats with a max rate of 203 bpm, the longest lasting 20 beats with an avg rate of 159 bpm. Supraventricular Tachycardia was detected within +/- 45 seconds of symptomatic patient event(s). Occasional PACs and rare PVCs.   Echo 06/2021  1. Left ventricular ejection fraction, by estimation, is 60 to 65%. The  left ventricle has normal function. The left ventricle has no regional  wall motion abnormalities. There is moderate left ventricular hypertrophy.  Left ventricular diastolic  parameters are consistent with Grade I diastolic dysfunction (impaired  relaxation).   2. Right ventricular systolic function is normal. The right ventricular  size is normal. There is severely elevated pulmonary artery systolic  pressure. The estimated right ventricular systolic pressure is 70.1 mmHg.     EKG:  EKG is not  ordered today.    Recent Labs: 01/16/2022: TSH 3.18 05/09/2022: ALT 10; BUN 13; Creatinine, Ser 0.98; Hemoglobin 12.6; Platelets 274; Potassium 3.8; Sodium 137  Recent Lipid Panel    Component Value Date/Time   CHOL 154 01/16/2022 1150   CHOL 146 12/19/2016 0951   TRIG 85 01/16/2022 1150   HDL 66 01/16/2022 1150   HDL 50 12/19/2016 0951   CHOLHDL 2.3 01/16/2022 1150   LDLCALC 71 01/16/2022 1150      Physical Exam:    VS:  BP 136/60 (BP Location: Left Arm, Patient Position: Sitting, Cuff Size: Normal)   Pulse 80   Ht 5' 5.5" (1.664 m)   Wt 191 lb (86.6 kg)   LMP  (LMP Unknown)   SpO2 98%   BMI 31.30 kg/m     Wt Readings from Last 3 Encounters:  06/26/22 191 lb (86.6 kg)  04/22/22  193 lb 6.4 oz (87.7 kg)  01/21/22 192 lb 6.4 oz (87.3 kg)     GEN:  Well nourished, well developed in no acute distress HEENT: Normal NECK: No JVD; No carotid bruits LYMPHATICS: No lymphadenopathy CARDIAC: RRR, no murmurs, rubs, gallops RESPIRATORY:  Clear to auscultation without rales, wheezing or rhonchi  ABDOMEN: Soft, non-tender, non-distended MUSCULOSKELETAL:  No edema; No deformity  SKIN: Warm and dry NEUROLOGIC:  Alert and oriented x 3 PSYCHIATRIC:  Normal affect   ASSESSMENT:    1. Chronic diastolic heart failure ([place])   2. Edema of left lower extremity   3. Pulmonary hypertension, unspecified (HCC)   4. Paroxysmal A-fib ([place])   5. Essential hypertension   6. Hyperlipidemia, mixed   7. OSA (obstructive sleep apnea)    PLAN:    In order of problems listed above:  HFpEF Moderate to severe pulmonary HTN Patient reports stable breathing, and no orthopnea. She is euvolemic on exam today. Recent echo showed LVEF 60-65%, G1DD, moderately elevated pulmonary artery systolic pressure, mild MR, mild tom mod TR. She has been given referral to Pulmonology to use if needed. Continue lasix 27m daily. We will decrease potassium to 1343m daily. She will let usKoreanow if she is able to swallow the pills, may need the packets. Continue Coreg.   Paroxysmal Afib NSR on exam today. Continue Eliquis 43m243mID.  Continue rate control with Coreg.   HTN BP is reasonable, continue Coreg and Amlodipine.   HLD LDL 71, continue Pravastatin.   OSA Mild OSA diagnosed in 2019. She has been given referral to Pulmonology to use as needed for breathing issues.   Disposition: Follow up in 6 month(s) with MD/APP   Signed, Elmer Merwin Ninfa Meeker, PA-C  06/26/2022 2:24 PM    [place]

## 2022-07-07 ENCOUNTER — Other Ambulatory Visit: Payer: Self-pay | Admitting: Family Medicine

## 2022-07-07 DIAGNOSIS — E890 Postprocedural hypothyroidism: Secondary | ICD-10-CM

## 2022-07-15 ENCOUNTER — Inpatient Hospital Stay: Payer: Medicare HMO | Attending: Oncology

## 2022-07-15 DIAGNOSIS — D519 Vitamin B12 deficiency anemia, unspecified: Secondary | ICD-10-CM | POA: Insufficient documentation

## 2022-07-15 DIAGNOSIS — Z79899 Other long term (current) drug therapy: Secondary | ICD-10-CM | POA: Diagnosis not present

## 2022-07-15 DIAGNOSIS — D508 Other iron deficiency anemias: Secondary | ICD-10-CM

## 2022-07-15 MED ORDER — CYANOCOBALAMIN 1000 MCG/ML IJ SOLN
1000.0000 ug | INTRAMUSCULAR | Status: DC
  Administered 2022-07-15: 1000 ug via INTRAMUSCULAR
  Filled 2022-07-15: qty 1

## 2022-07-18 NOTE — Progress Notes (Unsigned)
Name: Susan Bowen   MRN: [id number]    DOB: [birth year]   Date:07/19/2022       Progress Note  Subjective  Chief Complaint  Follow Up  HPI  Thyroid mass: US done in 10/2020 showed, history of thyroid ablation, taking levothyroxine 50 mcg and twice a week she takes two tablets ( Tuesday and Fridays) . Last TSH was normal - last level checked 01/2022  and it was at goal, we will recheck yearly    IMPRESSION: 1. Atrophic and moderately heterogeneous thyroid without worrisome new or enlarging thyroid nodules 2. Dominant right-sided thyroid nodule has decreased in size compared to the 2015 examination. Imaging stability for greater than 5 years is indicative of benign etiology. 3. None of the other discretely measured nodules/pseudo nodules meet imaging criteria to recommend percutaneous sampling or continued dedicated follow-up.  DMII with microalbuminuria and CKI stage III :  she is taking medications Trulicity, Metformin and Jardiance, ARB and statin therapy. She has not been checking her glucose lately   She denies polyphagia, polydipsia or  polyuria.  Her hgbA1C was 10.5%, dropped to 8.5% 7.8%, 7.5% , up to 7.8%  ,8 %  ,  7.8 % , 6.3 % ,6.7 % and today is 6.4 % . LDL was 71   CKI stage III: last GFR stable at 56, she has good urine output and no pruritis Urine micro used to be elevated, but back to normal 01/2022    HTN: she has been taking medication. No chest pain, but occasionally has heart flutter.  She is on Norvasc, Carvedilol and Benicar given by cardiologist. BP is at goal and not side effects of medications at this time . Unchanged    Afib/CHF/pulmonary HTN  : under the care of Dr. Fletcher Anon, rate controlled, she has sob with activity but very seldom, she has mild orthopnea ( uses two pillows) , she still has some palpitation intermittent that happens usually at night but lasts only seconds to minutes and it resolves by itself, she has some lower extremity edema that has been stable,  likely from norvasc, she takes lasix daily . She also has pulmonary hypertension and last Echo. She is on Eliquis , has senile purpura that is stable. EF 60-65 % on Echo done 05/2022    Hyperlipidemia: taking Pravastatin and denies side effects of medications, no myalgia. Last LDL   Aorta atherosclerosis on CT chest: on statin and Eliquis. She states she bruises on arms intermittently, discussed senile purpura and reassurance given    Carcinoid tumor with metastasis: incidental finding on CT abdomen done for evaluation of renal stone back in 03/26/2019, it showed retroperitoneal lymphadenopathy, she has since seen Dr. 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 She is not on therapy just watchful waiting.  She has regular follow ups, states diarrhea  no blood and mucus and not even every week anymore  Appetite is good and weight is now stable.   Anemia secondary to iron  and B12 deficiency: under the care of Dr. Janese Banks , getting B12 injections , last ferritin slightly lower than usual but no anemia. Reviewed labs   Intermittent low back pain: cannot take NSAID's takes tylenol with codeine prn for pain and needs a refill. 30 pills lasting a couple of months. States triggered by activity, pain is int he lower back and no radiculitis . She had a flare about one month ago, she still has about 12 pills left of  Tylenol #3   Trigger finger: going on for months on both hands , both middle fingers. Getting worse over the past couple of months, wakes up with finger flexed and tender. Discussed referral to Ortho or try splinter at bed time   Patient Active Problem List   Diagnosis Date Noted   CHF (congestive heart failure), NYHA class I, chronic, diastolic ([place]) 16/08/9603   Metastasis to intestinal lymph node ([place]) 09/18/2021   Iron deficiency anemia 12/04/2020   Carcinoid tumor, malignant ([place]) 09/08/2019   Pain in joint of left shoulder 02/25/2019   Pulmonary  hypertension, unspecified ([place]) 12/28/2018   Paroxysmal A-fib ([place]) 12/28/2018   Chronic kidney disease, stage III (moderate) (HCC) 04/01/2018   Elevated parathyroid hormone 01/01/2018   Elevated uric acid in blood 04/29/2017   Plantar fasciitis of left foot 03/12/2016   Postprocedural hypothyroidism 01/23/2016   Aortic stenosis 09/26/2015   Allergic rhinitis, seasonal 08/08/2015   Benign hypertension 08/08/2015   History of pneumonia 08/08/2015   Carpal tunnel syndrome 08/08/2015   Cataract 08/08/2015   Insomnia, persistent 08/08/2015   Dyslipidemia 08/08/2015   History of depression 08/08/2015   Glaucoma 08/08/2015   Controlled gout 08/08/2015   Fever blister 08/08/2015   Bilateral hearing loss 08/08/2015   Hemorrhoid 08/08/2015   H/O iron deficiency anemia 08/08/2015   Benign neoplasm of colon 08/08/2015   Impingement syndrome of shoulder 08/08/2015   Osteoporosis, post-menopausal 08/08/2015   Generalized OA 08/08/2015   Multinodular goiter 08/08/2015   Asymptomatic varicose veins 08/08/2015   Polyp of vocal cord 08/08/2015   History of vertebral compression fracture 08/08/2015   Diabetes mellitus with proteinuric diabetic nephropathy ([place]) 08/08/2015   LVH (left ventricular hypertrophy) 08/08/2015   Moderate tricuspid regurgitation 08/08/2015   History of radioactive iodine thyroid ablation 08/08/2015   Lung nodule, solitary 05/12/2014   Chronic cough 05/11/2014   Vitamin D deficiency 12/20/2009   Plantar fascial fibromatosis 12/20/2009   Personal history of fall 07/20/2008   Cervical radiculitis 05/11/2008    Past Surgical History:  Procedure Laterality Date   ABDOMINAL HYSTERECTOMY  1986   APPENDECTOMY     BREAST SURGERY Bilateral 1983   biopsy of each side, negative   CARPAL TUNNEL RELEASE     CATARACT EXTRACTION W/PHACO Left 09/03/2017   Procedure: CATARACT EXTRACTION PHACO AND INTRAOCULAR LENS PLACEMENT (IOC)-LEFT DIABETIC;  Surgeon: Birder Robson, MD;   Location: [facility] ORS;  Service: Ophthalmology;  Laterality: Left;  [place] 01:10.2 AP% 16.7 CDE 11.71 Fluid Pack Lot # [id number]   CATARACT EXTRACTION W/PHACO Right 09/30/2017   Procedure: CATARACT EXTRACTION PHACO AND INTRAOCULAR LENS PLACEMENT (IOC);  Surgeon: Birder Robson, MD;  Location: [facility] OR[facility];  Service: Ophthalmology;  Laterality: Right;  [place] 01:35.1 AP% 13.1 CDE 12.44 Fluid pack Lot # [id number]   COLON SURGERY     EYE SURGERY Left 09/03/2017   Cataract extraction   FEMUR FRACTURE SURGERY Left    FRACTURE SURGERY  2001   left femur   HEMORROIDECTOMY     polyp removed from vocal cord      Family History  Problem Relation Age of Onset   Diabetes Mother    Hypertension Mother    Dementia Mother    Hypertension Son     Social History   Tobacco Use   Smoking status: Never   Smokeless tobacco: Never   Tobacco comments:    Smoking cessation materials not required  Substance Use Topics   Alcohol use: No    Alcohol/week: 0.0 standard  drinks of alcohol     Current Outpatient Medications:    acetaminophen-codeine (TYLENOL #3) 300-30 MG tablet, 1 TAB EVERY 6 HRS AS NEEDED FOR UP TO 7 DAYS FOR PAIN. AS NEEDED FOR CHRONIC PAIN, Disp: 30 tablet, Rfl: 0   allopurinol (ZYLOPRIM) 100 MG tablet, TAKE 1 TABLET BY MOUTH EVERY DAY, Disp: 90 tablet, Rfl: 1   amLODipine (NORVASC) 10 MG tablet, TAKE 1 TABLET BY MOUTH EVERY DAY, Disp: 90 tablet, Rfl: 1   Blood Glucose Monitoring Suppl (ACCU-CHEK AVIVA PLUS) w/Device KIT, 1 kit by Does not apply route 3 (three) times daily., Disp: , Rfl:    carvedilol (COREG) 25 MG tablet, Take 1 tablet (25 mg total) by mouth 2 (two) times daily. New dose, Disp: 180 tablet, Rfl: 1   dorzolamide-timolol (COSOPT) 22.3-6.8 MG/ML ophthalmic solution, 1 drop 2 (two) times daily., Disp: , Rfl:    ELIQUIS 5 MG TABS tablet, TAKE 1 TABLET BY MOUTH TWICE A DAY, Disp: 180 tablet, Rfl: 1   furosemide (LASIX) 20 MG tablet, TAKE 1 TABLET BY MOUTH EVERY DAY, Disp: 90 tablet,  Rfl: 1   glucose blood (ACCU-CHEK AVIVA PLUS) test strip, Check fasting glucose once each morning, and check glucose as needed., Disp: 100 each, Rfl: 12   JARDIANCE 25 MG TABS tablet, TAKE 1 TABLET (25 MG TOTAL) BY MOUTH DAILY., Disp: 90 tablet, Rfl: 1   ketoconazole (NIZORAL) 2 % cream, APPLY 1 APPLICATION TOPICALLY DAILY, Disp: 60 g, Rfl: 0   Lancets (ACCU-CHEK MULTICLIX) lancets, Use as instructed, Disp: 204 each, Rfl: 12   latanoprost (XALATAN) 0.005 % ophthalmic solution, SMARTSIG:1 Drop(s) In Eye(s) Every Evening, Disp: , Rfl:    levothyroxine (SYNTHROID) 50 MCG tablet, TAKE 1 TABLET (50 MCG TOTAL) BY MOUTH DAILY BEFORE BREAKFAST. AND TAKE TWO TABLETS TWICE A WEEK, Disp: 114 tablet, Rfl: 0   loratadine (CLARITIN) 10 MG tablet, Take 1 tablet (10 mg total) by mouth daily., Disp: 90 tablet, Rfl: 1   metFORMIN (GLUCOPHAGE) 850 MG tablet, TAKE 1 TABLET BY MOUTH 2 TIMES DAILY., Disp: 180 tablet, Rfl: 1   MITIGARE 0.6 MG CAPS, TAKE 1-2 CAPSULES (0.6-1.2 MG TOTAL) BY MOUTH DAILY AS NEEDED. FOR GOUT, NOT DAILY, Disp: 30 capsule, Rfl: 0   olmesartan (BENICAR) 40 MG tablet, TAKE 1 TABLET BY MOUTH EVERY DAY, Disp: 90 tablet, Rfl: 0   potassium chloride SA (KLOR-CON M) 10 MEQ tablet, Take 1 tablet (10 mEq total) by mouth daily., Disp: 30 tablet, Rfl: 5   pravastatin (PRAVACHOL) 40 MG tablet, Take 1 tablet (40 mg total) by mouth daily., Disp: 90 tablet, Rfl: 1   tacrolimus (PROTOPIC) 0.1 % ointment, APPLY TWICE A DAY TO RASH UNDER BREAST AND IN GROIN FOR 2 WEEKS, THEN DECREASE TO EVERY DAY UNTIL CLEAR, THEN PRN FLARES, Disp: 100 g, Rfl: 2   Travoprost, BAK Free, (TRAVATAN) 0.004 % SOLN ophthalmic solution, Place 1 drop into both eyes at bedtime., Disp: , Rfl:    TRULICITY 3 WU/9.8JX SOPN, INJECT 3 MG AS DIRECTED ONCE A WEEK., Disp: 6 mL, Rfl: 0   Vitamin D, Ergocalciferol, (DRISDOL) 1.25 MG (50000 UNIT) CAPS capsule, TAKE 1 CAPSULE BY MOUTH ONE TIME PER WEEK, Disp: 12 capsule, Rfl: 1 No current  facility-administered medications for this visit.  Facility-Administered Medications Ordered in Other Visits:    cyanocobalamin ((VITAMIN B-12)) injection 1,000 mcg, 1,000 mcg, Intramuscular, Q30 days, Sindy Guadeloupe, MD, 1,000 mcg at 12/20/20 1414  Allergies  Allergen Reactions   Augmentin [Amoxicillin-Pot Clavulanate] Itching  Has patient had a PCN reaction causing immediate rash, facial/tongue/throat swelling, SOB or lightheadedness with hypotension: No Has patient had a PCN reaction causing severe rash involving mucus membranes or skin necrosis: No Has patient had a PCN reaction that required hospitalization: No Has patient had a PCN reaction occurring within the last 10 years: No If all of the above answers are "NO", then may proceed with Cephalosporin use.     I personally reviewed active problem list, medication list, allergies, family history, social history, health maintenance with the patient/caregiver today.   ROS  Constitutional: Negative for fever or weight change.  Respiratory: Negative for cough and shortness of breath.   Cardiovascular: Negative for chest pain or palpitations.  Gastrointestinal: Negative for abdominal pain, no bowel changes.  Musculoskeletal: Negative for gait problem or joint swelling.  Skin: Negative for rash.  Neurological: Negative for dizziness or headache.  No other specific complaints in a complete review of systems (except as listed in HPI above).   Objective  Vitals:   07/19/22 1050  BP: 122/64  Pulse: 80  Resp: 16  SpO2: 96%  Weight: 192 lb (87.1 kg)  Height: 5' 4"  (1.626 m)    Body mass index is 32.96 kg/m.  Physical Exam  Constitutional: Patient appears well-developed and well-nourished. Obese  No distress.  HEENT: head atraumatic, normocephalic, pupils equal and reactive to light, neck supple Cardiovascular: Normal rate, regular rhythm and normal heart sounds.  No murmur heard. Trace  BLE edema. Pulmonary/Chest: Effort  normal and breath sounds normal. No respiratory distress. Abdominal: Soft.  There is no tenderness. Psychiatric: Patient has a normal mood and affect. behavior is normal. Judgment and thought content normal.   Recent Results (from the past 2160 hour(s))  Vitamin B12     Status: None   Collection Time: 05/09/22  9:06 AM  Result Value Ref Range   Vitamin B-12 214 180 - 914 pg/mL    Comment: (NOTE) This assay is not validated for testing neonatal or myeloproliferative syndrome specimens for Vitamin B12 levels. Performed at [facility], [place] [address], [place], [place] [address]   CBC with Differential/Platelet     Status: None   Collection Time: 05/09/22  9:08 AM  Result Value Ref Range   WBC 8.9 4.0 - 10.5 K/uL   RBC 4.55 3.87 - 5.11 MIL/uL   Hemoglobin 12.6 12.0 - 15.0 g/dL   HCT 40.7 36.0 - 46.0 %   MCV 89.5 80.0 - 100.0 fL   MCH 27.7 26.0 - 34.0 pg   MCHC 31.0 30.0 - 36.0 g/dL   RDW 15.4 11.5 - 15.5 %   Platelets 274 150 - 400 K/uL   nRBC 0.0 0.0 - 0.2 %   Neutrophils Relative % 63 %   Neutro Abs 5.7 1.7 - 7.7 K/uL   Lymphocytes Relative 27 %   Lymphs Abs 2.4 0.7 - 4.0 K/uL   Monocytes Relative 7 %   Monocytes Absolute 0.6 0.1 - 1.0 K/uL   Eosinophils Relative 2 %   Eosinophils Absolute 0.1 0.0 - 0.5 K/uL   Basophils Relative 1 %   Basophils Absolute 0.1 0.0 - 0.1 K/uL   Immature Granulocytes 0 %   Abs Immature Granulocytes 0.02 0.00 - 0.07 K/uL    Comment: Performed at [facility], [address] [place], [place] [address]  Comprehensive metabolic panel     Status: Abnormal   Collection Time: 05/09/22  9:08 AM  Result Value Ref Range  Sodium 137 135 - 145 mmol/L   Potassium 3.8 3.5 - 5.1 mmol/L   Chloride 104 98 - 111 mmol/L   CO2 26 22 - 32 mmol/L   Glucose, Bld 132 (H) 70 - 99 mg/dL    Comment: Glucose reference range applies only to samples taken after fasting for at least 8 hours.   BUN 13 8 - 23 mg/dL   Creatinine, Ser 0.98 0.44 - 1.00 mg/dL    Calcium 9.8 8.9 - 10.3 mg/dL   Total Protein 7.3 6.5 - 8.1 g/dL   Albumin 3.8 3.5 - 5.0 g/dL   AST 14 (L) 15 - 41 U/L   ALT 10 0 - 44 U/L   Alkaline Phosphatase 53 38 - 126 U/L   Total Bilirubin 0.6 0.3 - 1.2 mg/dL   GFR, Estimated 56 (L) >60 mL/min    Comment: (NOTE) Calculated using the CKD-EPI Creatinine Equation (2021)    Anion gap 7 5 - 15    Comment: Performed at [facility], [address] [place],  [address]  ECHOCARDIOGRAM COMPLETE     Status: None   Collection Time: 05/21/22  1:25 PM  Result Value Ref Range   AR max vel 1.47 cm2   AV Peak grad 12.4 mmHg   Ao pk vel 1.76 m/s   S' Lateral 3.00 cm   Area-P 1/2 3.91 cm2   AV Area VTI 1.63 cm2   AV Mean grad 7.0 mmHg   Single Plane A4C EF 53.6 %   Single Plane A2C EF 55.0 %   Calc EF 53.2 %   AV Area mean vel 1.51 cm2  POCT HgB A1C     Status: Abnormal   Collection Time: 07/19/22 10:52 AM  Result Value Ref Range   Hemoglobin A1C 6.4 (A) 4.0 - 5.6 %   HbA1c POC (<> result, manual entry)     HbA1c, POC (prediabetic range)     HbA1c, POC (controlled diabetic range)      Diabetic Foot Exam: Diabetic Foot Exam - Simple   Simple Foot Form Visual Inspection See comments: Yes Sensation Testing Intact to touch and monofilament testing bilaterally: Yes Pulse Check Posterior Tibialis and Dorsalis pulse intact bilaterally: Yes Comments Thick and brittle first toenail on left foot       PHQ2/9:    07/19/2022   10:51 AM 01/16/2022   10:43 AM 09/18/2021   10:33 AM 05/14/2021   10:34 AM 05/10/2021   11:28 AM  Depression screen PHQ 2/9  Decreased Interest 0 0 0 0 0  Down, Depressed, Hopeless 0 0 0 0 0  PHQ - 2 Score 0 0 0 0 0  Altered sleeping 2 0 0    Tired, decreased energy 0 0 0    Change in appetite 0 0 0    Feeling bad or failure about yourself  0 0 0    Trouble concentrating 0 0 0    Moving slowly or fidgety/restless 0 0 0    Suicidal thoughts 0 0 0    PHQ-9 Score 2 0 0      phq 9 is  negative   Fall Risk:    07/19/2022   10:51 AM 01/16/2022   10:43 AM 09/18/2021   10:33 AM 05/14/2021   10:33 AM 05/10/2021   11:32 AM  Fall Risk   Falls in the past year? 0 1 0  0  Number falls in past yr: 0 0 0 0 0  Injury with Fall?  0 0 0 0 0  Risk for fall due to : No Fall Risks No Fall Risks No Fall Risks  No Fall Risks  Follow up Falls prevention discussed Falls prevention discussed Falls prevention discussed Falls evaluation completed Falls prevention discussed      Functional Status Survey: Is the patient deaf or have difficulty hearing?: Yes Does the patient have difficulty seeing, even when wearing glasses/contacts?: Yes Does the patient have difficulty concentrating, remembering, or making decisions?: No Does the patient have difficulty walking or climbing stairs?: No Does the patient have difficulty dressing or bathing?: No Does the patient have difficulty doing errands alone such as visiting a doctor's office or shopping?: No    Assessment & Plan  1. Dyslipidemia associated with type 2 diabetes mellitus (HCC)  - POCT HgB A1C - HM Diabetes Foot Exam - empagliflozin (JARDIANCE) 25 MG TABS tablet; Take 1 tablet (25 mg total) by mouth daily.  Dispense: 90 tablet; Refill: 1 - Dulaglutide (TRULICITY) 3 MQ/2.8MN SOPN; Inject 3 mg as directed once a week.  Dispense: 6 mL; Refill: 1  2. Diabetes mellitus with proteinuric diabetic nephropathy (HCC)  - empagliflozin (JARDIANCE) 25 MG TABS tablet; Take 1 tablet (25 mg total) by mouth daily.  Dispense: 90 tablet; Refill: 1 - Dulaglutide (TRULICITY) 3 OT/7.7NH SOPN; Inject 3 mg as directed once a week.  Dispense: 6 mL; Refill: 1  3. Hypothyroidism, postablative  - levothyroxine (SYNTHROID) 50 MCG tablet; Take 1 tablet (50 mcg total) by mouth daily before breakfast. And take two tablets twice a week  Dispense: 114 tablet; Refill: 0  4. CHF (congestive heart failure), NYHA class I, chronic, diastolic (HCC)   5. Paroxysmal  A-fib ([place])   6. Pulmonary hypertension, unspecified ([place])   7. Malignant carcinoid tumor of small intestine, unspecified location ([place])   8. Stage 3a chronic kidney disease ([place])   9. Metastasis to intestinal lymph node ([place])   10. Need for immunization against influenza  - Flu Vaccine QUAD High Dose([place])  11. Benign hypertension  - olmesartan (BENICAR) 40 MG tablet; Take 1 tablet (40 mg total) by mouth daily.  Dispense: 90 tablet; Refill: 1  12. Dyslipidemia  - pravastatin (PRAVACHOL) 40 MG tablet; Take 1 tablet (40 mg total) by mouth daily.  Dispense: 90 tablet; Refill: 1  13. B12 deficiency   14. Trigger finger, acquired

## 2022-07-19 ENCOUNTER — Encounter: Payer: Self-pay | Admitting: Family Medicine

## 2022-07-19 ENCOUNTER — Ambulatory Visit (INDEPENDENT_AMBULATORY_CARE_PROVIDER_SITE_OTHER): Payer: Medicare HMO | Admitting: Family Medicine

## 2022-07-19 VITALS — BP 122/64 | HR 80 | Resp 16 | Ht 64.0 in | Wt 192.0 lb

## 2022-07-19 DIAGNOSIS — E890 Postprocedural hypothyroidism: Secondary | ICD-10-CM | POA: Diagnosis not present

## 2022-07-19 DIAGNOSIS — I10 Essential (primary) hypertension: Secondary | ICD-10-CM

## 2022-07-19 DIAGNOSIS — M6530 Trigger finger, unspecified finger: Secondary | ICD-10-CM

## 2022-07-19 DIAGNOSIS — I480 Paroxysmal atrial fibrillation: Secondary | ICD-10-CM | POA: Diagnosis not present

## 2022-07-19 DIAGNOSIS — C7A019 Malignant carcinoid tumor of the small intestine, unspecified portion: Secondary | ICD-10-CM

## 2022-07-19 DIAGNOSIS — N1831 Chronic kidney disease, stage 3a: Secondary | ICD-10-CM

## 2022-07-19 DIAGNOSIS — I700 Atherosclerosis of aorta: Secondary | ICD-10-CM | POA: Diagnosis not present

## 2022-07-19 DIAGNOSIS — I272 Pulmonary hypertension, unspecified: Secondary | ICD-10-CM | POA: Diagnosis not present

## 2022-07-19 DIAGNOSIS — E785 Hyperlipidemia, unspecified: Secondary | ICD-10-CM

## 2022-07-19 DIAGNOSIS — E1121 Type 2 diabetes mellitus with diabetic nephropathy: Secondary | ICD-10-CM | POA: Diagnosis not present

## 2022-07-19 DIAGNOSIS — E538 Deficiency of other specified B group vitamins: Secondary | ICD-10-CM

## 2022-07-19 DIAGNOSIS — E1169 Type 2 diabetes mellitus with other specified complication: Secondary | ICD-10-CM | POA: Diagnosis not present

## 2022-07-19 DIAGNOSIS — I5032 Chronic diastolic (congestive) heart failure: Secondary | ICD-10-CM

## 2022-07-19 DIAGNOSIS — Z23 Encounter for immunization: Secondary | ICD-10-CM | POA: Diagnosis not present

## 2022-07-19 DIAGNOSIS — C772 Secondary and unspecified malignant neoplasm of intra-abdominal lymph nodes: Secondary | ICD-10-CM

## 2022-07-19 LAB — POCT GLYCOSYLATED HEMOGLOBIN (HGB A1C): Hemoglobin A1C: 6.4 % — AB (ref 4.0–5.6)

## 2022-07-19 MED ORDER — TRULICITY 3 MG/0.5ML ~~LOC~~ SOAJ: 3.0000 mg | SUBCUTANEOUS | 1 refills | Status: DC

## 2022-07-19 MED ORDER — PRAVASTATIN SODIUM 40 MG PO TABS: 40.0000 mg | ORAL_TABLET | Freq: Every day | ORAL | 1 refills | Status: DC

## 2022-07-19 MED ORDER — EMPAGLIFLOZIN 25 MG PO TABS: 25.0000 mg | ORAL_TABLET | Freq: Every day | ORAL | 1 refills | Status: DC

## 2022-07-19 MED ORDER — OLMESARTAN MEDOXOMIL 40 MG PO TABS: 40.0000 mg | ORAL_TABLET | Freq: Every day | ORAL | 1 refills | Status: DC

## 2022-07-19 MED ORDER — LEVOTHYROXINE SODIUM 50 MCG PO TABS: 50.0000 ug | ORAL_TABLET | Freq: Every day | ORAL | 0 refills | Status: DC

## 2022-08-02 ENCOUNTER — Other Ambulatory Visit: Payer: Self-pay | Admitting: Cardiovascular Disease

## 2022-08-02 DIAGNOSIS — I480 Paroxysmal atrial fibrillation: Secondary | ICD-10-CM

## 2022-08-02 NOTE — Telephone Encounter (Signed)
Refill request

## 2022-08-02 NOTE — Telephone Encounter (Signed)
Prescription refill request for Eliquis received. Indication:Afib Last office visit:8/23 Scr:0.9 Age: 88 Weight:87.1 kg  Prescription refilled

## 2022-08-14 ENCOUNTER — Inpatient Hospital Stay: Payer: Medicare HMO

## 2022-08-15 ENCOUNTER — Inpatient Hospital Stay: Payer: Medicare HMO

## 2022-08-15 ENCOUNTER — Other Ambulatory Visit: Payer: Self-pay | Admitting: Family Medicine

## 2022-08-15 ENCOUNTER — Inpatient Hospital Stay: Payer: Medicare HMO | Attending: Oncology

## 2022-08-15 ENCOUNTER — Other Ambulatory Visit: Payer: Self-pay | Admitting: Podiatry

## 2022-08-15 DIAGNOSIS — D519 Vitamin B12 deficiency anemia, unspecified: Secondary | ICD-10-CM | POA: Insufficient documentation

## 2022-08-15 DIAGNOSIS — D508 Other iron deficiency anemias: Secondary | ICD-10-CM

## 2022-08-15 DIAGNOSIS — Z79899 Other long term (current) drug therapy: Secondary | ICD-10-CM | POA: Insufficient documentation

## 2022-08-15 DIAGNOSIS — I10 Essential (primary) hypertension: Secondary | ICD-10-CM

## 2022-08-15 DIAGNOSIS — R600 Localized edema: Secondary | ICD-10-CM

## 2022-08-15 DIAGNOSIS — E559 Vitamin D deficiency, unspecified: Secondary | ICD-10-CM

## 2022-08-15 DIAGNOSIS — L304 Erythema intertrigo: Secondary | ICD-10-CM

## 2022-08-15 MED ORDER — CYANOCOBALAMIN 1000 MCG/ML IJ SOLN
1000.0000 ug | INTRAMUSCULAR | Status: DC
  Administered 2022-08-15: 1000 ug via INTRAMUSCULAR
  Filled 2022-08-15: qty 1

## 2022-09-08 ENCOUNTER — Other Ambulatory Visit: Payer: Self-pay

## 2022-09-08 ENCOUNTER — Emergency Department: Payer: Medicare HMO

## 2022-09-08 ENCOUNTER — Emergency Department
Admission: EM | Admit: 2022-09-08 | Discharge: 2022-09-08 | Disposition: A | Discharge status: Home / Self Care | Payer: Medicare HMO | Attending: Emergency Medicine | Admitting: Emergency Medicine

## 2022-09-08 DIAGNOSIS — R000 Tachycardia, unspecified: Secondary | ICD-10-CM | POA: Diagnosis not present

## 2022-09-08 DIAGNOSIS — Z7901 Long term (current) use of anticoagulants: Secondary | ICD-10-CM | POA: Insufficient documentation

## 2022-09-08 DIAGNOSIS — I4891 Unspecified atrial fibrillation: Secondary | ICD-10-CM | POA: Diagnosis not present

## 2022-09-08 DIAGNOSIS — R079 Chest pain, unspecified: Secondary | ICD-10-CM | POA: Diagnosis not present

## 2022-09-08 DIAGNOSIS — I130 Hypertensive heart and chronic kidney disease with heart failure and stage 1 through stage 4 chronic kidney disease, or unspecified chronic kidney disease: Secondary | ICD-10-CM | POA: Diagnosis not present

## 2022-09-08 DIAGNOSIS — N189 Chronic kidney disease, unspecified: Secondary | ICD-10-CM | POA: Diagnosis not present

## 2022-09-08 DIAGNOSIS — I509 Heart failure, unspecified: Secondary | ICD-10-CM | POA: Diagnosis not present

## 2022-09-08 DIAGNOSIS — E1122 Type 2 diabetes mellitus with diabetic chronic kidney disease: Secondary | ICD-10-CM | POA: Diagnosis not present

## 2022-09-08 DIAGNOSIS — I499 Cardiac arrhythmia, unspecified: Secondary | ICD-10-CM | POA: Diagnosis not present

## 2022-09-08 DIAGNOSIS — R0789 Other chest pain: Secondary | ICD-10-CM | POA: Diagnosis not present

## 2022-09-08 DIAGNOSIS — R002 Palpitations: Secondary | ICD-10-CM | POA: Diagnosis not present

## 2022-09-08 LAB — BASIC METABOLIC PANEL
Anion gap: 6 (ref 5–15)
BUN: 9 mg/dL (ref 8–23)
CO2: 26 mmol/L (ref 22–32)
Calcium: 9.9 mg/dL (ref 8.9–10.3)
Chloride: 112 mmol/L — ABNORMAL HIGH (ref 98–111)
Creatinine, Ser: 1.1 mg/dL — ABNORMAL HIGH (ref 0.44–1.00)
GFR, Estimated: 48 mL/min — ABNORMAL LOW (ref >60)
Glucose, Bld: 159 mg/dL — ABNORMAL HIGH (ref 70–99)
Potassium: 3.3 mmol/L — ABNORMAL LOW (ref 3.5–5.1)
Sodium: 144 mmol/L (ref 135–145)

## 2022-09-08 LAB — CBC
HCT: 40.9 % (ref 36.0–46.0)
Hemoglobin: 12.8 g/dL (ref 12.0–15.0)
MCH: 27.2 pg (ref 26.0–34.0)
MCHC: 31.3 g/dL (ref 30.0–36.0)
MCV: 87 fL (ref 80.0–100.0)
Platelets: 273 10*3/uL (ref 150–400)
RBC: 4.7 MIL/uL (ref 3.87–5.11)
RDW: 15.9 % — ABNORMAL HIGH (ref 11.5–15.5)
WBC: 9.3 10*3/uL (ref 4.0–10.5)
nRBC: 0 % (ref 0.0–0.2)

## 2022-09-08 LAB — TSH: TSH: 1.602 u[IU]/mL (ref 0.350–4.500)

## 2022-09-08 LAB — MAGNESIUM: Magnesium: 1.4 mg/dL — ABNORMAL LOW (ref 1.7–2.4)

## 2022-09-08 MED ORDER — DILTIAZEM HCL 25 MG/5ML IV SOLN
10.0000 mg | INTRAVENOUS | Status: AC
  Administered 2022-09-08: 10 mg via INTRAVENOUS
  Filled 2022-09-08: qty 5

## 2022-09-08 MED ORDER — DILTIAZEM HCL ER COATED BEADS 120 MG PO CP24: 120.0000 mg | ORAL_CAPSULE | Freq: Every day | ORAL | 0 refills | Status: DC

## 2022-09-08 MED ORDER — MAGNESIUM SULFATE IN D5W 1-5 GM/100ML-% IV SOLN
1.0000 g | Freq: Once | INTRAVENOUS | Status: AC
  Administered 2022-09-08: 1 g via INTRAVENOUS
  Filled 2022-09-08: qty 100

## 2022-09-08 MED ORDER — DILTIAZEM HCL ER COATED BEADS 120 MG PO CP24
120.0000 mg | ORAL_CAPSULE | Freq: Once | ORAL | Status: AC
  Administered 2022-09-08: 120 mg via ORAL
  Filled 2022-09-08: qty 1

## 2022-09-08 MED ORDER — POTASSIUM CHLORIDE 10 MEQ/100ML IV SOLN
10.0000 meq | Freq: Once | INTRAVENOUS | Status: AC
  Administered 2022-09-08: 10 meq via INTRAVENOUS
  Filled 2022-09-08: qty 100

## 2022-09-08 MED ORDER — DILTIAZEM HCL 60 MG PO TABS: 30.0000 mg | ORAL_TABLET | Freq: Once | ORAL | Status: DC

## 2022-09-08 MED ORDER — POTASSIUM CHLORIDE 20 MEQ PO PACK
40.0000 meq | PACK | Freq: Once | ORAL | Status: AC
  Administered 2022-09-08: 40 meq via ORAL
  Filled 2022-09-08: qty 2

## 2022-09-08 NOTE — ED Triage Notes (Signed)
[facility] reports pt coming from home c/o her afib acting up, feels a fluttering in her chest, denies any chest pain.

## 2022-09-08 NOTE — ED Notes (Signed)
Patient and family verbalized understanding of discharge instructions and reasons to return to the ED

## 2022-09-08 NOTE — ED Provider Notes (Signed)
[facility] Provider Note    Event Date/Time   First MD Initiated Contact with Patient 09/08/22 1503     (approximate)   History   Chest Pain   HPI  Susan Bowen is a 88 y.o. female who this afternoon noticed her heart was beating quickly.  No chest pain no trouble breathing.  No leg swelling except for chronic intermittent swelling which is not worsened from typical  Denies shortness of breath.  No difficulty with sleeping.  She reports also similar symptoms that lasted briefly a couple days ago and then went away She does take her blood thinner and is followed by cardiology   Review of primary care note from September 15 of this year denotes a history of type 2 diabetes, chronic kidney disease, hypertension, atrial fibrillation pulmonary hypertension and congestive heart failure (on Eliquis at that time).  Also has carcinoid tumor.   Physical Exam   Triage Vital Signs: ED Triage Vitals  Enc Vitals Group     BP 09/08/22 1444 (!) 149/80     Pulse Rate 09/08/22 1444 (!) 107     Resp 09/08/22 1444 18     Temp 09/08/22 1444 98.2 F (36.8 C)     Temp Source 09/08/22 1444 Oral     SpO2 09/08/22 1444 97 %     Weight 09/08/22 1445 192 lb (87.1 kg)     Height 09/08/22 1445 '5\' 4"'$  (1.626 m)     Head Circumference --      Peak Flow --      Pain Score 09/08/22 1444 0     Pain Loc --      Pain Edu? --      Excl. in [place]? --     Most recent vital signs: Vitals:   09/08/22 1700 09/08/22 1730  BP: 127/89 129/83  Pulse: 81 98  Resp:  18  Temp:    SpO2: 95% 97%    General: Awake, no distress.  Very pleasant in no distress CV:  Good peripheral perfusion.  Normal tones, rapid and irregular rate 1 40-1 50 Resp:  Normal effort.  Clear bilaterally.  Speaks in full sentences without distress Abd:  No distention.  Soft.  No edema. Other:  No notable peripheral edema.  Does not appear volume overloaded   ED Results / Procedures / Treatments    Labs (all labs ordered are listed, but only abnormal results are displayed) Labs Reviewed  CBC - Abnormal; Notable for the following components:      Result Value   RDW 15.9 (*)    All other components within normal limits  BASIC METABOLIC PANEL - Abnormal; Notable for the following components:   Potassium 3.3 (*)    Chloride 112 (*)    Glucose, Bld 159 (*)    Creatinine, Ser 1.10 (*)    GFR, Estimated 48 (*)    All other components within normal limits  MAGNESIUM - Abnormal; Notable for the following components:   Magnesium 1.4 (*)    All other components within normal limits  TSH     EKG  EKG is reviewed inter by me at 1442 heart rate 130 QRS 90 QTc 400 Atrial fibrillation, rapid ventricular response.  Mild nonspecific T wave abnormality.  No STEMI   RADIOLOGY X-ray interpreted by me as for acute finding   PROCEDURES:  Critical Care performed: No  Procedures   MEDICATIONS ORDERED IN ED: Medications  magnesium sulfate IVPB 1 g 100  mL (1 g Intravenous New Bag/Given 09/08/22 1753)  diltiazem (CARDIZEM) injection 10 mg (10 mg Intravenous Given 09/08/22 1523)  potassium chloride 10 mEq in 100 mL IVPB (0 mEq Intravenous Stopped 09/08/22 1756)  potassium chloride (KLOR-CON) packet 40 mEq (40 mEq Oral Given 09/08/22 1632)  diltiazem (CARDIZEM CD) 24 hr capsule 120 mg (120 mg Oral Given 09/08/22 1755)     IMPRESSION / MDM / ASSESSMENT AND PLAN / ED COURSE  I reviewed the triage vital signs and the nursing notes.                              Differential diagnosis includes, but is not limited to, paroxysms of atrial fibrillation, possible underlying electrolyte abnormality, hyperthyroidism, other tachydysrhythmia, etc.  Patient does not have any chest pain.  EKG does not demonstrate any evidence of ischemia.  She has no associated acute pulmonary symptoms.  She is symptomatic however to the palpitations.  After receiving a dose of diltiazem her heart rate has improved.   I consulted with Dr. Harrell Gave from cardiology, advises to start diltiazem 120 mg CD daily having discussed the patient's current regimen medications and clinical history.  Dr. Harrell Gave arrange for patient to follow-up with cardiology this week  Patient's presentation is most consistent with acute complicated illness / injury requiring diagnostic workup.  The patient is on the cardiac monitor to evaluate for evidence of arrhythmia and/or significant heart rate changes.   ----------------------------------------- 6:04 PM on 09/08/2022 ----------------------------------------- Potassium and magnesium repleted.  Patient resting comfortably.  Rate well controlled.  Asymptomatic at this time.  Telemetry shows she does remain in A-fib but rate controlled now with rate variable from about 80-95.  Discussed with patient will start on Cardizem CD daily and will be following close with cardiology.  Careful return precautions discussed and patient in agreement  Return precautions and treatment recommendations and follow-up discussed with the patient who is agreeable with the plan.      FINAL CLINICAL IMPRESSION(S) / ED DIAGNOSES   Final diagnoses:  Atrial fibrillation with rapid ventricular response ([place])     Rx / DC Orders   ED Discharge Orders          Ordered    diltiazem (CARDIZEM CD) 120 MG 24 hr capsule  Daily        09/08/22 1802    Ambulatory referral to Cardiology        09/08/22 1801             Note:  This document was prepared using Dragon voice recognition software and may include unintentional dictation errors.   [name], MD 09/08/22 [phone]

## 2022-09-09 ENCOUNTER — Other Ambulatory Visit: Payer: Self-pay | Admitting: Family Medicine

## 2022-09-09 DIAGNOSIS — M545 Low back pain, unspecified: Secondary | ICD-10-CM

## 2022-09-10 ENCOUNTER — Ambulatory Visit: Payer: Medicare HMO | Attending: Cardiovascular Disease | Admitting: Cardiovascular Disease

## 2022-09-10 ENCOUNTER — Encounter: Payer: Self-pay | Admitting: Cardiovascular Disease

## 2022-09-10 VITALS — BP 110/60 | HR 70 | Ht 64.5 in | Wt 191.1 lb

## 2022-09-10 DIAGNOSIS — I10 Essential (primary) hypertension: Secondary | ICD-10-CM | POA: Diagnosis not present

## 2022-09-10 DIAGNOSIS — I480 Paroxysmal atrial fibrillation: Secondary | ICD-10-CM

## 2022-09-10 DIAGNOSIS — E785 Hyperlipidemia, unspecified: Secondary | ICD-10-CM

## 2022-09-10 DIAGNOSIS — I5032 Chronic diastolic (congestive) heart failure: Secondary | ICD-10-CM | POA: Diagnosis not present

## 2022-09-10 NOTE — Progress Notes (Signed)
Cardiology Office Note   Date:  09/10/2022   ID:  Susan Bowen, DOB Apr 10, 1932, MRN [id number]  PCP:  Steele Sizer, MD  Cardiologist:  Kathlyn Sacramento, MD   Chief Complaint  Patient presents with   Other    Afib c/o left arm sharp pain. Meds reviewed verbally with pt.       History of Present Illness: Susan Bowen is a 88 y.o. female who presents for a follow-up visit regarding paroxysmal atrial fibrillation chronic diastolic heart failure. She has multiple chronic medical conditions that include hypertension, diabetes and hyperlipidemia.   She is not a smoker and has no family history of premature coronary artery disease. She was hospitalized in May, 2019 with chest pain in the setting of A. fib with RVR.  The dose of carvedilol was increased to 12.5 mg twice daily and she was started on Eliquis for anticoagulation.  Echocardiogram showed normal LV systolic function with grade 1 diastolic dysfunction. Lexiscan Myoview in June 2019 showed no evidence of ischemia with normal ejection fraction.  In 2020, she was diagnosed with carcinoid tumor with metastasis.  This was an incidental finding on CT of the abdomen which was done for renal stone .  This has been monitored by oncology without active treatment.  Echocardiogram in August 2022 showed normal LV systolic function, grade 1 diastolic dysfunction and moderate to severe pulmonary hypertension.    She went to the emergency room 2 days ago with palpitations and was noted to be in A-fib with RVR.  She was given 10 mg of IV diltiazem with subsequent improvement in heart rate.  She was discharged home on diltiazem extended release 120 mg once daily.  She does admit that she was not taking her medications regularly before her recent flareup of atrial fibrillation.  She was noted to be hypokalemic and hypomagnesemic.  She feels better now.  She did have some left arm discomfort yesterday but also had some discomfort in her neck.   Symptoms seem to be neuropathic.  No chest pain.  Past Medical History:  Diagnosis Date   Allergy    Back spasm    Bunion    Carpal tunnel syndrome    Cataracta    Diabetes mellitus without complication (HCC)    Generalized osteoarthritis    GERD (gastroesophageal reflux disease)    Gout    Hemorrhoids without complication    Hyperlipidemia    Hypertension    Hypothyroidism    Impingement syndrome of right shoulder    Insomnia    Lipoma    Lung nodule    Metastatic carcinoid tumor ([place]) 03/2016   Neuritis or radiculitis due to rupture of lumbar intervertebral disc    Obesity    Osteoporosis    PAF (paroxysmal atrial fibrillation) ([place])    a. 03/2018 AF w/ RVR-->converted spont.  CHA2DS2VASc = 7-->Eliquis.   Proteinuria    Rotator cuff tear    Systolic murmur    a. 02/980 Echo: EF 60-65%, no rwma, Gr1 DD, mild MR, mildly dil LA. Nl RV fxn. PASP 75mHg.   TIA (transient ischemic attack)    TIA (transient ischemic attack) 1998   small TIA.  no residual effects   Tinnitus of both ears    Unspecified glaucoma(365.9)    Vitamin D deficiency    Wedge compression fracture of t11-T12 vertebra, sequela     Past Surgical History:  Procedure Laterality Date   ABDOMINAL HYSTERECTOMY  1986   APPENDECTOMY  BREAST SURGERY Bilateral 1983   biopsy of each side, negative   CARPAL TUNNEL RELEASE     CATARACT EXTRACTION W/PHACO Left 09/03/2017   Procedure: CATARACT EXTRACTION PHACO AND INTRAOCULAR LENS PLACEMENT (IOC)-LEFT DIABETIC;  Surgeon: Birder Robson, MD;  Location: [facility] ORS;  Service: Ophthalmology;  Laterality: Left;  [place] 01:10.2 AP% 16.7 CDE 11.71 Fluid Pack Lot # [id number]   CATARACT EXTRACTION W/PHACO Right 09/30/2017   Procedure: CATARACT EXTRACTION PHACO AND INTRAOCULAR LENS PLACEMENT (IOC);  Surgeon: Birder Robson, MD;  Location: [facility] ORS;  Service: Ophthalmology;  Laterality: Right;  [place] 01:35.1 AP% 13.1 CDE 12.44 Fluid pack Lot # [id number]   COLON SURGERY      EYE SURGERY Left 09/03/2017   Cataract extraction   FEMUR FRACTURE SURGERY Left    FRACTURE SURGERY  2001   left femur   HEMORROIDECTOMY     polyp removed from vocal cord       Current Outpatient Medications  Medication Sig Dispense Refill   acetaminophen-codeine (TYLENOL #3) 300-30 MG tablet 1 TAB EVERY 6 HRS AS NEEDED FOR UP TO 7 DAYS FOR PAIN. AS NEEDED FOR CHRONIC PAIN 30 tablet 0   allopurinol (ZYLOPRIM) 100 MG tablet TAKE 1 TABLET BY MOUTH EVERY DAY 90 tablet 1   Blood Glucose Monitoring Suppl (ACCU-CHEK AVIVA PLUS) w/Device KIT 1 kit by Does not apply route 3 (three) times daily.     carvedilol (COREG) 25 MG tablet TAKE 1 TABLET (25 MG TOTAL) BY MOUTH 2 (TWO) TIMES DAILY. NEW DOSE 180 tablet 1   diltiazem (CARDIZEM CD) 120 MG 24 hr capsule Take 1 capsule (120 mg total) by mouth daily. 30 capsule 0   dorzolamide-timolol (COSOPT) 22.3-6.8 MG/ML ophthalmic solution 1 drop 2 (two) times daily.     Dulaglutide (TRULICITY) 3 QP/5.9FM SOPN Inject 3 mg as directed once a week. 6 mL 1   ELIQUIS 5 MG TABS tablet TAKE 1 TABLET BY MOUTH TWICE A DAY 180 tablet 1   empagliflozin (JARDIANCE) 25 MG TABS tablet Take 1 tablet (25 mg total) by mouth daily. 90 tablet 1   furosemide (LASIX) 20 MG tablet TAKE 1 TABLET BY MOUTH EVERY DAY 90 tablet 1   glucose blood (ACCU-CHEK AVIVA PLUS) test strip Check fasting glucose once each morning, and check glucose as needed. 100 each 12   ketoconazole (NIZORAL) 2 % cream APPLY 1 APPLICATION TOPICALLY DAILY 60 g 0   Lancets (ACCU-CHEK MULTICLIX) lancets Use as instructed 204 each 12   latanoprost (XALATAN) 0.005 % ophthalmic solution SMARTSIG:1 Drop(s) In Eye(s) Every Evening     levothyroxine (SYNTHROID) 50 MCG tablet Take 1 tablet (50 mcg total) by mouth daily before breakfast. And take two tablets twice a week 114 tablet 0   loratadine (CLARITIN) 10 MG tablet Take 1 tablet (10 mg total) by mouth daily. 90 tablet 1   metFORMIN (GLUCOPHAGE) 850 MG tablet TAKE 1  TABLET BY MOUTH 2 TIMES DAILY. 180 tablet 1   MITIGARE 0.6 MG CAPS TAKE 1-2 CAPSULES (0.6-1.2 MG TOTAL) BY MOUTH DAILY AS NEEDED. FOR GOUT, NOT DAILY 30 capsule 0   olmesartan (BENICAR) 40 MG tablet Take 1 tablet (40 mg total) by mouth daily. 90 tablet 1   potassium chloride SA (KLOR-CON M) 10 MEQ tablet Take 1 tablet (10 mEq total) by mouth daily. 30 tablet 5   pravastatin (PRAVACHOL) 40 MG tablet Take 1 tablet (40 mg total) by mouth daily. 90 tablet 1   tacrolimus (PROTOPIC) 0.1 % ointment APPLY TWICE  A DAY TO RASH UNDER BREAST AND IN GROIN FOR 2 WEEKS, THEN DECREASE TO EVERY DAY UNTIL CLEAR, THEN PRN FLARES 100 g 2   Travoprost, BAK Free, (TRAVATAN) 0.004 % SOLN ophthalmic solution Place 1 drop into both eyes at bedtime.     Vitamin D, Ergocalciferol, (DRISDOL) 1.25 MG (50000 UNIT) CAPS capsule TAKE 1 CAPSULE BY MOUTH ONE TIME PER WEEK 12 capsule 1   No current facility-administered medications for this visit.    Allergies:   Augmentin [amoxicillin-pot clavulanate]    Social History:  The patient  reports that she has never smoked. She has never used smokeless tobacco. She reports that she does not drink alcohol and does not use drugs.   Family History:  The patient's family history includes Dementia in her mother; Diabetes in her mother; Hypertension in her mother and son.    ROS:  Please see the history of present illness.   Otherwise, review of systems are positive for none.   All other systems are reviewed and negative.    PHYSICAL EXAM: VS:  BP 110/60 (BP Location: Left Arm, Patient Position: Sitting, Cuff Size: Large)   Pulse 70   Ht 5' 4.5" (1.638 m)   Wt 191 lb 2 oz (86.7 kg)   LMP  (LMP Unknown)   SpO2 96%   BMI 32.30 kg/m  , BMI Body mass index is 32.3 kg/m. GEN: Well nourished, well developed, in no acute distress  HEENT: normal  Neck: no JVD, carotid bruits, or masses Cardiac: RRR; no  rubs, or gallops.  Mild bilateral leg edema worse on the left side. 2 / 6  systolic murmur in the aortic area Respiratory:  clear to auscultation bilaterally, normal work of breathing GI: soft, nontender, nondistended, + BS MS: no deformity or atrophy  Skin: warm and dry, no rash Neuro:  Strength and sensation are intact Psych: euthymic mood, full affect   EKG:  EKG is ordered today. The ekg ordered today demonstrates normal sinus rhythm with no significant ST or T wave changes.  Low voltage.  Recent Labs: 05/09/2022: ALT 10 09/08/2022: BUN 9; Creatinine, Ser 1.10; Hemoglobin 12.8; Magnesium 1.4; Platelets 273; Potassium 3.3; Sodium 144; TSH 1.602    Lipid Panel    Component Value Date/Time   CHOL 154 01/16/2022 1150   CHOL 146 12/19/2016 0951   TRIG 85 01/16/2022 1150   HDL 66 01/16/2022 1150   HDL 50 12/19/2016 0951   CHOLHDL 2.3 01/16/2022 1150   LDLCALC 71 01/16/2022 1150      Wt Readings from Last 3 Encounters:  09/10/22 191 lb 2 oz (86.7 kg)  09/08/22 192 lb (87.1 kg)  07/19/22 192 lb (87.1 kg)          No data to display            ASSESSMENT AND PLAN:  1.  Paroxysmal atrial fibrillation: She had recent atrial fibrillation but she was not taking her medications regularly.  Since then, diltiazem extended release was added.  She is in sinus rhythm today.  Continue carvedilol as well at 25 mg twice daily.  Continue anticoagulation with Eliquis 5 mg twice daily.  She does have mild chronic kidney disease but her most recent creatinine was 1.1.   The patient will require multiple dental extractions in the near future.  I asked her to hold Eliquis 2 days before and 1 day after.  2. Essential hypertension: Given the recent addition of diltiazem, I elected to discontinue amlodipine to  avoid excessive lower extremity edema.  3. Hyperlipidemia: I reviewed most recent lipid profile showed an LDL of 55.  I recommend continue with pravastatin 40 mg daily.  4.  Chronic diastolic heart failure with moderate severe pulmonary hypertension: Continue  furosemide 20 mg once daily.   Disposition:   FU with me in 3 months   Signed, Kathlyn Sacramento, MD 09/10/22

## 2022-09-10 NOTE — Patient Instructions (Signed)
Medication Instructions:  STOP the Amlodipine  Hold the Eliquis two days prior and one day after tooth extractions.  *If you need a refill on your cardiac medications before your next appointment, please call your pharmacy*   Lab Work: None ordered If you have labs (blood work) drawn today and your tests are completely normal, you will receive your results only by: [place] (if you have MyChart) OR A paper copy in the mail If you have any lab test that is abnormal or we need to change your treatment, we will call you to review the results.   Testing/Procedures: None ordered   Follow-Up: At [facility], you and your health needs are our priority.  As part of our continuing mission to provide you with exceptional heart care, we have created designated Provider Care Teams.  These Care Teams include your primary Cardiologist (physician) and Advanced Practice Providers (APPs -  Physician Assistants and Nurse Practitioners) who all work together to provide you with the care you need, when you need it.  We recommend signing up for the patient portal called "MyChart".  Sign up information is provided on this After Visit Summary.  MyChart is used to connect with patients for Virtual Visits (Telemedicine).  Patients are able to view lab/test results, encounter notes, upcoming appointments, etc.  Non-urgent messages can be sent to your provider as well.   To learn more about what you can do with MyChart, go to [email or web address].    Your next appointment:   3 month(s)  The format for your next appointment:   In Person  Provider:   You may see Kathlyn Sacramento, MD or one of the following Advanced Practice Providers on your designated Care Team:   Murray Hodgkins, NP Christell Faith, PA-C Cadence Kathlen Mody, PA-C Gerrie Nordmann, NP   Important Information About Sugar

## 2022-09-12 DIAGNOSIS — H401132 Primary open-angle glaucoma, bilateral, moderate stage: Secondary | ICD-10-CM | POA: Diagnosis not present

## 2022-09-12 DIAGNOSIS — Z0100 Encounter for examination of eyes and vision without abnormal findings: Secondary | ICD-10-CM | POA: Diagnosis not present

## 2022-09-12 LAB — HM DIABETES EYE EXAM

## 2022-09-13 ENCOUNTER — Other Ambulatory Visit: Payer: Self-pay | Admitting: Ophthalmology

## 2022-09-13 ENCOUNTER — Inpatient Hospital Stay: Payer: Medicare HMO | Attending: Oncology

## 2022-09-13 DIAGNOSIS — H5347 Heteronymous bilateral field defects: Secondary | ICD-10-CM

## 2022-09-13 DIAGNOSIS — D519 Vitamin B12 deficiency anemia, unspecified: Secondary | ICD-10-CM | POA: Diagnosis not present

## 2022-09-13 DIAGNOSIS — Z79899 Other long term (current) drug therapy: Secondary | ICD-10-CM | POA: Insufficient documentation

## 2022-09-13 DIAGNOSIS — D508 Other iron deficiency anemias: Secondary | ICD-10-CM

## 2022-09-13 MED ORDER — CYANOCOBALAMIN 1000 MCG/ML IJ SOLN
1000.0000 ug | INTRAMUSCULAR | Status: DC
  Administered 2022-09-13: 1000 ug via INTRAMUSCULAR
  Filled 2022-09-13: qty 1

## 2022-09-16 ENCOUNTER — Other Ambulatory Visit: Payer: Self-pay | Admitting: Family Medicine

## 2022-09-16 DIAGNOSIS — E1121 Type 2 diabetes mellitus with diabetic nephropathy: Secondary | ICD-10-CM

## 2022-09-16 NOTE — Telephone Encounter (Signed)
Medication Refill - Medication:  Lancets (ACCU-CHEK MULTICLIX) lancets   glucose blood (ACCU-CHEK AVIVA PLUS) test strip   Has the patient contacted their pharmacy? No.  Preferred Pharmacy (with phone number or street name):  [id number] [place] [phone] Fax: [phone]    Has the patient been seen for an appointment in the last year OR does the patient have an upcoming appointment? Yes.    Agent: Please be advised that RX refills may take up to 3 business days. We ask that you follow-up with your pharmacy.

## 2022-09-17 ENCOUNTER — Ambulatory Visit: Payer: Medicare HMO | Admitting: Cardiology

## 2022-09-17 MED ORDER — ACCU-CHEK MULTICLIX LANCETS MISC: 12 refills | Status: DC

## 2022-09-17 MED ORDER — ACCU-CHEK AVIVA PLUS VI STRP: ORAL_STRIP | 12 refills | Status: DC

## 2022-09-17 NOTE — Telephone Encounter (Signed)
Requested Prescriptions  Pending Prescriptions Disp Refills   Lancets (ACCU-CHEK MULTICLIX) lancets 204 each 12    Sig: Use as instructed     Endocrinology: Diabetes - Testing Supplies Passed - 09/16/2022  1:16 PM      Passed - Valid encounter within last 12 months    Recent Outpatient Visits           2 months ago Dyslipidemia associated with type 2 diabetes mellitus [facility])   [facility] [place], Drue Stager, MD   8 months ago Diabetes mellitus with proteinuric diabetic nephropathy [facility])   [facility] [place], Drue Stager, MD   12 months ago Diabetes mellitus with proteinuric diabetic nephropathy [facility])   [facility] Steele Sizer, MD   1 year ago Diabetes mellitus with proteinuric diabetic nephropathy [facility])   [facility] Steele Sizer, MD   1 year ago [facility] Steele Sizer, MD       Future Appointments             In 3 months Fletcher Anon, Mertie Clause, MD [place]. [place]   In 4 months [place], Drue Stager, MD [facility]             glucose blood (ACCU-CHEK AVIVA PLUS) test strip 100 each 12    Sig: Check fasting glucose once each morning, and check glucose as needed.     Endocrinology: Diabetes - Testing Supplies Passed - 09/16/2022  1:16 PM      Passed - Valid encounter within last 12 months    Recent Outpatient Visits           2 months ago Dyslipidemia associated with type 2 diabetes mellitus [facility])   [facility] Steele Sizer, MD   8 months ago Diabetes mellitus with proteinuric diabetic nephropathy [facility])   [facility] Steele Sizer, MD   12 months ago Diabetes mellitus with proteinuric diabetic nephropathy [facility])   [facility] Steele Sizer, MD   1 year ago Diabetes mellitus with proteinuric diabetic nephropathy [facility])   [facility] Steele Sizer, MD   1 year ago [facility] Steele Sizer, MD       Future Appointments             In 3 months Fletcher Anon, Mertie Clause, MD [place]. [place]   In 4 months Ancil Boozer, Drue Stager, MD [place]

## 2022-09-19 ENCOUNTER — Ambulatory Visit
Admission: RE | Admit: 2022-09-19 | Discharge: 2022-09-19 | Disposition: A | Discharge status: Home / Self Care | Payer: Medicare HMO | Source: Ambulatory Visit | Attending: Ophthalmology | Admitting: Ophthalmology

## 2022-09-19 DIAGNOSIS — H5347 Heteronymous bilateral field defects: Secondary | ICD-10-CM

## 2022-09-19 DIAGNOSIS — R519 Headache, unspecified: Secondary | ICD-10-CM | POA: Diagnosis not present

## 2022-09-19 MED ORDER — GADOBUTROL 1 MMOL/ML IV SOLN
9.0000 mL | Freq: Once | INTRAVENOUS | Status: AC | PRN
  Administered 2022-09-19: 9 mL via INTRAVENOUS

## 2022-10-10 ENCOUNTER — Telehealth: Payer: Self-pay | Admitting: Cardiovascular Disease

## 2022-10-10 ENCOUNTER — Other Ambulatory Visit: Payer: Self-pay

## 2022-10-10 MED ORDER — DILTIAZEM HCL ER COATED BEADS 120 MG PO CP24: 120.0000 mg | ORAL_CAPSULE | Freq: Every day | ORAL | 2 refills | Status: DC

## 2022-10-10 NOTE — Telephone Encounter (Signed)
*  STAT* If patient is at the pharmacy, call can be transferred to refill team.   1. Which medications need to be refilled? (please list name of each medication and dose if known) diltiazem (CARDIZEM CD) 120 MG 24 hr capsule   2. Which pharmacy/location (including street and city if local pharmacy) is medication to be sent to[id number] [place]  3. Do they need a 30 day or 90 day supply? 90 day

## 2022-10-13 DIAGNOSIS — R58 Hemorrhage, not elsewhere classified: Secondary | ICD-10-CM | POA: Diagnosis not present

## 2022-10-13 DIAGNOSIS — R030 Elevated blood-pressure reading, without diagnosis of hypertension: Secondary | ICD-10-CM | POA: Insufficient documentation

## 2022-10-13 DIAGNOSIS — K068 Other specified disorders of gingiva and edentulous alveolar ridge: Secondary | ICD-10-CM | POA: Diagnosis present

## 2022-10-13 DIAGNOSIS — S0181XA Laceration without foreign body of other part of head, initial encounter: Secondary | ICD-10-CM | POA: Diagnosis not present

## 2022-10-13 LAB — CBC WITH DIFFERENTIAL/PLATELET
Abs Immature Granulocytes: 0.02 10*3/uL (ref 0.00–0.07)
Basophils Absolute: 0 10*3/uL (ref 0.0–0.1)
Basophils Relative: 1 %
Eosinophils Absolute: 0.2 10*3/uL (ref 0.0–0.5)
Eosinophils Relative: 3 %
HCT: 42.5 % (ref 36.0–46.0)
Hemoglobin: 12.9 g/dL (ref 12.0–15.0)
Immature Granulocytes: 0 %
Lymphocytes Relative: 40 %
Lymphs Abs: 3.6 10*3/uL (ref 0.7–4.0)
MCH: 27.4 pg (ref 26.0–34.0)
MCHC: 30.4 g/dL (ref 30.0–36.0)
MCV: 90.4 fL (ref 80.0–100.0)
Monocytes Absolute: 0.6 10*3/uL (ref 0.1–1.0)
Monocytes Relative: 7 %
Neutro Abs: 4.3 10*3/uL (ref 1.7–7.7)
Neutrophils Relative %: 49 %
Platelets: 255 10*3/uL (ref 150–400)
RBC: 4.7 MIL/uL (ref 3.87–5.11)
RDW: 15 % (ref 11.5–15.5)
WBC: 8.8 10*3/uL (ref 4.0–10.5)
nRBC: 0 % (ref 0.0–0.2)

## 2022-10-13 LAB — COMPREHENSIVE METABOLIC PANEL
ALT: 8 U/L (ref 0–44)
AST: 11 U/L — ABNORMAL LOW (ref 15–41)
Albumin: 3.6 g/dL (ref 3.5–5.0)
Alkaline Phosphatase: 55 U/L (ref 38–126)
Anion gap: 8 (ref 5–15)
BUN: 17 mg/dL (ref 8–23)
CO2: 23 mmol/L (ref 22–32)
Calcium: 9.6 mg/dL (ref 8.9–10.3)
Chloride: 108 mmol/L (ref 98–111)
Creatinine, Ser: 1.16 mg/dL — ABNORMAL HIGH (ref 0.44–1.00)
GFR, Estimated: 45 mL/min — ABNORMAL LOW (ref >60)
Glucose, Bld: 132 mg/dL — ABNORMAL HIGH (ref 70–99)
Potassium: 4.1 mmol/L (ref 3.5–5.1)
Sodium: 139 mmol/L (ref 135–145)
Total Bilirubin: 0.5 mg/dL (ref 0.3–1.2)
Total Protein: 7 g/dL (ref 6.5–8.1)

## 2022-10-13 LAB — PROTIME-INR
INR: 1.2 (ref 0.8–1.2)
Prothrombin Time: 15.1 seconds (ref 11.4–15.2)

## 2022-10-13 NOTE — ED Triage Notes (Signed)
Pt sts that on this past Monday she had five teeth pulled than tonight pt started to have bleeding. Pt is spitting in a cut that is saturated with bloody tissues.

## 2022-10-14 ENCOUNTER — Inpatient Hospital Stay: Payer: Medicare HMO | Attending: Oncology

## 2022-10-14 ENCOUNTER — Inpatient Hospital Stay: Payer: Medicare HMO

## 2022-10-14 ENCOUNTER — Emergency Department
Admission: EM | Admit: 2022-10-14 | Discharge: 2022-10-14 | Disposition: A | Discharge status: Home / Self Care | Payer: Medicare HMO | Attending: Emergency Medicine | Admitting: Emergency Medicine

## 2022-10-14 DIAGNOSIS — R58 Hemorrhage, not elsewhere classified: Secondary | ICD-10-CM | POA: Diagnosis not present

## 2022-10-14 DIAGNOSIS — D519 Vitamin B12 deficiency anemia, unspecified: Secondary | ICD-10-CM | POA: Diagnosis not present

## 2022-10-14 DIAGNOSIS — D508 Other iron deficiency anemias: Secondary | ICD-10-CM

## 2022-10-14 DIAGNOSIS — Z79899 Other long term (current) drug therapy: Secondary | ICD-10-CM | POA: Insufficient documentation

## 2022-10-14 MED ORDER — LIDOCAINE-EPINEPHRINE 2 %-1:100000 IJ SOLN
20.0000 mL | Freq: Once | INTRAMUSCULAR | Status: AC
  Administered 2022-10-14: 20 mL via INTRADERMAL
  Filled 2022-10-14: qty 1

## 2022-10-14 MED ORDER — CEPHALEXIN 500 MG PO CAPS: 500.0000 mg | ORAL_CAPSULE | Freq: Three times a day (TID) | ORAL | 0 refills | Status: AC

## 2022-10-14 MED ORDER — GELATIN ABSORBABLE 12-7 MM EX MISC
1.0000 | Freq: Once | CUTANEOUS | Status: AC
  Administered 2022-10-14: 1 via TOPICAL
  Filled 2022-10-14: qty 1

## 2022-10-14 MED ORDER — CYANOCOBALAMIN 1000 MCG/ML IJ SOLN
1000.0000 ug | INTRAMUSCULAR | Status: AC
  Administered 2022-10-14: 1000 ug via INTRAMUSCULAR
  Filled 2022-10-14: qty 1

## 2022-10-14 MED ORDER — "TRANEXAMIC ACID 5% ORAL SOLUTION "
10.0000 mL | Freq: Once | ORAL | Status: AC
  Administered 2022-10-14: 10 mL via OROMUCOSAL
  Filled 2022-10-14: qty 10

## 2022-10-14 NOTE — ED Provider Notes (Signed)
[facility] Provider Note    Event Date/Time   First MD Initiated Contact with Patient 10/14/22 [id number]     (approximate)   History   Dental Problem   HPI  Susan Bowen is a 88 y.o. female here with dental bleeding.  The patient is on Eliquis for history of A-fib.  She had 5 teeth pulled on Monday.  She states initially there was minimal bleeding.  Earlier this afternoon, she began experiencing severe bleeding from a spot on the right upper gums.  She has been unable to get it to stop.  She has tried direct pressure.  She did take her Eliquis today.  Denies any trauma to the area.  Denies any fevers or chills.  No significant pain.  No other medical complaints.     Physical Exam   Triage Vital Signs: ED Triage Vitals [10/13/22 2200]  Enc Vitals Group     BP (!) 175/79     Pulse Rate 70     Resp 17     Temp 98.4 F (36.9 C)     Temp Source Axillary     SpO2 94 %     Weight 191 lb (86.6 kg)     Height      Head Circumference      Peak Flow      Pain Score      Pain Loc      Pain Edu?      Excl. in [place]?     Most recent vital signs: Vitals:   10/14/22 0430 10/14/22 0500  BP: (!) 158/80   Pulse: 63   Resp: 20   Temp:  98 F (36.7 C)  SpO2: 99%      General: Awake, no distress.  CV:  Good peripheral perfusion.  Resp:  Normal effort.  Abd:  No distention.  Other:  S/p extraction of right upper lateral incisors and premolars.  Patient has an open, actively bleeding area of recent extraction along where the right canine likely was.  No pulsatile bleeding but bleeding does appear brisk.   ED Results / Procedures / Treatments   Labs (all labs ordered are listed, but only abnormal results are displayed) Labs Reviewed  COMPREHENSIVE METABOLIC PANEL - Abnormal; Notable for the following components:      Result Value   Glucose, Bld 132 (*)    Creatinine, Ser 1.16 (*)    AST 11 (*)    GFR, Estimated 45 (*)    All other components within  normal limits  CBC WITH DIFFERENTIAL/PLATELET  PROTIME-INR     EKG    RADIOLOGY    I also independently reviewed and agree with radiologist interpretations.   PROCEDURES:  Critical Care performed: No  ..Laceration Repair  Date/Time: 10/14/2022 5:26 AM  Performed by: Duffy Bruce, MD Authorized by: Duffy Bruce, MD   Consent:    Consent obtained:  Verbal   Risks discussed:  Infection, need for additional repair, nerve damage, pain, poor cosmetic result, poor wound healing, retained foreign body and vascular damage   Alternatives discussed:  No treatment Universal protocol:    Patient identity confirmed:  Verbally with patient Anesthesia:    Anesthesia method:  Local infiltration   Local anesthetic:  Lidocaine 2% WITH epi Laceration details:    Location: Upper jaw.   Length (cm):  0.5 Skin repair:    Repair method:  Sutures   Suture size:  4-0   Wound skin closure material used:  Monocryl.   Suture technique:  Simple interrupted   Number of sutures:  2 Approximation:    Approximation:  Close Repair type:    Repair type:  Simple Post-procedure details:    Procedure completion:  Tolerated well, no immediate complications     MEDICATIONS ORDERED IN ED: Medications  tranexamic acid 5% oral rinse solution (10 mLs Mouth/Throat Given 10/14/22 0226)  gelatin adsorbable (GELFOAM/SURGIFOAM) sponge 12-7 mm 1 each (1 each Topical Given by Other 10/14/22 0404)  lidocaine-EPINEPHrine (XYLOCAINE W/EPI) 2 %-1:100000 (with pres) injection 20 mL (20 mLs Intradermal Given by Other 10/14/22 0405)     IMPRESSION / MDM / ASSESSMENT AND PLAN / ED COURSE  I reviewed the triage vital signs and the nursing notes.                              Patient's presentation is most consistent with acute presentation with potential threat to life or bodily function.   88 year old female with history of A-fib on Eliquis here with dental pain and bleeding.  Suspect patient had a  clot formed which healed and fell off today with subsequent bleeding.  Patient has stable hemoglobin compared to baseline.  CMP unremarkable.  She had significant brisk bleeding despite topical applications of Surgicel and TXA soaked gauze.  Decision subsequently made to anesthetize the area then close with observable sutures.  Will start on empiric antibiotics.  Following closure, patient had no further bleeding.  I did instruct her to hold her anticoagulant for 48 hours.  Feel the risks of recurrent bleeding outweigh the benefits at this time.  She has a history of A-fib but no history of valve replacement, stroke, or other complications.  Return precautions given.  FINAL CLINICAL IMPRESSION(S) / ED DIAGNOSES   Final diagnoses:  Bleeding     Rx / DC Orders   ED Discharge Orders          Ordered    cephALEXin (KEFLEX) 500 MG capsule  3 times daily        10/14/22 0449             Note:  This document was prepared using Dragon voice recognition software and may include unintentional dictation errors.   Duffy Bruce, MD 10/14/22 [phone]

## 2022-10-14 NOTE — ED Notes (Signed)
No active bleeding to teeth noted at this time.

## 2022-10-14 NOTE — Discharge Instructions (Addendum)
STOP your ELIQUIS for the next 2 days, then resume  IF bleeding returns, apply direct pressure with a gauze or towel for 15 minutes. You can swish and spit with ice cold water as well.  RESUME your eliquis after 2 days  TAKE the Cephalex antibiotic for 5 days  Follow-up with your dentist in 2-3 days

## 2022-10-18 ENCOUNTER — Emergency Department
Admission: EM | Admit: 2022-10-18 | Discharge: 2022-10-19 | Disposition: A | Discharge status: Home / Self Care | Payer: Medicare HMO | Attending: Emergency Medicine | Admitting: Emergency Medicine

## 2022-10-18 DIAGNOSIS — K068 Other specified disorders of gingiva and edentulous alveolar ridge: Secondary | ICD-10-CM | POA: Diagnosis not present

## 2022-10-18 DIAGNOSIS — Z7901 Long term (current) use of anticoagulants: Secondary | ICD-10-CM | POA: Diagnosis not present

## 2022-10-18 DIAGNOSIS — E039 Hypothyroidism, unspecified: Secondary | ICD-10-CM | POA: Diagnosis not present

## 2022-10-18 DIAGNOSIS — K0889 Other specified disorders of teeth and supporting structures: Secondary | ICD-10-CM | POA: Diagnosis present

## 2022-10-18 DIAGNOSIS — I10 Essential (primary) hypertension: Secondary | ICD-10-CM | POA: Diagnosis not present

## 2022-10-18 DIAGNOSIS — E119 Type 2 diabetes mellitus without complications: Secondary | ICD-10-CM | POA: Insufficient documentation

## 2022-10-18 DIAGNOSIS — Z7984 Long term (current) use of oral hypoglycemic drugs: Secondary | ICD-10-CM | POA: Insufficient documentation

## 2022-10-18 DIAGNOSIS — Z79899 Other long term (current) drug therapy: Secondary | ICD-10-CM | POA: Diagnosis not present

## 2022-10-18 LAB — APTT: aPTT: 37 seconds — ABNORMAL HIGH (ref 24–36)

## 2022-10-18 LAB — PROTIME-INR
INR: 1.3 — ABNORMAL HIGH (ref 0.8–1.2)
Prothrombin Time: 15.7 seconds — ABNORMAL HIGH (ref 11.4–15.2)

## 2022-10-18 LAB — CBC WITH DIFFERENTIAL/PLATELET
Abs Immature Granulocytes: 0.02 10*3/uL (ref 0.00–0.07)
Basophils Absolute: 0.1 10*3/uL (ref 0.0–0.1)
Basophils Relative: 1 %
Eosinophils Absolute: 0.2 10*3/uL (ref 0.0–0.5)
Eosinophils Relative: 2 %
HCT: 37.5 % (ref 36.0–46.0)
Hemoglobin: 11.5 g/dL — ABNORMAL LOW (ref 12.0–15.0)
Immature Granulocytes: 0 %
Lymphocytes Relative: 36 %
Lymphs Abs: 3.2 10*3/uL (ref 0.7–4.0)
MCH: 27.6 pg (ref 26.0–34.0)
MCHC: 30.7 g/dL (ref 30.0–36.0)
MCV: 89.9 fL (ref 80.0–100.0)
Monocytes Absolute: 0.5 10*3/uL (ref 0.1–1.0)
Monocytes Relative: 5 %
Neutro Abs: 5.1 10*3/uL (ref 1.7–7.7)
Neutrophils Relative %: 56 %
Platelets: 246 10*3/uL (ref 150–400)
RBC: 4.17 MIL/uL (ref 3.87–5.11)
RDW: 15.1 % (ref 11.5–15.5)
WBC: 9 10*3/uL (ref 4.0–10.5)
nRBC: 0 % (ref 0.0–0.2)

## 2022-10-18 NOTE — ED Triage Notes (Signed)
Pt reports recent dental procedure earlier this month with several teeth extracted. Pt reports dental/mouth bleeding, was here a few days ago for same. Pt has 4 stiches placed last Sunday night, tonight around 5:30, bleeding started again. Denis current dental pain, Pt reports is on Eliquis, Stopped Sunday night, but restarted Wed morning. Soaking through several guaze, pressure being held at this time to help control bleeding.

## 2022-10-18 NOTE — ED Provider Notes (Signed)
[facility] Provider Note    Event Date/Time   First MD Initiated Contact with Patient 10/18/22 2302     (approximate)   History   Dental Pain and Dental Bleeding   HPI  Susan Bowen is a 88 y.o. female with history of A-fib on Eliquis who presents to the emergency department with upper gum bleeding.  Had 5 teeth extracted on 10/07/2022.  Had to come to the ED on 10/14/2022 for gum bleeding and had to have sutures placed.  Family reports she thinks one of her sutures may have open tonight and started bleeding again.  She has held her Eliquis intermittently but has been back on it for the past 2 days.   History provided by patient and family.    Past Medical History:  Diagnosis Date   Allergy    Back spasm    Bunion    Carpal tunnel syndrome    Cataracta    Diabetes mellitus without complication (HCC)    Generalized osteoarthritis    GERD (gastroesophageal reflux disease)    Gout    Hemorrhoids without complication    Hyperlipidemia    Hypertension    Hypothyroidism    Impingement syndrome of right shoulder    Insomnia    Lipoma    Lung nodule    Metastatic carcinoid tumor ([place]) 03/2016   Neuritis or radiculitis due to rupture of lumbar intervertebral disc    Obesity    Osteoporosis    PAF (paroxysmal atrial fibrillation) ([place])    a. 03/2018 AF w/ RVR-->converted spont.  CHA2DS2VASc = 7-->Eliquis.   Proteinuria    Rotator cuff tear    Systolic murmur    a. 02/1582 Echo: EF 60-65%, no rwma, Gr1 DD, mild MR, mildly dil LA. Nl RV fxn. PASP 25mHg.   TIA (transient ischemic attack)    TIA (transient ischemic attack) 1998   small TIA.  no residual effects   Tinnitus of both ears    Unspecified glaucoma(365.9)    Vitamin D deficiency    Wedge compression fracture of t11-T12 vertebra, sequela     Past Surgical History:  Procedure Laterality Date   ABDOMINAL HYSTERECTOMY  1986   APPENDECTOMY     BREAST SURGERY Bilateral 1983   biopsy of  each side, negative   CARPAL TUNNEL RELEASE     CATARACT EXTRACTION W/PHACO Left 09/03/2017   Procedure: CATARACT EXTRACTION PHACO AND INTRAOCULAR LENS PLACEMENT (IOC)-LEFT DIABETIC;  Surgeon: PBirder Robson MD;  Location: [facility] ORS;  Service: Ophthalmology;  Laterality: Left;  [place]:10.2 AP% 16.7 CDE 11.71 Fluid Pack Lot # [id number]  CATARACT EXTRACTION W/PHACO Right 09/30/2017   Procedure: CATARACT EXTRACTION PHACO AND INTRAOCULAR LENS PLACEMENT (IOC);  Surgeon: PBirder Robson MD;  Location: [facility] ORS;  Service: Ophthalmology;  Laterality: Right;  [place]:35.1 AP% 13.1 CDE 12.44 Fluid pack Lot # [id number]   COLON SURGERY     EYE SURGERY Left 09/03/2017   Cataract extraction   FEMUR FRACTURE SURGERY Left    FRACTURE SURGERY  2001   left femur   HEMORROIDECTOMY     polyp removed from vocal cord      MEDICATIONS:  Prior to Admission medications   Medication Sig Start Date End Date Taking? Authorizing Provider  acetaminophen-codeine (TYLENOL #3) 300-30 MG tablet 1 TAB EVERY 6 HRS AS NEEDED FOR UP TO 7 DAYS FOR PAIN. AS NEEDED FOR CHRONIC PAIN 09/11/22   SSteele Sizer MD  allopurinol (ZYLOPRIM) 100 MG tablet  TAKE 1 TABLET BY MOUTH EVERY DAY 01/04/22   Edrick Kins, DPM  Blood Glucose Monitoring Suppl (ACCU-CHEK AVIVA PLUS) w/Device KIT 1 kit by Does not apply route 3 (three) times daily.    [provider]  carvedilol (COREG) 25 MG tablet TAKE 1 TABLET (25 MG TOTAL) BY MOUTH 2 (TWO) TIMES DAILY. NEW DOSE 08/16/22   Steele Sizer, MD  cephALEXin (KEFLEX) 500 MG capsule Take 1 capsule (500 mg total) by mouth 3 (three) times daily for 5 days. 10/14/22 10/19/22  Duffy Bruce, MD  diltiazem (CARDIZEM CD) 120 MG 24 hr capsule Take 1 capsule (120 mg total) by mouth daily. 10/10/22   Wellington [place], MD  dorzolamide-timolol (COSOPT) 22.3-6.8 MG/ML ophthalmic solution 1 drop 2 (two) times daily. 03/14/22   [provider]  Dulaglutide (TRULICITY) 3 LZ/7.6BH SOPN Inject  3 mg as directed once a week. 07/19/22   Sowles, Drue Stager, MD  ELIQUIS 5 MG TABS tablet TAKE 1 TABLET BY MOUTH TWICE A DAY 08/02/22   Wellington [place], MD  empagliflozin (JARDIANCE) 25 MG TABS tablet Take 1 tablet (25 mg total) by mouth daily. 07/19/22   Steele Sizer, MD  furosemide (LASIX) 20 MG tablet TAKE 1 TABLET BY MOUTH EVERY DAY 05/14/22   Ancil Boozer, Drue Stager, MD  glucose blood (ACCU-CHEK AVIVA PLUS) test strip Check fasting glucose once each morning, and check glucose as needed. 09/17/22   Steele Sizer, MD  ketoconazole (NIZORAL) 2 % cream APPLY 1 APPLICATION TOPICALLY DAILY 08/16/22   Steele Sizer, MD  Lancets (ACCU-CHEK MULTICLIX) lancets Use as instructed 09/17/22   Steele Sizer, MD  latanoprost (XALATAN) 0.005 % ophthalmic solution SMARTSIG:1 Drop(s) In Eye(s) Every Evening 03/13/22   [provider]  levothyroxine (SYNTHROID) 50 MCG tablet Take 1 tablet (50 mcg total) by mouth daily before breakfast. And take two tablets twice a week 07/19/22   Steele Sizer, MD  loratadine (CLARITIN) 10 MG tablet Take 1 tablet (10 mg total) by mouth daily. 01/10/21   Steele Sizer, MD  metFORMIN (GLUCOPHAGE) 850 MG tablet TAKE 1 TABLET BY MOUTH 2 TIMES DAILY. 06/10/22   Sowles, Drue Stager, MD  MITIGARE 0.6 MG CAPS TAKE 1-2 CAPSULES (0.6-1.2 MG TOTAL) BY MOUTH DAILY AS NEEDED. FOR GOUT, NOT DAILY 11/27/20   Steele Sizer, MD  olmesartan (BENICAR) 40 MG tablet Take 1 tablet (40 mg total) by mouth daily. 07/19/22   Steele Sizer, MD  potassium chloride SA (KLOR-CON M) 10 MEQ tablet Take 1 tablet (10 mEq total) by mouth daily. 06/26/22   Furth, Cadence H, PA-C  pravastatin (PRAVACHOL) 40 MG tablet Take 1 tablet (40 mg total) by mouth daily. 07/19/22   Sowles, Drue Stager, MD  tacrolimus (PROTOPIC) 0.1 % ointment APPLY TWICE A DAY TO RASH UNDER BREAST AND IN GROIN FOR 2 WEEKS, THEN DECREASE TO EVERY DAY UNTIL CLEAR, THEN PRN FLARES 06/04/21   Brendolyn Patty, MD  Travoprost, BAK Free, (TRAVATAN) 0.004 % SOLN  ophthalmic solution Place 1 drop into both eyes at bedtime.    [provider]  Vitamin D, Ergocalciferol, (DRISDOL) 1.25 MG (50000 UNIT) CAPS capsule TAKE 1 CAPSULE BY MOUTH ONE TIME PER WEEK 08/16/22   Steele Sizer, MD    Physical Exam   Triage Vital Signs: ED Triage Vitals  Enc Vitals Group     BP 10/18/22 2000 (!) 171/98     Pulse Rate 10/18/22 2000 64     Resp 10/18/22 2000 17     Temp 10/18/22 2000 98.1 F (36.7  C)     Temp Source 10/18/22 2000 Oral     SpO2 10/18/22 2000 98 %     Weight 10/18/22 2001 185 lb (83.9 kg)     Height 10/18/22 2001 _0  (1.651 m)     Head Circumference --      Peak Flow --      Pain Score 10/18/22 2001 0     Pain Loc --      Pain Edu? --      Excl. in [place]? --     Most recent vital signs: Vitals:   10/19/22 0300 10/19/22 0330  BP: (!) 175/74 (!) 170/73  Pulse: (!) 53 (!) 55  Resp: 19 19  Temp:    SpO2: 97% 98%     CONSTITUTIONAL: Alert and responds appropriately to questions. Well-appearing; well-nourished HEAD: Normocephalic, atraumatic EYES: Conjunctivae clear, pupils appear equal ENT: normal nose; moist mucous membranes, patient has 1 small 0.5 cm open area to the upper frontal gums that has brisk dark bleeding.  No clot.  No gingival swelling.  All of her teeth have been extracted. NECK: Normal range of motion CARD: Regular rate and rhythm RESP: Normal chest excursion without splinting or tachypnea; no hypoxia or respiratory distress, speaking full sentences ABD/GI: non-distended EXT: Normal ROM in all joints, no major deformities noted SKIN: Normal color for age and race, no rashes on exposed skin NEURO: Moves all extremities equally, normal speech, no facial asymmetry noted PSYCH: The patient's mood and manner are appropriate. Grooming and personal hygiene are appropriate.  ED Results / Procedures / Treatments   LABS: (all labs ordered are listed, but only abnormal results are displayed) Labs Reviewed   PROTIME-INR - Abnormal; Notable for the following components:      Result Value   Prothrombin Time 15.7 (*)    INR 1.3 (*)    All other components within normal limits  APTT - Abnormal; Notable for the following components:   aPTT 37 (*)    All other components within normal limits  CBC WITH DIFFERENTIAL/PLATELET - Abnormal; Notable for the following components:   Hemoglobin 11.5 (*)    All other components within normal limits     EKG:    Date: 10/19/2022 2:36 AM  Rate: 63  Rhythm: normal sinus rhythm  QRS Axis: normal  Intervals: normal  ST/T Wave abnormalities: normal  Conduction Disutrbances: none  Narrative Interpretation: unremarkable     RADIOLOGY: My personal review and interpretation of imaging:    I have personally reviewed all radiology reports. No results found.   PROCEDURES:  Critical Care performed: YES   CRITICAL CARE Performed by: Pryor Curia   Total critical care time: 45 minutes  Critical care time was exclusive of separately billable procedures and treating other patients.  Critical care was necessary to treat or prevent imminent or life-threatening deterioration.  Critical care was time spent personally by me on the following activities: development of treatment plan with patient and/or surrogate as well as nursing, discussions with consultants, evaluation of patient's response to treatment, examination of patient, obtaining history from patient or surrogate, ordering and performing treatments and interventions, ordering and review of laboratory studies, ordering and review of radiographic studies, pulse oximetry and re-evaluation of patient's condition.       Procedures    IMPRESSION / MDM / ASSESSMENT AND PLAN / ED COURSE  I reviewed the triage vital signs and the nursing notes.   Patient here with bleeding after tooth extraction.  On  Eliquis.     DIFFERENTIAL DIAGNOSIS (includes but not limited to):   Postprocedural  bleeding likely from coagulopathy.  Hemodynamically stable with recent normal hemoglobin but will check for anemia today.  Will also check platelets to ensure no thrombocytopenia contributing.  No sign of any postoperative infection present.  Patient's presentation is most consistent with acute complicated illness / injury requiring diagnostic workup.  PLAN: Patient here with slightly brisk bleeding coming from 1 small area to the upper right gumline.  Will have her bite down on a teabag for the next 30 minutes and then reassess.  CBC and coags pending.   MEDICATIONS GIVEN IN ED: Medications  lidocaine-EPINEPHrine (XYLOCAINE W/EPI) 2 %-1:100000 (with pres) injection 20 mL (has no administration in time range)  tranexamic acid (CYKLOKAPRON) 1000 MG/10ML topical solution 500 mg (500 mg Topical Given 10/19/22 0012)  tranexamic acid (CYKLOKAPRON) 1000 MG/10ML topical solution 500 mg (500 mg Topical Given 10/19/22 0030)  prothrombin complex conc human (KCENTRA) IVPB 4,000 Units (0 Units Intravenous Stopped 10/19/22 0246)  tranexamic acid (CYKLOKAPRON) 1000 MG/10ML topical solution 500 mg (500 mg Topical Given 10/19/22 0119)     ED COURSE: Patient's hemoglobin has dropped 1-1/2 points since it was last checked few days ago.  She remains hemodynamically stable but despite using a tea bag x 2 and several rounds of soaked TXA gauze with direct pressure for greater than 30 minutes she continues to have brisk bleeding from her gums.  We discussed the options of injecting with lidocaine with epi and trying suturing but concerns being that she will bleed from any puncture site that we place and she would be at risk for potential infection given we are closing postoperative wounds that have been there for almost 2 weeks.  We have also discussed risk and benefits of reversing her Eliquis.  Family would like to start with this first.  She is on Eliquis for stroke prevention from A-fib.  Patient currently is in a  sinus rhythm.  Will continue to have her hold direct pressure with TXA soaked gauze to her gums and reassess after Kcentra.     Patient has had multiple rounds of TXA soaked gauze and direct pressure and now about 30 minutes after Kcentra finished her bleeding has completely stopped.  We have monitored her for about another hour and she has had no recurrent bleeding.  She has been able to drink and ambulate here.  She remains hemodynamically stable.  We did discuss that holding her Eliquis and reversing it put her at higher risk for stroke but given her brisk bleeding and that she seems to be failing other methods to stop the bleeding, we all agree that it would be best to reverse her anticoagulation and hold it for the next week until her gums have had a chance to fully heal.  She did drop her hemoglobin a point and a half in the past several days from bleeding just from 2 small places in her gums.  Discussed bleeding return precautions.  They have a follow-up appoint with her dentist on Monday.   At this time, I do not feel there is any life-threatening condition present. I reviewed all nursing notes, vitals, pertinent previous records.  All lab and urine results, EKGs, imaging ordered have been independently reviewed and interpreted by myself.  I reviewed all available radiology reports from any imaging ordered this visit.  Based on my assessment, I feel the patient is safe to be discharged home without further emergent  workup and can continue workup as an outpatient as needed. Discussed all findings, treatment plan as well as usual and customary return precautions.  They verbalize understanding and are comfortable with this plan.  Outpatient follow-up has been provided as needed.  All questions have been answered.  CONSULTS:  none   OUTSIDE RECORDS REVIEWED: Reviewed patient's last PCP note on 03/09/2021.     FINAL CLINICAL IMPRESSION(S) / ED DIAGNOSES   Final diagnoses:  Bleeding gums      Rx / DC Orders   ED Discharge Orders     None        Note:  This document was prepared using Dragon voice recognition software and may include unintentional dictation errors.   Gionna Polak, Delice Bison, DO 10/19/22 [phone]

## 2022-10-19 ENCOUNTER — Other Ambulatory Visit: Payer: Self-pay

## 2022-10-19 DIAGNOSIS — K068 Other specified disorders of gingiva and edentulous alveolar ridge: Secondary | ICD-10-CM | POA: Diagnosis not present

## 2022-10-19 MED ORDER — PROTHROMBIN COMPLEX CONC HUMAN 500 UNITS IV KIT
4000.0000 [IU] | PACK | Status: AC
  Administered 2022-10-19: 4000 [IU] via INTRAVENOUS
  Filled 2022-10-19: qty 4000

## 2022-10-19 MED ORDER — TRANEXAMIC ACID FOR EPISTAXIS
500.0000 mg | Freq: Once | TOPICAL | Status: AC
  Administered 2022-10-19: 500 mg via TOPICAL
  Filled 2022-10-19: qty 10

## 2022-10-19 MED ORDER — LIDOCAINE-EPINEPHRINE 2 %-1:100000 IJ SOLN
20.0000 mL | Freq: Once | INTRAMUSCULAR | Status: DC
  Filled 2022-10-19: qty 1

## 2022-10-19 MED ORDER — TRANEXAMIC ACID FOR EPISTAXIS
500.0000 mg | Freq: Once | TOPICAL | Status: AC
  Administered 2022-10-19: 500 mg via TOPICAL

## 2022-10-19 NOTE — ED Notes (Signed)
Pt drank 240 mL of H20 and bleeding did not restart.  Pt was able to ambulate around room w/o assistance and bleeding did not restart.

## 2022-10-19 NOTE — Discharge Instructions (Addendum)
I recommend that you hold your Eliquis for the next week until your gums have had a chance to heal.  Please continue with soft diet.  Please follow-up with your dentist.  We did discuss that holding your Eliquis does put you at higher risk for stroke but given you have had multiple recurrences of quite brisk bleeding and your hemoglobin has dropped 1-1/2 points I feel that benefits may outweigh risks.  If you begin bleeding again from your gums I recommend that you use a wet teabag and bite down on it for 30 minutes or a wet piece of gauze and see if this stops the bleeding.  If it does not, please contact your dentist or follow-up in the emergency department.

## 2022-10-21 ENCOUNTER — Telehealth: Payer: Self-pay | Admitting: Cardiovascular Disease

## 2022-10-21 NOTE — Telephone Encounter (Signed)
I called and spoke with the patient. I advised her of Dr. Tyrell Antonio recommendations to resume Eliquis on Wednesday morning - 12/20 due to her increased risk for stroke the longer she is off anticoagulation. She is aware to monitor for signs/ symptoms of increased bleeding and call back if needed.  The patient voices understanding and is agreeable.

## 2022-10-21 NOTE — Telephone Encounter (Signed)
Patient called to talk with Dr. Fletcher Anon or nurse in regards to her medication ELIQUIS 5 MG TABS tablet

## 2022-10-21 NOTE — Telephone Encounter (Signed)
We should not hold Eliquis for too long given that her [place] Vas score is very high.  Recommend resuming Eliquis on Wednesday morning and monitor for any signs of bleeding.

## 2022-10-21 NOTE — Telephone Encounter (Signed)
I called and spoke with the patient and her daughter.  They report:  10/07/22- 5 teeth were extracted The patient held eliquis x 3 days before her extraction She resumed eliquis on 10/08/22- 10/13/22 (On Eliquis for PAF)  10/13/22- the patient presented to the ER for bleeding in her gums. 4 stitches were applied Eliquis was held 12/11 & 12/12 Resumed Eliquis on 12/13- 12/15  10/18/22- the patient presented back to the ER for bleeding in her gums again. Tranexamic Acid was applied to her gums in the ER She was advised to stay off eliquis x 1 week  Today- the patient had follow up with her dentist. They advised to follow up in 3 weeks X-ray done in office today was unrevealing Dentist advised the patient follow up in our office regarding Eliquis They were also going to send a mouthwash in for the patient due to sores in her mouth but this has not been received at the pharmacy.  I advised the patient/ her daughter that I want to review the above with Dr. Fletcher Anon to see if he feels like an in office visit is warranted.  They are concerned that the patient has been off her blood thinner x 3 days. I advised them it takes ~ 3 days for eliquis to clear the system. She will continue to hold her dose tonight and await a call back from our office tomorrow with further MD recommendations.  They have also been advised to call the dental office back to follow up on the mouthwash that was to be sent in today.   The patient's daughter voices understanding of the above and is agreeable.

## 2022-10-23 ENCOUNTER — Telehealth: Payer: Self-pay

## 2022-10-23 NOTE — Telephone Encounter (Signed)
     Patient  visit on 12/16  at [facility]    Have you been able to follow up with your primary care physician? Yes   The patient was or was not able to obtain any needed medicine or equipment. Waiting on medication to be filled  Are there diet recommendations that you are having difficulty following? Na   Patient expresses understanding of discharge instructions and education provided has no other needs at this time.  Yes      [place], [facility], Care Management  [phone] [address], [place], [place] [address] Phone: [phone] Email: [email or web address]

## 2022-11-18 ENCOUNTER — Inpatient Hospital Stay: Payer: Medicare HMO

## 2022-11-18 ENCOUNTER — Inpatient Hospital Stay: Payer: Medicare HMO | Attending: Oncology | Admitting: Medical Oncology

## 2022-11-18 ENCOUNTER — Encounter: Payer: Self-pay | Admitting: Medical Oncology

## 2022-11-18 VITALS — BP 161/79 | HR 74 | Temp 98.4°F | Resp 16 | Ht 65.0 in | Wt 188.0 lb

## 2022-11-18 DIAGNOSIS — D508 Other iron deficiency anemias: Secondary | ICD-10-CM

## 2022-11-18 DIAGNOSIS — C7A019 Malignant carcinoid tumor of the small intestine, unspecified portion: Secondary | ICD-10-CM | POA: Diagnosis not present

## 2022-11-18 DIAGNOSIS — E538 Deficiency of other specified B group vitamins: Secondary | ICD-10-CM

## 2022-11-18 DIAGNOSIS — Z79899 Other long term (current) drug therapy: Secondary | ICD-10-CM | POA: Insufficient documentation

## 2022-11-18 DIAGNOSIS — D509 Iron deficiency anemia, unspecified: Secondary | ICD-10-CM | POA: Diagnosis not present

## 2022-11-18 DIAGNOSIS — C7B00 Secondary carcinoid tumors, unspecified site: Secondary | ICD-10-CM | POA: Diagnosis not present

## 2022-11-18 DIAGNOSIS — D519 Vitamin B12 deficiency anemia, unspecified: Secondary | ICD-10-CM | POA: Diagnosis not present

## 2022-11-18 LAB — COMPREHENSIVE METABOLIC PANEL
ALT: 9 U/L (ref 0–44)
AST: 12 U/L — ABNORMAL LOW (ref 15–41)
Albumin: 3.5 g/dL (ref 3.5–5.0)
Alkaline Phosphatase: 59 U/L (ref 38–126)
Anion gap: 8 (ref 5–15)
BUN: 11 mg/dL (ref 8–23)
CO2: 27 mmol/L (ref 22–32)
Calcium: 9.5 mg/dL (ref 8.9–10.3)
Chloride: 106 mmol/L (ref 98–111)
Creatinine, Ser: 0.92 mg/dL (ref 0.44–1.00)
GFR, Estimated: 60 mL/min — ABNORMAL LOW (ref >60)
Glucose, Bld: 89 mg/dL (ref 70–99)
Potassium: 3.9 mmol/L (ref 3.5–5.1)
Sodium: 141 mmol/L (ref 135–145)
Total Bilirubin: 0.2 mg/dL — ABNORMAL LOW (ref 0.3–1.2)
Total Protein: 6.5 g/dL (ref 6.5–8.1)

## 2022-11-18 LAB — CBC WITH DIFFERENTIAL/PLATELET
Abs Immature Granulocytes: 0.01 10*3/uL (ref 0.00–0.07)
Basophils Absolute: 0 10*3/uL (ref 0.0–0.1)
Basophils Relative: 0 %
Eosinophils Absolute: 0.1 10*3/uL (ref 0.0–0.5)
Eosinophils Relative: 1 %
HCT: 39.4 % (ref 36.0–46.0)
Hemoglobin: 12.1 g/dL (ref 12.0–15.0)
Immature Granulocytes: 0 %
Lymphocytes Relative: 32 %
Lymphs Abs: 2.5 10*3/uL (ref 0.7–4.0)
MCH: 27.7 pg (ref 26.0–34.0)
MCHC: 30.7 g/dL (ref 30.0–36.0)
MCV: 90.2 fL (ref 80.0–100.0)
Monocytes Absolute: 0.5 10*3/uL (ref 0.1–1.0)
Monocytes Relative: 7 %
Neutro Abs: 4.7 10*3/uL (ref 1.7–7.7)
Neutrophils Relative %: 60 %
Platelets: 278 10*3/uL (ref 150–400)
RBC: 4.37 MIL/uL (ref 3.87–5.11)
RDW: 15.1 % (ref 11.5–15.5)
WBC: 7.8 10*3/uL (ref 4.0–10.5)
nRBC: 0 % (ref 0.0–0.2)

## 2022-11-18 LAB — VITAMIN B12: Vitamin B-12: 193 pg/mL (ref 180–914)

## 2022-11-18 LAB — FOLATE: Folate: 6.8 ng/mL (ref >5.9)

## 2022-11-18 MED ORDER — CYANOCOBALAMIN 1000 MCG/ML IJ SOLN
1000.0000 ug | INTRAMUSCULAR | Status: DC
  Administered 2022-11-18: 1000 ug via INTRAMUSCULAR
  Filled 2022-11-18: qty 1

## 2022-11-18 NOTE — Progress Notes (Unsigned)
Hematology/Oncology Consult note [facility]  [phone] [phone]  Patient Care Team: Steele Sizer, MD as PCP - General (Family Medicine) Wellington Hampshire, MD as PCP - Cardiology (Cardiology) Sindy Guadeloupe, MD as Consulting Physician (Oncology) Cammie Sickle, MD as Consulting Physician (Internal Medicine) Brendolyn Patty, MD (Dermatology) Sindy Guadeloupe, MD as Consulting Physician (Hematology and Oncology)   Name of the patient: Susan Bowen  [id number]  April 24, 1932   Date of visit: 11/19/22  Diagnosis- 1. intra abdominal adenopathy- metastatic carcinoid on surveillance 2.  Iron and B12 deficiency anemia  Chief complaint/ Reason for visit-routine follow-up of carcinoid  Heme/Onc history: Patient is a 89 year old female who underwent CT renal stone study for flank pain.  CT scan showed pathologically enlarged lymph nodes in the right lower quadrant measuring up to 2.6 cm.  Findings concerning for lymphoma or malignant process.  Patient's past medical history significant for pulmonary hypertension, CKD, hypothyroidism among other medical problems.  She did not have any B symptoms.  This was followed by a PET CT scan Which showed 3 prominent soft tissue nodule/lymph nodes one in the mesentery at 2.1 cm with an SUV of 2.8.  Adjacent soft tissue nodule/lymph node in the right iliac fossa measuring 3.1 cm and SUV of 2.8.  Predominantly fat attenuation mesenteric nodule in the left lower quadrant measuring 2 cm without any significant FDG uptake   She remains on surveillance and has not started any somatostatin therapy yet  Interval history- Ms. Gang continues to do really well. She is active and relatively healthy. She denies any specific complaints or concerns at this time.  No constipation, abdominal pain, bloody stools, constipation, nausea/vomiting. Eating well. She is taking oral B12 Gummies daily as well as getting monthly B12  injections.   ECOG PS- 1 Pain scale- 0  Wt Readings from Last 3 Encounters:  11/18/22 188 lb (85.3 kg)  10/18/22 185 lb (83.9 kg)  10/13/22 191 lb (86.6 kg)    Review of systems- Review of Systems  Constitutional:  Negative for chills, fever, malaise/fatigue and weight loss.  HENT:  Negative for congestion, ear discharge and nosebleeds.   Eyes:  Negative for blurred vision.  Respiratory:  Negative for cough, hemoptysis, sputum production, shortness of breath and wheezing.   Cardiovascular:  Negative for chest pain, palpitations, orthopnea and claudication.  Gastrointestinal:  Negative for abdominal pain, blood in stool, constipation, diarrhea, heartburn, melena, nausea and vomiting.  Genitourinary:  Negative for dysuria, flank pain, frequency, hematuria and urgency.  Musculoskeletal:  Negative for back pain, joint pain and myalgias.  Skin:  Negative for rash.  Neurological:  Negative for dizziness, tingling, focal weakness, seizures, weakness and headaches.  Endo/Heme/Allergies:  Does not bruise/bleed easily.  Psychiatric/Behavioral:  Negative for depression and suicidal ideas. The patient does not have insomnia.       Allergies  Allergen Reactions   Augmentin [Amoxicillin-Pot Clavulanate] Itching    Has patient had a PCN reaction causing immediate rash, facial/tongue/throat swelling, SOB or lightheadedness with hypotension: No Has patient had a PCN reaction causing severe rash involving mucus membranes or skin necrosis: No Has patient had a PCN reaction that required hospitalization: No Has patient had a PCN reaction occurring within the last 10 years: No If all of the above answers are "NO", then may proceed with Cephalosporin use.      Past Medical History:  Diagnosis Date   Allergy    Back spasm    Bunion  Carpal tunnel syndrome    Cataracta    Diabetes mellitus without complication (HCC)    Generalized osteoarthritis    GERD (gastroesophageal reflux disease)     Gout    Hemorrhoids without complication    Hyperlipidemia    Hypertension    Hypothyroidism    Impingement syndrome of right shoulder    Insomnia    Lipoma    Lung nodule    Metastatic carcinoid tumor ([place]) 03/2016   Neuritis or radiculitis due to rupture of lumbar intervertebral disc    Obesity    Osteoporosis    PAF (paroxysmal atrial fibrillation) ([place])    a. 03/2018 AF w/ RVR-->converted spont.  CHA2DS2VASc = 7-->Eliquis.   Proteinuria    Rotator cuff tear    Systolic murmur    a. 01/159 Echo: EF 60-65%, no rwma, Gr1 DD, mild MR, mildly dil LA. Nl RV fxn. PASP 68mHg.   TIA (transient ischemic attack)    TIA (transient ischemic attack) 1998   small TIA.  no residual effects   Tinnitus of both ears    Unspecified glaucoma(365.9)    Vitamin D deficiency    Wedge compression fracture of t11-T12 vertebra, sequela      Past Surgical History:  Procedure Laterality Date   ABDOMINAL HYSTERECTOMY  1986   APPENDECTOMY     BREAST SURGERY Bilateral 1983   biopsy of each side, negative   CARPAL TUNNEL RELEASE     CATARACT EXTRACTION W/PHACO Left 09/03/2017   Procedure: CATARACT EXTRACTION PHACO AND INTRAOCULAR LENS PLACEMENT (IOC)-LEFT DIABETIC;  Surgeon: PBirder Robson MD;  Location: [facility] ORS;  Service: Ophthalmology;  Laterality: Left;  [place]:10.2 AP% 16.7 CDE 11.71 Fluid Pack Lot # [id number]  CATARACT EXTRACTION W/PHACO Right 09/30/2017   Procedure: CATARACT EXTRACTION PHACO AND INTRAOCULAR LENS PLACEMENT (IOC);  Surgeon: PBirder Robson MD;  Location: [facility] ORS;  Service: Ophthalmology;  Laterality: Right;  [place]:35.1 AP% 13.1 CDE 12.44 Fluid pack Lot # [id number]   COLON SURGERY     EYE SURGERY Left 09/03/2017   Cataract extraction   FEMUR FRACTURE SURGERY Left    FRACTURE SURGERY  2001   left femur   HEMORROIDECTOMY     polyp removed from vocal cord      Social History   Socioeconomic History   Marital status: Divorced    Spouse name: Not on file   Number  of children: 6   Years of education: Not on file   Highest education level: 10th grade  Occupational History   Not on file  Tobacco Use   Smoking status: Never   Smokeless tobacco: Never   Tobacco comments:    Smoking cessation materials not required  Vaping Use   Vaping Use: Never used  Substance and Sexual Activity   Alcohol use: No    Alcohol/week: 0.0 standard drinks of alcohol   Drug use: No   Sexual activity: Not Currently  Other Topics Concern   Not on file  Social History Narrative   Pt lives with her son; independent ADL's, no longer drives   Social Determinants of Health   Financial Resource Strain: Low Risk  (05/10/2021)   Overall Financial Resource Strain (CARDIA)    Difficulty of Paying Living Expenses: Not very hard  Food Insecurity: No Food Insecurity (05/10/2021)   Hunger Vital Sign    Worried About Running Out of Food in the Last Year: Never true    Ran Out of Food in the Last Year: Never true  Transportation Needs: No Transportation Needs (05/10/2021)   PRAPARE - Hydrologist (Medical): No    Lack of Transportation (Non-Medical): No  Physical Activity: Insufficiently Active (05/10/2021)   Exercise Vital Sign    Days of Exercise per Week: 7 days    Minutes of Exercise per Session: 10 min  Stress: No Stress Concern Present (05/10/2021)   [place]    Feeling of Stress : Not at all  Social Connections: Moderately Integrated (05/10/2021)   Social Connection and Isolation Panel [NHANES]    Frequency of Communication with Friends and Family: More than three times a week    Frequency of Social Gatherings with Friends and Family: Three times a week    Attends Religious Services: More than 4 times per year    Active Member of Clubs or Organizations: Yes    Attends Archivist Meetings: 1 to 4 times per year    Marital Status: Divorced  Intimate Partner Violence: Not  At Risk (05/10/2021)   Humiliation, Afraid, Rape, and Kick questionnaire    Fear of Current or Ex-Partner: No    Emotionally Abused: No    Physically Abused: No    Sexually Abused: No    Family History  Problem Relation Age of Onset   Diabetes Mother    Hypertension Mother    Dementia Mother    Hypertension Son      Current Outpatient Medications:    acetaminophen-codeine (TYLENOL #3) 300-30 MG tablet, 1 TAB EVERY 6 HRS AS NEEDED FOR UP TO 7 DAYS FOR PAIN. AS NEEDED FOR CHRONIC PAIN, Disp: 30 tablet, Rfl: 0   Blood Glucose Monitoring Suppl (ACCU-CHEK AVIVA PLUS) w/Device KIT, 1 kit by Does not apply route 3 (three) times daily., Disp: , Rfl:    carvedilol (COREG) 25 MG tablet, TAKE 1 TABLET (25 MG TOTAL) BY MOUTH 2 (TWO) TIMES DAILY. NEW DOSE, Disp: 180 tablet, Rfl: 1   COMBIGAN 0.2-0.5 % ophthalmic solution, Apply 1 drop to eye 2 (two) times daily., Disp: , Rfl:    diltiazem (CARDIZEM CD) 120 MG 24 hr capsule, Take 1 capsule (120 mg total) by mouth daily., Disp: 90 capsule, Rfl: 2   dorzolamide-timolol (COSOPT) 22.3-6.8 MG/ML ophthalmic solution, 1 drop 2 (two) times daily., Disp: , Rfl:    Dulaglutide (TRULICITY) 3 ZD/6.3OV SOPN, Inject 3 mg as directed once a week., Disp: 6 mL, Rfl: 1   ELIQUIS 5 MG TABS tablet, TAKE 1 TABLET BY MOUTH TWICE A DAY, Disp: 180 tablet, Rfl: 1   empagliflozin (JARDIANCE) 25 MG TABS tablet, Take 1 tablet (25 mg total) by mouth daily., Disp: 90 tablet, Rfl: 1   furosemide (LASIX) 20 MG tablet, TAKE 1 TABLET BY MOUTH EVERY DAY, Disp: 90 tablet, Rfl: 1   glucose blood (ACCU-CHEK AVIVA PLUS) test strip, Check fasting glucose once each morning, and check glucose as needed., Disp: 100 each, Rfl: 12   Lancets (ACCU-CHEK MULTICLIX) lancets, Use as instructed, Disp: 204 each, Rfl: 12   latanoprost (XALATAN) 0.005 % ophthalmic solution, SMARTSIG:1 Drop(s) In Eye(s) Every Evening, Disp: , Rfl:    levothyroxine (SYNTHROID) 50 MCG tablet, Take 1 tablet (50 mcg total) by  mouth daily before breakfast. And take two tablets twice a week, Disp: 114 tablet, Rfl: 0   loratadine (CLARITIN) 10 MG tablet, Take 1 tablet (10 mg total) by mouth daily., Disp: 90 tablet, Rfl: 1   metFORMIN (GLUCOPHAGE) 850 MG tablet, TAKE  1 TABLET BY MOUTH 2 TIMES DAILY., Disp: 180 tablet, Rfl: 1   olmesartan (BENICAR) 40 MG tablet, Take 1 tablet (40 mg total) by mouth daily., Disp: 90 tablet, Rfl: 1   potassium chloride SA (KLOR-CON M) 10 MEQ tablet, Take 1 tablet (10 mEq total) by mouth daily., Disp: 30 tablet, Rfl: 5   pravastatin (PRAVACHOL) 40 MG tablet, Take 1 tablet (40 mg total) by mouth daily., Disp: 90 tablet, Rfl: 1   Vitamin D, Ergocalciferol, (DRISDOL) 1.25 MG (50000 UNIT) CAPS capsule, TAKE 1 CAPSULE BY MOUTH ONE TIME PER WEEK, Disp: 12 capsule, Rfl: 1   allopurinol (ZYLOPRIM) 100 MG tablet, TAKE 1 TABLET BY MOUTH EVERY DAY (Patient not taking: Reported on 10/19/2022), Disp: 90 tablet, Rfl: 1   ketoconazole (NIZORAL) 2 % cream, APPLY 1 APPLICATION TOPICALLY DAILY (Patient not taking: Reported on 10/19/2022), Disp: 60 g, Rfl: 0   MITIGARE 0.6 MG CAPS, TAKE 1-2 CAPSULES (0.6-1.2 MG TOTAL) BY MOUTH DAILY AS NEEDED. FOR GOUT, NOT DAILY (Patient not taking: Reported on 10/19/2022), Disp: 30 capsule, Rfl: 0   tacrolimus (PROTOPIC) 0.1 % ointment, APPLY TWICE A DAY TO RASH UNDER BREAST AND IN GROIN FOR 2 WEEKS, THEN DECREASE TO EVERY DAY UNTIL CLEAR, THEN PRN FLARES (Patient not taking: Reported on 10/19/2022), Disp: 100 g, Rfl: 2 No current facility-administered medications for this visit.  Facility-Administered Medications Ordered in Other Visits:    cyanocobalamin (VITAMIN B12) injection 1,000 mcg, 1,000 mcg, Intramuscular, Q30 days, Sindy Guadeloupe, MD, 1,000 mcg at 10/14/22 1540  Physical exam:  Vitals:   11/18/22 1103  BP: (!) 161/79  Pulse: 74  Resp: 16  Temp: 98.4 F (36.9 C)  TempSrc: Tympanic  SpO2: 99%  Weight: 188 lb (85.3 kg)  Height: '5\' 5"'$  (1.651 m)   Physical  Exam Constitutional:      General: She is not in acute distress. Cardiovascular:     Rate and Rhythm: Normal rate and regular rhythm.     Heart sounds: Normal heart sounds.  Pulmonary:     Effort: Pulmonary effort is normal.     Breath sounds: Normal breath sounds.  Abdominal:     General: Bowel sounds are normal.     Palpations: Abdomen is soft.  Skin:    General: Skin is warm and dry.  Neurological:     Mental Status: She is alert and oriented to person, place, and time.         Latest Ref Rng & Units 11/18/2022   10:53 AM  CMP  Glucose 70 - 99 mg/dL 89   BUN 8 - 23 mg/dL 11   Creatinine 0.44 - 1.00 mg/dL 0.92   Sodium 135 - 145 mmol/L 141   Potassium 3.5 - 5.1 mmol/L 3.9   Chloride 98 - 111 mmol/L 106   CO2 22 - 32 mmol/L 27   Calcium 8.9 - 10.3 mg/dL 9.5   Total Protein 6.5 - 8.1 g/dL 6.5   Total Bilirubin 0.3 - 1.2 mg/dL 0.2   Alkaline Phos 38 - 126 U/L 59   AST 15 - 41 U/L 12   ALT 0 - 44 U/L 9       Latest Ref Rng & Units 11/18/2022   10:53 AM  CBC  WBC 4.0 - 10.5 K/uL 7.8   Hemoglobin 12.0 - 15.0 g/dL 12.1   Hematocrit 36.0 - 46.0 % 39.4   Platelets 150 - 400 K/uL 278     No images are attached to the encounter.  Assessment and plan- Patient is a 89 y.o. female .who is here for follow-up of following issues  Per Dr. Elroy Channel last note: "Carcinoid of the small bowel.  This was diagnosed back in 2020 and has remained stable so far without any treatment.  Her present scans also do not show any progression in disease.  Given the stability of her scans over the last 3 years I will plan to get yearly scans at this time."  Her last surveillance CT scan was on 05/09/2022. She will be due for her next series of scans in July 2024- sooner if indicated.   B12 deficiency: Patient is not anemic with a Hgb of 12.1 today. B12 levels continue to be low normal despite monthly IM injections and oral supplementation. AT this time I would suggest she continue both of these  efforts. I would also suggest a ferritin and iron level to be drawn at her next follow up   B12 today B12 monthly RTC 6 months with labs(CBC w/, CMP, folate, B12, iron/TIBC, ferritin) CT Chest/ ABD/Pelvis planned for July 2024 (will need to be ordered at follow up)   Visit Diagnosis 1. Metastatic carcinoid tumor ([place])   2. Other iron deficiency anemia   3. B12 deficiency      Minna Antis [facility] [phone] 11/19/2022 10:13 AM

## 2022-11-19 ENCOUNTER — Encounter: Payer: Self-pay | Admitting: Oncology

## 2022-11-29 ENCOUNTER — Other Ambulatory Visit: Payer: Self-pay | Admitting: Family Medicine

## 2022-11-29 DIAGNOSIS — R600 Localized edema: Secondary | ICD-10-CM

## 2022-11-29 DIAGNOSIS — E785 Hyperlipidemia, unspecified: Secondary | ICD-10-CM

## 2022-11-29 DIAGNOSIS — E1121 Type 2 diabetes mellitus with diabetic nephropathy: Secondary | ICD-10-CM

## 2022-12-05 ENCOUNTER — Ambulatory Visit (INDEPENDENT_AMBULATORY_CARE_PROVIDER_SITE_OTHER): Payer: Medicare HMO

## 2022-12-05 VITALS — Ht 65.5 in | Wt 188.0 lb

## 2022-12-05 DIAGNOSIS — L304 Erythema intertrigo: Secondary | ICD-10-CM

## 2022-12-05 DIAGNOSIS — Z0000 Encounter for general adult medical examination without abnormal findings: Secondary | ICD-10-CM
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
## 2022-12-05 NOTE — Patient Instructions (Signed)
Susan Bowen , Thank you for taking time to come for your Medicare Wellness Visit. I appreciate your ongoing commitment to your health goals. Please review the following plan we discussed and let me know if I can assist you in the future.   These are the goals we discussed:  Goals      Weight (lb) < 200 lb (90.7 kg)     Pt states she would like to lose weight with healthy eating and physical activity         This is a list of the screening recommended for you and due dates:  Health Maintenance  Topic Date Due   Zoster (Shingles) Vaccine (1 of 2) Never done   DTaP/Tdap/Td vaccine (2 - Td or Tdap) 04/19/2020   COVID-19 Vaccine (7 - 2023-24 season) 07/05/2022   Hemoglobin A1C  01/17/2023   Complete foot exam   07/20/2023   Eye exam for diabetics  09/13/2023   Medicare Annual Wellness Visit  12/06/2023   Pneumonia Vaccine  Completed   Flu Shot  Completed   DEXA scan (bone density measurement)  Completed   HPV Vaccine  Aged Out    Advanced directives: no  Conditions/risks identified: none  Next appointment: Follow up in one year for your annual wellness visit 12/11/2023 '@0815'$ /telephone   Preventive Care 65 Years and Older, Female Preventive care refers to lifestyle choices and visits with your health care provider that can promote health and wellness. What does preventive care include? A yearly physical exam. This is also called an annual well check. Dental exams once or twice a year. Routine eye exams. Ask your health care provider how often you should have your eyes checked. Personal lifestyle choices, including: Daily care of your teeth and gums. Regular physical activity. Eating a healthy diet. Avoiding tobacco and drug use. Limiting alcohol use. Practicing safe sex. Taking low-dose aspirin every day. Taking vitamin and mineral supplements as recommended by your health care provider. What happens during an annual well check? The services and screenings done by your  health care provider during your annual well check will depend on your age, overall health, lifestyle risk factors, and family history of disease. Counseling  Your health care provider may ask you questions about your: Alcohol use. Tobacco use. Drug use. Emotional well-being. Home and relationship well-being. Sexual activity. Eating habits. History of falls. Memory and ability to understand (cognition). Work and work Statistician. Reproductive health. Screening  You may have the following tests or measurements: Height, weight, and BMI. Blood pressure. Lipid and cholesterol levels. These may be checked every 5 years, or more frequently if you are over 26 years old. Skin check. Lung cancer screening. You may have this screening every year starting at age 61 if you have a 30-pack-year history of smoking and currently smoke or have quit within the past 15 years. Fecal occult blood test (FOBT) of the stool. You may have this test every year starting at age 70. Flexible sigmoidoscopy or colonoscopy. You may have a sigmoidoscopy every 5 years or a colonoscopy every 10 years starting at age 55. Hepatitis C blood test. Hepatitis B blood test. Sexually transmitted disease (STD) testing. Diabetes screening. This is done by checking your blood sugar (glucose) after you have not eaten for a while (fasting). You may have this done every 1-3 years. Bone density scan. This is done to screen for osteoporosis. You may have this done starting at age 35. Mammogram. This may be done every 1-2 years. Talk  to your health care provider about how often you should have regular mammograms. Talk with your health care provider about your test results, treatment options, and if necessary, the need for more tests. Vaccines  Your health care provider may recommend certain vaccines, such as: Influenza vaccine. This is recommended every year. Tetanus, diphtheria, and acellular pertussis (Tdap, Td) vaccine. You may  need a Td booster every 10 years. Zoster vaccine. You may need this after age 66. Pneumococcal 13-valent conjugate (PCV13) vaccine. One dose is recommended after age 37. Pneumococcal polysaccharide (PPSV23) vaccine. One dose is recommended after age 51. Talk to your health care provider about which screenings and vaccines you need and how often you need them. This information is not intended to replace advice given to you by your health care provider. Make sure you discuss any questions you have with your health care provider. Document Released: 11/17/2015 Document Revised: 07/10/2016 Document Reviewed: 08/22/2015 Elsevier Interactive Patient Education  2017 [place] Prevention in the Home Falls can cause injuries. They can happen to people of all ages. There are many things you can do to make your home safe and to help prevent falls. What can I do on the outside of my home? Regularly fix the edges of walkways and driveways and fix any cracks. Remove anything that might make you trip as you walk through a door, such as a raised step or threshold. Trim any bushes or trees on the path to your home. Use bright outdoor lighting. Clear any walking paths of anything that might make someone trip, such as rocks or tools. Regularly check to see if handrails are loose or broken. Make sure that both sides of any steps have handrails. Any raised decks and porches should have guardrails on the edges. Have any leaves, snow, or ice cleared regularly. Use sand or salt on walking paths during winter. Clean up any spills in your garage right away. This includes oil or grease spills. What can I do in the bathroom? Use night lights. Install grab bars by the toilet and in the tub and shower. Do not use towel bars as grab bars. Use non-skid mats or decals in the tub or shower. If you need to sit down in the shower, use a plastic, non-slip stool. Keep the floor dry. Clean up any water that spills on  the floor as soon as it happens. Remove soap buildup in the tub or shower regularly. Attach bath mats securely with double-sided non-slip rug tape. Do not have throw rugs and other things on the floor that can make you trip. What can I do in the bedroom? Use night lights. Make sure that you have a light by your bed that is easy to reach. Do not use any sheets or blankets that are too big for your bed. They should not hang down onto the floor. Have a firm chair that has side arms. You can use this for support while you get dressed. Do not have throw rugs and other things on the floor that can make you trip. What can I do in the kitchen? Clean up any spills right away. Avoid walking on wet floors. Keep items that you use a lot in easy-to-reach places. If you need to reach something above you, use a strong step stool that has a grab bar. Keep electrical cords out of the way. Do not use floor polish or wax that makes floors slippery. If you must use wax, use non-skid floor wax. Do  not have throw rugs and other things on the floor that can make you trip. What can I do with my stairs? Do not leave any items on the stairs. Make sure that there are handrails on both sides of the stairs and use them. Fix handrails that are broken or loose. Make sure that handrails are as long as the stairways. Check any carpeting to make sure that it is firmly attached to the stairs. Fix any carpet that is loose or worn. Avoid having throw rugs at the top or bottom of the stairs. If you do have throw rugs, attach them to the floor with carpet tape. Make sure that you have a light switch at the top of the stairs and the bottom of the stairs. If you do not have them, ask someone to add them for you. What else can I do to help prevent falls? Wear shoes that: Do not have high heels. Have rubber bottoms. Are comfortable and fit you well. Are closed at the toe. Do not wear sandals. If you use a stepladder: Make sure  that it is fully opened. Do not climb a closed stepladder. Make sure that both sides of the stepladder are locked into place. Ask someone to hold it for you, if possible. Clearly mark and make sure that you can see: Any grab bars or handrails. First and last steps. Where the edge of each step is. Use tools that help you move around (mobility aids) if they are needed. These include: Canes. Walkers. Scooters. Crutches. Turn on the lights when you go into a dark area. Replace any light bulbs as soon as they burn out. Set up your furniture so you have a clear path. Avoid moving your furniture around. If any of your floors are uneven, fix them. If there are any pets around you, be aware of where they are. Review your medicines with your doctor. Some medicines can make you feel dizzy. This can increase your chance of falling. Ask your doctor what other things that you can do to help prevent falls. This information is not intended to replace advice given to you by your health care provider. Make sure you discuss any questions you have with your health care provider. Document Released: 08/17/2009 Document Revised: 03/28/2016 Document Reviewed: 11/25/2014 Elsevier Interactive Patient Education  2017 [employer].

## 2022-12-05 NOTE — Addendum Note (Signed)
Addended by: Roger Shelter on: 12/05/2022 01:53 PM   Modules accepted: Orders

## 2022-12-19 ENCOUNTER — Inpatient Hospital Stay: Payer: Medicare HMO

## 2022-12-24 ENCOUNTER — Other Ambulatory Visit: Payer: Self-pay | Admitting: Family Medicine

## 2022-12-24 DIAGNOSIS — M545 Low back pain, unspecified: Secondary | ICD-10-CM

## 2022-12-26 ENCOUNTER — Other Ambulatory Visit: Payer: Self-pay

## 2022-12-26 ENCOUNTER — Inpatient Hospital Stay: Payer: Medicare HMO | Attending: Oncology

## 2022-12-26 ENCOUNTER — Other Ambulatory Visit: Payer: Self-pay | Admitting: Family Medicine

## 2022-12-26 DIAGNOSIS — D508 Other iron deficiency anemias: Secondary | ICD-10-CM

## 2022-12-26 DIAGNOSIS — E1121 Type 2 diabetes mellitus with diabetic nephropathy: Secondary | ICD-10-CM

## 2022-12-26 DIAGNOSIS — D519 Vitamin B12 deficiency anemia, unspecified: Secondary | ICD-10-CM | POA: Diagnosis not present

## 2022-12-26 DIAGNOSIS — E1169 Type 2 diabetes mellitus with other specified complication: Secondary | ICD-10-CM

## 2022-12-26 MED ORDER — CYANOCOBALAMIN 1000 MCG/ML IJ SOLN
1000.0000 ug | INTRAMUSCULAR | Status: DC
  Administered 2022-12-26: 1000 ug via INTRAMUSCULAR
  Filled 2022-12-26: qty 1

## 2022-12-27 ENCOUNTER — Encounter: Payer: Self-pay | Admitting: Cardiovascular Disease

## 2022-12-27 ENCOUNTER — Ambulatory Visit: Payer: Medicare HMO | Attending: Cardiovascular Disease | Admitting: Cardiovascular Disease

## 2022-12-27 VITALS — BP 158/72 | HR 59 | Ht 65.0 in | Wt 188.4 lb

## 2022-12-27 DIAGNOSIS — I480 Paroxysmal atrial fibrillation: Secondary | ICD-10-CM

## 2022-12-27 DIAGNOSIS — I5032 Chronic diastolic (congestive) heart failure: Secondary | ICD-10-CM

## 2022-12-27 DIAGNOSIS — I10 Essential (primary) hypertension: Secondary | ICD-10-CM | POA: Diagnosis not present

## 2022-12-27 DIAGNOSIS — E785 Hyperlipidemia, unspecified: Secondary | ICD-10-CM

## 2022-12-27 NOTE — Patient Instructions (Signed)
Medication Instructions:  No changes *If you need a refill on your cardiac medications before your next appointment, please call your pharmacy*   Lab Work: None ordered If you have labs (blood work) drawn today and your tests are completely normal, you will receive your results only by: [place] (if you have MyChart) OR A paper copy in the mail If you have any lab test that is abnormal or we need to change your treatment, we will call you to review the results.   Testing/Procedures: None ordered   Follow-Up: At [facility], you and your health needs are our priority.  As part of our continuing mission to provide you with exceptional heart care, we have created designated Provider Care Teams.  These Care Teams include your primary Cardiologist (physician) and Advanced Practice Providers (APPs -  Physician Assistants and Nurse Practitioners) who all work together to provide you with the care you need, when you need it.  We recommend signing up for the patient portal called "MyChart".  Sign up information is provided on this After Visit Summary.  MyChart is used to connect with patients for Virtual Visits (Telemedicine).  Patients are able to view lab/test results, encounter notes, upcoming appointments, etc.  Non-urgent messages can be sent to your provider as well.   To learn more about what you can do with MyChart, go to [email or web address].    Your next appointment:   6 month(s)  Provider:   You may see Kathlyn Sacramento, MD or one of the following Advanced Practice Providers on your designated Care Team:   Murray Hodgkins, NP Christell Faith, PA-C Cadence Kathlen Mody, PA-C Gerrie Nordmann, NP

## 2022-12-27 NOTE — Progress Notes (Signed)
Cardiology Office Note   Date:  12/27/2022   ID:  Susan Bowen, DOB January 22, 1932, MRN [id number]  PCP:  Steele Sizer, MD  Cardiologist:  Kathlyn Sacramento, MD   Chief Complaint  Patient presents with   Other    3 month f/u c/o occasional chest discomfort. Meds reviewed verbally with pt.       History of Present Illness: Susan Bowen is a 89 y.o. female who presents for a follow-up visit regarding paroxysmal atrial fibrillation chronic diastolic heart failure. She has multiple chronic medical conditions that include hypertension, diabetes and hyperlipidemia.   She is not a smoker and has no family history of premature coronary artery disease. She was hospitalized in May, 2019 with chest pain in the setting of A. fib with RVR.  The dose of carvedilol was increased to 12.5 mg twice daily and she was started on Eliquis for anticoagulation.  Echocardiogram showed normal LV systolic function with grade 1 diastolic dysfunction. Lexiscan Myoview in June 2019 showed no evidence of ischemia with normal ejection fraction.  In 2020, she was diagnosed with carcinoid tumor with metastasis.  This was an incidental finding on CT of the abdomen which was done for renal stone .  This has been monitored by oncology without active treatment.  Echocardiogram in August 2022 showed normal LV systolic function, grade 1 diastolic dysfunction and moderate to severe pulmonary hypertension.    She had an emergency room visit in November for A-fib with RVR.  She improved with IV diltiazem.   She was discharged home on diltiazem extended release 120 mg once daily. During her last follow-up visit, she was in sinus rhythm and I discontinued amlodipine to avoid being on 2 calcium channel blockers.  She had 2 emergency room visits in December for bleeding from her gums due to issues with her teeth but that has improved.  She denies chest pain or worsening dyspnea.  No palpitations.  Past Medical History:   Diagnosis Date   Allergy    Back spasm    Bunion    Carpal tunnel syndrome    Cataracta    Diabetes mellitus without complication (HCC)    Generalized osteoarthritis    GERD (gastroesophageal reflux disease)    Gout    Hemorrhoids without complication    Hyperlipidemia    Hypertension    Hypothyroidism    Impingement syndrome of right shoulder    Insomnia    Lipoma    Lung nodule    Metastatic carcinoid tumor ([place]) 03/2016   Neuritis or radiculitis due to rupture of lumbar intervertebral disc    Obesity    Osteoporosis    PAF (paroxysmal atrial fibrillation) ([place])    a. 03/2018 AF w/ RVR-->converted spont.  CHA2DS2VASc = 7-->Eliquis.   Proteinuria    Rotator cuff tear    Systolic murmur    a. [id number] Echo: EF 60-65%, no rwma, Gr1 DD, mild MR, mildly dil LA. Nl RV fxn. PASP 78mHg.   TIA (transient ischemic attack)    TIA (transient ischemic attack) 1998   small TIA.  no residual effects   Tinnitus of both ears    Unspecified glaucoma(365.9)    Vitamin D deficiency    Wedge compression fracture of t11-T12 vertebra, sequela     Past Surgical History:  Procedure Laterality Date   ABDOMINAL HYSTERECTOMY  1986   APPENDECTOMY     BREAST SURGERY Bilateral 1983   biopsy of each side, negative   CARPAL TUNNEL  RELEASE     CATARACT EXTRACTION W/PHACO Left 09/03/2017   Procedure: CATARACT EXTRACTION PHACO AND INTRAOCULAR LENS PLACEMENT (IOC)-LEFT DIABETIC;  Surgeon: Birder Robson, MD;  Location: [facility] ORS;  Service: Ophthalmology;  Laterality: Left;  [place] 01:10.2 AP% 16.7 CDE 11.71 Fluid Pack Lot # [id number]   CATARACT EXTRACTION W/PHACO Right 09/30/2017   Procedure: CATARACT EXTRACTION PHACO AND INTRAOCULAR LENS PLACEMENT (IOC);  Surgeon: Birder Robson, MD;  Location: [facility] ORS;  Service: Ophthalmology;  Laterality: Right;  [place] 01:35.1 AP% 13.1 CDE 12.44 Fluid pack Lot # [id number]   COLON SURGERY     EYE SURGERY Left 09/03/2017   Cataract extraction   FEMUR FRACTURE  SURGERY Left    FRACTURE SURGERY  2001   left femur   HEMORROIDECTOMY     MULTIPLE TOOTH EXTRACTIONS     polyp removed from vocal cord       Current Outpatient Medications  Medication Sig Dispense Refill   acetaminophen-codeine (TYLENOL #3) 300-30 MG tablet 1 TAB EVERY 6 HRS AS NEEDED FOR UP TO 7 DAYS FOR PAIN. AS NEEDED FOR CHRONIC PAIN 30 tablet 0   carvedilol (COREG) 25 MG tablet TAKE 1 TABLET (25 MG TOTAL) BY MOUTH 2 (TWO) TIMES DAILY. NEW DOSE 180 tablet 1   COMBIGAN 0.2-0.5 % ophthalmic solution Apply 1 drop to eye 2 (two) times daily.     diltiazem (CARDIZEM CD) 120 MG 24 hr capsule Take 1 capsule (120 mg total) by mouth daily. 90 capsule 2   dorzolamide-timolol (COSOPT) 22.3-6.8 MG/ML ophthalmic solution 1 drop 2 (two) times daily.     Dulaglutide (TRULICITY) 3 0000000 SOPN INJECT 3 MG AS DIRECTED ONCE A WEEK. 9 mL 0   ELIQUIS 5 MG TABS tablet TAKE 1 TABLET BY MOUTH TWICE A DAY 180 tablet 1   empagliflozin (JARDIANCE) 25 MG TABS tablet Take 1 tablet (25 mg total) by mouth daily. 90 tablet 1   furosemide (LASIX) 20 MG tablet TAKE 1 TABLET BY MOUTH EVERY DAY 90 tablet 0   ketoconazole (NIZORAL) 2 % cream APPLY 1 APPLICATION TOPICALLY DAILY 60 g 0   latanoprost (XALATAN) 0.005 % ophthalmic solution SMARTSIG:1 Drop(s) In Eye(s) Every Evening     levothyroxine (SYNTHROID) 50 MCG tablet Take 1 tablet (50 mcg total) by mouth daily before breakfast. And take two tablets twice a week 114 tablet 0   loratadine (CLARITIN) 10 MG tablet Take 1 tablet (10 mg total) by mouth daily. 90 tablet 1   metFORMIN (GLUCOPHAGE) 850 MG tablet TAKE 1 TABLET BY MOUTH TWICE A DAY 180 tablet 0   MITIGARE 0.6 MG CAPS TAKE 1-2 CAPSULES (0.6-1.2 MG TOTAL) BY MOUTH DAILY AS NEEDED. FOR GOUT, NOT DAILY 30 capsule 0   olmesartan (BENICAR) 40 MG tablet Take 1 tablet (40 mg total) by mouth daily. 90 tablet 1   potassium chloride SA (KLOR-CON M) 10 MEQ tablet Take 1 tablet (10 mEq total) by mouth daily. 30 tablet 5    pravastatin (PRAVACHOL) 40 MG tablet Take 1 tablet (40 mg total) by mouth daily. 90 tablet 1   tacrolimus (PROTOPIC) 0.1 % ointment APPLY TWICE A DAY TO RASH UNDER BREAST AND IN GROIN FOR 2 WEEKS, THEN DECREASE TO EVERY DAY UNTIL CLEAR, THEN PRN FLARES 100 g 2   Vitamin D, Ergocalciferol, (DRISDOL) 1.25 MG (50000 UNIT) CAPS capsule TAKE 1 CAPSULE BY MOUTH ONE TIME PER WEEK 12 capsule 1   allopurinol (ZYLOPRIM) 100 MG tablet TAKE 1 TABLET BY MOUTH EVERY DAY (Patient not taking:  Reported on 12/27/2022) 90 tablet 1   Blood Glucose Monitoring Suppl (ACCU-CHEK AVIVA PLUS) w/Device KIT 1 kit by Does not apply route 3 (three) times daily. (Patient not taking: Reported on 12/27/2022)     glucose blood (ACCU-CHEK AVIVA PLUS) test strip Check fasting glucose once each morning, and check glucose as needed. (Patient not taking: Reported on 12/05/2022) 100 each 12   Lancets (ACCU-CHEK MULTICLIX) lancets Use as instructed (Patient not taking: Reported on 12/27/2022) 204 each 12   No current facility-administered medications for this visit.   Facility-Administered Medications Ordered in Other Visits  Medication Dose Route Frequency Provider Last Rate Last Admin   cyanocobalamin (VITAMIN B12) injection 1,000 mcg  1,000 mcg Intramuscular Q30 days Sindy Guadeloupe, MD   1,000 mcg at 10/14/22 1540    Allergies:   Augmentin [amoxicillin-pot clavulanate]    Social History:  The patient  reports that she has never smoked. She has never used smokeless tobacco. She reports that she does not drink alcohol and does not use drugs.   Family History:  The patient's family history includes Dementia in her mother; Diabetes in her mother; Hypertension in her mother and son.    ROS:  Please see the history of present illness.   Otherwise, review of systems are positive for none.   All other systems are reviewed and negative.    PHYSICAL EXAM: VS:  BP (!) 158/72 (BP Location: Left Arm, Patient Position: Sitting, Cuff Size:  Normal)   Pulse (!) 59   Ht '5\' 5"'$  (1.651 m)   Wt 188 lb 6 oz (85.4 kg)   LMP  (LMP Unknown)   SpO2 95%   BMI 31.35 kg/m  , BMI Body mass index is 31.35 kg/m. GEN: Well nourished, well developed, in no acute distress  HEENT: normal  Neck: no JVD, carotid bruits, or masses Cardiac: RRR; no  rubs, or gallops.  Mild bilateral leg edema worse on the left side. 2/6 systolic murmur in the aortic area Respiratory:  clear to auscultation bilaterally, normal work of breathing GI: soft, nontender, nondistended, + BS MS: no deformity or atrophy  Skin: warm and dry, no rash Neuro:  Strength and sensation are intact Psych: euthymic mood, full affect   EKG:  EKG is ordered today. The ekg ordered today demonstrates sinus bradycardia with sinus arrhythmia.    Recent Labs: 09/08/2022: Magnesium 1.4; TSH 1.602 11/18/2022: ALT 9; BUN 11; Creatinine, Ser 0.92; Hemoglobin 12.1; Platelets 278; Potassium 3.9; Sodium 141    Lipid Panel    Component Value Date/Time   CHOL 154 01/16/2022 1150   CHOL 146 12/19/2016 0951   TRIG 85 01/16/2022 1150   HDL 66 01/16/2022 1150   HDL 50 12/19/2016 0951   CHOLHDL 2.3 01/16/2022 1150   LDLCALC 71 01/16/2022 1150      Wt Readings from Last 3 Encounters:  12/27/22 188 lb 6 oz (85.4 kg)  12/05/22 188 lb (85.3 kg)  11/18/22 188 lb (85.3 kg)          No data to display            ASSESSMENT AND PLAN:  1.  Paroxysmal atrial fibrillation: She is maintaining in sinus rhythm on carvedilol and diltiazem.  Continue anticoagulation with Eliquis 5 mg twice daily.  I reviewed her recent labs which showed normal renal function and hemoglobin.    2. Essential hypertension: Blood pressure is mildly elevated but reasonable considering her age.  I did not make any changes in  her medications.  3. Hyperlipidemia: I reviewed most recent lipid profile showed an LDL of 55.  I recommend continue with pravastatin 40 mg daily.  4.  Chronic diastolic heart failure  with moderate severe pulmonary hypertension: Continue furosemide 20 mg once daily.  She appears to be euvolemic.   Disposition:   FU with me in 6 months   Signed, Kathlyn Sacramento, MD 12/27/22

## 2023-01-02 ENCOUNTER — Other Ambulatory Visit: Payer: Self-pay | Admitting: Cardiovascular Disease

## 2023-01-02 DIAGNOSIS — I480 Paroxysmal atrial fibrillation: Secondary | ICD-10-CM

## 2023-01-02 NOTE — Telephone Encounter (Signed)
Prescription refill request for Eliquis received. Indication: a fib Last office visit: 12/27/22 Scr: 0.92 11/18/22 Age: 89 Weight: 85kg

## 2023-01-02 NOTE — Telephone Encounter (Signed)
Please review

## 2023-01-08 ENCOUNTER — Other Ambulatory Visit: Payer: Self-pay

## 2023-01-08 DIAGNOSIS — I5032 Chronic diastolic (congestive) heart failure: Secondary | ICD-10-CM

## 2023-01-08 DIAGNOSIS — E785 Hyperlipidemia, unspecified: Secondary | ICD-10-CM

## 2023-01-09 ENCOUNTER — Other Ambulatory Visit: Payer: Self-pay | Admitting: Family Medicine

## 2023-01-09 DIAGNOSIS — E890 Postprocedural hypothyroidism: Secondary | ICD-10-CM

## 2023-01-16 NOTE — Progress Notes (Signed)
Name: JANUS RAAD   MRN: [id number]    DOB: [birth year]   Date:01/17/2023       Progress Note  Subjective  Chief Complaint  Follow Up  HPI  Thyroid mass: US done in 10/2020 showed, history of thyroid ablation, taking levothyroxine  . Last TSH was normal - last level checked 01/2022  we will recheck it today   IMPRESSION: 1. Atrophic and moderately heterogeneous thyroid without worrisome new or enlarging thyroid nodules 2. Dominant right-sided thyroid nodule has decreased in size compared to the 2015 examination. Imaging stability for greater than 5 years is indicative of benign etiology. 3. None of the other discretely measured nodules/pseudo nodules meet imaging criteria to recommend percutaneous sampling or continued dedicated follow-up.  DMII with microalbuminuria and CKI stage III :  she is taking medications Trulicity, Metformin and Jardiance, ARB and statin therapy. She has not been checking her glucose lately   She denies polyphagia, polydipsia or  polyuria.  Her hgbA1C was 10.5%, dropped to 8.5% 7.8%, 7.5% , up to 7.8%  ,8 %  ,  7.8 % , 6.3 % ,6.7 % , 6.4% and today it is  6.5 % . We will recheck labs   CKI stage III: last GFR was at goal, she has good urine output and no pruritis Urine micro used to be elevated, we will recheck it today, she is on SGl-2 agonist and ARB   HTN: she has been taking medication. No chest pain, but occasionally has heart flutter.She is now on Cardizem,  Carvedilol and Benicar , furosemide  BP is elevated today , discussed returning in one week for bp check and if still elevated we will increase dose of cardizem    Afib/CHF diastolic /pulmonary HTN  : under the care of Dr. Fletcher Anon, rate controlled, she has sob with activity but very seldom, she has mild orthopnea ( uses two pillows) , she states palpitation has not been present lately, she has noticed some left side chest pain, described as dull ache that can last up to 6 minutes and improves when she  lays on right side - worse at night. She has some lower extremity edema that has been stable. She still takes lasix daily  She also has pulmonary hypertension and last Echo. She is on Eliquis , has senile purpura that is stable. EF 60-65 % on Echo done 05/2022    Hyperlipidemia: taking Pravastatin and denies side effects of medications, no myalgia. We will recheck labs   Aorta atherosclerosis on CT chest: on statin and Eliquis. She states doing better lately We will recheck labs    Carcinoid tumor with metastasis: incidental finding on CT abdomen done for evaluation of renal stone back in 03/26/2019, it showed retroperitoneal lymphadenopathy, she has since seen Dr. 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 She is not on therapy just watchful waiting.  She has regular follow ups, she still has episodes of diarrhea is down to 4 times a month that last about 6 hours per episode ,  denies  blood and mucus Appetite is good weight is down 5 lbs in the past 6 months   Anemia secondary to iron  and B12 deficiency: under the care of Dr. Janese Banks , getting B12 injections monthly , last level was worse   Intermittent low back pain: cannot take NSAID's takes tylenol with codeine prn for pain and needs a refill. 30 pills lasting a couple of months. States triggered  by activity, pain is int he lower back and no radiculitis . She had a flare about one month ago, she still has about 12 pills left of Tylenol #3   Trigger finger: going on for months on both hands , both middle fingers. Getting worse over the past couple of months, wakes up with finger flexed and tender. She has been wearing a splinter, discussed ortho referral but not interested   Patient Active Problem List   Diagnosis Date Noted   CHF (congestive heart failure), NYHA class I, chronic, diastolic ([place]) [id number]   Metastasis to intestinal lymph node ([place]) 09/18/2021   Iron deficiency anemia 12/04/2020    Carcinoid tumor, malignant ([place]) 09/08/2019   Pain in joint of left shoulder 02/25/2019   Pulmonary hypertension, unspecified ([place]) 12/28/2018   Paroxysmal A-fib ([place]) 12/28/2018   Chronic kidney disease, stage III (moderate) (HCC) 04/01/2018   Elevated parathyroid hormone 01/01/2018   Elevated uric acid in blood 04/29/2017   Plantar fasciitis of left foot 03/12/2016   Postprocedural hypothyroidism 01/23/2016   Aortic stenosis 09/26/2015   Allergic rhinitis, seasonal 08/08/2015   Benign hypertension 08/08/2015   History of pneumonia 08/08/2015   Carpal tunnel syndrome 08/08/2015   Cataract 08/08/2015   Insomnia, persistent 08/08/2015   Dyslipidemia 08/08/2015   History of depression 08/08/2015   Glaucoma 08/08/2015   Controlled gout 08/08/2015   Fever blister 08/08/2015   Bilateral hearing loss 08/08/2015   Hemorrhoid 08/08/2015   H/O iron deficiency anemia 08/08/2015   Benign neoplasm of colon 08/08/2015   Impingement syndrome of shoulder 08/08/2015   Osteoporosis, post-menopausal 08/08/2015   Generalized OA 08/08/2015   Multinodular goiter 08/08/2015   Asymptomatic varicose veins 08/08/2015   Polyp of vocal cord 08/08/2015   History of vertebral compression fracture 08/08/2015   Diabetes mellitus with proteinuric diabetic nephropathy ([place]) 08/08/2015   LVH (left ventricular hypertrophy) 08/08/2015   Moderate tricuspid regurgitation 08/08/2015   History of radioactive iodine thyroid ablation 08/08/2015   Lung nodule, solitary 05/12/2014   Chronic cough 05/11/2014   Vitamin D deficiency 12/20/2009   Plantar fascial fibromatosis 12/20/2009   Personal history of fall 07/20/2008   Cervical radiculitis 05/11/2008    Past Surgical History:  Procedure Laterality Date   ABDOMINAL HYSTERECTOMY  1986   APPENDECTOMY     BREAST SURGERY Bilateral 1983   biopsy of each side, negative   CARPAL TUNNEL RELEASE     CATARACT EXTRACTION W/PHACO Left 09/03/2017   Procedure: CATARACT  EXTRACTION PHACO AND INTRAOCULAR LENS PLACEMENT (IOC)-LEFT DIABETIC;  Surgeon: Birder Robson, MD;  Location: [facility] ORS;  Service: Ophthalmology;  Laterality: Left;  [place] 01:10.2 AP% 16.7 CDE 11.71 Fluid Pack Lot # [id number]   CATARACT EXTRACTION W/PHACO Right 09/30/2017   Procedure: CATARACT EXTRACTION PHACO AND INTRAOCULAR LENS PLACEMENT (IOC);  Surgeon: Birder Robson, MD;  Location: [facility] OR[facility];  Service: Ophthalmology;  Laterality: Right;  [place] 01:35.1 AP% 13.1 CDE 12.44 Fluid pack Lot # [id number]   COLON SURGERY     EYE SURGERY Left 09/03/2017   Cataract extraction   FEMUR FRACTURE SURGERY Left    FRACTURE SURGERY  2001   left femur   HEMORROIDECTOMY     MULTIPLE TOOTH EXTRACTIONS     polyp removed from vocal cord      Family History  Problem Relation Age of Onset   Diabetes Mother    Hypertension Mother    Dementia Mother    Hypertension Son     Social History   Tobacco Use  Smoking status: Never   Smokeless tobacco: Never   Tobacco comments:    Smoking cessation materials not required  Substance Use Topics   Alcohol use: No    Alcohol/week: 0.0 standard drinks of alcohol     Current Outpatient Medications:    acetaminophen-codeine (TYLENOL #3) 300-30 MG tablet, 1 TAB EVERY 6 HRS AS NEEDED FOR UP TO 7 DAYS FOR PAIN. AS NEEDED FOR CHRONIC PAIN, Disp: 30 tablet, Rfl: 0   carvedilol (COREG) 25 MG tablet, TAKE 1 TABLET (25 MG TOTAL) BY MOUTH 2 (TWO) TIMES DAILY. NEW DOSE, Disp: 180 tablet, Rfl: 1   COMBIGAN 0.2-0.5 % ophthalmic solution, Apply 1 drop to eye 2 (two) times daily., Disp: , Rfl:    diltiazem (CARDIZEM CD) 120 MG 24 hr capsule, Take 1 capsule (120 mg total) by mouth daily., Disp: 90 capsule, Rfl: 2   dorzolamide-timolol (COSOPT) 22.3-6.8 MG/ML ophthalmic solution, 1 drop 2 (two) times daily., Disp: , Rfl:    Dulaglutide (TRULICITY) 3 0000000 SOPN, INJECT 3 MG AS DIRECTED ONCE A WEEK., Disp: 9 mL, Rfl: 0   ELIQUIS 5 MG TABS tablet, TAKE 1 TABLET BY MOUTH  TWICE A DAY, Disp: 180 tablet, Rfl: 1   empagliflozin (JARDIANCE) 25 MG TABS tablet, Take 1 tablet (25 mg total) by mouth daily., Disp: 90 tablet, Rfl: 1   furosemide (LASIX) 20 MG tablet, TAKE 1 TABLET BY MOUTH EVERY DAY, Disp: 90 tablet, Rfl: 0   ketoconazole (NIZORAL) 2 % cream, APPLY 1 APPLICATION TOPICALLY DAILY, Disp: 60 g, Rfl: 0   latanoprost (XALATAN) 0.005 % ophthalmic solution, SMARTSIG:1 Drop(s) In Eye(s) Every Evening, Disp: , Rfl:    levothyroxine (SYNTHROID) 50 MCG tablet, TAKE 1 TABLET (50 MCG TOTAL) BY MOUTH DAILY BEFORE BREAKFAST. AND TAKE TWO TABLETS TWICE A WEEK, Disp: 114 tablet, Rfl: 0   loratadine (CLARITIN) 10 MG tablet, Take 1 tablet (10 mg total) by mouth daily., Disp: 90 tablet, Rfl: 1   metFORMIN (GLUCOPHAGE) 850 MG tablet, TAKE 1 TABLET BY MOUTH TWICE A DAY, Disp: 180 tablet, Rfl: 0   MITIGARE 0.6 MG CAPS, TAKE 1-2 CAPSULES (0.6-1.2 MG TOTAL) BY MOUTH DAILY AS NEEDED. FOR GOUT, NOT DAILY, Disp: 30 capsule, Rfl: 0   olmesartan (BENICAR) 40 MG tablet, Take 1 tablet (40 mg total) by mouth daily., Disp: 90 tablet, Rfl: 1   potassium chloride SA (KLOR-CON M) 10 MEQ tablet, Take 1 tablet (10 mEq total) by mouth daily., Disp: 30 tablet, Rfl: 5   pravastatin (PRAVACHOL) 40 MG tablet, Take 1 tablet (40 mg total) by mouth daily., Disp: 90 tablet, Rfl: 1   tacrolimus (PROTOPIC) 0.1 % ointment, APPLY TWICE A DAY TO RASH UNDER BREAST AND IN GROIN FOR 2 WEEKS, THEN DECREASE TO EVERY DAY UNTIL CLEAR, THEN PRN FLARES, Disp: 100 g, Rfl: 2   Travoprost, BAK Free, (TRAVATAN) 0.004 % SOLN ophthalmic solution, SMARTSIG:In Eye(s), Disp: , Rfl:    Vitamin D, Ergocalciferol, (DRISDOL) 1.25 MG (50000 UNIT) CAPS capsule, TAKE 1 CAPSULE BY MOUTH ONE TIME PER WEEK, Disp: 12 capsule, Rfl: 1   allopurinol (ZYLOPRIM) 100 MG tablet, TAKE 1 TABLET BY MOUTH EVERY DAY (Patient not taking: Reported on 12/27/2022), Disp: 90 tablet, Rfl: 1   Blood Glucose Monitoring Suppl (ACCU-CHEK AVIVA PLUS) w/Device KIT, 1  kit by Does not apply route 3 (three) times daily. (Patient not taking: Reported on 12/27/2022), Disp: , Rfl:    glucose blood (ACCU-CHEK AVIVA PLUS) test strip, Check fasting glucose once each morning, and check  glucose as needed. (Patient not taking: Reported on 12/05/2022), Disp: 100 each, Rfl: 12   Lancets (ACCU-CHEK MULTICLIX) lancets, Use as instructed (Patient not taking: Reported on 12/27/2022), Disp: 204 each, Rfl: 12 No current facility-administered medications for this visit.  Facility-Administered Medications Ordered in Other Visits:    cyanocobalamin (VITAMIN B12) injection 1,000 mcg, 1,000 mcg, Intramuscular, Q30 days, Sindy Guadeloupe, MD, 1,000 mcg at 10/14/22 1540  Allergies  Allergen Reactions   Augmentin [Amoxicillin-Pot Clavulanate] Itching    Has patient had a PCN reaction causing immediate rash, facial/tongue/throat swelling, SOB or lightheadedness with hypotension: No Has patient had a PCN reaction causing severe rash involving mucus membranes or skin necrosis: No Has patient had a PCN reaction that required hospitalization: No Has patient had a PCN reaction occurring within the last 10 years: No If all of the above answers are "NO", then may proceed with Cephalosporin use.     I personally reviewed active problem list, medication list, allergies, family history, social history, health maintenance with the patient/caregiver today.   ROS  Constitutional: Negative for fever or weight change.  Respiratory: Negative for cough and shortness of breath.   Cardiovascular: Negative for chest pain or palpitations.  Gastrointestinal: Negative for abdominal pain, no bowel changes.  Musculoskeletal: Negative for gait problem or joint swelling.  Skin: Negative for rash.  Neurological: Negative for dizziness or headache.  No other specific complaints in a complete review of systems (except as listed in HPI above).   Objective  Vitals:   01/17/23 1035  BP: (!) 158/80  Pulse: 82   Resp: 14  Temp: 98 F (36.7 C)  TempSrc: Oral  SpO2: 94%  Weight: 188 lb 11.2 oz (85.6 kg)  Height: 5' 3.5" (1.613 m)    Body mass index is 32.9 kg/m.  Physical Exam  Constitutional: Patient appears well-developed and well-nourished. Obese  No distress.  HEENT: head atraumatic, normocephalic, pupils equal and reactive to light,, neck supple Cardiovascular: Normal rate, regular rhythm and normal heart sounds.  No murmur heard. Trace  BLE edema. Pulmonary/Chest: Effort normal and breath sounds normal. No respiratory distress. Abdominal: Soft.  There is no tenderness. Psychiatric: Patient has a normal mood and affect. behavior is normal. Judgment and thought content normal.    PHQ2/9:    01/17/2023   10:51 AM 12/05/2022    8:33 AM 07/19/2022   10:51 AM 01/16/2022   10:43 AM 09/18/2021   10:33 AM  Depression screen PHQ 2/9  Decreased Interest 0 0 0 0 0  Down, Depressed, Hopeless 0 0 0 0 0  PHQ - 2 Score 0 0 0 0 0  Altered sleeping 0  2 0 0  Tired, decreased energy 0  0 0 0  Change in appetite 0  0 0 0  Feeling bad or failure about yourself  0  0 0 0  Trouble concentrating 0  0 0 0  Moving slowly or fidgety/restless 0  0 0 0  Suicidal thoughts 0  0 0 0  PHQ-9 Score 0  2 0 0    phq 9 is negative   Fall Risk:    01/17/2023   10:33 AM 12/05/2022    8:25 AM 07/19/2022   10:51 AM 01/16/2022   10:43 AM 09/18/2021   10:33 AM  Fall Risk   Falls in the past year? 0 0 0 1 0  Number falls in past yr:  0 0 0 0  Injury with Fall?  0 0 0 0  Risk for fall due to : No Fall Risks No Fall Risks No Fall Risks No Fall Risks No Fall Risks  Follow up Falls prevention discussed;Education provided;Falls evaluation completed Education provided;Falls prevention discussed Falls prevention discussed Falls prevention discussed Falls prevention discussed     Assessment & Plan  1. Dyslipidemia associated with type 2 diabetes mellitus (HCC)  - POCT HgB A1C - Microalbumin / creatinine urine  ratio - Lipid panel - COMPLETE METABOLIC PANEL WITH GFR - Dulaglutide (TRULICITY) 3 0000000 SOPN; Inject 3 mg as directed once a week.  Dispense: 9 mL; Refill: 0 - empagliflozin (JARDIANCE) 25 MG TABS tablet; Take 1 tablet (25 mg total) by mouth daily.  Dispense: 90 tablet; Refill: 1 - metFORMIN (GLUCOPHAGE) 850 MG tablet; Take 1 tablet (850 mg total) by mouth 2 (two) times daily.  Dispense: 180 tablet; Refill: 1 - Blood Glucose Monitoring Suppl DEVI; 1 each by Does not apply route in the morning, at noon, and at bedtime. May substitute to any manufacturer covered by patient's insurance.  Dispense: 1 each; Refill: 0 - Glucose Blood (BLOOD GLUCOSE TEST STRIPS) STRP; 1 each by In Vitro route in the morning, at noon, and at bedtime. May substitute to any manufacturer covered by patient's insurance.  Dispense: 100 strip; Refill: 0  2. Paroxysmal A-fib (HCC)  Rate controlled   3. CHF (congestive heart failure), NYHA class I, chronic, diastolic (HCC)  - olmesartan (BENICAR) 40 MG tablet; Take 1 tablet (40 mg total) by mouth daily.  Dispense: 90 tablet; Refill: 1 - potassium chloride (KLOR-CON M) 10 MEQ tablet; Take 1 tablet (10 mEq total) by mouth daily.  Dispense: 90 tablet; Refill: 1 - furosemide (LASIX) 20 MG tablet; Take 1 tablet (20 mg total) by mouth daily.  Dispense: 90 tablet; Refill: 0  4. Stage 3a chronic kidney disease ([place])  Reviewed labs   5. Diabetes mellitus with proteinuric diabetic nephropathy (HCC)  - Dulaglutide (TRULICITY) 3 0000000 SOPN; Inject 3 mg as directed once a week.  Dispense: 9 mL; Refill: 0 - empagliflozin (JARDIANCE) 25 MG TABS tablet; Take 1 tablet (25 mg total) by mouth daily.  Dispense: 90 tablet; Refill: 1 - metFORMIN (GLUCOPHAGE) 850 MG tablet; Take 1 tablet (850 mg total) by mouth 2 (two) times daily.  Dispense: 180 tablet; Refill: 1  6. Metastasis to intestinal lymph node (HCC)  Under the care of oncologist   7. Vitamin D deficiency  - Vitamin D,  Ergocalciferol, (DRISDOL) 1.25 MG (50000 UNIT) CAPS capsule; Take 1 capsule (50,000 Units total) by mouth every 7 (seven) days.  Dispense: 12 capsule; Refill: 1  8. Gastroesophageal reflux disease without esophagitis   9. Atherosclerosis of aorta ([place])  On statin therapy   10. Benign hypertension  - carvedilol (COREG) 25 MG tablet; Take 1 tablet (25 mg total) by mouth 2 (two) times daily. New dose  Dispense: 180 tablet; Refill: 1 - olmesartan (BENICAR) 40 MG tablet; Take 1 tablet (40 mg total) by mouth daily.  Dispense: 90 tablet; Refill: 1  11. B12 deficiency   12. Hypothyroidism, postablative  - TSH  13. Dyslipidemia  - pravastatin (PRAVACHOL) 40 MG tablet; Take 1 tablet (40 mg total) by mouth daily.  Dispense: 90 tablet; Refill: 1

## 2023-01-17 ENCOUNTER — Ambulatory Visit (INDEPENDENT_AMBULATORY_CARE_PROVIDER_SITE_OTHER): Payer: Medicare HMO | Admitting: Family Medicine

## 2023-01-17 ENCOUNTER — Inpatient Hospital Stay: Payer: Medicare HMO

## 2023-01-17 ENCOUNTER — Encounter: Payer: Self-pay | Admitting: Family Medicine

## 2023-01-17 VITALS — BP 130/70 | HR 82 | Temp 98.0°F | Resp 14 | Ht 63.5 in | Wt 188.7 lb

## 2023-01-17 DIAGNOSIS — N1831 Chronic kidney disease, stage 3a: Secondary | ICD-10-CM

## 2023-01-17 DIAGNOSIS — E890 Postprocedural hypothyroidism: Secondary | ICD-10-CM | POA: Diagnosis not present

## 2023-01-17 DIAGNOSIS — C772 Secondary and unspecified malignant neoplasm of intra-abdominal lymph nodes: Secondary | ICD-10-CM | POA: Diagnosis not present

## 2023-01-17 DIAGNOSIS — I700 Atherosclerosis of aorta: Secondary | ICD-10-CM | POA: Diagnosis not present

## 2023-01-17 DIAGNOSIS — I480 Paroxysmal atrial fibrillation: Secondary | ICD-10-CM | POA: Diagnosis not present

## 2023-01-17 DIAGNOSIS — K219 Gastro-esophageal reflux disease without esophagitis: Secondary | ICD-10-CM

## 2023-01-17 DIAGNOSIS — E1121 Type 2 diabetes mellitus with diabetic nephropathy: Secondary | ICD-10-CM | POA: Diagnosis not present

## 2023-01-17 DIAGNOSIS — E785 Hyperlipidemia, unspecified: Secondary | ICD-10-CM | POA: Diagnosis not present

## 2023-01-17 DIAGNOSIS — E1169 Type 2 diabetes mellitus with other specified complication: Secondary | ICD-10-CM | POA: Diagnosis not present

## 2023-01-17 DIAGNOSIS — E559 Vitamin D deficiency, unspecified: Secondary | ICD-10-CM | POA: Diagnosis not present

## 2023-01-17 DIAGNOSIS — E538 Deficiency of other specified B group vitamins: Secondary | ICD-10-CM

## 2023-01-17 DIAGNOSIS — I5032 Chronic diastolic (congestive) heart failure: Secondary | ICD-10-CM

## 2023-01-17 DIAGNOSIS — I10 Essential (primary) hypertension: Secondary | ICD-10-CM

## 2023-01-17 LAB — POCT GLYCOSYLATED HEMOGLOBIN (HGB A1C): Hemoglobin A1C: 6.5 % — AB (ref 4.0–5.6)

## 2023-01-17 MED ORDER — CARVEDILOL 25 MG PO TABS: 25.0000 mg | ORAL_TABLET | Freq: Two times a day (BID) | ORAL | 1 refills | Status: DC

## 2023-01-17 MED ORDER — EMPAGLIFLOZIN 25 MG PO TABS: 25.0000 mg | ORAL_TABLET | Freq: Every day | ORAL | 1 refills | Status: DC

## 2023-01-17 MED ORDER — PRAVASTATIN SODIUM 40 MG PO TABS: 40.0000 mg | ORAL_TABLET | Freq: Every day | ORAL | 1 refills | Status: DC

## 2023-01-17 MED ORDER — LANCETS MISC. MISC: 1.0000 | Freq: Three times a day (TID) | 0 refills | Status: DC

## 2023-01-17 MED ORDER — LANCET DEVICE MISC: 1.0000 | Freq: Three times a day (TID) | 0 refills | Status: DC

## 2023-01-17 MED ORDER — TRULICITY 3 MG/0.5ML ~~LOC~~ SOAJ: 3.0000 mg | SUBCUTANEOUS | 0 refills | Status: DC

## 2023-01-17 MED ORDER — BLOOD GLUCOSE TEST VI STRP: 1.0000 | ORAL_STRIP | Freq: Three times a day (TID) | 0 refills | Status: DC

## 2023-01-17 MED ORDER — OLMESARTAN MEDOXOMIL 40 MG PO TABS: 40.0000 mg | ORAL_TABLET | Freq: Every day | ORAL | 1 refills | Status: DC

## 2023-01-17 MED ORDER — METFORMIN HCL 850 MG PO TABS: 850.0000 mg | ORAL_TABLET | Freq: Two times a day (BID) | ORAL | 1 refills | Status: DC

## 2023-01-17 MED ORDER — BLOOD GLUCOSE MONITORING SUPPL DEVI: 1.0000 | Freq: Three times a day (TID) | 0 refills | Status: AC

## 2023-01-17 MED ORDER — POTASSIUM CHLORIDE CRYS ER 10 MEQ PO TBCR: 10.0000 meq | EXTENDED_RELEASE_TABLET | Freq: Every day | ORAL | 1 refills | Status: DC

## 2023-01-17 MED ORDER — VITAMIN D (ERGOCALCIFEROL) 1.25 MG (50000 UNIT) PO CAPS: 50000.0000 [IU] | ORAL_CAPSULE | ORAL | 1 refills | Status: DC

## 2023-01-17 MED ORDER — FUROSEMIDE 20 MG PO TABS: 20.0000 mg | ORAL_TABLET | Freq: Every day | ORAL | 0 refills | Status: DC

## 2023-01-17 NOTE — Patient Instructions (Addendum)
You need RSV, shingrix  and Tdap at local pharmacy

## 2023-01-18 LAB — COMPLETE METABOLIC PANEL WITH GFR
AG Ratio: 1.4 (calc) (ref 1.0–2.5)
ALT: 7 U/L (ref 6–29)
AST: 9 U/L — ABNORMAL LOW (ref 10–35)
Albumin: 3.9 g/dL (ref 3.6–5.1)
Alkaline phosphatase (APISO): 55 U/L (ref 37–153)
BUN/Creatinine Ratio: 13 (calc) (ref 6–22)
BUN: 13 mg/dL (ref 7–25)
CO2: 27 mmol/L (ref 20–32)
Calcium: 9.8 mg/dL (ref 8.6–10.4)
Chloride: 106 mmol/L (ref 98–110)
Creat: 1.03 mg/dL — ABNORMAL HIGH (ref 0.60–0.95)
Globulin: 2.8 g/dL (calc) (ref 1.9–3.7)
Glucose, Bld: 113 mg/dL — ABNORMAL HIGH (ref 65–99)
Potassium: 4.1 mmol/L (ref 3.5–5.3)
Sodium: 142 mmol/L (ref 135–146)
Total Bilirubin: 0.4 mg/dL (ref 0.2–1.2)
Total Protein: 6.7 g/dL (ref 6.1–8.1)
eGFR: 52 mL/min/{1.73_m2} — ABNORMAL LOW (ref >=60)

## 2023-01-18 LAB — MICROALBUMIN / CREATININE URINE RATIO
Creatinine, Urine: 18 mg/dL — ABNORMAL LOW (ref 20–275)
Microalb, Ur: <0.2 mg/dL

## 2023-01-18 LAB — LIPID PANEL
Cholesterol: 142 mg/dL (ref <200)
HDL: 63 mg/dL (ref >=50)
LDL Cholesterol (Calc): 59 mg/dL (calc)
Non-HDL Cholesterol (Calc): 79 mg/dL (calc) (ref <130)
Total CHOL/HDL Ratio: 2.3 (calc) (ref <5.0)
Triglycerides: 110 mg/dL (ref <150)

## 2023-01-18 LAB — TSH: TSH: 3.2 mIU/L (ref 0.40–4.50)

## 2023-01-21 ENCOUNTER — Other Ambulatory Visit: Payer: Self-pay

## 2023-01-21 DIAGNOSIS — E785 Hyperlipidemia, unspecified: Secondary | ICD-10-CM

## 2023-01-21 MED ORDER — BD SWAB SINGLE USE REGULAR PADS: 1.0000 | MEDICATED_PAD | Freq: Two times a day (BID) | 3 refills | Status: DC

## 2023-01-21 MED ORDER — TRUE METRIX LEVEL 1 LOW VI SOLN: 1.0000 | Freq: Once | 3 refills | Status: AC

## 2023-01-21 MED ORDER — BLOOD GLUCOSE TEST VI STRP: 1.0000 | ORAL_STRIP | Freq: Three times a day (TID) | 0 refills | Status: DC

## 2023-01-21 MED ORDER — ACCU-CHEK SOFTCLIX LANCETS MISC: 3 refills | Status: DC

## 2023-01-24 ENCOUNTER — Inpatient Hospital Stay: Payer: Medicare HMO | Attending: Oncology

## 2023-01-24 DIAGNOSIS — D508 Other iron deficiency anemias: Secondary | ICD-10-CM

## 2023-01-24 DIAGNOSIS — E538 Deficiency of other specified B group vitamins: Secondary | ICD-10-CM | POA: Insufficient documentation

## 2023-01-24 MED ORDER — CYANOCOBALAMIN 1000 MCG/ML IJ SOLN
1000.0000 ug | INTRAMUSCULAR | Status: DC
  Administered 2023-01-24: 1000 ug via INTRAMUSCULAR
  Filled 2023-01-24: qty 1

## 2023-01-31 ENCOUNTER — Telehealth: Payer: Self-pay

## 2023-01-31 NOTE — Progress Notes (Signed)
Care Management & Coordination Services Pharmacy Team  Reason for Encounter: Appointment Reminder  Contacted patient to confirm in office appointment with Daron Offer, PharmD on 02/05/2023 at 11:00 am.  Spoke with patient on 01/31/2023   Do you have any problems getting your medications? Yes Patient states the only problem she is having is getting her Tylenol # 3 as it is on back order.  What is your top health concern you would like to discuss at your upcoming visit?  Overall health,patient denies any concerns or side effects from her current medication. Patient reports she use a pillbox, and she will bring it with her to her appointment for the clinical pharmacist to review.  Have you seen any other providers since your last visit with PCP? No   Chart review:  Recent office visits:  01/17/2023 Dr. Ancil Boozer MD (PCP) No Medication changes noted 12/05/2022 Laqueta Jean LPN (PCP Office) Medicare Wellness completed, no medication changes noted  Recent consult visits:  12/27/2022 Dr. Fletcher Anon MD (Cardiology)No Medication changes noted, Follow up in 6 months 11/18/2022 [name] PA-C (Oncology)No Medication Changes noted 09/12/2022 [name] (Ophthalmology) Unable to see note 09/10/2022 Dr. Fletcher Anon MD (Cardiology) Discontinue Amlodipine, follow up in 3 months  Hospital visits:  Medication Reconciliation was completed by comparing discharge summary, patient's EMR and Pharmacy list, and upon discussion with patient.  Admitted to the hospital on 10/18/2022 due to Bleeding gum. Discharge date was 10/19/2022. Discharged from [place]?Medications Started at [facility] Discharge:?? -started None ID  Medication Changes at Hospital Discharge: -Changed None ID  Medications Discontinued at Hospital Discharge: -Stopped None ID  Medications that remain the same after Hospital Discharge:??  -All other medications will remain the same.    Admitted to the  hospital on 10/13/2022 due to Bleeding . Discharge date was 10/14/2022. Discharged from [place]?Medications Started at [facility] Discharge:?? -started Cephalexin 500 mg three times daily  Medication Changes at Hospital Discharge: -Changed None ID  Medications Discontinued at Hospital Discharge: -Stopped None ID  Medications that remain the same after Hospital Discharge:??  -All other medications will remain the same.    Admitted to the hospital on 09/08/2022 due to Atrial fibrillation with rapid ventricular response. Discharge date was 09/08/2022. Discharged from [place]?Medications Started at [facility] Discharge:?? -started diltiazem 120 mg CD daily    Medication Changes at Hospital Discharge: -Changed None ID  Medications Discontinued at Hospital Discharge: -Stopped None ID  Medications that remain the same after Hospital Discharge:??  -All other medications will remain the same.     Star Rating Drugs:  Medication:Trulicity 3 mg Last Fill: 01/30/2023 90 Day Supply at [facility]. Medication:Jardiance 25 mg Last Fill: 11/22/2022 90 Day Supply at [facility]. Medication:Metformin 850 mg Last Fill: 11/29/2022 90 Day Supply at [facility]. Medication:Olmesartan 40 mg Last Fill: 12/04/2022 90 Day Supply at [facility]. Medication:Pravastatin 40 mg Last Fill: 11/28/2022 90 Day Supply at [facility].  Care Gaps: Annual wellness visit in last year? Yes Last completed 12/05/2022 Dtap Vaccine  If Diabetic: Last eye exam / retinopathy screening:Last completed 09/12/2022 Last diabetic foot exam:Last completed 07/19/2022   [facility] Pharmacist Assistant [phone]

## 2023-02-05 ENCOUNTER — Ambulatory Visit: Payer: Medicare HMO

## 2023-02-05 DIAGNOSIS — E890 Postprocedural hypothyroidism: Secondary | ICD-10-CM

## 2023-02-05 DIAGNOSIS — E1169 Type 2 diabetes mellitus with other specified complication: Secondary | ICD-10-CM

## 2023-02-05 DIAGNOSIS — E785 Hyperlipidemia, unspecified: Secondary | ICD-10-CM

## 2023-02-05 DIAGNOSIS — E559 Vitamin D deficiency, unspecified: Secondary | ICD-10-CM

## 2023-02-05 DIAGNOSIS — I480 Paroxysmal atrial fibrillation: Secondary | ICD-10-CM

## 2023-02-05 DIAGNOSIS — E1121 Type 2 diabetes mellitus with diabetic nephropathy: Secondary | ICD-10-CM

## 2023-02-05 DIAGNOSIS — I5032 Chronic diastolic (congestive) heart failure: Secondary | ICD-10-CM

## 2023-02-05 DIAGNOSIS — I10 Essential (primary) hypertension: Secondary | ICD-10-CM

## 2023-02-05 NOTE — Progress Notes (Signed)
Care Management & Coordination Services Pharmacy Note  02/05/2023 Name:  Susan Bowen MRN:  [id number] DOB:  1932-04-09  Summary: Patient presents for initial consult.   Home blood sugars well controlled. She is not monitoring her blood pressure, but has ordered a cuff from [facility].   Patient was interested in compliance packaging through [facility]  Recommendations/Changes made from today's visit: Continue current medications  Follow up plan: CPP follow-up 3 months.   Subjective: Susan Bowen is an 89 y.o. year old female who is a primary patient of Steele Sizer, MD.  The care coordination team was consulted for assistance with disease management and care coordination needs.    Engaged with patient face to face for initial visit.  Recent office visits: 01/17/2023 Dr. Ancil Boozer MD (PCP) No Medication changes noted 12/05/2022 Laqueta Jean LPN (PCP Office) Medicare Wellness completed, no medication changes noted  Recent consult visits: 12/27/2022 Dr. Fletcher Anon MD (Cardiology)No Medication changes noted, Follow up in 6 months 11/18/2022 [name] PA-C (Oncology)No Medication Changes noted 09/12/2022 [name] (Ophthalmology) Unable to see note 09/10/2022 Dr. Fletcher Anon MD (Cardiology) Discontinue Amlodipine, follow up in 3 months  Hospital visits: Admitted to the hospital on 10/18/2022 due to Bleeding gum. Discharge date was 10/19/2022. Discharged from [facility].   Admitted to the hospital on 10/13/2022 due to Bleeding . Discharge date was 10/14/2022. Discharged from [facility].    Admitted to the hospital on 09/08/2022 due to Atrial fibrillation with rapid ventricular response. Discharge date was 09/08/2022. Discharged from [facility].   Objective:  Lab Results  Component Value Date   CREATININE 1.03 (H) 01/17/2023   BUN 13 01/17/2023   EGFR 52 (L) 01/17/2023   GFRNONAA 60 (L) 11/18/2022   GFRAA 46 (L) 03/22/2021   NA 142  01/17/2023   K 4.1 01/17/2023   CALCIUM 9.8 01/17/2023   CO2 27 01/17/2023   GLUCOSE 113 (H) 01/17/2023    Lab Results  Component Value Date/Time   HGBA1C 6.5 (A) 01/17/2023 11:21 AM   HGBA1C 6.4 (A) 07/19/2022 10:52 AM   HGBA1C 6.9 (H) 01/16/2022 11:50 AM   HGBA1C 8.5 (H) 12/28/2018 09:47 AM   HGBA1C 7.5 (A) 06/26/2018 01:56 PM   MICROALBUR <0.2 01/17/2023 11:38 AM   MICROALBUR 0.4 01/16/2022 11:50 AM   MICROALBUR negative 04/29/2017 12:14 PM   MICROALBUR NEGATIVE 04/22/2016 03:38 PM    Last diabetic Eye exam:  Lab Results  Component Value Date/Time   HMDIABEYEEXA No Retinopathy 09/12/2022 12:00 AM    Last diabetic Foot exam: No results found for: "HMDIABFOOTEX"   Lab Results  Component Value Date   CHOL 142 01/17/2023   HDL 63 01/17/2023   LDLCALC 59 01/17/2023   TRIG 110 01/17/2023   CHOLHDL 2.3 01/17/2023       Latest Ref Rng & Units 01/17/2023   11:38 AM 11/18/2022   10:53 AM 10/13/2022   10:21 PM  Hepatic Function  Total Protein 6.1 - 8.1 g/dL 6.7  6.5  7.0   Albumin 3.5 - 5.0 g/dL  3.5  3.6   AST 10 - 35 U/L 9  12  11    ALT 6 - 29 U/L 7  9  8    Alk Phosphatase 38 - 126 U/L  59  55   Total Bilirubin 0.2 - 1.2 mg/dL 0.4  0.2  0.5     Lab Results  Component Value Date/Time   TSH 3.20 01/17/2023 11:38 AM   TSH 1.602 09/08/2022 02:46  PM   TSH 3.18 01/16/2022 11:50 AM   FREET4 1.32 07/27/2020 02:55 PM       Latest Ref Rng & Units 11/18/2022   10:53 AM 10/18/2022   11:20 PM 10/13/2022   10:21 PM  CBC  WBC 4.0 - 10.5 K/uL 7.8  9.0  8.8   Hemoglobin 12.0 - 15.0 g/dL 12.1  11.5  12.9   Hematocrit 36.0 - 46.0 % 39.4  37.5  42.5   Platelets 150 - 400 K/uL 278  246  255     Lab Results  Component Value Date/Time   VD25OH 43 12/31/2017 12:18 PM   VD25OH 34.1 12/19/2016 09:51 AM   VITAMINB12 193 11/18/2022 10:53 AM   VITAMINB12 214 05/09/2022 09:06 AM    Clinical ASCVD: No  The ASCVD Risk score (Arnett DK, et al., 2019) failed to calculate for the  following reasons:   The 2019 ASCVD risk score is only valid for ages 24 to 77       01/17/2023   10:51 AM 12/05/2022    8:33 AM 07/19/2022   10:51 AM  Depression screen PHQ 2/9  Decreased Interest 0 0 0  Down, Depressed, Hopeless 0 0 0  PHQ - 2 Score 0 0 0  Altered sleeping 0  2  Tired, decreased energy 0  0  Change in appetite 0  0  Feeling bad or failure about yourself  0  0  Trouble concentrating 0  0  Moving slowly or fidgety/restless 0  0  Suicidal thoughts 0  0  PHQ-9 Score 0  2     Social History   Tobacco Use  Smoking Status Never  Smokeless Tobacco Never  Tobacco Comments   Smoking cessation materials not required   BP Readings from Last 3 Encounters:  01/17/23 130/70  12/27/22 (!) 158/72  11/18/22 (!) 161/79   Pulse Readings from Last 3 Encounters:  01/17/23 82  12/27/22 (!) 59  11/18/22 74   Wt Readings from Last 3 Encounters:  01/17/23 188 lb 11.2 oz (85.6 kg)  12/27/22 188 lb 6 oz (85.4 kg)  12/05/22 188 lb (85.3 kg)   BMI Readings from Last 3 Encounters:  01/17/23 32.90 kg/m  12/27/22 31.35 kg/m  12/05/22 30.81 kg/m    Allergies  Allergen Reactions   Augmentin [Amoxicillin-Pot Clavulanate] Itching    Has patient had a PCN reaction causing immediate rash, facial/tongue/throat swelling, SOB or lightheadedness with hypotension: No Has patient had a PCN reaction causing severe rash involving mucus membranes or skin necrosis: No Has patient had a PCN reaction that required hospitalization: No Has patient had a PCN reaction occurring within the last 10 years: No If all of the above answers are "NO", then may proceed with Cephalosporin use.     Medications Reviewed Today     Reviewed by Royal Hawthorn, CMA (Certified Medical Assistant) on 01/17/23 at 1049  Med List Status: <None>   Medication Order Taking? Sig Documenting Provider Last Dose Status Informant  acetaminophen-codeine (TYLENOL #3) 300-30 MG tablet [id number]  1 TAB EVERY 6 HRS AS  NEEDED FOR UP TO 7 DAYS FOR PAIN. AS NEEDED FOR CHRONIC PAIN Steele Sizer, MD  Active   allopurinol (ZYLOPRIM) 100 MG tablet [id number] No TAKE 1 TABLET BY MOUTH EVERY DAY  Patient not taking: Reported on 12/27/2022   Edrick Kins, DPM Not Taking Active   Blood Glucose Monitoring Suppl (ACCU-CHEK AVIVA PLUS) w/Device KIT [id number] No 1 kit by Does not apply route  3 (three) times daily.  Patient not taking: Reported on 12/27/2022   [provider] Not Taking Consider Medication Status and Discontinue   carvedilol (COREG) 25 MG tablet [id number]  TAKE 1 TABLET (25 MG TOTAL) BY MOUTH 2 (TWO) TIMES DAILY. NEW DOSE Steele Sizer, MD  Active   COMBIGAN 0.2-0.5 % ophthalmic solution [id number]  Apply 1 drop to eye 2 (two) times daily. [provider]  Active   cyanocobalamin (VITAMIN B12) injection 1,000 mcg [id number]   Sindy Guadeloupe, MD  Active   diltiazem (CARDIZEM CD) 120 MG 24 hr capsule [id number]  Take 1 capsule (120 mg total) by mouth daily. Wellington Hampshire, MD  Active   dorzolamide-timolol (COSOPT) 22.3-6.8 MG/ML ophthalmic solution [id number]  1 drop 2 (two) times daily. [provider]  Active   Dulaglutide (TRULICITY) 3 0000000 SOPN [id number]  INJECT 3 MG AS DIRECTED ONCE A WEEK. Steele Sizer, MD  Active   ELIQUIS 5 MG TABS tablet [id number]  TAKE 1 TABLET BY MOUTH TWICE A DAY Wellington Hampshire, MD  Active   empagliflozin (JARDIANCE) 25 MG TABS tablet [id number]  Take 1 tablet (25 mg total) by mouth daily. Steele Sizer, MD  Active   furosemide (LASIX) 20 MG tablet [id number]  TAKE 1 TABLET BY MOUTH EVERY DAY Sowles, Drue Stager, MD  Active   glucose blood (ACCU-CHEK AVIVA PLUS) test strip [id number] No Check fasting glucose once each morning, and check glucose as needed.  Patient not taking: Reported on 12/05/2022   Steele Sizer, MD Not Taking Active   ketoconazole (NIZORAL) 2 % cream [id number]  APPLY 1 APPLICATION TOPICALLY DAILY Steele Sizer, MD  Active    Lancets (ACCU-CHEK MULTICLIX) lancets [id number] No Use as instructed  Patient not taking: Reported on 12/27/2022   Steele Sizer, MD Not Taking Active   latanoprost (XALATAN) 0.005 % ophthalmic solution [id number]  SMARTSIG:1 Drop(s) In Eye(s) Every Evening [provider]  Active   levothyroxine (SYNTHROID) 50 MCG tablet [id number]  TAKE 1 TABLET (50 MCG TOTAL) BY MOUTH DAILY BEFORE BREAKFAST. AND TAKE TWO TABLETS TWICE A WEEK Sowles, Drue Stager, MD  Active   loratadine (CLARITIN) 10 MG tablet [id number]  Take 1 tablet (10 mg total) by mouth daily. Steele Sizer, MD  Active   metFORMIN (GLUCOPHAGE) 850 MG tablet [id number]  TAKE 1 TABLET BY MOUTH TWICE A Eloise Harman, MD  Active   MITIGARE 0.6 MG CAPS [id number]  TAKE 1-2 CAPSULES (0.6-1.2 MG TOTAL) BY MOUTH DAILY AS NEEDED. FOR GOUT, NOT DAILY Sowles, Drue Stager, MD  Active   olmesartan (BENICAR) 40 MG tablet [id number]  Take 1 tablet (40 mg total) by mouth daily. Steele Sizer, MD  Active   potassium chloride SA (KLOR-CON M) 10 MEQ tablet [id number]  Take 1 tablet (10 mEq total) by mouth daily. Furth, Cadence H, PA-C  Active   pravastatin (PRAVACHOL) 40 MG tablet [id number]  Take 1 tablet (40 mg total) by mouth daily. Steele Sizer, MD  Active   tacrolimus (PROTOPIC) 0.1 % ointment [id number]  APPLY TWICE A DAY TO RASH UNDER BREAST AND IN GROIN FOR 2 WEEKS, THEN DECREASE TO EVERY DAY UNTIL CLEAR, THEN PRN FLARES Brendolyn Patty, MD  Active   Travoprost, BAK Free, (TRAVATAN) 0.004 % SOLN ophthalmic solution [id number] Yes SMARTSIG:In Eye(s) [provider]  Active   Vitamin D, Ergocalciferol, (DRISDOL) 1.25 MG (50000 UNIT) CAPS capsule [id number]  TAKE 1 CAPSULE BY MOUTH ONE TIME PER WEEK Steele Sizer, MD  Active  SDOH:  (Social Determinants of Health) assessments and interventions performed: Yes SDOH Interventions    Flowsheet Row Clinical Support from 12/05/2022 in [facility] from 05/09/2020 in [facility]  SDOH Interventions    Food Insecurity Interventions Intervention Not Indicated --  Housing Interventions Intervention Not Indicated Intervention Not Indicated  Transportation Interventions Intervention Not Indicated --  Utilities Interventions Intervention Not Indicated --  Alcohol Usage Interventions Intervention Not Indicated (Score <7) --  Financial Strain Interventions Intervention Not Indicated --  Physical Activity Interventions Intervention Not Indicated --  Stress Interventions Intervention Not Indicated --  Social Connections Interventions Intervention Not Indicated --       Medication Assistance: None required.  Patient affirms current coverage meets needs.  Medication Access: Within the past 30 days, how often has patient missed a dose of medication? None Is a pillbox or other method used to improve adherence? Yes  Factors that may affect medication adherence? no barriers identified Are meds synced by current pharmacy? No  Are meds delivered by current pharmacy? No  Does patient experience delays in picking up medications due to transportation concerns? Yes   [facility] Reviewed: Is patient disadvantaged to use [facility]?: No  Current Rx insurance plan: [employer] Name and location of [facility] pharmacy:  [id number] [address], [place] [address] Phone: [phone] Fax: [phone]  [place], [place] - [address] [place] [address] Phone: [phone] Fax: [phone]  [facility] services reviewed with patient today?: Yes  Patient requests to transfer care to [facility]?: Yes   Verbal consent obtained for [facility] enhanced pharmacy services (medication synchronization, adherence packaging, delivery coordination). A medication sync plan was created to allow patient to get  all medications delivered once every 30 to 90 days per patient preference. Patient understands they have freedom to choose pharmacy and clinical pharmacist will coordinate care between all prescribers and [facility].   Compliance/Adherence/Medication fill history: Care Gaps: Annual wellness visit in last year? Yes Last completed 12/05/2022 Dtap Vaccine Last eye exam / retinopathy screening:Last completed 09/12/2022 Last diabetic foot exam:Last completed 07/19/2022  Star-Rating Drugs: Medication:Trulicity 3 mg Last Fill: [facility]/30/2023 [facility] Medication:Jardiance 25 mg Last Fill: 11/22/2022 [facility]. Medication:Metformin 850 mg Last Fill: 11/29/2022 [facility]. Medication:Olmesartan 40 mg Last Fill: 12/04/2022 [facility]. Medication:Pravastatin 40 mg Last Fill: 11/28/2022 [facility].   Assessment/Plan  Heart Failure (Goal: manage symptoms and prevent exacerbations) -Controlled -Last ejection fraction: 60-65% (Date: July 2023) -HF type: Diastolic -NYHA Class: II (slight limitation of activity) -AHA HF Stage: C (Heart disease and symptoms present) -Current treatment: Carvedilol 25 mg twice daily  Diltiazem CD 120 mg daily  Furosemide 20 mg  Jardiance 25 mg daily  Olmesartan 40 mg daily -Medications previously tried: NA  -Current home BP/HR readings: Has been trying to get blood pressure monitor.  -Current home daily weights: weighs most days, weight is stable around 188.  -Educated on Importance of weighing daily; if you gain more than 3 pounds in one day or 5 pounds in one week, contact pharmacy or cardiology team. -Recommended to continue current medication  Atrial Fibrillation (Goal: prevent stroke and major bleeding) -Controlled -CHADSVASC: 8 -Current treatment: Rate control:  Carvedilol 25 mg twice daily  Diltiazem CD 120 mg daily  Anticoagulation: Eliquis  5 mg twice  daily  -Medications previously tried: NA -Recommended to continue current medication  Hyperlipidemia: (LDL goal < 70) -Controlled -Current treatment: Pravastatin 40 mg daily  -Medications previously tried: NA  -Recommended to continue current medication  Diabetes (A1c goal <7%) -Controlled -Current medications: Jardiance 25 mg daily  Metformin 123456 mg  Trulicity 3 mg weekly  -Medications previously tried: NA  -Current home glucose readings fasting glucose: 125-136  -Denies hypoglycemic/hyperglycemic symptoms -Counseled to check feet daily and get yearly eye exams -Recommended to continue current medication  Chronic Kidney Disease Stage 3a  -All medications assessed for renal dosing and appropriateness in chronic kidney disease. -Recommended to continue current medication  Malva Limes, [place] Pharmacist Practitioner  [facility] [phone]

## 2023-02-05 NOTE — Patient Instructions (Addendum)
Visit Information It was great speaking with you today!  Please let me know if you have any questions about our visit.  Plan:  Continue all your medications We will begin using [facility] to help organize your medications.   Print copy of patient instructions, educational materials, and care plan provided in person.  Face to Face appointment with pharmacist scheduled for:  05/21/2023 at 11:00 AM  Malva Limes, [place] Pharmacist Practitioner  [facility]  [place] Pharmacist Assistant [phone]

## 2023-02-06 ENCOUNTER — Telehealth: Payer: Self-pay

## 2023-02-06 ENCOUNTER — Telehealth: Payer: Self-pay | Admitting: Cardiovascular Disease

## 2023-02-06 DIAGNOSIS — I480 Paroxysmal atrial fibrillation: Secondary | ICD-10-CM

## 2023-02-06 MED ORDER — APIXABAN 5 MG PO TABS: 5.0000 mg | ORAL_TABLET | Freq: Two times a day (BID) | ORAL | 1 refills | Status: DC

## 2023-02-06 MED ORDER — DILTIAZEM HCL ER COATED BEADS 120 MG PO CP24: 120.0000 mg | ORAL_CAPSULE | Freq: Every day | ORAL | 0 refills | Status: DC

## 2023-02-06 NOTE — Telephone Encounter (Signed)
*  STAT* If patient is at the pharmacy, call can be transferred to refill team.   1. Which medications need to be refilled? (please list name of each medication and dose if known) diltiazem (CARDIZEM CD) 120 MG 24 hr capsule  ELIQUIS 5 MG TABS tablet   2. Which pharmacy/location (including street and city if local pharmacy) is medication to be sent to?  [facility] - [place], [place] - [address] Phone: [phone]  Fax: [phone]    3. Do they need a 30 day or 90 day supply? [place]

## 2023-02-06 NOTE — Progress Notes (Signed)
I received a task from Junius Argyle, CPP requesting that I start the Onboarding form for patient  to utilize [facility] for medication synchronization, packaging and delivery.   [facility] to [facility] to [facility] on [facility]/06/2023.Per [facility], they will fax over her profile by the end of the day.  I reach out to patient cardiology to request a refill/new prescription of Diltiazem,and Eliquis to go to [facility] on 02/06/2023. Per Cariology office, she will send a message to the nurse whom should send the prescription.  I completed [facility] onboarding form.Form was sent to  Daron Offer, [facility] to review and to submit to [facility] for Processing.  [place] Pharmacist Assistant [phone]

## 2023-02-06 NOTE — Telephone Encounter (Signed)
Requested Prescriptions   Signed Prescriptions Disp Refills   diltiazem (CARDIZEM CD) 120 MG 24 hr capsule 90 capsule 0    Sig: Take 1 capsule (120 mg total) by mouth daily.    Authorizing Provider: ARIDA, MUHAMMAD A    Ordering User: Raidon Swanner C    

## 2023-02-06 NOTE — Telephone Encounter (Signed)
Eliquis 5mg  refill request received. Patient is 89 years old, weight-85.6kg, Crea-1.03 on 01/17/23, Diagnosis-Afib, and last seen by Dr. Fletcher Anon on 12/27/22. Dose is appropriate based on dosing criteria. Will send in refill to requested pharmacy.

## 2023-02-06 NOTE — Addendum Note (Signed)
Addended by: Derrel Nip B on: 02/06/2023 11:43 AM   Modules accepted: Orders

## 2023-02-06 NOTE — Telephone Encounter (Signed)
Refill Request.  

## 2023-02-10 MED ORDER — LEVOTHYROXINE SODIUM 50 MCG PO TABS
50.0000 ug | ORAL_TABLET | Freq: Every day | ORAL | 1 refills | Status: DC
Start: 2023-02-10 — End: 2023-07-17

## 2023-02-10 MED ORDER — TRULICITY 3 MG/0.5ML ~~LOC~~ SOAJ
3.0000 mg | SUBCUTANEOUS | 1 refills | Status: DC
Start: 2023-02-10 — End: 2023-05-07

## 2023-02-10 MED ORDER — EMPAGLIFLOZIN 25 MG PO TABS: 25.0000 mg | ORAL_TABLET | Freq: Every day | ORAL | 1 refills | Status: DC

## 2023-02-10 MED ORDER — VITAMIN D (ERGOCALCIFEROL) 1.25 MG (50000 UNIT) PO CAPS
50000.0000 [IU] | ORAL_CAPSULE | ORAL | 1 refills | Status: DC
Start: 2023-02-10 — End: 2023-07-23

## 2023-02-10 MED ORDER — POTASSIUM CHLORIDE CRYS ER 10 MEQ PO TBCR
10.0000 meq | EXTENDED_RELEASE_TABLET | Freq: Every day | ORAL | 1 refills | Status: DC
Start: 2023-02-10 — End: 2023-06-19

## 2023-02-10 MED ORDER — METFORMIN HCL 850 MG PO TABS: 850.0000 mg | ORAL_TABLET | Freq: Two times a day (BID) | ORAL | 1 refills | Status: DC

## 2023-02-10 MED ORDER — PRAVASTATIN SODIUM 40 MG PO TABS: 40.0000 mg | ORAL_TABLET | Freq: Every day | ORAL | 1 refills | Status: DC

## 2023-02-10 MED ORDER — OLMESARTAN MEDOXOMIL 40 MG PO TABS: 40.0000 mg | ORAL_TABLET | Freq: Every day | ORAL | 1 refills | Status: DC

## 2023-02-10 MED ORDER — CARVEDILOL 25 MG PO TABS: 25.0000 mg | ORAL_TABLET | Freq: Two times a day (BID) | ORAL | 1 refills | Status: DC

## 2023-02-10 MED ORDER — FUROSEMIDE 20 MG PO TABS: 20.0000 mg | ORAL_TABLET | Freq: Every day | ORAL | 1 refills | Status: DC

## 2023-02-10 NOTE — Addendum Note (Signed)
Addended by: Julious Payer A on: 02/10/2023 08:42 AM   Modules accepted: Orders

## 2023-02-11 ENCOUNTER — Telehealth: Payer: Self-pay

## 2023-02-11 NOTE — Progress Notes (Signed)
Per Clinical pharmacist, can you check with her when she will need the Travoprost eye drops and request a refill from her eye doctor Galen Manila, MD  I reach out to [facility] to request a refill for Travoprost eye drops to [facility] to [facility] on 02/11/2023. I Had to leave a voice message to request the refill on the nurse line.  Per [facility], It looks like they sent in Dorzol/Timol. Should we send this instead?  Patient states she does take Dorzol/Timol, but she has two bottles on hand. Patient states she also takes Travoprost as well which she has at least 2 weeks left.Patient reports she going to call them and, see what all she is suppose to be taking as she has another bottle of eye drops as well. Once she hears from them she will return my call. Notified [facility].  Everlean Cherry Clinical Pharmacist Assistant [phone]

## 2023-02-14 ENCOUNTER — Telehealth: Payer: Self-pay

## 2023-02-14 NOTE — Progress Notes (Signed)
I reach out to patient as requested per [facility] to review her medication to see which ones she will need refill and how much medication she has on hand.   Medication was review and  how much supply she has on hand.On boarding form was update and  completed/sent to [facility] for processing for delivery on 02/26/2023.  I reach out to Dr. Caprice Red MD, office to request a refill for Travoprost 0.004% eye drops again as they had sent Dorzol/Timol and Latanoprost last week. I had to leave a voice message on the nurse line of my request.  Everlean Cherry Clinical Pharmacist Assistant [phone]

## 2023-02-15 ENCOUNTER — Other Ambulatory Visit: Payer: Self-pay | Admitting: Family Medicine

## 2023-02-15 DIAGNOSIS — E1169 Type 2 diabetes mellitus with other specified complication: Secondary | ICD-10-CM

## 2023-02-17 ENCOUNTER — Inpatient Hospital Stay: Payer: Medicare HMO

## 2023-02-24 ENCOUNTER — Inpatient Hospital Stay: Payer: Medicare HMO | Attending: Oncology

## 2023-02-24 DIAGNOSIS — E538 Deficiency of other specified B group vitamins: Secondary | ICD-10-CM | POA: Diagnosis not present

## 2023-02-24 DIAGNOSIS — D508 Other iron deficiency anemias: Secondary | ICD-10-CM

## 2023-02-24 MED ORDER — CYANOCOBALAMIN 1000 MCG/ML IJ SOLN
1000.0000 ug | INTRAMUSCULAR | Status: DC
  Administered 2023-02-24: 1000 ug via INTRAMUSCULAR
  Filled 2023-02-24: qty 1

## 2023-02-25 ENCOUNTER — Telehealth: Payer: Self-pay

## 2023-02-25 NOTE — Progress Notes (Signed)
Patient states she just received her medication, and she is asking why was she charge as she was inform  there would be no charge.Patient reports it should be the same as her previous pharmacy. Patient states if she is going to be charge she will have to switch back to her old pharmacy. Notified Clinical pharmacist, and [facility].   Per [facility], Looks like the tech that processed her things did not run it on insurance , but I just resubmitted it and its showing 0$ copay.  Patient verbalized understanding , and appreciation.Patient [facility]  Everlean Cherry Clinical Pharmacist Assistant [phone]

## 2023-03-12 ENCOUNTER — Other Ambulatory Visit: Payer: Self-pay | Admitting: Family Medicine

## 2023-03-12 DIAGNOSIS — I10 Essential (primary) hypertension: Secondary | ICD-10-CM

## 2023-03-13 ENCOUNTER — Other Ambulatory Visit: Payer: Self-pay | Admitting: Family Medicine

## 2023-03-13 DIAGNOSIS — E1169 Type 2 diabetes mellitus with other specified complication: Secondary | ICD-10-CM

## 2023-03-19 ENCOUNTER — Inpatient Hospital Stay: Payer: Medicare HMO

## 2023-03-21 DIAGNOSIS — H401132 Primary open-angle glaucoma, bilateral, moderate stage: Secondary | ICD-10-CM | POA: Diagnosis not present

## 2023-03-21 DIAGNOSIS — H5347 Heteronymous bilateral field defects: Secondary | ICD-10-CM | POA: Diagnosis not present

## 2023-03-21 DIAGNOSIS — M3501 Sicca syndrome with keratoconjunctivitis: Secondary | ICD-10-CM | POA: Diagnosis not present

## 2023-03-21 DIAGNOSIS — E119 Type 2 diabetes mellitus without complications: Secondary | ICD-10-CM | POA: Diagnosis not present

## 2023-03-26 ENCOUNTER — Inpatient Hospital Stay: Payer: Medicare HMO | Attending: Oncology

## 2023-03-26 DIAGNOSIS — E538 Deficiency of other specified B group vitamins: Secondary | ICD-10-CM | POA: Diagnosis not present

## 2023-03-26 DIAGNOSIS — Z79899 Other long term (current) drug therapy: Secondary | ICD-10-CM | POA: Diagnosis not present

## 2023-03-26 DIAGNOSIS — D508 Other iron deficiency anemias: Secondary | ICD-10-CM

## 2023-03-26 MED ORDER — CYANOCOBALAMIN 1000 MCG/ML IJ SOLN
1000.0000 ug | INTRAMUSCULAR | Status: DC
  Administered 2023-03-26: 1000 ug via INTRAMUSCULAR
  Filled 2023-03-26: qty 1

## 2023-04-04 ENCOUNTER — Other Ambulatory Visit: Payer: Self-pay | Admitting: Family Medicine

## 2023-04-21 ENCOUNTER — Inpatient Hospital Stay: Payer: Medicare HMO

## 2023-04-25 ENCOUNTER — Inpatient Hospital Stay: Payer: Medicare HMO | Attending: Oncology

## 2023-04-25 ENCOUNTER — Encounter: Payer: Self-pay | Admitting: Oncology

## 2023-04-25 DIAGNOSIS — D508 Other iron deficiency anemias: Secondary | ICD-10-CM

## 2023-04-25 DIAGNOSIS — E538 Deficiency of other specified B group vitamins: Secondary | ICD-10-CM | POA: Diagnosis not present

## 2023-04-25 DIAGNOSIS — Z79899 Other long term (current) drug therapy: Secondary | ICD-10-CM | POA: Diagnosis not present

## 2023-04-25 MED ORDER — CYANOCOBALAMIN 1000 MCG/ML IJ SOLN
1000.0000 ug | INTRAMUSCULAR | Status: DC
  Administered 2023-04-25: 1000 ug via INTRAMUSCULAR
  Filled 2023-04-25: qty 1

## 2023-04-29 ENCOUNTER — Other Ambulatory Visit: Payer: Self-pay | Admitting: Family Medicine

## 2023-04-29 ENCOUNTER — Ambulatory Visit: Payer: Self-pay | Admitting: *Deleted

## 2023-04-29 ENCOUNTER — Encounter: Payer: Self-pay | Admitting: Family Medicine

## 2023-04-29 ENCOUNTER — Encounter: Payer: Self-pay | Admitting: Oncology

## 2023-04-29 ENCOUNTER — Ambulatory Visit (INDEPENDENT_AMBULATORY_CARE_PROVIDER_SITE_OTHER): Payer: Medicare HMO | Admitting: Family Medicine

## 2023-04-29 ENCOUNTER — Other Ambulatory Visit
Admission: RE | Admit: 2023-04-29 | Discharge: 2023-04-29 | Disposition: A | Discharge status: Home / Self Care | Payer: Medicare HMO | Source: Ambulatory Visit | Attending: Family Medicine | Admitting: Family Medicine

## 2023-04-29 VITALS — BP 160/78 | HR 84 | Resp 16 | Ht 64.0 in | Wt 188.0 lb

## 2023-04-29 DIAGNOSIS — R0602 Shortness of breath: Secondary | ICD-10-CM

## 2023-04-29 DIAGNOSIS — I25110 Atherosclerotic heart disease of native coronary artery with unstable angina pectoris: Secondary | ICD-10-CM

## 2023-04-29 DIAGNOSIS — M79605 Pain in left leg: Secondary | ICD-10-CM | POA: Diagnosis not present

## 2023-04-29 DIAGNOSIS — R519 Headache, unspecified: Secondary | ICD-10-CM | POA: Diagnosis not present

## 2023-04-29 DIAGNOSIS — M79602 Pain in left arm: Secondary | ICD-10-CM

## 2023-04-29 DIAGNOSIS — I10 Essential (primary) hypertension: Secondary | ICD-10-CM | POA: Diagnosis not present

## 2023-04-29 LAB — CK TOTAL AND CKMB (NOT AT ARMC)
CK, MB: 1.7 ng/mL (ref 0.5–5.0)
Total CK: 69 U/L (ref 38–234)

## 2023-04-29 LAB — TROPONIN I (HIGH SENSITIVITY): Troponin I (High Sensitivity): 4 ng/L (ref <18)

## 2023-04-29 MED ORDER — HYDRALAZINE HCL 10 MG PO TABS
10.0000 mg | ORAL_TABLET | Freq: Four times a day (QID) | ORAL | 0 refills | Status: DC | PRN
Start: 2023-04-29 — End: 2023-09-19

## 2023-04-29 NOTE — Progress Notes (Signed)
Name: Susan Bowen   MRN: [id number]    DOB: 1932-10-30   Date:04/29/2023       Progress Note  Subjective  Chief Complaint  Hypertension  HPI  Patient states she developed a dull headache a few days ago. She decided to check her bp and has been higher than usual in the 170's/180's-70's-80's. She is also feeling more fatigued than usual, taking more naps during the day and over the past 24 hours she has noticed a dull ache on left calf or left thigh , today she felt the same pain on left arm. She denies chest pain, nausea or vomiting but has noticed SOB with activity ( like walking from the car to the office) and that is not typical for her. No swelling or orthopnea. She has CAD based on CT chest . She also has a history of CHF and Afib. She is under the care of Dr. Kirke Corin  Headache is described as dull, frontal, 7/10. She is not used to have headaches. No temporal pain. No trauma or double vision, no focal deficit or numbness/tingling.   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) 09/18/2021   Iron deficiency anemia 12/04/2020   Carcinoid tumor, malignant (HCC) 09/08/2019   Pain in joint of left shoulder 02/25/2019   Pulmonary hypertension, unspecified (HCC) 12/28/2018   Paroxysmal A-fib (HCC) 12/28/2018   Chronic kidney disease, stage III (moderate) (HCC) 04/01/2018   Elevated parathyroid hormone 01/01/2018   Elevated uric acid in blood 04/29/2017   Plantar fasciitis of left foot 03/12/2016   Postprocedural hypothyroidism 01/23/2016   Aortic stenosis 09/26/2015   Allergic rhinitis, seasonal 08/08/2015   Benign hypertension 08/08/2015   History of pneumonia 08/08/2015   Carpal tunnel syndrome 08/08/2015   Cataract 08/08/2015   Insomnia, persistent 08/08/2015   Dyslipidemia 08/08/2015   History of depression 08/08/2015   Glaucoma 08/08/2015    Controlled gout 08/08/2015   Fever blister 08/08/2015   Bilateral hearing loss 08/08/2015   Hemorrhoid 08/08/2015   H/O iron deficiency anemia 08/08/2015   Benign neoplasm of colon 08/08/2015   Impingement syndrome of shoulder 08/08/2015   Osteoporosis, post-menopausal 08/08/2015   Generalized OA 08/08/2015   Multinodular goiter 08/08/2015   Asymptomatic varicose veins 08/08/2015   Polyp of vocal cord 08/08/2015   History of vertebral compression fracture 08/08/2015   Diabetes mellitus with proteinuric diabetic nephropathy (HCC) 08/08/2015   LVH (left ventricular hypertrophy) 08/08/2015   Moderate tricuspid regurgitation 08/08/2015   History of radioactive iodine thyroid ablation 08/08/2015   Lung nodule, solitary 05/12/2014   Chronic cough 05/11/2014   Vitamin D deficiency 12/20/2009   Plantar fascial fibromatosis 12/20/2009   Personal history of fall 07/20/2008   Cervical radiculitis 05/11/2008    Past Surgical History:  Procedure Laterality Date   ABDOMINAL HYSTERECTOMY  1986   APPENDECTOMY     BREAST SURGERY Bilateral 1983   biopsy of each side, negative   CARPAL TUNNEL RELEASE     CATARACT EXTRACTION W/PHACO Left 09/03/2017   Procedure: CATARACT EXTRACTION PHACO AND INTRAOCULAR LENS PLACEMENT (IOC)-LEFT DIABETIC;  Surgeon: Galen Manila, MD;  Location: [facility] OR[facility];  Service: Ophthalmology;  Laterality: Left;  [place] 01:10.2 AP% 16.7 CDE 11.71 Fluid Pack Lot # [id number]   CATARACT EXTRACTION W/PHACO Right 09/30/2017   Procedure: CATARACT EXTRACTION PHACO AND INTRAOCULAR LENS PLACEMENT (IOC);  Surgeon: Galen Manila, MD;  Location: [facility] OR[facility];  Service: Ophthalmology;  Laterality: Right;  [place] 01:35.1 AP% 13.1 CDE 12.44 Fluid pack Lot # [id number]   COLON SURGERY     EYE SURGERY Left 09/03/2017   Cataract extraction   FEMUR FRACTURE SURGERY Left    FRACTURE SURGERY  2001   left femur   HEMORROIDECTOMY     MULTIPLE TOOTH EXTRACTIONS     polyp removed from vocal cord       Family History  Problem Relation Age of Onset   Diabetes Mother    Hypertension Mother    Dementia Mother    Hypertension Son     Social History   Tobacco Use   Smoking status: Never   Smokeless tobacco: Never   Tobacco comments:    Smoking cessation materials not required  Substance Use Topics   Alcohol use: No    Alcohol/week: 0.0 standard drinks of alcohol     Current Outpatient Medications:    acetaminophen (TYLENOL) 500 MG tablet, Take 500 mg by mouth every 6 (six) hours as needed., Disp: , Rfl:    acetaminophen-codeine (TYLENOL #3) 300-30 MG tablet, 1 TAB EVERY 6 HRS AS NEEDED FOR UP TO 7 DAYS FOR PAIN. AS NEEDED FOR CHRONIC PAIN, Disp: 30 tablet, Rfl: 0   Alcohol Swabs (B-D SINGLE USE SWABS REGULAR) PADS, 1 each by Does not apply route 2 (two) times daily., Disp: 100 each, Rfl: 3   apixaban (ELIQUIS) 5 MG TABS tablet, Take 1 tablet (5 mg total) by mouth 2 (two) times daily., Disp: 180 tablet, Rfl: 1   Blood Glucose Calibration (TRUE METRIX LEVEL 1) Low SOLN, 1 each by In Vitro route once for 1 dose., Disp: 1 each, Rfl: 3   Blood Glucose Monitoring Suppl (ACCU-CHEK GUIDE) w/Device KIT, PLEASE SEE ATTACHED FOR DETAILED DIRECTIONS, Disp: , Rfl:    Blood Glucose Monitoring Suppl DEVI, 1 each by Does not apply route in the morning, at noon, and at bedtime. May substitute to any manufacturer covered by patient's insurance., Disp: 1 each, Rfl: 0   carvedilol (COREG) 25 MG tablet, Take 1 tablet (25 mg total) by mouth 2 (two) times daily. New dose, Disp: 180 tablet, Rfl: 1   diltiazem (CARDIZEM CD) 120 MG 24 hr capsule, Take 1 capsule (120 mg total) by mouth daily., Disp: 90 capsule, Rfl: 0   dorzolamide-timolol (COSOPT) 22.3-6.8 MG/ML ophthalmic solution, Place 1 drop into both eyes 2 (two) times daily., Disp: , Rfl:    Dulaglutide (TRULICITY) 3 MG/0.5ML SOPN, Inject 3 mg as directed once a week., Disp: 9 mL, Rfl: 1   empagliflozin (JARDIANCE) 25 MG TABS tablet, Take 1 tablet  (25 mg total) by mouth daily., Disp: 90 tablet, Rfl: 1   furosemide (LASIX) 20 MG tablet, Take 1 tablet (20 mg total) by mouth daily., Disp: 90 tablet, Rfl: 1   ketoconazole (NIZORAL) 2 % cream, APPLY 1 APPLICATION TOPICALLY DAILY, Disp: 60 g, Rfl: 0   levothyroxine (SYNTHROID) 50 MCG tablet, Take 1 tablet (50 mcg total) by mouth daily before breakfast. And take two tablets twice a week, Disp: 114 tablet, Rfl: 1   loratadine (CLARITIN) 10 MG tablet, Take 1 tablet (10 mg total) by mouth daily., Disp: 90 tablet, Rfl: 1   metFORMIN (GLUCOPHAGE) 850 MG tablet, Take 1 tablet (850 mg total) by mouth 2 (two) times daily., Disp: 180 tablet, Rfl: 1   Misc Natural Products (NEURIVA PO), Take 1 tablet by mouth daily., Disp: , Rfl:    olmesartan (BENICAR)  40 MG tablet, Take 1 tablet (40 mg total) by mouth daily., Disp: 90 tablet, Rfl: 1   potassium chloride (KLOR-CON M) 10 MEQ tablet, Take 1 tablet (10 mEq total) by mouth daily., Disp: 90 tablet, Rfl: 1   pravastatin (PRAVACHOL) 40 MG tablet, Take 1 tablet (40 mg total) by mouth daily., Disp: 90 tablet, Rfl: 1   tacrolimus (PROTOPIC) 0.1 % ointment, APPLY TWICE A DAY TO RASH UNDER BREAST AND IN GROIN FOR 2 WEEKS, THEN DECREASE TO EVERY DAY UNTIL CLEAR, THEN PRN FLARES, Disp: 100 g, Rfl: 2   Travoprost, BAK Free, (TRAVATAN) 0.004 % SOLN ophthalmic solution, Place 1 drop into both eyes at bedtime., Disp: , Rfl:    TRUE METRIX BLOOD GLUCOSE TEST test strip, TEST BLOOD SUGAR IN THE MORNING, AT NOON, AND AT BEDTIME, Disp: 100 strip, Rfl: 0   TRUEplus Lancets 33G MISC, USE AS DIRECTED, Disp: 300 each, Rfl: 0   Vitamin D, Ergocalciferol, (DRISDOL) 1.25 MG (50000 UNIT) CAPS capsule, Take 1 capsule (50,000 Units total) by mouth every 7 (seven) days., Disp: 12 capsule, Rfl: 1 No current facility-administered medications for this visit.  Facility-Administered Medications Ordered in Other Visits:    cyanocobalamin (VITAMIN B12) injection 1,000 mcg, 1,000 mcg,  Intramuscular, Q30 days, Creig Hines, MD, 1,000 mcg at 10/14/22 1540  Allergies  Allergen Reactions   Augmentin [Amoxicillin-Pot Clavulanate] Itching    Has patient had a PCN reaction causing immediate rash, facial/tongue/throat swelling, SOB or lightheadedness with hypotension: No Has patient had a PCN reaction causing severe rash involving mucus membranes or skin necrosis: No Has patient had a PCN reaction that required hospitalization: No Has patient had a PCN reaction occurring within the last 10 years: No If all of the above answers are "NO", then may proceed with Cephalosporin use.     I personally reviewed active problem list, medication list, allergies, family history, social history, health maintenance with the patient/caregiver today.   ROS  Ten systems reviewed and is negative except as mentioned in HPI    Objective  Vitals:   04/29/23 1339 04/29/23 1350  BP: (!) 162/82 (!) 160/78  Pulse: 84   Resp: 16   SpO2: 97%   Weight: 188 lb (85.3 kg)   Height: 5\' 4"  (1.626 m)     Body mass index is 32.27 kg/m.  Physical Exam  Constitutional: Patient appears well-developed and well-nourished. Obese  No distress.  HEENT: head atraumatic, normocephalic, pupils equal and reactive to light, neck supple, no temporal artery pain  Cardiovascular: Normal rate, regular rhythm and normal heart sounds.  No murmur heard. No BLE edema. Pulmonary/Chest: Effort normal and breath sounds normal. No respiratory distress. Abdominal: Soft.  There is no tenderness. Neuro: normal cranial nerves, no tenderness during palpation of temporal arteries, normal grip. No paresthesia or focal findings.  Psychiatric: Patient has a normal mood and affect. behavior is normal. Judgment and thought content normal.    PHQ2/9:    04/29/2023    1:39 PM 01/17/2023   10:51 AM 12/05/2022    8:33 AM 07/19/2022   10:51 AM 01/16/2022   10:43 AM  Depression screen PHQ 2/9  Decreased Interest 0 0 0 0 0  Down,  Depressed, Hopeless 0 0 0 0 0  PHQ - 2 Score 0 0 0 0 0  Altered sleeping 0 0  2 0  Tired, decreased energy 0 0  0 0  Change in appetite 0 0  0 0  Feeling bad or failure about  yourself  0 0  0 0  Trouble concentrating 0 0  0 0  Moving slowly or fidgety/restless 0 0  0 0  Suicidal thoughts 0 0  0 0  PHQ-9 Score 0 0  2 0    phq 9 is negative   Fall Risk:    04/29/2023    1:39 PM 01/17/2023   10:33 AM 12/05/2022    8:25 AM 07/19/2022   10:51 AM 01/16/2022   10:43 AM  Fall Risk   Falls in the past year? 0 0 0 0 1  Number falls in past yr: 0  0 0 0  Injury with Fall? 0  0 0 0  Risk for fall due to : No Fall Risks No Fall Risks No Fall Risks No Fall Risks No Fall Risks  Follow up Falls prevention discussed Falls prevention discussed;Education provided;Falls evaluation completed Education provided;Falls prevention discussed Falls prevention discussed Falls prevention discussed      Functional Status Survey: Is the patient deaf or have difficulty hearing?: Yes Does the patient have difficulty seeing, even when wearing glasses/contacts?: Yes Does the patient have difficulty concentrating, remembering, or making decisions?: No Does the patient have difficulty walking or climbing stairs?: No Does the patient have difficulty dressing or bathing?: No Does the patient have difficulty doing errands alone such as visiting a doctor's office or shopping?: No    Assessment & Plan  1. Benign hypertension  Bp is not controlled. We will check cardiac enzymes and if normal, we will start her on prn hydralazine before adjusting regular medications since new onset of bp being out of control .  2. Coronary artery disease involving native coronary artery of native heart with unstable angina pectoris (HCC)  - EKG 12-Lead - Troponin I (High Sensitivity); Future ( results negative)   Called daughter and we will try hydralazine to take prn, however if headache continues or focal deficit call 911 or if  just headache not improving call us back for head CT   - CK Total (and CKMB); Future  3. Left arm pain  Explained it may be from angina symptoms   4. Left leg pain   5. SOB (shortness of breath) on exertion  Rule out heart causes   6. Frontal headache  Explained that if negative cardiac enzymes we will send rx for Hydralazine to take prn if bp above 155/80 but if headaches does not improve she needs to contact us and I will send her for CT head - due to risk of bleed/stroke and also metastatic disease. Daughter and patient agreed with plan

## 2023-04-29 NOTE — Telephone Encounter (Signed)
  Chief Complaint: Hypertension Symptoms: BP this AM 187/91, yesterday 180/85, 171/88. Headache, lightheaded at times, "When I get up and move around." Frequency: Yesterday Pertinent Negatives: Patient denies weakness Disposition: [] ED /[] Urgent Care (no appt availability in office) / [x] Appointment(In office/virtual)/ []  [facility]/ [] Home Care/ [] Refused Recommended Disposition /[] [facility]/ []  Follow-up with PCP Additional Notes: Appt secured for today, care advise provided. Pt verbalizes understanding.  Reason for Disposition  Systolic BP  >= 180 OR Diastolic >= 110  Answer Assessment - Initial Assessment Questions 1. BLOOD PRESSURE: "What is the blood pressure?" "Did you take at least two measurements 5 minutes apart?"     187/91 this Am 2. ONSET: "When did you take your blood pressure?"     AM 3. HOW: "How did you take your blood pressure?" (e.g., automatic home BP monitor, visiting nurse)     Home monitor, arm cuff 4. HISTORY: "Do you have a history of high blood pressure?"     yes 5. MEDICINES: "Are you taking any medicines for blood pressure?" "Have you missed any doses recently?"     Yes, no missed doses 6. OTHER SYMPTOMS: "Do you have any symptoms?" (e.g., blurred vision, chest pain, difficulty breathing, headache, weakness)     Headaches, lightheaded  Protocols used: Blood Pressure - High-A-AH

## 2023-04-30 ENCOUNTER — Telehealth: Payer: Self-pay

## 2023-04-30 NOTE — Telephone Encounter (Signed)
Spoke with patient to follow up from her last visit and she stated she is feeling better today. She stated she checked her BP at 1:00pm today and it was 137/62 with a HR of 69. She stated she would give [place] a call if she has any changes, new or returning of her headache or pain.

## 2023-05-05 ENCOUNTER — Telehealth: Payer: Self-pay | Admitting: Family Medicine

## 2023-05-05 NOTE — Telephone Encounter (Signed)
Chasity at [facility] called saying they do not have the Trulicity 3  .  They do have the Trulicity 1.5 and some Ozempic.  They want to know what the provider wants to do.  CB#  [phone]

## 2023-05-05 NOTE — Telephone Encounter (Signed)
Kathy from [facility] called in about Truilicity. She says they have the 1.5mg  in stock

## 2023-05-05 NOTE — Telephone Encounter (Signed)
Left voicemail, awaiting return call from pharmacy.

## 2023-05-05 NOTE — Telephone Encounter (Signed)
Left voicemail to see if the 1mg  Ozempic is in stock. Awaiting return call.

## 2023-05-07 ENCOUNTER — Other Ambulatory Visit: Payer: Self-pay | Admitting: Family Medicine

## 2023-05-07 MED ORDER — SEMAGLUTIDE (1 MG/DOSE) 4 MG/3ML ~~LOC~~ SOPN: 1.0000 mg | PEN_INJECTOR | SUBCUTANEOUS | 0 refills | Status: DC

## 2023-05-15 NOTE — Progress Notes (Signed)
Name: Susan Bowen   MRN: [id number]    DOB: [birth year]   Date:05/16/2023       Progress Note  Subjective  Chief Complaint  Follow Up  HPI:   She was seen two weeks ago with increase in headaches and bp spikes to 170-180's range. We gave her hydralazine to take prn and advised her to call back if no improvement with medication and 2 weeks for follow up  She has been logging her bp at home and has been between 120-160's, mostly 140's and she takes a hydralazine if needed.  Headache finally resolved 3 days ago.   She takes Benicar 40 , cardizem 120 and heart rage has been controlled. DBP is 60's-70's , heart rate is mostly in the 60's. We will hold off on adjusting doses of regular bp medication since prn hydralazine is working   Small  vessel disease ( Brain MRI): discussed importance of bp staying in the 130's-140's range for her, continue cholesterol medication and Eliquis.   DM: she was on trulicity but due to nationwide shortage was switched to Ozempic but has not been able to use it, she brought her pen today and was instructed on how to use it. 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) 09/18/2021   Iron deficiency anemia 12/04/2020   Carcinoid tumor, malignant (HCC) 09/08/2019   Pain in joint of left shoulder 02/25/2019   Pulmonary hypertension, unspecified (HCC) 12/28/2018   Paroxysmal A-fib (HCC) 12/28/2018   Chronic kidney disease, stage III (moderate) (HCC) 04/01/2018   Elevated parathyroid hormone 01/01/2018   Elevated uric acid in blood 04/29/2017   Plantar fasciitis of left foot 03/12/2016   Postprocedural hypothyroidism 01/23/2016   Aortic stenosis 09/26/2015   Allergic rhinitis, seasonal 08/08/2015   Benign  hypertension 08/08/2015   History of pneumonia 08/08/2015   Carpal tunnel syndrome 08/08/2015   Cataract 08/08/2015   Insomnia, persistent 08/08/2015   Dyslipidemia 08/08/2015   History of depression 08/08/2015   Glaucoma 08/08/2015   Controlled gout 08/08/2015   Fever blister 08/08/2015   Bilateral hearing loss 08/08/2015   Hemorrhoid 08/08/2015   H/O iron deficiency anemia 08/08/2015   Benign neoplasm of colon 08/08/2015   Impingement syndrome of shoulder 08/08/2015   Osteoporosis, post-menopausal 08/08/2015   Generalized OA 08/08/2015   Multinodular goiter 08/08/2015   Asymptomatic varicose veins 08/08/2015   Polyp of vocal cord 08/08/2015   History of vertebral compression fracture 08/08/2015   Diabetes mellitus with proteinuric diabetic nephropathy (HCC) 08/08/2015   LVH (left ventricular hypertrophy) 08/08/2015   Moderate tricuspid regurgitation 08/08/2015   History of radioactive iodine thyroid ablation 08/08/2015   Lung nodule, solitary 05/12/2014   Chronic cough 05/11/2014   Vitamin D deficiency 12/20/2009   Plantar fascial fibromatosis 12/20/2009   Personal history of fall 07/20/2008   Cervical radiculitis 05/11/2008    Past Surgical History:  Procedure Laterality Date   ABDOMINAL HYSTERECTOMY  1986   APPENDECTOMY     BREAST SURGERY Bilateral 1983   biopsy of each side, negative   CARPAL TUNNEL RELEASE     CATARACT EXTRACTION W/PHACO Left 09/03/2017   Procedure: CATARACT EXTRACTION PHACO AND INTRAOCULAR LENS PLACEMENT (IOC)-LEFT DIABETIC;  Surgeon: Galen Manila, MD;  Location: [facility]  ORS;  Service: Ophthalmology;  Laterality: Left;  [place] 01:10.2 AP% 16.7 CDE 11.71 Fluid Pack Lot # [id number]   CATARACT EXTRACTION W/PHACO Right 09/30/2017   Procedure: CATARACT EXTRACTION PHACO AND INTRAOCULAR LENS PLACEMENT (IOC);  Surgeon: Galen Manila, MD;  Location: [facility] ORS;  Service: Ophthalmology;  Laterality: Right;  [place] 01:35.1 AP% 13.1 CDE 12.44 Fluid pack Lot #  [id number]   COLON SURGERY     EYE SURGERY Left 09/03/2017   Cataract extraction   FEMUR FRACTURE SURGERY Left    FRACTURE SURGERY  2001   left femur   HEMORROIDECTOMY     MULTIPLE TOOTH EXTRACTIONS     polyp removed from vocal cord      Family History  Problem Relation Age of Onset   Diabetes Mother    Hypertension Mother    Dementia Mother    Hypertension Son     Social History   Tobacco Use   Smoking status: Never   Smokeless tobacco: Never   Tobacco comments:    Smoking cessation materials not required  Substance Use Topics   Alcohol use: No    Alcohol/week: 0.0 standard drinks of alcohol     Current Outpatient Medications:    acetaminophen (TYLENOL) 500 MG tablet, Take 500 mg by mouth every 6 (six) hours as needed., Disp: , Rfl:    acetaminophen-codeine (TYLENOL #3) 300-30 MG tablet, 1 TAB EVERY 6 HRS AS NEEDED FOR UP TO 7 DAYS FOR PAIN. AS NEEDED FOR CHRONIC PAIN, Disp: 30 tablet, Rfl: 0   Alcohol Swabs (B-D SINGLE USE SWABS REGULAR) PADS, 1 each by Does not apply route 2 (two) times daily., Disp: 100 each, Rfl: 3   apixaban (ELIQUIS) 5 MG TABS tablet, Take 1 tablet (5 mg total) by mouth 2 (two) times daily., Disp: 180 tablet, Rfl: 1   Blood Glucose Calibration (TRUE METRIX LEVEL 1) Low SOLN, 1 each by In Vitro route once for 1 dose., Disp: 1 each, Rfl: 3   Blood Glucose Monitoring Suppl (ACCU-CHEK GUIDE) w/Device KIT, PLEASE SEE ATTACHED FOR DETAILED DIRECTIONS, Disp: , Rfl:    Blood Glucose Monitoring Suppl DEVI, 1 each by Does not apply route in the morning, at noon, and at bedtime. May substitute to any manufacturer covered by patient's insurance., Disp: 1 each, Rfl: 0   carvedilol (COREG) 25 MG tablet, Take 1 tablet (25 mg total) by mouth 2 (two) times daily. New dose, Disp: 180 tablet, Rfl: 1   diltiazem (CARDIZEM CD) 120 MG 24 hr capsule, Take 1 capsule (120 mg total) by mouth daily., Disp: 90 capsule, Rfl: 0   dorzolamide-timolol (COSOPT) 22.3-6.8 MG/ML  ophthalmic solution, Place 1 drop into both eyes 2 (two) times daily., Disp: , Rfl:    empagliflozin (JARDIANCE) 25 MG TABS tablet, Take 1 tablet (25 mg total) by mouth daily., Disp: 90 tablet, Rfl: 1   furosemide (LASIX) 20 MG tablet, Take 1 tablet (20 mg total) by mouth daily., Disp: 90 tablet, Rfl: 1   glucose blood test strip, CHECK FASTING GLUCOSE ONCE EACH MORNING, AND CHECK GLUCOSE AS NEEDED., Disp: , Rfl:    hydrALAZINE (APRESOLINE) 10 MG tablet, Take 1 tablet (10 mg total) by mouth 4 (four) times daily as needed. If BP above 155/80, Disp: 120 tablet, Rfl: 0   ketoconazole (NIZORAL) 2 % cream, APPLY 1 APPLICATION TOPICALLY DAILY, Disp: 60 g, Rfl: 0   levothyroxine (SYNTHROID) 50 MCG tablet, Take 1 tablet (50 mcg total) by mouth daily before breakfast. And take two tablets  twice a week, Disp: 114 tablet, Rfl: 1   loratadine (CLARITIN) 10 MG tablet, Take 1 tablet (10 mg total) by mouth daily., Disp: 90 tablet, Rfl: 1   metFORMIN (GLUCOPHAGE) 850 MG tablet, Take 1 tablet (850 mg total) by mouth 2 (two) times daily., Disp: 180 tablet, Rfl: 1   Misc Natural Products (NEURIVA PO), Take 1 tablet by mouth daily., Disp: , Rfl:    olmesartan (BENICAR) 40 MG tablet, Take 1 tablet (40 mg total) by mouth daily., Disp: 90 tablet, Rfl: 1   potassium chloride (KLOR-CON M) 10 MEQ tablet, Take 1 tablet (10 mEq total) by mouth daily., Disp: 90 tablet, Rfl: 1   pravastatin (PRAVACHOL) 40 MG tablet, Take 1 tablet (40 mg total) by mouth daily., Disp: 90 tablet, Rfl: 1   Semaglutide, 1 MG/DOSE, 4 MG/3ML SOPN, Inject 1 mg as directed once a week., Disp: 3 mL, Rfl: 0   tacrolimus (PROTOPIC) 0.1 % ointment, APPLY TWICE A DAY TO RASH UNDER BREAST AND IN GROIN FOR 2 WEEKS, THEN DECREASE TO EVERY DAY UNTIL CLEAR, THEN PRN FLARES, Disp: 100 g, Rfl: 2   Travoprost, BAK Free, (TRAVATAN) 0.004 % SOLN ophthalmic solution, Place 1 drop into both eyes at bedtime., Disp: , Rfl:    TRUE METRIX BLOOD GLUCOSE TEST test strip, TEST  BLOOD SUGAR IN THE MORNING, AT NOON, AND AT BEDTIME, Disp: 100 strip, Rfl: 0   TRUEplus Lancets 33G MISC, USE AS DIRECTED, Disp: 300 each, Rfl: 0   Vitamin D, Ergocalciferol, (DRISDOL) 1.25 MG (50000 UNIT) CAPS capsule, Take 1 capsule (50,000 Units total) by mouth every 7 (seven) days., Disp: 12 capsule, Rfl: 1 No current facility-administered medications for this visit.  Facility-Administered Medications Ordered in Other Visits:    cyanocobalamin (VITAMIN B12) injection 1,000 mcg, 1,000 mcg, Intramuscular, Q30 days, Creig Hines, MD, 1,000 mcg at 10/14/22 1540  Allergies  Allergen Reactions   Augmentin [Amoxicillin-Pot Clavulanate] Itching    Has patient had a PCN reaction causing immediate rash, facial/tongue/throat swelling, SOB or lightheadedness with hypotension: No Has patient had a PCN reaction causing severe rash involving mucus membranes or skin necrosis: No Has patient had a PCN reaction that required hospitalization: No Has patient had a PCN reaction occurring within the last 10 years: No If all of the above answers are "NO", then may proceed with Cephalosporin use.     I personally reviewed active problem list, medication list, allergies, family history, social history, health maintenance with the patient/caregiver today.   ROS  Ten systems reviewed and is negative except as mentioned in HPI    Objective  Vitals:   05/16/23 1454  BP: (!) 150/78  Pulse: 72  Resp: 14  Temp: (!) 97.5 F (36.4 C)  TempSrc: Oral  SpO2: 98%  Weight: 191 lb 1.6 oz (86.7 kg)  Height: 5\' 4"  (1.626 m)    Body mass index is 32.8 kg/m.  Physical Exam  Constitutional: Patient appears well-developed and well-nourished. Obese  No distress.  HEENT: head atraumatic, normocephalic, pupils equal and reactive to light, neck supple Cardiovascular: Normal rate, regular rhythm and normal heart sounds.  No murmur heard. No BLE edema. Pulmonary/Chest: Effort normal and breath sounds normal. No  respiratory distress. Abdominal: Soft.  There is no tenderness. Psychiatric: Patient has a normal mood and affect. behavior is normal. Judgment and thought content normal.   Recent Results (from the past 2160 hour(s))  CK Total (and CKMB)     Status: None   Collection Time:  04/29/23  2:58 PM  Result Value Ref Range   Total CK 69 38 - 234 U/L   CK, MB 1.7 0.5 - 5.0 ng/mL    Comment: Performed at [facility], [address], [place], [place] [address]  Troponin I (High Sensitivity)     Status: None   Collection Time: 04/29/23  2:58 PM  Result Value Ref Range   Troponin I (High Sensitivity) 4 <18 ng/L    Comment: (NOTE) Elevated high sensitivity troponin I (hsTnI) values and significant  changes across serial measurements may suggest ACS but many other  chronic and acute conditions are known to elevate hsTnI results.  Refer to the Links section for chest pain algorithms and additional  guidance. Performed at [facility], [address] [place], [place] [address]     PHQ2/9:    04/29/2023    1:39 PM 01/17/2023   10:51 AM 12/05/2022    8:33 AM 07/19/2022   10:51 AM 01/16/2022   10:43 AM  Depression screen PHQ 2/9  Decreased Interest 0 0 0 0 0  Down, Depressed, Hopeless 0 0 0 0 0  PHQ - 2 Score 0 0 0 0 0  Altered sleeping 0 0  2 0  Tired, decreased energy 0 0  0 0  Change in appetite 0 0  0 0  Feeling bad or failure about yourself  0 0  0 0  Trouble concentrating 0 0  0 0  Moving slowly or fidgety/restless 0 0  0 0  Suicidal thoughts 0 0  0 0  PHQ-9 Score 0 0  2 0    phq 9 is negative   Fall Risk:    05/16/2023    2:55 PM 04/29/2023    1:39 PM 01/17/2023   10:33 AM 12/05/2022    8:25 AM 07/19/2022   10:51 AM  Fall Risk   Falls in the past year? 0 0 0 0 0  Number falls in past yr:  0  0 0  Injury with Fall?  0  0 0  Risk for fall due to : No Fall Risks No Fall Risks No Fall Risks No Fall Risks No Fall Risks  Follow up Falls prevention discussed Falls  prevention discussed Falls prevention discussed;Education provided;Falls evaluation completed Education provided;Falls prevention discussed Falls prevention discussed     Assessment & Plan  1. Benign hypertension  BP improved   2. Frontal headache  Resolved   3. Small vessel disease (HCC)  Discussed with patient, keep bp 130's-140's   4. Dyslipidemia associated with type 2 diabetes mellitus (HCC)  Given instructions on how to use Ozempic

## 2023-05-16 ENCOUNTER — Ambulatory Visit (INDEPENDENT_AMBULATORY_CARE_PROVIDER_SITE_OTHER): Payer: Medicare HMO | Admitting: Family Medicine

## 2023-05-16 ENCOUNTER — Other Ambulatory Visit: Payer: Self-pay

## 2023-05-16 ENCOUNTER — Encounter: Payer: Self-pay | Admitting: Family Medicine

## 2023-05-16 VITALS — BP 150/78 | HR 72 | Temp 97.5°F | Resp 14 | Ht 64.0 in | Wt 191.1 lb

## 2023-05-16 DIAGNOSIS — I739 Peripheral vascular disease, unspecified: Secondary | ICD-10-CM

## 2023-05-16 DIAGNOSIS — R519 Headache, unspecified: Secondary | ICD-10-CM | POA: Diagnosis not present

## 2023-05-16 DIAGNOSIS — E785 Hyperlipidemia, unspecified: Secondary | ICD-10-CM | POA: Diagnosis not present

## 2023-05-16 DIAGNOSIS — M545 Low back pain, unspecified: Secondary | ICD-10-CM

## 2023-05-16 DIAGNOSIS — I10 Essential (primary) hypertension: Secondary | ICD-10-CM | POA: Diagnosis not present

## 2023-05-16 DIAGNOSIS — E1169 Type 2 diabetes mellitus with other specified complication: Secondary | ICD-10-CM | POA: Diagnosis not present

## 2023-05-16 MED ORDER — ACETAMINOPHEN-CODEINE 300-30 MG PO TABS
1.0000 | ORAL_TABLET | Freq: Four times a day (QID) | ORAL | 0 refills | Status: DC | PRN
Start: 2023-05-16 — End: 2023-07-17

## 2023-05-19 ENCOUNTER — Inpatient Hospital Stay: Payer: Medicare HMO | Admitting: Oncology

## 2023-05-19 ENCOUNTER — Inpatient Hospital Stay: Payer: Medicare HMO

## 2023-05-19 ENCOUNTER — Inpatient Hospital Stay: Payer: Medicare HMO | Attending: Oncology

## 2023-05-19 ENCOUNTER — Other Ambulatory Visit: Payer: Self-pay | Admitting: Oncology

## 2023-05-19 ENCOUNTER — Encounter: Payer: Self-pay | Admitting: Oncology

## 2023-05-19 VITALS — BP 142/82 | HR 70 | Temp 95.8°F | Resp 18 | Ht 64.0 in | Wt 191.2 lb

## 2023-05-19 DIAGNOSIS — E039 Hypothyroidism, unspecified: Secondary | ICD-10-CM | POA: Diagnosis not present

## 2023-05-19 DIAGNOSIS — I129 Hypertensive chronic kidney disease with stage 1 through stage 4 chronic kidney disease, or unspecified chronic kidney disease: Secondary | ICD-10-CM | POA: Insufficient documentation

## 2023-05-19 DIAGNOSIS — R599 Enlarged lymph nodes, unspecified: Secondary | ICD-10-CM | POA: Insufficient documentation

## 2023-05-19 DIAGNOSIS — R1904 Left lower quadrant abdominal swelling, mass and lump: Secondary | ICD-10-CM | POA: Insufficient documentation

## 2023-05-19 DIAGNOSIS — Z79890 Hormone replacement therapy (postmenopausal): Secondary | ICD-10-CM | POA: Diagnosis not present

## 2023-05-19 DIAGNOSIS — C7A019 Malignant carcinoid tumor of the small intestine, unspecified portion: Secondary | ICD-10-CM

## 2023-05-19 DIAGNOSIS — M159 Polyosteoarthritis, unspecified: Secondary | ICD-10-CM | POA: Insufficient documentation

## 2023-05-19 DIAGNOSIS — Z8673 Personal history of transient ischemic attack (TIA), and cerebral infarction without residual deficits: Secondary | ICD-10-CM | POA: Insufficient documentation

## 2023-05-19 DIAGNOSIS — Z79899 Other long term (current) drug therapy: Secondary | ICD-10-CM | POA: Diagnosis not present

## 2023-05-19 DIAGNOSIS — E1122 Type 2 diabetes mellitus with diabetic chronic kidney disease: Secondary | ICD-10-CM | POA: Insufficient documentation

## 2023-05-19 DIAGNOSIS — E538 Deficiency of other specified B group vitamins: Secondary | ICD-10-CM | POA: Insufficient documentation

## 2023-05-19 DIAGNOSIS — R197 Diarrhea, unspecified: Secondary | ICD-10-CM | POA: Insufficient documentation

## 2023-05-19 DIAGNOSIS — I480 Paroxysmal atrial fibrillation: Secondary | ICD-10-CM | POA: Diagnosis not present

## 2023-05-19 DIAGNOSIS — I272 Pulmonary hypertension, unspecified: Secondary | ICD-10-CM | POA: Diagnosis not present

## 2023-05-19 DIAGNOSIS — Z79621 Long term (current) use of calcineurin inhibitor: Secondary | ICD-10-CM | POA: Insufficient documentation

## 2023-05-19 DIAGNOSIS — M810 Age-related osteoporosis without current pathological fracture: Secondary | ICD-10-CM | POA: Diagnosis not present

## 2023-05-19 DIAGNOSIS — Z7984 Long term (current) use of oral hypoglycemic drugs: Secondary | ICD-10-CM | POA: Insufficient documentation

## 2023-05-19 DIAGNOSIS — D509 Iron deficiency anemia, unspecified: Secondary | ICD-10-CM | POA: Diagnosis not present

## 2023-05-19 DIAGNOSIS — N189 Chronic kidney disease, unspecified: Secondary | ICD-10-CM | POA: Insufficient documentation

## 2023-05-19 DIAGNOSIS — C7B00 Secondary carcinoid tumors, unspecified site: Secondary | ICD-10-CM

## 2023-05-19 DIAGNOSIS — E785 Hyperlipidemia, unspecified: Secondary | ICD-10-CM | POA: Diagnosis not present

## 2023-05-19 DIAGNOSIS — D508 Other iron deficiency anemias: Secondary | ICD-10-CM

## 2023-05-19 DIAGNOSIS — Z7901 Long term (current) use of anticoagulants: Secondary | ICD-10-CM | POA: Diagnosis not present

## 2023-05-19 DIAGNOSIS — K219 Gastro-esophageal reflux disease without esophagitis: Secondary | ICD-10-CM | POA: Insufficient documentation

## 2023-05-19 LAB — CBC WITH DIFFERENTIAL/PLATELET
Abs Immature Granulocytes: 0.02 10*3/uL (ref 0.00–0.07)
Basophils Absolute: 0 10*3/uL (ref 0.0–0.1)
Basophils Relative: 1 %
Eosinophils Absolute: 0.1 10*3/uL (ref 0.0–0.5)
Eosinophils Relative: 1 %
HCT: 38.7 % (ref 36.0–46.0)
Hemoglobin: 12 g/dL (ref 12.0–15.0)
Immature Granulocytes: 0 %
Lymphocytes Relative: 28 %
Lymphs Abs: 2.2 10*3/uL (ref 0.7–4.0)
MCH: 26.9 pg (ref 26.0–34.0)
MCHC: 31 g/dL (ref 30.0–36.0)
MCV: 86.8 fL (ref 80.0–100.0)
Monocytes Absolute: 0.5 10*3/uL (ref 0.1–1.0)
Monocytes Relative: 6 %
Neutro Abs: 5 10*3/uL (ref 1.7–7.7)
Neutrophils Relative %: 64 %
Platelets: 265 10*3/uL (ref 150–400)
RBC: 4.46 MIL/uL (ref 3.87–5.11)
RDW: 15.9 % — ABNORMAL HIGH (ref 11.5–15.5)
WBC: 7.8 10*3/uL (ref 4.0–10.5)
nRBC: 0 % (ref 0.0–0.2)

## 2023-05-19 LAB — COMPREHENSIVE METABOLIC PANEL
ALT: 9 U/L (ref 0–44)
AST: 13 U/L — ABNORMAL LOW (ref 15–41)
Albumin: 3.5 g/dL (ref 3.5–5.0)
Alkaline Phosphatase: 56 U/L (ref 38–126)
Anion gap: 6 (ref 5–15)
BUN: 11 mg/dL (ref 8–23)
CO2: 25 mmol/L (ref 22–32)
Calcium: 9.5 mg/dL (ref 8.9–10.3)
Chloride: 107 mmol/L (ref 98–111)
Creatinine, Ser: 0.98 mg/dL (ref 0.44–1.00)
GFR, Estimated: 55 mL/min — ABNORMAL LOW (ref >60)
Glucose, Bld: 135 mg/dL — ABNORMAL HIGH (ref 70–99)
Potassium: 3.7 mmol/L (ref 3.5–5.1)
Sodium: 138 mmol/L (ref 135–145)
Total Bilirubin: 0.3 mg/dL (ref 0.3–1.2)
Total Protein: 6.9 g/dL (ref 6.5–8.1)

## 2023-05-19 LAB — VITAMIN B12: Vitamin B-12: 204 pg/mL (ref 180–914)

## 2023-05-19 LAB — FOLATE: Folate: 10 ng/mL (ref >5.9)

## 2023-05-19 MED ORDER — CYANOCOBALAMIN 1000 MCG/ML IJ SOLN
1000.0000 ug | Freq: Once | INTRAMUSCULAR | Status: AC
  Filled 2023-05-19: qty 1

## 2023-05-19 MED ORDER — CYANOCOBALAMIN 1000 MCG/ML IJ SOLN
1000.0000 ug | INTRAMUSCULAR | Status: AC
  Administered 2023-05-19: 1000 ug via INTRAMUSCULAR

## 2023-05-19 NOTE — Progress Notes (Signed)
Hematology/Oncology Consult note [facility]  [phone] [phone]  Patient Care Team: Alba Cory, MD as PCP - General (Family Medicine) Iran Ouch, MD as PCP - Cardiology (Cardiology) Creig Hines, MD as Consulting Physician (Oncology) Earna Coder, MD as Consulting Physician (Internal Medicine) Willeen Niece, MD (Dermatology) Creig Hines, MD as Consulting Physician (Hematology and Oncology) Gaspar Cola, [facility] (Inactive) as Pharmacist (Pharmacist)   Name of the patient: Susan Bowen  [id number]  [birth year]   Date of visit: 05/19/23  Diagnosis- 1. intra abdominal adenopathy- metastatic carcinoid on surveillance 2.  Iron and B12 deficiency anemia  Chief complaint/ Reason for visit-routine follow-up of carcinoid and B12 deficiency anemia  Heme/Onc history: Patient is a 89 year old female who underwent CT renal stone study for flank pain.  CT scan showed pathologically enlarged lymph nodes in the right lower quadrant measuring up to 2.6 cm.  Findings concerning for lymphoma or malignant process.  Patient's past medical history significant for pulmonary hypertension, CKD, hypothyroidism among other medical problems.  She did not have any B symptoms.  This was followed by a PET CT scan Which showed 3 prominent soft tissue nodule/lymph nodes one in the mesentery at 2.1 cm with an SUV of 2.8.  Adjacent soft tissue nodule/lymph node in the right iliac fossa measuring 3.1 cm and SUV of 2.8.  Predominantly fat attenuation mesenteric nodule in the left lower quadrant measuring 2 cm without any significant FDG uptake   She remains on surveillance and has not started any somatostatin therapy yet    Interval history-she is doing well for her age and denies any specific complaints at this time.  Has occasional diarrhea which is self-limited.  Bowel movements are otherwise regular.  Appetite and weight have remained  stable.  ECOG PS- 1 Pain scale- 0   Review of systems- Review of Systems  Constitutional:  Negative for chills, fever, malaise/fatigue and weight loss.  HENT:  Negative for congestion, ear discharge and nosebleeds.   Eyes:  Negative for blurred vision.  Respiratory:  Negative for cough, hemoptysis, sputum production, shortness of breath and wheezing.   Cardiovascular:  Negative for chest pain, palpitations, orthopnea and claudication.  Gastrointestinal:  Negative for abdominal pain, blood in stool, constipation, diarrhea, heartburn, melena, nausea and vomiting.  Genitourinary:  Negative for dysuria, flank pain, frequency, hematuria and urgency.  Musculoskeletal:  Negative for back pain, joint pain and myalgias.  Skin:  Negative for rash.  Neurological:  Negative for dizziness, tingling, focal weakness, seizures, weakness and headaches.  Endo/Heme/Allergies:  Does not bruise/bleed easily.  Psychiatric/Behavioral:  Negative for depression and suicidal ideas. The patient does not have insomnia.       Allergies  Allergen Reactions   Augmentin [Amoxicillin-Pot Clavulanate] Itching    Has patient had a PCN reaction causing immediate rash, facial/tongue/throat swelling, SOB or lightheadedness with hypotension: No Has patient had a PCN reaction causing severe rash involving mucus membranes or skin necrosis: No Has patient had a PCN reaction that required hospitalization: No Has patient had a PCN reaction occurring within the last 10 years: No If all of the above answers are "NO", then may proceed with Cephalosporin use.      Past Medical History:  Diagnosis Date   Allergy    Back spasm    Bunion    Carpal tunnel syndrome    Cataracta    Diabetes mellitus without complication (HCC)    Generalized osteoarthritis    GERD (  gastroesophageal reflux disease)    Gout    Hemorrhoids without complication    Hyperlipidemia    Hypertension    Hypothyroidism    Impingement syndrome of  right shoulder    Insomnia    Lipoma    Lung nodule    Metastatic carcinoid tumor (HCC) 03/2016   Neuritis or radiculitis due to rupture of lumbar intervertebral disc    Obesity    Osteoporosis    PAF (paroxysmal atrial fibrillation) (HCC)    a. 03/2018 AF w/ RVR-->converted spont.  CHA2DS2VASc = 7-->Eliquis.   Proteinuria    Rotator cuff tear    Systolic murmur    a. 03/2018 Echo: EF 60-65%, no rwma, Gr1 DD, mild MR, mildly dil LA. Nl RV fxn. PASP .   TIA (transient ischemic attack)    TIA (transient ischemic attack) 1998   small TIA.  no residual effects   Tinnitus of both ears    Unspecified glaucoma(365.9)    Vitamin D deficiency    Wedge compression fracture of t11-T12 vertebra, sequela      Past Surgical History:  Procedure Laterality Date   ABDOMINAL HYSTERECTOMY  1986   APPENDECTOMY     BREAST SURGERY Bilateral 1983   biopsy of each side, negative   CARPAL TUNNEL RELEASE     CATARACT EXTRACTION W/PHACO Left 09/03/2017   Procedure: CATARACT EXTRACTION PHACO AND INTRAOCULAR LENS PLACEMENT (IOC)-LEFT DIABETIC;  Surgeon: Galen Manila, MD;  Location: [facility] ORS;  Service: Ophthalmology;  Laterality: Left;  [place] 01:10.2 AP% 16.7 CDE 11.71 Fluid Pack Lot # [id number]   CATARACT EXTRACTION W/PHACO Right 09/30/2017   Procedure: CATARACT EXTRACTION PHACO AND INTRAOCULAR LENS PLACEMENT (IOC);  Surgeon: Galen Manila, MD;  Location: [facility] ORS;  Service: Ophthalmology;  Laterality: Right;  [place] 01:35.1 AP% 13.1 CDE 12.44 Fluid pack Lot # [id number]   COLON SURGERY     EYE SURGERY Left 09/03/2017   Cataract extraction   FEMUR FRACTURE SURGERY Left    FRACTURE SURGERY  2001   left femur   HEMORROIDECTOMY     MULTIPLE TOOTH EXTRACTIONS     polyp removed from vocal cord      Social History   Socioeconomic History   Marital status: Divorced    Spouse name: Not on file   Number of children: 6   Years of education: Not on file   Highest education level: 10th grade   Occupational History   Not on file  Tobacco Use   Smoking status: Never   Smokeless tobacco: Never   Tobacco comments:    Smoking cessation materials not required  Vaping Use   Vaping status: Never Used  Substance and Sexual Activity   Alcohol use: No    Alcohol/week: 0.0 standard drinks of alcohol   Drug use: No   Sexual activity: Not Currently  Other Topics Concern   Not on file  Social History Narrative   Pt lives with her son; independent ADL's, no longer drives   Social Determinants of Health   Financial Resource Strain: Low Risk  (02/05/2023)   Overall Financial Resource Strain (CARDIA)    Difficulty of Paying Living Expenses: Not hard at all  Recent Concern: Financial Resource Strain - High Risk (12/05/2022)   Overall Financial Resource Strain (CARDIA)    Difficulty of Paying Living Expenses: Very hard  Food Insecurity: No Food Insecurity (12/05/2022)   Hunger Vital Sign    Worried About Running Out of Food in the Last Year: Never true  Ran Out of Food in the Last Year: Never true  Transportation Needs: No Transportation Needs (02/05/2023)   PRAPARE - Administrator, Civil Service (Medical): No    Lack of Transportation (Non-Medical): No  Physical Activity: Insufficiently Active (12/05/2022)   Exercise Vital Sign    Days of Exercise per Week: 7 days    Minutes of Exercise per Session: 20 min  Stress: No Stress Concern Present (12/05/2022)   [employer] of Occupational Health - Occupational Stress Questionnaire    Feeling of Stress : Not at all  Social Connections: Moderately Integrated (12/05/2022)   Social Connection and Isolation Panel [NHANES]    Frequency of Communication with Friends and Family: More than three times a week    Frequency of Social Gatherings with Friends and Family: More than three times a week    Attends Religious Services: More than 4 times per year    Active Member of [employer] or Organizations: Yes    Attends Banker  Meetings: 1 to 4 times per year    Marital Status: Divorced  Intimate Partner Violence: Not At Risk (12/05/2022)   Humiliation, Afraid, Rape, and Kick questionnaire    Fear of Current or Ex-Partner: No    Emotionally Abused: No    Physically Abused: No    Sexually Abused: No    Family History  Problem Relation Age of Onset   Diabetes Mother    Hypertension Mother    Dementia Mother    Hypertension Son      Current Outpatient Medications:    acetaminophen (TYLENOL) 500 MG tablet, Take 500 mg by mouth every 6 (six) hours as needed., Disp: , Rfl:    acetaminophen-codeine (TYLENOL #3) 300-30 MG tablet, Take 1 tablet by mouth every 6 (six) hours as needed for moderate pain., Disp: 30 tablet, Rfl: 0   Alcohol Swabs (B-D SINGLE USE SWABS REGULAR) PADS, 1 each by Does not apply route 2 (two) times daily., Disp: 100 each, Rfl: 3   apixaban (ELIQUIS) 5 MG TABS tablet, Take 1 tablet (5 mg total) by mouth 2 (two) times daily., Disp: 180 tablet, Rfl: 1   Blood Glucose Calibration (TRUE METRIX LEVEL 1) Low SOLN, 1 each by In Vitro route once for 1 dose., Disp: 1 each, Rfl: 3   Blood Glucose Monitoring Suppl (ACCU-CHEK GUIDE) w/Device KIT, PLEASE SEE ATTACHED FOR DETAILED DIRECTIONS, Disp: , Rfl:    Blood Glucose Monitoring Suppl DEVI, 1 each by Does not apply route in the morning, at noon, and at bedtime. May substitute to any manufacturer covered by patient's insurance., Disp: 1 each, Rfl: 0   carvedilol (COREG) 25 MG tablet, Take 1 tablet (25 mg total) by mouth 2 (two) times daily. New dose, Disp: 180 tablet, Rfl: 1   diltiazem (CARDIZEM CD) 120 MG 24 hr capsule, Take 1 capsule (120 mg total) by mouth daily., Disp: 90 capsule, Rfl: 0   dorzolamide-timolol (COSOPT) 22.3-6.8 MG/ML ophthalmic solution, Place 1 drop into both eyes 2 (two) times daily., Disp: , Rfl:    empagliflozin (JARDIANCE) 25 MG TABS tablet, Take 1 tablet (25 mg total) by mouth daily., Disp: 90 tablet, Rfl: 1   furosemide (LASIX) 20  MG tablet, Take 1 tablet (20 mg total) by mouth daily., Disp: 90 tablet, Rfl: 1   glucose blood test strip, CHECK FASTING GLUCOSE ONCE EACH MORNING, AND CHECK GLUCOSE AS NEEDED., Disp: , Rfl:    hydrALAZINE (APRESOLINE) 10 MG tablet, Take 1 tablet (10 mg  total) by mouth 4 (four) times daily as needed. If BP above 155/80, Disp: 120 tablet, Rfl: 0   ketoconazole (NIZORAL) 2 % cream, APPLY 1 APPLICATION TOPICALLY DAILY, Disp: 60 g, Rfl: 0   levothyroxine (SYNTHROID) 50 MCG tablet, Take 1 tablet (50 mcg total) by mouth daily before breakfast. And take two tablets twice a week, Disp: 114 tablet, Rfl: 1   loratadine (CLARITIN) 10 MG tablet, Take 1 tablet (10 mg total) by mouth daily., Disp: 90 tablet, Rfl: 1   metFORMIN (GLUCOPHAGE) 850 MG tablet, Take 1 tablet (850 mg total) by mouth 2 (two) times daily., Disp: 180 tablet, Rfl: 1   Misc Natural Products (NEURIVA PO), Take 1 tablet by mouth daily., Disp: , Rfl:    olmesartan (BENICAR) 40 MG tablet, Take 1 tablet (40 mg total) by mouth daily., Disp: 90 tablet, Rfl: 1   potassium chloride (KLOR-CON M) 10 MEQ tablet, Take 1 tablet (10 mEq total) by mouth daily., Disp: 90 tablet, Rfl: 1   pravastatin (PRAVACHOL) 40 MG tablet, Take 1 tablet (40 mg total) by mouth daily., Disp: 90 tablet, Rfl: 1   Semaglutide, 1 MG/DOSE, 4 MG/3ML SOPN, Inject 1 mg as directed once a week., Disp: 3 mL, Rfl: 0   tacrolimus (PROTOPIC) 0.1 % ointment, APPLY TWICE A DAY TO RASH UNDER BREAST AND IN GROIN FOR 2 WEEKS, THEN DECREASE TO EVERY DAY UNTIL CLEAR, THEN PRN FLARES, Disp: 100 g, Rfl: 2   Travoprost, BAK Free, (TRAVATAN) 0.004 % SOLN ophthalmic solution, Place 1 drop into both eyes at bedtime., Disp: , Rfl:    TRUE METRIX BLOOD GLUCOSE TEST test strip, TEST BLOOD SUGAR IN THE MORNING, AT NOON, AND AT BEDTIME, Disp: 100 strip, Rfl: 0   TRUEplus Lancets 33G MISC, USE AS DIRECTED, Disp: 300 each, Rfl: 0   Vitamin D, Ergocalciferol, (DRISDOL) 1.25 MG (50000 UNIT) CAPS capsule, Take  1 capsule (50,000 Units total) by mouth every 7 (seven) days., Disp: 12 capsule, Rfl: 1 No current facility-administered medications for this visit.  Facility-Administered Medications Ordered in Other Visits:    cyanocobalamin (VITAMIN B12) injection 1,000 mcg, 1,000 mcg, Intramuscular, Q30 days, Creig Hines, MD, 1,000 mcg at 10/14/22 1540   cyanocobalamin (VITAMIN B12) injection 1,000 mcg, 1,000 mcg, Intramuscular, Once, Creig Hines, MD   cyanocobalamin (VITAMIN B12) injection 1,000 mcg, 1,000 mcg, Intramuscular, Q30 days, Creig Hines, MD, 1,000 mcg at 05/19/23 1131  Physical exam:  Vitals:   05/19/23 0959 05/19/23 1003  BP: (!) 153/82 (!) 142/82  Pulse: 72 70  Resp: 18   Temp: (!) 95.8 F (35.4 C)   TempSrc: Tympanic   SpO2: 100%   Weight: 191 lb 3.2 oz (86.7 kg)   Height: 5\' 4"  (1.626 m)    Physical Exam Cardiovascular:     Rate and Rhythm: Normal rate and regular rhythm.     Heart sounds: Normal heart sounds.  Pulmonary:     Effort: Pulmonary effort is normal.     Breath sounds: Normal breath sounds.  Abdominal:     General: Bowel sounds are normal.     Palpations: Abdomen is soft.  Skin:    General: Skin is warm and dry.  Neurological:     Mental Status: She is alert and oriented to person, place, and time.         Latest Ref Rng & Units 05/19/2023    9:46 AM  CMP  Glucose 70 - 99 mg/dL 175   BUN 8 -  23 mg/dL 11   Creatinine 7.82 - 1.00 mg/dL 9.56   Sodium 213 - 086 mmol/L 138   Potassium 3.5 - 5.1 mmol/L 3.7   Chloride 98 - 111 mmol/L 107   CO2 22 - 32 mmol/L 25   Calcium 8.9 - 10.3 mg/dL 9.5   Total Protein 6.5 - 8.1 g/dL 6.9   Total Bilirubin 0.3 - 1.2 mg/dL 0.3   Alkaline Phos 38 - 126 U/L 56   AST 15 - 41 U/L 13   ALT 0 - 44 U/L 9       Latest Ref Rng & Units 05/19/2023    9:46 AM  CBC  WBC 4.0 - 10.5 K/uL 7.8   Hemoglobin 12.0 - 15.0 g/dL 57.8   Hematocrit 46.9 - 46.0 % 38.7   Platelets 150 - 400 K/uL 265     No images are attached  to the encounter.  No results found.   Assessment and plan- Patient is a 89 y.o. female Who is here for follow-up of following issues:  Carcinoid of the small bowel: Diagnosed in 2020.  She remains on active surveillance and has not required any treatment for this so far.  Her last scan in July 2023 did not show any progressive disease.  I will plan to repeat a scan again at this time.  I will see her back in 1 year with CBC with differential CMP and chromogranin A  2.  History of B12 deficiency: Continue monthly B12 injections   Visit Diagnosis 1. Metastatic carcinoid tumor (HCC)   2. B12 deficiency   3. Malignant carcinoid tumor of small intestine, unspecified location [facility])      Dr. Owens Shark, MD, MPH [facility] at [facility] [phone] 05/19/2023 3:22 PM

## 2023-05-20 ENCOUNTER — Encounter: Payer: Self-pay | Admitting: Pharmacist

## 2023-05-20 NOTE — Progress Notes (Signed)
Patient previously followed by [facility] pharmacist. Per clinical review, no pharmacist appointment needed at this time. Care guide directed to contact patient and cancel appointment and notify pharmacy team of any patient concerns.  

## 2023-05-21 ENCOUNTER — Other Ambulatory Visit: Payer: Self-pay | Admitting: Family Medicine

## 2023-05-21 DIAGNOSIS — I10 Essential (primary) hypertension: Secondary | ICD-10-CM

## 2023-05-23 NOTE — Telephone Encounter (Signed)
Left voicemail. Awaiting return call for clarification on how often she is taking the medication per Dr. Carlynn Purl.

## 2023-05-26 ENCOUNTER — Other Ambulatory Visit: Payer: Self-pay | Admitting: Family Medicine

## 2023-05-30 ENCOUNTER — Ambulatory Visit
Admission: RE | Admit: 2023-05-30 | Discharge: 2023-05-30 | Disposition: A | Discharge status: Home / Self Care | Payer: Medicare HMO | Source: Ambulatory Visit | Attending: Oncology | Admitting: Oncology

## 2023-05-30 DIAGNOSIS — D3502 Benign neoplasm of left adrenal gland: Secondary | ICD-10-CM | POA: Diagnosis not present

## 2023-05-30 DIAGNOSIS — R599 Enlarged lymph nodes, unspecified: Secondary | ICD-10-CM | POA: Diagnosis not present

## 2023-05-30 DIAGNOSIS — C7B00 Secondary carcinoid tumors, unspecified site: Secondary | ICD-10-CM | POA: Insufficient documentation

## 2023-05-30 DIAGNOSIS — E538 Deficiency of other specified B group vitamins: Secondary | ICD-10-CM | POA: Insufficient documentation

## 2023-05-30 DIAGNOSIS — I700 Atherosclerosis of aorta: Secondary | ICD-10-CM | POA: Diagnosis not present

## 2023-05-30 DIAGNOSIS — C7A00 Malignant carcinoid tumor of unspecified site: Secondary | ICD-10-CM | POA: Diagnosis not present

## 2023-05-30 MED ORDER — IOHEXOL 300 MG/ML  SOLN
100.0000 mL | Freq: Once | INTRAMUSCULAR | Status: AC | PRN
  Administered 2023-05-30: 100 mL via INTRAVENOUS

## 2023-06-19 ENCOUNTER — Other Ambulatory Visit: Payer: Self-pay | Admitting: Family Medicine

## 2023-06-19 ENCOUNTER — Encounter: Payer: Self-pay | Admitting: Cardiovascular Disease

## 2023-06-19 ENCOUNTER — Inpatient Hospital Stay: Payer: Medicare HMO | Attending: Oncology

## 2023-06-19 ENCOUNTER — Ambulatory Visit: Payer: Medicare HMO | Attending: Cardiovascular Disease | Admitting: Cardiovascular Disease

## 2023-06-19 VITALS — BP 148/90 | HR 65 | Ht 64.0 in | Wt 192.0 lb

## 2023-06-19 DIAGNOSIS — D508 Other iron deficiency anemias: Secondary | ICD-10-CM

## 2023-06-19 DIAGNOSIS — I5032 Chronic diastolic (congestive) heart failure: Secondary | ICD-10-CM | POA: Diagnosis not present

## 2023-06-19 DIAGNOSIS — I480 Paroxysmal atrial fibrillation: Secondary | ICD-10-CM | POA: Diagnosis not present

## 2023-06-19 DIAGNOSIS — E785 Hyperlipidemia, unspecified: Secondary | ICD-10-CM | POA: Diagnosis not present

## 2023-06-19 DIAGNOSIS — E538 Deficiency of other specified B group vitamins: Secondary | ICD-10-CM | POA: Insufficient documentation

## 2023-06-19 DIAGNOSIS — I10 Essential (primary) hypertension: Secondary | ICD-10-CM

## 2023-06-19 MED ORDER — CYANOCOBALAMIN 1000 MCG/ML IJ SOLN
1000.0000 ug | INTRAMUSCULAR | Status: DC
  Administered 2023-06-19: 1000 ug via INTRAMUSCULAR
  Filled 2023-06-19: qty 1

## 2023-06-19 MED ORDER — SPIRONOLACTONE 25 MG PO TABS: 25.0000 mg | ORAL_TABLET | Freq: Every day | ORAL | 3 refills | Status: DC

## 2023-06-19 NOTE — Progress Notes (Signed)
Cardiology Office Note   Date:  06/19/2023   ID:  Susan Bowen, DOB 1932/04/29, MRN [id number]  PCP:  Susan Cory, MD  Cardiologist:  Susan Bears, MD   Chief Complaint  Patient presents with   Follow-up    6 month f/u c/o elevated BP. Meds reviewed verbally with pt.       History of Present Illness: Susan Bowen is a 89 y.o. female who presents for a follow-up visit regarding paroxysmal atrial fibrillation chronic diastolic heart failure. She has multiple chronic medical conditions that include hypertension, diabetes and hyperlipidemia.   She is not a smoker and has no family history of premature coronary artery disease. She was hospitalized in May, 2019 with chest pain in the setting of A. fib with RVR.  The dose of carvedilol was increased to 12.5 mg twice daily and she was started on Eliquis for anticoagulation.  Echocardiogram showed normal LV systolic function with grade 1 diastolic dysfunction. Lexiscan Myoview in June 2019 showed no evidence of ischemia with normal ejection fraction.  In 2020, she was diagnosed with carcinoid tumor with metastasis.  This was an incidental finding on CT of the abdomen which was done for renal stone .  This has been monitored by oncology without active treatment.  Echocardiogram in August 2022 showed normal LV systolic function, grade 1 diastolic dysfunction and moderate to severe pulmonary hypertension.    She had an emergency room visit in November, 2023 for A-fib with RVR.  She improved with IV diltiazem.   She was discharged home on diltiazem extended release 120 mg once daily. During her last follow-up visit, she was in sinus rhythm and I discontinued amlodipine to avoid being on 2 calcium channel blockers.  She has been having elevated blood pressure over the last few months with associated headache.  She was seen by Dr. Carlynn Bowen in June and was given small dose hydralazine but she did not tolerate the medication due to GI  symptoms and palpitations.  She denies chest pain and reports stable exertional dyspnea.  Her blood pressure is still elevated.  Past Medical History:  Diagnosis Date   Allergy    Back spasm    Bunion    Carpal tunnel syndrome    Cataracta    Diabetes mellitus without complication (HCC)    Generalized osteoarthritis    GERD (gastroesophageal reflux disease)    Gout    Hemorrhoids without complication    Hyperlipidemia    Hypertension    Hypothyroidism    Impingement syndrome of right shoulder    Insomnia    Lipoma    Lung nodule    Metastatic carcinoid tumor (HCC) 03/2016   Neuritis or radiculitis due to rupture of lumbar intervertebral disc    Obesity    Osteoporosis    PAF (paroxysmal atrial fibrillation) (HCC)    a. 03/2018 AF w/ RVR-->converted spont.  CHA2DS2VASc = 7-->Eliquis.   Proteinuria    Rotator cuff tear    Systolic murmur    a. 03/2018 Echo: EF 60-65%, no rwma, Gr1 DD, mild MR, mildly dil LA. Nl RV fxn. PASP .   TIA (transient ischemic attack)    TIA (transient ischemic attack) 1998   small TIA.  no residual effects   Tinnitus of both ears    Unspecified glaucoma(365.9)    Vitamin D deficiency    Wedge compression fracture of t11-T12 vertebra, sequela     Past Surgical History:  Procedure Laterality Date  ABDOMINAL HYSTERECTOMY  1986   APPENDECTOMY     BREAST SURGERY Bilateral 1983   biopsy of each side, negative   CARPAL TUNNEL RELEASE     CATARACT EXTRACTION W/PHACO Left 09/03/2017   Procedure: CATARACT EXTRACTION PHACO AND INTRAOCULAR LENS PLACEMENT (IOC)-LEFT DIABETIC;  Surgeon: Susan Manila, MD;  Location: [facility] ORS;  Service: Ophthalmology;  Laterality: Left;  [place] 01:10.2 AP% 16.7 CDE 11.71 Fluid Pack Lot # [id number]   CATARACT EXTRACTION W/PHACO Right 09/30/2017   Procedure: CATARACT EXTRACTION PHACO AND INTRAOCULAR LENS PLACEMENT (IOC);  Surgeon: Susan Manila, MD;  Location: [facility] ORS;  Service: Ophthalmology;  Laterality: Right;   [place] 01:35.1 AP% 13.1 CDE 12.44 Fluid pack Lot # [id number]   COLON SURGERY     EYE SURGERY Left 09/03/2017   Cataract extraction   FEMUR FRACTURE SURGERY Left    FRACTURE SURGERY  2001   left femur   HEMORROIDECTOMY     MULTIPLE TOOTH EXTRACTIONS     polyp removed from vocal cord       Current Outpatient Medications  Medication Sig Dispense Refill   acetaminophen (TYLENOL) 500 MG tablet Take 500 mg by mouth every 6 (six) hours as needed.     acetaminophen-codeine (TYLENOL #3) 300-30 MG tablet Take 1 tablet by mouth every 6 (six) hours as needed for moderate pain. 30 tablet 0   Alcohol Swabs (B-D SINGLE USE SWABS REGULAR) PADS 1 each by Does not apply route 2 (two) times daily. 100 each 3   apixaban (ELIQUIS) 5 MG TABS tablet Take 1 tablet (5 mg total) by mouth 2 (two) times daily. 180 tablet 1   Blood Glucose Calibration (TRUE METRIX LEVEL 1) Low SOLN 1 each by In Vitro route once for 1 dose. 1 each 3   Blood Glucose Monitoring Suppl (ACCU-CHEK GUIDE) w/Device KIT PLEASE SEE ATTACHED FOR DETAILED DIRECTIONS     Blood Glucose Monitoring Suppl DEVI 1 each by Does not apply route in the morning, at noon, and at bedtime. May substitute to any manufacturer covered by patient's insurance. 1 each 0   carvedilol (COREG) 25 MG tablet Take 1 tablet (25 mg total) by mouth 2 (two) times daily. New dose 180 tablet 1   diltiazem (CARDIZEM CD) 120 MG 24 hr capsule Take 1 capsule (120 mg total) by mouth daily. 90 capsule 0   dorzolamide-timolol (COSOPT) 22.3-6.8 MG/ML ophthalmic solution Place 1 drop into both eyes 2 (two) times daily.     empagliflozin (JARDIANCE) 25 MG TABS tablet Take 1 tablet (25 mg total) by mouth daily. 90 tablet 1   furosemide (LASIX) 20 MG tablet Take 1 tablet (20 mg total) by mouth daily. 90 tablet 1   glucose blood test strip CHECK FASTING GLUCOSE ONCE EACH MORNING, AND CHECK GLUCOSE AS NEEDED.     hydrALAZINE (APRESOLINE) 10 MG tablet Take 1 tablet (10 mg total) by mouth 4  (four) times daily as needed. If BP above 155/80 120 tablet 0   ketoconazole (NIZORAL) 2 % cream APPLY 1 APPLICATION TOPICALLY DAILY 60 g 0   levothyroxine (SYNTHROID) 50 MCG tablet Take 1 tablet (50 mcg total) by mouth daily before breakfast. And take two tablets twice a week 114 tablet 1   loratadine (CLARITIN) 10 MG tablet Take 1 tablet (10 mg total) by mouth daily. 90 tablet 1   metFORMIN (GLUCOPHAGE) 850 MG tablet Take 1 tablet (850 mg total) by mouth 2 (two) times daily. 180 tablet 1   Misc Natural Products (NEURIVA PO) Take  1 tablet by mouth daily.     olmesartan (BENICAR) 40 MG tablet Take 1 tablet (40 mg total) by mouth daily. 90 tablet 1   potassium chloride (KLOR-CON M) 10 MEQ tablet Take 1 tablet (10 mEq total) by mouth daily. 90 tablet 1   pravastatin (PRAVACHOL) 40 MG tablet Take 1 tablet (40 mg total) by mouth daily. 90 tablet 1   tacrolimus (PROTOPIC) 0.1 % ointment APPLY TWICE A DAY TO RASH UNDER BREAST AND IN GROIN FOR 2 WEEKS, THEN DECREASE TO EVERY DAY UNTIL CLEAR, THEN PRN FLARES 100 g 2   Travoprost, BAK Free, (TRAVATAN) 0.004 % SOLN ophthalmic solution Place 1 drop into both eyes at bedtime.     TRUE METRIX BLOOD GLUCOSE TEST test strip TEST BLOOD SUGAR IN THE MORNING, AT NOON, AND AT BEDTIME 100 strip 0   TRUEplus Lancets 33G MISC USE AS DIRECTED 300 each 0   TRULICITY 3 MG/0.5ML SOPN once a week.     Vitamin D, Ergocalciferol, (DRISDOL) 1.25 MG (50000 UNIT) CAPS capsule Take 1 capsule (50,000 Units total) by mouth every 7 (seven) days. 12 capsule 1   No current facility-administered medications for this visit.   Facility-Administered Medications Ordered in Other Visits  Medication Dose Route Frequency Provider Last Rate Last Admin   cyanocobalamin (VITAMIN B12) injection 1,000 mcg  1,000 mcg Intramuscular Q30 days Creig Hines, MD   1,000 mcg at 10/14/22 1540   cyanocobalamin (VITAMIN B12) injection 1,000 mcg  1,000 mcg Intramuscular Once Creig Hines, MD        cyanocobalamin (VITAMIN B12) injection 1,000 mcg  1,000 mcg Intramuscular Q30 days Creig Hines, MD   1,000 mcg at 05/19/23 1131    Allergies:   Augmentin [amoxicillin-pot clavulanate]    Social History:  The patient  reports that she has never smoked. She has never used smokeless tobacco. She reports that she does not drink alcohol and does not use drugs.   Family History:  The patient's family history includes Dementia in her mother; Diabetes in her mother; Hypertension in her mother and son.    ROS:  Please see the history of present illness.   Otherwise, review of systems are positive for none.   All other systems are reviewed and negative.    PHYSICAL EXAM: VS:  BP (!) 148/90 (BP Location: Left Arm, Patient Position: Sitting, Cuff Size: Large)   Pulse 65   Ht 5\' 4"  (1.626 m)   Wt 192 lb (87.1 kg)   LMP  (LMP Unknown)   SpO2 98%   BMI 32.96 kg/m  , BMI Body mass index is 32.96 kg/m. GEN: Well nourished, well developed, in no acute distress  HEENT: normal  Neck: no JVD, carotid bruits, or masses Cardiac: RRR; no  rubs, or gallops.  Mild bilateral leg edema worse on the left side. 2/6 systolic murmur in the aortic area Respiratory:  clear to auscultation bilaterally, normal work of breathing GI: soft, nontender, nondistended, + BS MS: no deformity or atrophy  Skin: warm and dry, no rash Neuro:  Strength and sensation are intact Psych: euthymic mood, full affect   EKG:  EKG is ordered today. The ekg ordered today demonstrates : Normal sinus rhythm with sinus arrhythmia Normal ECG     Recent Labs: 09/08/2022: Magnesium 1.4 01/17/2023: TSH 3.20 05/19/2023: ALT 9; BUN 11; Creatinine, Ser 0.98; Hemoglobin 12.0; Platelets 265; Potassium 3.7; Sodium 138    Lipid Panel    Component Value Date/Time  CHOL 142 01/17/2023 1138   CHOL 146 12/19/2016 0951   TRIG 110 01/17/2023 1138   HDL 63 01/17/2023 1138   HDL 50 12/19/2016 0951   CHOLHDL 2.3 01/17/2023 1138   LDLCALC  59 01/17/2023 1138      Wt Readings from Last 3 Encounters:  06/19/23 192 lb (87.1 kg)  05/19/23 191 lb 3.2 oz (86.7 kg)  05/16/23 191 lb 1.6 oz (86.7 kg)          No data to display            ASSESSMENT AND PLAN:  1.  Paroxysmal atrial fibrillation: She is maintaining in sinus rhythm on carvedilol and diltiazem.  Continue anticoagulation with Eliquis 5 mg twice daily.  Most recent labs in July showed normal renal function and hemoglobin.  2. Essential hypertension: Her blood pressure continues to be elevated with associated headache.  She did not tolerate hydralazine.  I elected to add spironolactone 25 mg once daily and discontinue potassium supplement.  Check basic metabolic profile in 1 week.  3. Hyperlipidemia: Most send lipid profile showed an LDL of 59 on pravastatin 40 mg once daily.  4.  Chronic diastolic heart failure with moderate to severe pulmonary hypertension: Continue furosemide 20 mg once daily.  She appears to be euvolemic but I elected to add spironolactone 25 mg once daily for better blood pressure control.   Disposition:   FU in 3 months to recheck blood pressure.   Signed, Susan Bears, MD 06/19/23

## 2023-06-19 NOTE — Patient Instructions (Addendum)
Medication Instructions:  STOP the Potassium  START Spironolactone 25 mg once daily  *If you need a refill on your cardiac medications before your next appointment, please call your pharmacy*   Lab Work: Your provider would like for you to return in one week to have the following labs drawn: BMET.   Please go to [facility] [address] ([facility] [address], [place] [address] You do not need an appointment.  They are open from 7:30 am-4 pm.  Lunch from 1:00 pm- 2:00 pm You will not need to be fasting.   You may also go to any of these [facility] locations:  [employer]  - [address] [employer] [address] Chief Technology Officer)   If you have labs (blood work) drawn today and your tests are completely normal, you will receive your results only by: [employer] (if you have MyChart) OR A paper copy in the mail If you have any lab test that is abnormal or we need to change your treatment, we will call you to review the results.   Testing/Procedures: None ordered   Follow-Up: At [facility], you and your health needs are our priority.  As part of our continuing mission to provide you with exceptional heart care, we have created designated Provider Care Teams.  These Care Teams include your primary Cardiologist (physician) and Advanced Practice Providers (APPs -  Physician Assistants and Nurse Practitioners) who all work together to provide you with the care you need, when you need it.  We recommend signing up for the patient portal called "MyChart".  Sign up information is provided on this After Visit Summary.  MyChart is used to connect with patients for Virtual Visits (Telemedicine).  Patients are able to view lab/test results, encounter notes, upcoming appointments, etc.  Non-urgent messages can be sent to your provider as well.   To learn more about what you can do with MyChart, go to [email or web address].    Your next appointment:   3  month(s)  Provider:   You will see one of the following Advanced Practice Providers on your designated Care Team:   Nicolasa Ducking, NP Eula Listen, PA-C Cadence Fransico Michael, PA-C Charlsie Quest, NP

## 2023-06-26 ENCOUNTER — Encounter: Payer: Self-pay | Admitting: Internal Medicine

## 2023-06-26 ENCOUNTER — Ambulatory Visit: Payer: Self-pay

## 2023-06-26 ENCOUNTER — Ambulatory Visit (INDEPENDENT_AMBULATORY_CARE_PROVIDER_SITE_OTHER): Payer: Medicare HMO | Admitting: Internal Medicine

## 2023-06-26 VITALS — BP 150/62 | HR 86 | Temp 98.8°F | Ht 64.0 in | Wt 189.1 lb

## 2023-06-26 DIAGNOSIS — U071 COVID-19: Secondary | ICD-10-CM | POA: Diagnosis not present

## 2023-06-26 DIAGNOSIS — H7292 Unspecified perforation of tympanic membrane, left ear: Secondary | ICD-10-CM

## 2023-06-26 MED ORDER — FLUTICASONE PROPIONATE 50 MCG/ACT NA SUSP: 2.0000 | Freq: Every day | NASAL | 6 refills | Status: DC

## 2023-06-26 MED ORDER — BENZONATATE 100 MG PO CAPS: 100.0000 mg | ORAL_CAPSULE | Freq: Two times a day (BID) | ORAL | 0 refills | Status: DC | PRN

## 2023-06-26 NOTE — Progress Notes (Signed)
Acute Office Visit  Subjective:     Patient ID: Susan Bowen, female    DOB: 09-01-1932, 89 y.o.   MRN: [id number]  Chief Complaint  Patient presents with   Covid Positive    Tested positive at 12:00 at home test, headache, sore throat, mucus, loss of appetite, symptoms began on 06/24/2023    HPI Patient is in today for positive for COVID. Symptoms started 2 days ago. Tested positive yesterday at home.   URI Compliant:  -Fever: no -Cough: yes, productive  -Shortness of breath: no -Wheezing: no -Chest congestion: no -Nasal congestion: yes -Runny nose: yes -Post nasal drip: yes -Sneezing: yes -Sore throat: yes -Sinus pressure: yes -Headache: yes -Face pain: no -Ear pain: no -Ear pressure: yes left - felt a pop a few days ago, no pain but pressure like. No drainage, hearing changes or dizziness -Vomiting: no -Context: stable -Treatments attempted: cough drops     Review of Systems  Constitutional:  Negative for chills and fever.  HENT:  Positive for congestion, sinus pain and sore throat. Negative for ear discharge, ear pain and hearing loss.   Respiratory:  Positive for cough. Negative for shortness of breath and wheezing.   Cardiovascular:  Negative for chest pain.  Gastrointestinal:  Negative for vomiting.  Neurological:  Positive for headaches. Negative for dizziness.        Objective:    BP (!) 150/62   Pulse 86   Temp 98.8 F (37.1 C)   Ht 5\' 4"  (1.626 m)   Wt 189 lb 1.6 oz (85.8 kg)   LMP  (LMP Unknown)   SpO2 95%   BMI 32.46 kg/m  BP Readings from Last 3 Encounters:  06/26/23 (!) 150/62  06/19/23 (!) 148/90  05/19/23 (!) 142/82   Wt Readings from Last 3 Encounters:  06/26/23 189 lb 1.6 oz (85.8 kg)  06/19/23 192 lb (87.1 kg)  05/19/23 191 lb 3.2 oz (86.7 kg)      Physical Exam Constitutional:      Appearance: Normal appearance.  HENT:     Head: Normocephalic and atraumatic.     Right Ear: Tympanic membrane and ear canal normal.      Left Ear: Ear canal and external ear normal.     Ears:     Comments: Left ear perforation, no evidence of infection    Nose: Nose normal.     Mouth/Throat:     Mouth: Mucous membranes are moist.     Pharynx: Oropharynx is clear.  Eyes:     Conjunctiva/sclera: Conjunctivae normal.  Cardiovascular:     Rate and Rhythm: Normal rate and regular rhythm.  Pulmonary:     Effort: Pulmonary effort is normal.     Breath sounds: Normal breath sounds. No wheezing, rhonchi or rales.  Skin:    General: Skin is warm and dry.  Neurological:     General: No focal deficit present.     Mental Status: She is alert. Mental status is at baseline.  Psychiatric:        Mood and Affect: Mood normal.        Behavior: Behavior normal.     No results found for any visits on 06/26/23.      Assessment & Plan:   1. COVID-19/Ear drum perforation, left: Patient is not a candiate for ant-viral therapy as she is on Eliquis. Symptoms overall mild, having some cough, sinus pressure. Left ear perforation, no infection, most likely from pressure in sinuses. Will treat  conservatively with cough suppressants and nasal steroids. Patient counseled to avoid putting anything in the ears, no drops or water. Patient has follow up scheduled on 9/18, will recheck ear at that time. Vitals stable, patient will call if COVID symptoms worsen or fail to improve.   Return for already scheduled.  Margarita Mail, DO

## 2023-06-26 NOTE — Patient Instructions (Addendum)
It was great seeing you today!  Plan discussed at today's visit: -Recommend tessalon perles for cough and Flonase 2 sprays on each side of nose twice a day to help with sinus pain and pressure and ear pressure -More info about ear perforation below  Follow up in: 9/18 already scheduled  Take care and let us know if you have any questions or concerns prior to your next visit.  Dr. Caralee Ates  Eardrum Rupture  An eardrum rupture is a hole (perforation) in the eardrum. The eardrum is a thin, round tissue inside the ear. It allows you to hear. This condition may cause pain and hearing loss. There is often little or no long-term hearing loss. What are the causes? This condition may be caused by: An infection. An injury from: Putting a thin object into the ear. Getting hit on the side of the head. Falling onto water or a flat surface. Changes in pressure that can happen from flying, scuba diving, or a very loud noise. Inserting a cotton swab in the ear. Long-term ear problems. Surgery on the ear. Getting a tube called a PE tube removed or having it fall out. This is a tube placed during a surgery to help with ear problems. What increases the risk? Having had PE tubes put in your ears. Having an ear infection. Playing sports that: Involve balls or contact with other players. Take place in water, such as diving, scuba diving, or waterskiing. What are the signs or symptoms? Pain. Ringing in the ear. Fluid leaking from the ear. Hearing loss. Dizziness. How is this treated? The eardrum often heals on its own in a few weeks. If your eardrum does not heal, your doctor may recommend surgery to fix the eardrum. You may also need antibiotic medicine to help prevent infection. Follow these instructions at home: Medicines Take over-the-counter and prescription medicines only as told by your doctor. If you were prescribed an antibiotic medicine, use it as told by your doctor. Do not stop using  it even if you start to feel better. Caring for your ear Keep your ear dry. Follow instructions from your doctor about how to keep your ear dry. You may need to wear waterproof earplugs when bathing and swimming. If told, put heat on the affected ear to help with pain. Do this as often as told by your doctor. Use the heat source that your doctor recommends, such as a moist heat pack or a heating pad. Place a towel between your skin and the heat source. Leave the heat on for 20-30 minutes. Take off the heat if your skin turns bright red. This is very important. If you cannot feel pain, heat, or cold, you have a greater risk of getting burned. General instructions Return to your normal activities when your doctor says that it is safe. When you play sports in which ear injuries may happen, wear headgear with ear protection. Talk to your doctor before you fly on an airplane. Keep all follow-up visits. Contact a doctor if: You have a fever. You have ear pain. You have mucus or blood leaking from your ear. You cannot hear. You have ringing in the ear. You feel dizzy. Get help right away if: You have sudden hearing loss. You are very dizzy. You get very bad pain in your ear. You have weakness in your face. You cannot move parts of your face. These symptoms may be an emergency. Do not wait to see if the symptoms will go away. Get help  right away. Call your local emergency services (911 in the [place] Summary An eardrum rupture is a tear that makes a hole in the eardrum. The eardrum will likely heal on its own within a few weeks. Follow instructions from your doctor about how to keep your ear dry and protected as it heals. This information is not intended to replace advice given to you by your health care provider. Make sure you discuss any questions you have with your health care provider. Document Revised: 09/11/2020 Document Reviewed: 09/11/2020 Elsevier Patient Education  2024 [employer].

## 2023-06-26 NOTE — Telephone Encounter (Signed)
  Chief Complaint: COVID + Symptoms: Cough, sore throat, HA, sneezing, Cough, no appetite.  Frequency: Tuesday Pertinent Negatives: Patient denies fever Disposition: [] ED /[] Urgent Care (no appt availability in office) / [x] Appointment(In office/virtual)/ []  [facility]/ [] Home Care/ [] Refused Recommended Disposition /[] [facility]/ []  Follow-up with PCP Additional Notes: Pt states that she may have had exposure at a recent funeral. S/s started Tuesday, tested + for COVID yesterday afternoon.     Summary: Covid positive   Pt is covid positive and has symptoms, she is seeking a Rx to relieve her. Headaches, coughing, sore throat, congestion.  Best contact: [phone]  [facility] [phone] [address], [place] - [address] [place] [address] Phone: [phone] Fax: [phone]     Reason for Disposition  [1] HIGH RISK patient (e.g., weak immune system, age > 64 years, obesity with BMI 30 or higher, pregnant, chronic lung disease or other chronic medical condition) AND [2] COVID symptoms (e.g., cough, fever)  (Exceptions: Already seen by PCP and no new or worsening symptoms.)  Answer Assessment - Initial Assessment Questions 1. COVID-19 DIAGNOSIS: "How do you know that you have COVID?" (e.g., positive lab test or self-test, diagnosed by doctor or NP/PA, symptoms after exposure).     yesterday 2. COVID-19 EXPOSURE: "Was there any known exposure to COVID before the symptoms began?" CDC Definition of close contact: within 6 feet (2 meters) for a total of 15 minutes or more over a 24-hour period.      unsure 3. ONSET: "When did the COVID-19 symptoms start?"      Tuesday 4. WORST SYMPTOM: "What is your worst symptom?" (e.g., cough, fever, shortness of breath, muscle aches)     Cough, sore throat no appetite 5. COUGH: "Do you have a cough?" If Yes, ask: "How bad is the cough?"       yes 6. FEVER: "Do you have a fever?" If Yes, ask: "What is your  temperature, how was it measured, and when did it start?"     no 7. RESPIRATORY STATUS: "Describe your breathing?" (e.g., normal; shortness of breath, wheezing, unable to speak)      No issues 8. BETTER-SAME-WORSE: "Are you getting better, staying the same or getting worse compared to yesterday?"  If getting worse, ask, "In what way?"     About the same 9. OTHER SYMPTOMS: "Do you have any other symptoms?"  (e.g., chills, fatigue, headache, loss of smell or taste, muscle pain, sore throat)     Cough, Sneezing, HA, Sneezing, cough, no appetite. 10. HIGH RISK DISEASE: "Do you have any chronic medical problems?" (e.g., asthma, heart or lung disease, weak immune system, obesity, etc.)       heart  Protocols used: Coronavirus (COVID-19) Diagnosed or Suspected-A-AH

## 2023-07-01 ENCOUNTER — Other Ambulatory Visit: Payer: Self-pay | Admitting: Cardiovascular Disease

## 2023-07-08 DIAGNOSIS — I10 Essential (primary) hypertension: Secondary | ICD-10-CM | POA: Diagnosis not present

## 2023-07-09 LAB — BASIC METABOLIC PANEL
BUN/Creatinine Ratio: 9 — ABNORMAL LOW (ref 12–28)
BUN: 9 mg/dL (ref 8–27)
CO2: 22 mmol/L (ref 20–29)
Calcium: 10 mg/dL (ref 8.7–10.3)
Chloride: 107 mmol/L — ABNORMAL HIGH (ref 96–106)
Creatinine, Ser: 0.96 mg/dL (ref 0.57–1.00)
Glucose: 151 mg/dL — ABNORMAL HIGH (ref 70–99)
Potassium: 4.4 mmol/L (ref 3.5–5.2)
Sodium: 144 mmol/L (ref 134–144)
eGFR: 57 mL/min/{1.73_m2} — ABNORMAL LOW (ref >59)

## 2023-07-11 ENCOUNTER — Encounter: Payer: Self-pay | Admitting: *Deleted

## 2023-07-16 ENCOUNTER — Other Ambulatory Visit: Payer: Self-pay | Admitting: Family Medicine

## 2023-07-16 DIAGNOSIS — E890 Postprocedural hypothyroidism: Secondary | ICD-10-CM

## 2023-07-16 DIAGNOSIS — M545 Low back pain, unspecified: Secondary | ICD-10-CM

## 2023-07-17 ENCOUNTER — Telehealth: Payer: Self-pay | Admitting: Family Medicine

## 2023-07-17 ENCOUNTER — Other Ambulatory Visit: Payer: Self-pay

## 2023-07-17 DIAGNOSIS — M545 Low back pain, unspecified: Secondary | ICD-10-CM

## 2023-07-17 NOTE — Telephone Encounter (Signed)
Informed patient Susan Bowen and her Tylenol have been sent in today. Patient verbalized understanding.

## 2023-07-17 NOTE — Telephone Encounter (Signed)
Copied from CRM [id number]. Topic: General - Other >> Jul 17, 2023 12:46 PM Susan Bowen wrote: Reason for CRM: Pt called to report that she is completely out of her current supply of her medications.

## 2023-07-21 ENCOUNTER — Inpatient Hospital Stay: Payer: Medicare HMO | Attending: Oncology

## 2023-07-21 ENCOUNTER — Other Ambulatory Visit: Payer: Self-pay | Admitting: Family Medicine

## 2023-07-21 DIAGNOSIS — E538 Deficiency of other specified B group vitamins: Secondary | ICD-10-CM | POA: Diagnosis not present

## 2023-07-21 DIAGNOSIS — M545 Low back pain, unspecified: Secondary | ICD-10-CM

## 2023-07-21 DIAGNOSIS — D508 Other iron deficiency anemias: Secondary | ICD-10-CM

## 2023-07-21 DIAGNOSIS — Z79899 Other long term (current) drug therapy: Secondary | ICD-10-CM | POA: Insufficient documentation

## 2023-07-21 MED ORDER — ACETAMINOPHEN-CODEINE 300-30 MG PO TABS
1.0000 | ORAL_TABLET | Freq: Four times a day (QID) | ORAL | 0 refills | Status: AC | PRN
Start: 2023-07-21 — End: 2023-08-20

## 2023-07-21 MED ORDER — CYANOCOBALAMIN 1000 MCG/ML IJ SOLN
1000.0000 ug | INTRAMUSCULAR | Status: DC
  Administered 2023-07-21: 1000 ug via INTRAMUSCULAR
  Filled 2023-07-21: qty 1

## 2023-07-22 NOTE — Progress Notes (Unsigned)
Name: Susan Bowen   MRN: [id number]    DOB: [birth year]   Date:07/23/2023       Progress Note  Subjective  Chief Complaint  Follow Up  HPI  Thyroid mass: US done in 10/2020 showed, history of thyroid ablation, taking levothyroxine.  Last TSH was normal - last level checked 01/2023. She has noticed some dysphagia with larger pills, but is sporadic and mild. She takes multiple pills in the morning and at night. She states she started to take larger pills by itself and has helped .   IMPRESSION: 1. Atrophic and moderately heterogeneous thyroid without worrisome new or enlarging thyroid nodules 2. Dominant right-sided thyroid nodule has decreased in size compared to the 2015 examination. Imaging stability for greater than 5 years is indicative of benign etiology. 3. None of the other discretely measured nodules/pseudo nodules meet imaging criteria to recommend percutaneous sampling or continued dedicated follow-up.  DMII with microalbuminuria and CKI stage III :  she is taking medications Trulicity, Metformin and Jardiance, ARB and statin therapy. She has not been checking her glucose lately   She denies polyphagia, polydipsia or  polyuria. Her hgbA1C today is  6.3 %  .   CKI stage III: GFR in the 50's range and stable over the past few times.  She has good urine output and no pruritis Urine micro used to be elevated but last level was normal.  She is on SGl-2 agonist and ARB   HTN: she has been taking medication. No chest pain, but occasionally has heart flutter. She is takes Cardizem, Carvedilol and Benicar, furosemide and recently started on Spironolactone and bp today is at goal. She still has some hydralazine at home to take prn only    Afib/CHF diastolic /pulmonary HTN  : under the care of Dr. Kirke Corin, rate controlled, she has sob with activity but very seldom, she has mild orthopnea ( uses two pillows) , she states palpitation has not been present lately, she denies chest painShe has  some lower extremity edema that has been stable. She still takes lasix daily  She also has pulmonary hypertension and last Echo. She is on Eliquis , has senile purpura that is stable. EF 60-65 % on Echo done 05/2022 . Recently started on spironolactone    Hyperlipidemia: taking Pravastatin and denies side effects of medications, no myalgia. REviewed last labs   Right costochondral pain: going on for over one year, very tender to touch, history of carcinoid tumor with metastasis, she forgot to tell oncologist. Explained I can check x-ray but needs to discuss it with Dr. Smith Robert also   Aorta atherosclerosis on CT chest: on statin and Eliquis.  Unchanged    Carcinoid tumor with metastasis: incidental finding on CT abdomen done for evaluation of renal stone back in 03/26/2019, it showed retroperitoneal lymphadenopathy, she has since seen Dr. 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 She is not on therapy just watchful waiting.  She has regular follow ups, she still has episodes of diarrhea is down to 4 times a month that last about 6-8  hours per episode ,  denies  blood and mucus  Appetite is good , weight is trending upt   Anemia secondary to iron  and B12 deficiency: under the care of Dr. Smith Robert , getting B12 injections monthly , last level was in the 300's   Intermittent low back pain?neck pain : cannot take NSAID's takes tylenol with codeine prn for pain  and needs a refill. 30 pills lasting a couple of months. States triggered by activity,  she has noticed some numbness on left foot on and off for the past few weeks and it is worse at night She also has DDD of neck and over the past few days has noticed increase in neck pain , 7/10 , no radiculitis but affecting her sleep.     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) 09/18/2021   Iron deficiency anemia 12/04/2020   Carcinoid tumor, malignant (HCC) 09/08/2019   Pain in joint of left shoulder 02/25/2019   Pulmonary hypertension, unspecified (HCC) 12/28/2018   Paroxysmal A-fib (HCC) 12/28/2018   Chronic kidney disease, stage III (moderate) (HCC) 04/01/2018   Elevated parathyroid hormone 01/01/2018   Elevated uric acid in blood 04/29/2017   Plantar fasciitis of left foot 03/12/2016   Postprocedural hypothyroidism 01/23/2016   Aortic stenosis 09/26/2015   Allergic rhinitis, seasonal 08/08/2015   Benign hypertension 08/08/2015   History of pneumonia 08/08/2015   Carpal tunnel syndrome 08/08/2015   Cataract 08/08/2015   Insomnia, persistent 08/08/2015   Dyslipidemia 08/08/2015   History of depression 08/08/2015   Glaucoma 08/08/2015   Controlled gout 08/08/2015   Fever blister 08/08/2015   Bilateral hearing loss 08/08/2015   Hemorrhoid 08/08/2015   H/O iron deficiency anemia 08/08/2015   Benign neoplasm of colon 08/08/2015   Impingement syndrome of shoulder 08/08/2015   Osteoporosis, post-menopausal 08/08/2015   Generalized OA 08/08/2015   Multinodular goiter 08/08/2015   Asymptomatic varicose veins 08/08/2015   Polyp of vocal cord 08/08/2015   History of vertebral compression fracture 08/08/2015   Diabetes mellitus with proteinuric diabetic nephropathy (HCC) 08/08/2015   LVH (left ventricular hypertrophy) 08/08/2015   Moderate tricuspid regurgitation 08/08/2015   History of radioactive iodine thyroid ablation 08/08/2015   Lung nodule, solitary 05/12/2014   Chronic cough 05/11/2014   Vitamin D deficiency 12/20/2009   Plantar fascial fibromatosis 12/20/2009   Personal history of fall 07/20/2008   Cervical radiculitis 05/11/2008    Past Surgical History:  Procedure Laterality Date   ABDOMINAL HYSTERECTOMY  1986   APPENDECTOMY     BREAST SURGERY Bilateral 1983    biopsy of each side, negative   CARPAL TUNNEL RELEASE     CATARACT EXTRACTION W/PHACO Left 09/03/2017   Procedure: CATARACT EXTRACTION PHACO AND INTRAOCULAR LENS PLACEMENT (IOC)-LEFT DIABETIC;  Surgeon: Galen Manila, MD;  Location: [facility];  Service: Ophthalmology;  Laterality: Left;  [place] 01:10.2 AP% 16.7 CDE 11.71 Fluid Pack Lot # [id number]   CATARACT EXTRACTION W/PHACO Right 09/30/2017   Procedure: CATARACT EXTRACTION PHACO AND INTRAOCULAR LENS PLACEMENT (IOC);  Surgeon: Galen Manila, MD;  Location: [facility];  Service: Ophthalmology;  Laterality: Right;  [place] 01:35.1 AP% 13.1 CDE 12.44 Fluid pack Lot # [id number]   COLON SURGERY     EYE SURGERY Left 09/03/2017   Cataract extraction   FEMUR FRACTURE SURGERY Left    FRACTURE SURGERY  2001   left femur   HEMORROIDECTOMY     MULTIPLE TOOTH EXTRACTIONS     polyp removed from vocal cord      Family History  Problem Relation Age of Onset   Diabetes Mother    Hypertension Mother    Dementia Mother    Hypertension Son  Social History   Tobacco Use   Smoking status: Never   Smokeless tobacco: Never   Tobacco comments:    Smoking cessation materials not required  Substance Use Topics   Alcohol use: No    Alcohol/week: 0.0 standard drinks of alcohol     Current Outpatient Medications:    acetaminophen-codeine (TYLENOL #3) 300-30 MG tablet, Take 1 tablet by mouth every 6 (six) hours as needed., Disp: 30 tablet, Rfl: 0   Alcohol Swabs (DROPSAFE ALCOHOL PREP) 70 % PADS, USE TWICE DAILY, Disp: 200 each, Rfl: 3   apixaban (ELIQUIS) 5 MG TABS tablet, Take 1 tablet (5 mg total) by mouth 2 (two) times daily., Disp: 180 tablet, Rfl: 1   Blood Glucose Monitoring Suppl (ACCU-CHEK GUIDE) w/Device KIT, PLEASE SEE ATTACHED FOR DETAILED DIRECTIONS, Disp: , Rfl:    Blood Glucose Monitoring Suppl DEVI, 1 each by Does not apply route in the morning, at noon, and at bedtime. May substitute to any manufacturer covered by patient's  insurance., Disp: 1 each, Rfl: 0   carvedilol (COREG) 25 MG tablet, Take 1 tablet (25 mg total) by mouth 2 (two) times daily. New dose, Disp: 180 tablet, Rfl: 1   diltiazem (CARDIZEM CD) 120 MG 24 hr capsule, TAKE 1 CAPSULE BY MOUTH EVERY DAY, Disp: 90 capsule, Rfl: 0   dorzolamide-timolol (COSOPT) 22.3-6.8 MG/ML ophthalmic solution, Place 1 drop into both eyes 2 (two) times daily., Disp: , Rfl:    empagliflozin (JARDIANCE) 25 MG TABS tablet, Take 1 tablet (25 mg total) by mouth daily., Disp: 90 tablet, Rfl: 1   fluticasone (FLONASE) 50 MCG/ACT nasal spray, Place 2 sprays into both nostrils daily., Disp: 16 g, Rfl: 6   furosemide (LASIX) 20 MG tablet, Take 1 tablet (20 mg total) by mouth daily., Disp: 90 tablet, Rfl: 1   glucose blood test strip, CHECK FASTING GLUCOSE ONCE EACH MORNING, AND CHECK GLUCOSE AS NEEDED., Disp: , Rfl:    hydrALAZINE (APRESOLINE) 10 MG tablet, Take 1 tablet (10 mg total) by mouth 4 (four) times daily as needed. If BP above 155/80, Disp: 120 tablet, Rfl: 0   ketoconazole (NIZORAL) 2 % cream, APPLY 1 APPLICATION TOPICALLY DAILY, Disp: 60 g, Rfl: 0   levothyroxine (SYNTHROID) 50 MCG tablet, TAKE 1 TABLET (50 MCG TOTAL) BY MOUTH DAILY BEFORE BREAKFAST. AND TAKE TWO TABLETS TWICE A WEEK, Disp: 114 tablet, Rfl: 1   loratadine (CLARITIN) 10 MG tablet, Take 1 tablet (10 mg total) by mouth daily., Disp: 90 tablet, Rfl: 1   metFORMIN (GLUCOPHAGE) 850 MG tablet, Take 1 tablet (850 mg total) by mouth 2 (two) times daily., Disp: 180 tablet, Rfl: 1   Misc Natural Products (NEURIVA PO), Take 1 tablet by mouth daily., Disp: , Rfl:    olmesartan (BENICAR) 40 MG tablet, Take 1 tablet (40 mg total) by mouth daily., Disp: 90 tablet, Rfl: 1   pravastatin (PRAVACHOL) 40 MG tablet, Take 1 tablet (40 mg total) by mouth daily., Disp: 90 tablet, Rfl: 1   spironolactone (ALDACTONE) 25 MG tablet, Take 1 tablet (25 mg total) by mouth daily., Disp: 90 tablet, Rfl: 3   tacrolimus (PROTOPIC) 0.1 %  ointment, APPLY TWICE A DAY TO RASH UNDER BREAST AND IN GROIN FOR 2 WEEKS, THEN DECREASE TO EVERY DAY UNTIL CLEAR, THEN PRN FLARES, Disp: 100 g, Rfl: 2   Travoprost, BAK Free, (TRAVATAN) 0.004 % SOLN ophthalmic solution, Place 1 drop into both eyes at bedtime., Disp: , Rfl:    TRUE METRIX BLOOD GLUCOSE  TEST test strip, TEST BLOOD SUGAR IN THE MORNING, AT NOON, AND AT BEDTIME, Disp: 100 strip, Rfl: 0   TRUEplus Lancets 33G MISC, USE AS DIRECTED, Disp: 300 each, Rfl: 3   TRULICITY 3 MG/0.5ML SOPN, once a week., Disp: , Rfl:    Vitamin D, Ergocalciferol, (DRISDOL) 1.25 MG (50000 UNIT) CAPS capsule, Take 1 capsule (50,000 Units total) by mouth every 7 (seven) days., Disp: 12 capsule, Rfl: 1   acetaminophen (TYLENOL) 500 MG tablet, Take 500 mg by mouth every 6 (six) hours as needed. (Patient not taking: Reported on 06/26/2023), Disp: , Rfl:    Blood Glucose Calibration (TRUE METRIX LEVEL 1) Low SOLN, 1 each by In Vitro route once for 1 dose., Disp: 1 each, Rfl: 3 No current facility-administered medications for this visit.  Facility-Administered Medications Ordered in Other Visits:    cyanocobalamin (VITAMIN B12) injection 1,000 mcg, 1,000 mcg, Intramuscular, Q30 days, Creig Hines, MD, 1,000 mcg at 10/14/22 1540   cyanocobalamin (VITAMIN B12) injection 1,000 mcg, 1,000 mcg, Intramuscular, Once, Creig Hines, MD   cyanocobalamin (VITAMIN B12) injection 1,000 mcg, 1,000 mcg, Intramuscular, Q30 days, Creig Hines, MD, 1,000 mcg at 05/19/23 1131  Allergies  Allergen Reactions   Augmentin [Amoxicillin-Pot Clavulanate] Itching    Has patient had a PCN reaction causing immediate rash, facial/tongue/throat swelling, SOB or lightheadedness with hypotension: No Has patient had a PCN reaction causing severe rash involving mucus membranes or skin necrosis: No Has patient had a PCN reaction that required hospitalization: No Has patient had a PCN reaction occurring within the last 10 years: No If all of the  above answers are "NO", then may proceed with Cephalosporin use.     I personally reviewed active problem list, medication list, allergies, family history, social history with the patient/caregiver today.   ROS  Ten systems reviewed and is negative except as mentioned in HPI    Objective  Vitals:   07/23/23 1053  BP: 132/70  Pulse: 80  Resp: 16  Temp: 97.9 F (36.6 C)  TempSrc: Oral  SpO2: 96%  Weight: 191 lb 6.4 oz (86.8 kg)  Height: 5\' 4"  (1.626 m)    Body mass index is 32.85 kg/m.  Physical Exam  Constitutional: Patient appears well-developed and well-nourished. Obese  No distress.  HEENT: head atraumatic, normocephalic, pupils equal and reactive to light, ears normal TM, neck supple Cardiovascular: Normal rate, regular rhythm and normal heart sounds.  No murmur heard. No BLE edema. Pulmonary/Chest: Effort normal and breath sounds normal. No respiratory distress.very tender during palpation of right costochondral joint on right side  Abdominal: Soft.  There is no tenderness. Psychiatric: Patient has a normal mood and affect. behavior is normal. Judgment and thought content normal.    PHQ2/9:    07/23/2023   10:55 AM 06/26/2023    2:51 PM 04/29/2023    1:39 PM 01/17/2023   10:51 AM 12/05/2022    8:33 AM  Depression screen PHQ 2/9  Decreased Interest 0  0 0 0  Down, Depressed, Hopeless 0 0 0 0 0  PHQ - 2 Score 0 0 0 0 0  Altered sleeping 0  0 0   Tired, decreased energy 0  0 0   Change in appetite 0  0 0   Feeling bad or failure about yourself  0  0 0   Trouble concentrating 0  0 0   Moving slowly or fidgety/restless 0  0 0   Suicidal thoughts 0  0 0  PHQ-9 Score 0  0 0     phq 9 is negative   Fall Risk:    07/23/2023   10:55 AM 06/26/2023    2:51 PM 05/16/2023    2:55 PM 04/29/2023    1:39 PM 01/17/2023   10:33 AM  Fall Risk   Falls in the past year? 1 1 0 0 0  Number falls in past yr: 1 1  0   Injury with Fall? 1 0  0   Risk for fall due to :  History of fall(s)  No Fall Risks No Fall Risks No Fall Risks  Follow up Falls prevention discussed;Education provided;Falls evaluation completed  Falls prevention discussed Falls prevention discussed Falls prevention discussed;Education provided;Falls evaluation completed      Functional Status Survey: Is the patient deaf or have difficulty hearing?: Yes Does the patient have difficulty seeing, even when wearing glasses/contacts?: Yes Does the patient have difficulty concentrating, remembering, or making decisions?: Yes Does the patient have difficulty walking or climbing stairs?: Yes Does the patient have difficulty dressing or bathing?: No Does the patient have difficulty doing errands alone such as visiting a doctor's office or shopping?: Yes    Assessment & Plan  1. Dyslipidemia associated with type 2 diabetes mellitus (HCC)  - POCT HgB A1C - HM Diabetes Foot Exam - empagliflozin (JARDIANCE) 25 MG TABS tablet; Take 1 tablet (25 mg total) by mouth daily.  Dispense: 90 tablet; Refill: 1 - metFORMIN (GLUCOPHAGE) 850 MG tablet; Take 1 tablet (850 mg total) by mouth 2 (two) times daily.  Dispense: 180 tablet; Refill: 1  2. Need for immunization against influenza  - Flu Vaccine Trivalent High Dose (Fluad)  3. Small vessel disease (HCC)  On statin therapy and eliquis   4. Paroxysmal A-fib (HCC)  Rate controlled   5. Stage 3a chronic kidney disease (HCC)  Stable, on ARB and SGL-2 agonist   6. Metastasis to intestinal lymph node (HCC)  - DG Sternum; Future  7. Atherosclerosis of aorta (HCC)  - pravastatin (PRAVACHOL) 40 MG tablet; Take 1 tablet (40 mg total) by mouth daily.  Dispense: 90 tablet; Refill: 1  8. Pulmonary hypertension, unspecified (HCC)  Keep follow up with cardiologist   9. CHF (congestive heart failure), NYHA class I, chronic, diastolic (HCC)  - olmesartan (BENICAR) 40 MG tablet; Take 1 tablet (40 mg total) by mouth daily.  Dispense: 90 tablet;  Refill: 1  10. Vitamin D deficiency  - Vitamin D, Ergocalciferol, (DRISDOL) 1.25 MG (50000 UNIT) CAPS capsule; Take 1 capsule (50,000 Units total) by mouth every 7 (seven) days.  Dispense: 12 capsule; Refill: 1  11. DDD (degenerative disc disease), cervical  - pregabalin (LYRICA) 50 MG capsule; Take 1 capsule (50 mg total) by mouth every evening.  Dispense: 90 capsule; Refill: 0  12. Costochondral chest pain  - DG Sternum; Future

## 2023-07-23 ENCOUNTER — Ambulatory Visit
Admission: RE | Admit: 2023-07-23 | Discharge: 2023-07-23 | Disposition: A | Discharge status: Home / Self Care | Payer: Medicare HMO | Source: Ambulatory Visit | Attending: Family Medicine | Admitting: Family Medicine

## 2023-07-23 ENCOUNTER — Encounter: Payer: Self-pay | Admitting: Family Medicine

## 2023-07-23 ENCOUNTER — Ambulatory Visit (INDEPENDENT_AMBULATORY_CARE_PROVIDER_SITE_OTHER): Payer: Medicare HMO | Admitting: Family Medicine

## 2023-07-23 ENCOUNTER — Ambulatory Visit
Admission: RE | Admit: 2023-07-23 | Discharge: 2023-07-23 | Disposition: A | Discharge status: Home / Self Care | Payer: Medicare HMO | Attending: Family Medicine | Admitting: Family Medicine

## 2023-07-23 VITALS — BP 132/70 | HR 80 | Temp 97.9°F | Resp 16 | Ht 64.0 in | Wt 191.4 lb

## 2023-07-23 DIAGNOSIS — C772 Secondary and unspecified malignant neoplasm of intra-abdominal lymph nodes: Secondary | ICD-10-CM

## 2023-07-23 DIAGNOSIS — I739 Peripheral vascular disease, unspecified: Secondary | ICD-10-CM

## 2023-07-23 DIAGNOSIS — I480 Paroxysmal atrial fibrillation: Secondary | ICD-10-CM

## 2023-07-23 DIAGNOSIS — Z23 Encounter for immunization: Secondary | ICD-10-CM

## 2023-07-23 DIAGNOSIS — I272 Pulmonary hypertension, unspecified: Secondary | ICD-10-CM

## 2023-07-23 DIAGNOSIS — Z7984 Long term (current) use of oral hypoglycemic drugs: Secondary | ICD-10-CM

## 2023-07-23 DIAGNOSIS — R0789 Other chest pain: Secondary | ICD-10-CM

## 2023-07-23 DIAGNOSIS — E559 Vitamin D deficiency, unspecified: Secondary | ICD-10-CM

## 2023-07-23 DIAGNOSIS — N1831 Chronic kidney disease, stage 3a: Secondary | ICD-10-CM | POA: Diagnosis not present

## 2023-07-23 DIAGNOSIS — M5030 Other cervical disc degeneration, unspecified cervical region: Secondary | ICD-10-CM

## 2023-07-23 DIAGNOSIS — I5032 Chronic diastolic (congestive) heart failure: Secondary | ICD-10-CM

## 2023-07-23 DIAGNOSIS — E1169 Type 2 diabetes mellitus with other specified complication: Secondary | ICD-10-CM | POA: Diagnosis not present

## 2023-07-23 DIAGNOSIS — I700 Atherosclerosis of aorta: Secondary | ICD-10-CM | POA: Diagnosis not present

## 2023-07-23 DIAGNOSIS — R079 Chest pain, unspecified: Secondary | ICD-10-CM | POA: Diagnosis not present

## 2023-07-23 LAB — POCT GLYCOSYLATED HEMOGLOBIN (HGB A1C): Hemoglobin A1C: 6.3 % — AB (ref 4.0–5.6)

## 2023-07-23 MED ORDER — PRAVASTATIN SODIUM 40 MG PO TABS
40.0000 mg | ORAL_TABLET | Freq: Every day | ORAL | 1 refills | Status: DC
Start: 2023-07-23 — End: 2023-12-12

## 2023-07-23 MED ORDER — VITAMIN D (ERGOCALCIFEROL) 1.25 MG (50000 UNIT) PO CAPS: 50000.0000 [IU] | ORAL_CAPSULE | ORAL | 1 refills | Status: DC

## 2023-07-23 MED ORDER — METFORMIN HCL 850 MG PO TABS: 850.0000 mg | ORAL_TABLET | Freq: Two times a day (BID) | ORAL | 1 refills | Status: DC

## 2023-07-23 MED ORDER — OLMESARTAN MEDOXOMIL 40 MG PO TABS
40.0000 mg | ORAL_TABLET | Freq: Every day | ORAL | 1 refills | Status: DC
Start: 2023-07-23 — End: 2023-12-12

## 2023-07-23 MED ORDER — EMPAGLIFLOZIN 25 MG PO TABS: 25.0000 mg | ORAL_TABLET | Freq: Every day | ORAL | 1 refills | Status: DC

## 2023-07-23 MED ORDER — PREGABALIN 50 MG PO CAPS
50.0000 mg | ORAL_CAPSULE | Freq: Every evening | ORAL | 0 refills | Status: DC
Start: 2023-07-23 — End: 2024-01-20

## 2023-08-20 ENCOUNTER — Inpatient Hospital Stay: Payer: Medicare HMO | Attending: Oncology

## 2023-08-20 DIAGNOSIS — D508 Other iron deficiency anemias: Secondary | ICD-10-CM

## 2023-08-20 DIAGNOSIS — E538 Deficiency of other specified B group vitamins: Secondary | ICD-10-CM | POA: Insufficient documentation

## 2023-08-20 DIAGNOSIS — Z79899 Other long term (current) drug therapy: Secondary | ICD-10-CM | POA: Diagnosis not present

## 2023-08-20 MED ORDER — CYANOCOBALAMIN 1000 MCG/ML IJ SOLN
1000.0000 ug | INTRAMUSCULAR | Status: DC
  Administered 2023-08-20: 1000 ug via INTRAMUSCULAR
  Filled 2023-08-20: qty 1

## 2023-09-11 ENCOUNTER — Other Ambulatory Visit: Payer: Self-pay | Admitting: Cardiovascular Disease

## 2023-09-11 ENCOUNTER — Other Ambulatory Visit: Payer: Self-pay | Admitting: Family Medicine

## 2023-09-11 DIAGNOSIS — I10 Essential (primary) hypertension: Secondary | ICD-10-CM

## 2023-09-11 DIAGNOSIS — I480 Paroxysmal atrial fibrillation: Secondary | ICD-10-CM

## 2023-09-11 NOTE — Telephone Encounter (Signed)
Please review

## 2023-09-11 NOTE — Telephone Encounter (Signed)
Eliquis 5mg  refill request received. Patient is 89 years old, weight-86.8kg, Crea-0.96 on 07/08/23, Diagnosis-Afib, and last seen by Dr. Kirke Corin on 06/19/23. Dose is appropriate based on dosing criteria. Will send in refill to requested pharmacy.

## 2023-09-19 ENCOUNTER — Encounter: Payer: Self-pay | Admitting: Medical

## 2023-09-19 ENCOUNTER — Inpatient Hospital Stay: Payer: Medicare HMO

## 2023-09-19 ENCOUNTER — Ambulatory Visit: Payer: Medicare HMO | Attending: Medical | Admitting: Medical

## 2023-09-19 ENCOUNTER — Other Ambulatory Visit: Payer: Self-pay | Admitting: Family Medicine

## 2023-09-19 ENCOUNTER — Other Ambulatory Visit: Payer: Self-pay | Admitting: Cardiovascular Disease

## 2023-09-19 VITALS — BP 134/72 | HR 63 | Ht 64.0 in | Wt 187.8 lb

## 2023-09-19 DIAGNOSIS — I272 Pulmonary hypertension, unspecified: Secondary | ICD-10-CM

## 2023-09-19 DIAGNOSIS — I5032 Chronic diastolic (congestive) heart failure: Secondary | ICD-10-CM

## 2023-09-19 DIAGNOSIS — I10 Essential (primary) hypertension: Secondary | ICD-10-CM

## 2023-09-19 DIAGNOSIS — I480 Paroxysmal atrial fibrillation: Secondary | ICD-10-CM

## 2023-09-19 DIAGNOSIS — E782 Mixed hyperlipidemia: Secondary | ICD-10-CM | POA: Diagnosis not present

## 2023-09-19 NOTE — Progress Notes (Signed)
Cardiology Office Note:    Date:  09/19/2023   ID:  Susan Bowen, DOB 25-Mar-1932, MRN [id number]  PCP:  Alba Cory, MD  [facility] Cardiologist:  Lorine Bears, MD  [facility] Electrophysiologist:  None   Referring MD: Alba Cory, MD   Chief Complaint: 3 month follow-up  History of Present Illness:    Susan Bowen is a 89 y.o. female with a hx of paroxysmal Afib, chronic diastolic heart failure, pulmonary HTN, HTN, CKD stage 3, DM2, and HLD who presents for follow-up.  She was hospitalized in May 2019 with chest pain in the setting of Afib RVR. The dose of coreg was increased and she was started on Eliquis for anticoagulation. Echo showed normal LVSF, G1DD. Lexiscan Myoview in June 2019 showed no evidence of ischemia with normal EF.   In 2002, she was diagnosed with carcinoid tumor with metastasis. This was an incidental finding on CT of the abdomen which was done for renal stone. This is monitor by oncology.   Echo in August 2022 showed normal LVSF, G1DD, moderate to severe pulmonary HTN.   She had an ER visit in 09/2022 for Afib RVR. She improved with IV dilt. She was discharged home on diltiazem extended release 120mg  once daily.   She was last seen 06/2023 and reported exertional dyspnea. BP was still high.   Today, the patient reports she has been feeling well. She gets SOB when she walks, but this is unchanged. Occasionally she has heart fluttering. She denies chest pain, lower leg edema, lightheadedness or dizziness. She had COVID in July. She takes lasix most days. She denies bleeding issues with Eliquis. She lives alone but does have family close by.   Past Medical History:  Diagnosis Date   Allergy    Back spasm    Bunion    Carpal tunnel syndrome    Cataracta    Diabetes mellitus without complication (HCC)    Generalized osteoarthritis    GERD (gastroesophageal reflux disease)    Gout    Hemorrhoids without complication    Hyperlipidemia     Hypertension    Hypothyroidism    Impingement syndrome of right shoulder    Insomnia    Lipoma    Lung nodule    Metastatic carcinoid tumor (HCC) 03/2016   Neuritis or radiculitis due to rupture of lumbar intervertebral disc    Obesity    Osteoporosis    PAF (paroxysmal atrial fibrillation) (HCC)    a. 03/2018 AF w/ RVR-->converted spont.  CHA2DS2VASc = 7-->Eliquis.   Proteinuria    Rotator cuff tear    Systolic murmur    a. 03/2018 Echo: EF 60-65%, no rwma, Gr1 DD, mild MR, mildly dil LA. Nl RV fxn. PASP .   TIA (transient ischemic attack)    TIA (transient ischemic attack) 1998   small TIA.  no residual effects   Tinnitus of both ears    Unspecified glaucoma(365.9)    Vitamin D deficiency    Wedge compression fracture of t11-T12 vertebra, sequela     Past Surgical History:  Procedure Laterality Date   ABDOMINAL HYSTERECTOMY  1986   APPENDECTOMY     BREAST SURGERY Bilateral 1983   biopsy of each side, negative   CARPAL TUNNEL RELEASE     CATARACT EXTRACTION W/PHACO Left 09/03/2017   Procedure: CATARACT EXTRACTION PHACO AND INTRAOCULAR LENS PLACEMENT (IOC)-LEFT DIABETIC;  Surgeon: Galen Manila, MD;  Location: [facility] ORS;  Service: Ophthalmology;  Laterality: Left;  [place] 01:10.2  AP% 16.7 CDE 11.71 Fluid Pack Lot # [id number]   CATARACT EXTRACTION W/PHACO Right 09/30/2017   Procedure: CATARACT EXTRACTION PHACO AND INTRAOCULAR LENS PLACEMENT (IOC);  Surgeon: Galen Manila, MD;  Location: [facility] ORS;  Service: Ophthalmology;  Laterality: Right;  [place] 01:35.1 AP% 13.1 CDE 12.44 Fluid pack Lot # [id number]   COLON SURGERY     EYE SURGERY Left 09/03/2017   Cataract extraction   FEMUR FRACTURE SURGERY Left    FRACTURE SURGERY  2001   left femur   HEMORROIDECTOMY     MULTIPLE TOOTH EXTRACTIONS     polyp removed from vocal cord      Current Medications: Current Meds  Medication Sig   acetaminophen (TYLENOL) 500 MG tablet Take 500 mg by mouth every 6 (six) hours as  needed.   Alcohol Swabs (DROPSAFE ALCOHOL PREP) 70 % PADS USE TWICE DAILY   Blood Glucose Monitoring Suppl (ACCU-CHEK GUIDE) w/Device KIT PLEASE SEE ATTACHED FOR DETAILED DIRECTIONS   Blood Glucose Monitoring Suppl DEVI 1 each by Does not apply route in the morning, at noon, and at bedtime. May substitute to any manufacturer covered by patient's insurance.   carvedilol (COREG) 25 MG tablet TAKE 1 TABLET BY MOUTH TWICE A DAY   diltiazem (CARDIZEM CD) 120 MG 24 hr capsule TAKE 1 CAPSULE BY MOUTH EVERY DAY   dorzolamide-timolol (COSOPT) 22.3-6.8 MG/ML ophthalmic solution Place 1 drop into both eyes 2 (two) times daily.   ELIQUIS 5 MG TABS tablet TAKE 1 TABLET BY MOUTH TWICE A DAY   empagliflozin (JARDIANCE) 25 MG TABS tablet Take 1 tablet (25 mg total) by mouth daily.   fluticasone (FLONASE) 50 MCG/ACT nasal spray Place 2 sprays into both nostrils daily.   furosemide (LASIX) 20 MG tablet TAKE 1 TABLET BY MOUTH ONCE DAILY   glucose blood test strip CHECK FASTING GLUCOSE ONCE EACH MORNING, AND CHECK GLUCOSE AS NEEDED.   hydrALAZINE (APRESOLINE) 10 MG tablet TAKE 1 TABLET (10 MG TOTAL) BY MOUTH 4 (FOUR) TIMES DAILY AS NEEDED. IF BP ABOVE 155/80   ketoconazole (NIZORAL) 2 % cream APPLY 1 APPLICATION TOPICALLY DAILY   levothyroxine (SYNTHROID) 50 MCG tablet TAKE 1 TABLET (50 MCG TOTAL) BY MOUTH DAILY BEFORE BREAKFAST. AND TAKE TWO TABLETS TWICE A WEEK   loratadine (CLARITIN) 10 MG tablet Take 1 tablet (10 mg total) by mouth daily.   metFORMIN (GLUCOPHAGE) 850 MG tablet Take 1 tablet (850 mg total) by mouth 2 (two) times daily.   Misc Natural Products (NEURIVA PO) Take 1 tablet by mouth daily.   olmesartan (BENICAR) 40 MG tablet Take 1 tablet (40 mg total) by mouth daily.   pravastatin (PRAVACHOL) 40 MG tablet Take 1 tablet (40 mg total) by mouth daily.   pregabalin (LYRICA) 50 MG capsule Take 1 capsule (50 mg total) by mouth every evening.   tacrolimus (PROTOPIC) 0.1 % ointment APPLY TWICE A DAY TO RASH  UNDER BREAST AND IN GROIN FOR 2 WEEKS, THEN DECREASE TO EVERY DAY UNTIL CLEAR, THEN PRN FLARES   Travoprost, BAK Free, (TRAVATAN) 0.004 % SOLN ophthalmic solution Place 1 drop into both eyes at bedtime.   TRUE METRIX BLOOD GLUCOSE TEST test strip TEST BLOOD SUGAR IN THE MORNING, AT NOON, AND AT BEDTIME   TRUEplus Lancets 33G MISC USE AS DIRECTED   TRULICITY 3 MG/0.5ML SOPN once a week.   Vitamin D, Ergocalciferol, (DRISDOL) 1.25 MG (50000 UNIT) CAPS capsule Take 1 capsule (50,000 Units total) by mouth every 7 (seven) days.  Allergies:   Augmentin [amoxicillin-pot clavulanate]   Social History   Socioeconomic History   Marital status: Divorced    Spouse name: Not on file   Number of children: 6   Years of education: Not on file   Highest education level: 10th grade  Occupational History   Not on file  Tobacco Use   Smoking status: Never   Smokeless tobacco: Never   Tobacco comments:    Smoking cessation materials not required  Vaping Use   Vaping status: Never Used  Substance and Sexual Activity   Alcohol use: No    Alcohol/week: 0.0 standard drinks of alcohol   Drug use: No   Sexual activity: Not Currently  Other Topics Concern   Not on file  Social History Narrative   Pt lives with her son; independent ADL's, no longer drives   Social Determinants of Health   Financial Resource Strain: Low Risk  (02/05/2023)   Overall Financial Resource Strain (CARDIA)    Difficulty of Paying Living Expenses: Not hard at all  Recent Concern: Financial Resource Strain - High Risk (12/05/2022)   Overall Financial Resource Strain (CARDIA)    Difficulty of Paying Living Expenses: Very hard  Food Insecurity: No Food Insecurity (12/05/2022)   Hunger Vital Sign    Worried About Running Out of Food in the Last Year: Never true    Ran Out of Food in the Last Year: Never true  Transportation Needs: No Transportation Needs (02/05/2023)   PRAPARE - Administrator, Civil Service  (Medical): No    Lack of Transportation (Non-Medical): No  Physical Activity: Insufficiently Active (12/05/2022)   Exercise Vital Sign    Days of Exercise per Week: 7 days    Minutes of Exercise per Session: 20 min  Stress: No Stress Concern Present (12/05/2022)   [employer] of Occupational Health - Occupational Stress Questionnaire    Feeling of Stress : Not at all  Social Connections: Moderately Integrated (12/05/2022)   Social Connection and Isolation Panel [NHANES]    Frequency of Communication with Friends and Family: More than three times a week    Frequency of Social Gatherings with Friends and Family: More than three times a week    Attends Religious Services: More than 4 times per year    Active Member of [employer] or Organizations: Yes    Attends Banker Meetings: 1 to 4 times per year    Marital Status: Divorced     Family History: The patient's family history includes Dementia in her mother; Diabetes in her mother; Hypertension in her mother and son.  ROS:   Please see the history of present illness.     All other systems reviewed and are negative.  EKGs/Labs/Other Studies Reviewed:    The following studies were reviewed today:  Echo 05/2022 1. Left ventricular ejection fraction, by estimation, is 60 to 65%. The  left ventricle has normal function. The left ventricle has no regional  wall motion abnormalities. Left ventricular diastolic parameters are  consistent with Grade I diastolic  dysfunction (impaired relaxation).   2. Right ventricular systolic function is normal. The right ventricular  size is normal. There is moderately elevated pulmonary artery systolic  pressure. The estimated right ventricular systolic pressure is 48.8 mmHg.   3. Left atrial size was mildly dilated.   4. The mitral valve is normal in structure. Mild mitral valve  regurgitation. No evidence of mitral stenosis.   5. Tricuspid valve regurgitation is mild  to moderate.   6. The  aortic valve is normal in structure. Aortic valve regurgitation is  not visualized. Aortic valve sclerosis is present, with no evidence of  aortic valve stenosis. Aortic valve area, by VTI measures 1.63 cm. Aortic  valve mean gradient measures 7.0  mmHg.   7. The inferior vena cava is normal in size with greater than 50%  respiratory variability, suggesting right atrial pressure of 3 mmHg.   Heart monitor 11/2021 Patch Wear Time:  14 days and 0 hours (2022-12-16T15:20:22-0500 to 2022-12-30T15:20:26-0500)   Patient had a min HR of 55 bpm, max HR of 203 bpm, and avg HR of 74 bpm. Predominant underlying rhythm was Sinus Rhythm.  23 Supraventricular Tachycardia runs occurred, the run with the fastest interval lasting 8 beats with a max rate of 203 bpm, the longest lasting 20 beats with an avg rate of 159 bpm. Supraventricular Tachycardia was detected within +/- 45 seconds of symptomatic patient event(s). Occasional PACs and rare PVCs.  Myoview lexiscan 2019 Narrative & Impression  Pharmacological myocardial perfusion imaging study with no significant  Ischemia Small region of mild fixed perfusion defect in the apical and inferoapical region, likely secondary to attenuation artifact  Normal wall motion, EF estimated at 78% No EKG changes concerning for ischemia at peak stress or in recovery. Low risk scan     Signed, Dossie Arbour, MD, Ph.D [facility]    EKG:  EKG is ordered today.  The ekg ordered today demonstrates NSR 64bpm, nonspecific T wave changes  Recent Labs: 01/17/2023: TSH 3.20 05/19/2023: ALT 9; Hemoglobin 12.0; Platelets 265 07/08/2023: BUN 9; Creatinine, Ser 0.96; Potassium 4.4; Sodium 144  Recent Lipid Panel    Component Value Date/Time   CHOL 142 01/17/2023 1138   CHOL 146 12/19/2016 0951   TRIG 110 01/17/2023 1138   HDL 63 01/17/2023 1138   HDL 50 12/19/2016 0951   CHOLHDL 2.3 01/17/2023 1138   LDLCALC 59 01/17/2023 1138     Physical Exam:    VS:  BP 134/72  (BP Location: Left Arm, Patient Position: Sitting, Cuff Size: Normal)   Pulse 63   Ht 5\' 4"  (1.626 m)   Wt 187 lb 12.8 oz (85.2 kg)   LMP  (LMP Unknown)   SpO2 97%   BMI 32.24 kg/m     Wt Readings from Last 3 Encounters:  09/19/23 187 lb 12.8 oz (85.2 kg)  07/23/23 191 lb 6.4 oz (86.8 kg)  06/26/23 189 lb 1.6 oz (85.8 kg)     GEN:  Well nourished, well developed in no acute distress HEENT: Normal NECK: No JVD; No carotid bruits LYMPHATICS: No lymphadenopathy CARDIAC: RRR, no murmurs, rubs, gallops RESPIRATORY:  Clear to auscultation without rales, wheezing or rhonchi  ABDOMEN: Soft, non-tender, non-distended MUSCULOSKELETAL:  No edema; No deformity  SKIN: Warm and dry NEUROLOGIC:  Alert and oriented x 3 PSYCHIATRIC:  Normal affect   ASSESSMENT:    1. Paroxysmal atrial fibrillation (HCC)   2. Essential hypertension   3. Hyperlipidemia, mixed   4. Chronic diastolic heart failure (HCC)   5. Pulmonary hypertension, unspecified (HCC)    PLAN:    In order of problems listed above:  Paroxysmal Afib The patient reports occasional fluttering. She is in NSR on EKG. Continue Eliquis 5mg  BID for stroke ppx. Continue Cardizem 120mg  daily and Coreg 25mg BID for rate control.   HTN BP is good today. Continue Olmesartan 40mg  daily, Coreg 25mg BID, Hydralazine 10mg  QID, spironolactone 25mg  daily.  HLD LDL 59.  Continue Pravastatin.   Chronic diastolic heart failure Moderate to severe pulmonary HTN The patient is euvolemic on exam. She takes lasix most days. Continue Olmesartan, Jardiance, Coreg and spironolactone.   Disposition: Follow up in 6 month(s) with MD/APP    Signed, Genecis Veley David Stall, PA-C  09/19/2023 2:36 PM    [facility]

## 2023-09-19 NOTE — Patient Instructions (Signed)
Medication Instructions:  No changes at this time.   *If you need a refill on your cardiac medications before your next appointment, please call your pharmacy*   Lab Work: None  If you have labs (blood work) drawn today and your tests are completely normal, you will receive your results only by: MyChart Message (if you have MyChart) OR A paper copy in the mail If you have any lab test that is abnormal or we need to change your treatment, we will call you to review the results.   Testing/Procedures: None   Follow-Up: At [facility], you and your health needs are our priority.  As part of our continuing mission to provide you with exceptional heart care, we have created designated Provider Care Teams.  These Care Teams include your primary Cardiologist (physician) and Advanced Practice Providers (APPs -  Physician Assistants and Nurse Practitioners) who all work together to provide you with the care you need, when you need it.   Your next appointment:   6 month(s)  Provider:   Lorine Bears, MD or Cadence Fransico Michael, [place]

## 2023-09-22 DIAGNOSIS — H401132 Primary open-angle glaucoma, bilateral, moderate stage: Secondary | ICD-10-CM | POA: Diagnosis not present

## 2023-09-24 ENCOUNTER — Inpatient Hospital Stay: Payer: Medicare HMO | Attending: Oncology

## 2023-09-24 DIAGNOSIS — E538 Deficiency of other specified B group vitamins: Secondary | ICD-10-CM | POA: Diagnosis not present

## 2023-09-24 DIAGNOSIS — D508 Other iron deficiency anemias: Secondary | ICD-10-CM

## 2023-09-24 MED ORDER — CYANOCOBALAMIN 1000 MCG/ML IJ SOLN
1000.0000 ug | INTRAMUSCULAR | Status: DC
  Administered 2023-09-24: 1000 ug via INTRAMUSCULAR
  Filled 2023-09-24: qty 1

## 2023-10-01 DIAGNOSIS — H5347 Heteronymous bilateral field defects: Secondary | ICD-10-CM | POA: Diagnosis not present

## 2023-10-01 DIAGNOSIS — E119 Type 2 diabetes mellitus without complications: Secondary | ICD-10-CM | POA: Diagnosis not present

## 2023-10-01 DIAGNOSIS — Z0100 Encounter for examination of eyes and vision without abnormal findings: Secondary | ICD-10-CM | POA: Diagnosis not present

## 2023-10-01 DIAGNOSIS — H401132 Primary open-angle glaucoma, bilateral, moderate stage: Secondary | ICD-10-CM | POA: Diagnosis not present

## 2023-10-01 DIAGNOSIS — H43813 Vitreous degeneration, bilateral: Secondary | ICD-10-CM | POA: Diagnosis not present

## 2023-10-01 LAB — HM DIABETES EYE EXAM

## 2023-10-20 ENCOUNTER — Inpatient Hospital Stay: Payer: Medicare HMO

## 2023-10-22 ENCOUNTER — Inpatient Hospital Stay: Payer: Medicare HMO | Attending: Oncology

## 2023-10-22 DIAGNOSIS — D519 Vitamin B12 deficiency anemia, unspecified: Secondary | ICD-10-CM | POA: Insufficient documentation

## 2023-10-22 DIAGNOSIS — D508 Other iron deficiency anemias: Secondary | ICD-10-CM

## 2023-10-22 MED ORDER — CYANOCOBALAMIN 1000 MCG/ML IJ SOLN
1000.0000 ug | INTRAMUSCULAR | Status: DC
  Administered 2023-10-22: 1000 ug via INTRAMUSCULAR
  Filled 2023-10-22: qty 1

## 2023-11-20 ENCOUNTER — Inpatient Hospital Stay: Payer: Medicare HMO

## 2023-11-20 ENCOUNTER — Telehealth: Payer: Self-pay | Admitting: Family Medicine

## 2023-11-20 ENCOUNTER — Encounter: Payer: Self-pay | Admitting: Oncology

## 2023-11-20 ENCOUNTER — Inpatient Hospital Stay: Payer: Medicare HMO | Attending: Oncology

## 2023-11-20 DIAGNOSIS — D508 Other iron deficiency anemias: Secondary | ICD-10-CM

## 2023-11-20 DIAGNOSIS — C7A019 Malignant carcinoid tumor of the small intestine, unspecified portion: Secondary | ICD-10-CM | POA: Diagnosis not present

## 2023-11-20 DIAGNOSIS — D519 Vitamin B12 deficiency anemia, unspecified: Secondary | ICD-10-CM | POA: Insufficient documentation

## 2023-11-20 DIAGNOSIS — C7B00 Secondary carcinoid tumors, unspecified site: Secondary | ICD-10-CM

## 2023-11-20 DIAGNOSIS — E538 Deficiency of other specified B group vitamins: Secondary | ICD-10-CM

## 2023-11-20 LAB — CBC WITH DIFFERENTIAL/PLATELET
Abs Immature Granulocytes: 0.02 10*3/uL (ref 0.00–0.07)
Basophils Absolute: 0 10*3/uL (ref 0.0–0.1)
Basophils Relative: 1 %
Eosinophils Absolute: 0.2 10*3/uL (ref 0.0–0.5)
Eosinophils Relative: 2 %
HCT: 38.4 % (ref 36.0–46.0)
Hemoglobin: 11.9 g/dL — ABNORMAL LOW (ref 12.0–15.0)
Immature Granulocytes: 0 %
Lymphocytes Relative: 38 %
Lymphs Abs: 3.2 10*3/uL (ref 0.7–4.0)
MCH: 27.4 pg (ref 26.0–34.0)
MCHC: 31 g/dL (ref 30.0–36.0)
MCV: 88.5 fL (ref 80.0–100.0)
Monocytes Absolute: 0.6 10*3/uL (ref 0.1–1.0)
Monocytes Relative: 8 %
Neutro Abs: 4.3 10*3/uL (ref 1.7–7.7)
Neutrophils Relative %: 51 %
Platelets: 286 10*3/uL (ref 150–400)
RBC: 4.34 MIL/uL (ref 3.87–5.11)
RDW: 15 % (ref 11.5–15.5)
WBC: 8.4 10*3/uL (ref 4.0–10.5)
nRBC: 0 % (ref 0.0–0.2)

## 2023-11-20 LAB — VITAMIN B12: Vitamin B-12: 267 pg/mL (ref 180–914)

## 2023-11-20 MED ORDER — CYANOCOBALAMIN 1000 MCG/ML IJ SOLN
1000.0000 ug | INTRAMUSCULAR | Status: DC
  Administered 2023-11-20: 1000 ug via INTRAMUSCULAR
  Filled 2023-11-20: qty 1

## 2023-11-20 NOTE — Telephone Encounter (Signed)
Pt requesting a return call. She would like to reschedule her AWV appt. She does not wish to do it over the phone due to hearing problems but also does not want it that early in the morning.

## 2023-11-22 LAB — CHROMOGRANIN A: Chromogranin A (ng/mL): 228.3 ng/mL — ABNORMAL HIGH (ref 0.0–101.8)

## 2023-11-26 NOTE — Telephone Encounter (Signed)
Patient rescheduled AWV appointment to 07/22/2024.

## 2023-12-03 ENCOUNTER — Encounter: Payer: Self-pay | Admitting: Oncology

## 2023-12-12 ENCOUNTER — Other Ambulatory Visit: Payer: Self-pay | Admitting: Family Medicine

## 2023-12-12 DIAGNOSIS — I5032 Chronic diastolic (congestive) heart failure: Secondary | ICD-10-CM

## 2023-12-12 DIAGNOSIS — E1169 Type 2 diabetes mellitus with other specified complication: Secondary | ICD-10-CM

## 2023-12-12 DIAGNOSIS — E890 Postprocedural hypothyroidism: Secondary | ICD-10-CM

## 2023-12-12 DIAGNOSIS — I700 Atherosclerosis of aorta: Secondary | ICD-10-CM

## 2023-12-12 NOTE — Telephone Encounter (Signed)
 Requested Prescriptions  Pending Prescriptions Disp Refills   levothyroxine  (SYNTHROID ) 50 MCG tablet [Pharmacy Med Name: LEVOTHYROXINE  50 MCG TABLET] 114 tablet 0    Sig: TAKE 1 TABLET (50 MCG TOTAL) BY MOUTH DAILY BEFORE BREAKFAST. AND TAKE TWO TABLETS TWICE A WEEK     Endocrinology:  Hypothyroid Agents Passed - 12/12/2023  2:43 PM      Passed - TSH in normal range and within 360 days    TSH  Date Value Ref Range Status  01/17/2023 3.20 0.40 - 4.50 mIU/L Final         Passed - Valid encounter within last 12 months    Recent Outpatient Visits           4 months ago Dyslipidemia associated with type 2 diabetes mellitus [facility])   [facility] Glenard Mire, MD   5 months ago COVID-19   [facility] Bernardo Fend, DO   7 months ago Benign hypertension   [place] [facility] Glenard Mire, MD   7 months ago Benign hypertension   [facility] Glenard Mire, MD   10 months ago Dyslipidemia associated with type 2 diabetes mellitus [facility])   [place] [facility] Glenard Mire, MD       Future Appointments             In 1 month Glenard, Krichna, MD [facility]             olmesartan  (BENICAR ) 40 MG tablet [Pharmacy Med Name: OLMESARTAN  MEDOXOMIL 40 MG TAB] 90 tablet 0    Sig: TAKE 1 TABLET BY MOUTH EVERY DAY     Cardiovascular:  Angiotensin Receptor Blockers Passed - 12/12/2023  2:43 PM      Passed - Cr in normal range and within 180 days    Creat  Date Value Ref Range Status  01/17/2023 1.03 (H) 0.60 - 0.95 mg/dL Final   Creatinine, Ser  Date Value Ref Range Status  07/08/2023 0.96 0.57 - 1.00 mg/dL Final   Creatinine, Urine  Date Value Ref Range Status  01/17/2023 18 (L) 20 - 275 mg/dL Final         Passed - K in normal range and within 180 days    Potassium  Date Value Ref Range Status  07/08/2023 4.4 3.5 - 5.2  mmol/L Final  03/28/2014 3.6 3.5 - 5.1 mmol/L Final         Passed - Patient is not pregnant      Passed - Last BP in normal range    BP Readings from Last 1 Encounters:  09/19/23 134/72         Passed - Valid encounter within last 6 months    Recent Outpatient Visits           4 months ago Dyslipidemia associated with type 2 diabetes mellitus [facility])   [facility] Glenard Mire, MD   5 months ago COVID-19   [facility] Bernardo Fend, DO   7 months ago Benign hypertension   [facility] Glenard Mire, MD   7 months ago Benign hypertension   [place] [facility] [place], Mire, MD   10 months ago Dyslipidemia associated with type 2 diabetes mellitus [facility])   [place] [facility] Glenard Mire, MD       Future Appointments  In 1 month Sowles, Krichna, MD [facility], [facility]             metFORMIN  (GLUCOPHAGE ) 850 MG tablet [Pharmacy Med Name: METFORMIN  HCL 850 MG TABLET] 180 tablet 0    Sig: TAKE 1 TABLET BY MOUTH 2 TIMES DAILY.     Endocrinology:  Diabetes - Biguanides Failed - 12/12/2023  2:43 PM      Failed - eGFR in normal range and within 360 days    GFR, Est African American  Date Value Ref Range Status  03/22/2021 46 (L) > OR = 60 mL/min/1.71m2 Final   GFR, Est Non African American  Date Value Ref Range Status  03/22/2021 39 (L) > OR = 60 mL/min/1.78m2 Final   GFR, Estimated  Date Value Ref Range Status  05/19/2023 55 (L) >60 mL/min Final    Comment:    (NOTE) Calculated using the CKD-EPI Creatinine Equation (2021)    eGFR  Date Value Ref Range Status  07/08/2023 57 (L) >59 mL/min/1.73 Final         Passed - Cr in normal range and within 360 days    Creat  Date Value Ref Range Status  01/17/2023 1.03 (H) 0.60 - 0.95 mg/dL Final   Creatinine, Ser  Date Value Ref Range Status  07/08/2023  0.96 0.57 - 1.00 mg/dL Final   Creatinine, Urine  Date Value Ref Range Status  01/17/2023 18 (L) 20 - 275 mg/dL Final         Passed - HBA1C is between 0 and 7.9 and within 180 days    Hemoglobin A1C  Date Value Ref Range Status  07/23/2023 6.3 (A) 4.0 - 5.6 % Final   HbA1c, POC (controlled diabetic range)  Date Value Ref Range Status  06/26/2018 7.5 (A) 0.0 - 7.0 % Final   Hgb A1c MFr Bld  Date Value Ref Range Status  01/16/2022 6.9 (H) <5.7 % of total Hgb Final    Comment:    For someone without known diabetes, a hemoglobin A1c value of 6.5% or greater indicates that they may have  diabetes and this should be confirmed with a follow-up  test. . For someone with known diabetes, a value <7% indicates  that their diabetes is well controlled and a value  greater than or equal to 7% indicates suboptimal  control. A1c targets should be individualized based on  duration of diabetes, age, comorbid conditions, and  other considerations. . Currently, no consensus exists regarding use of hemoglobin A1c for diagnosis of diabetes for children. .          Passed - B12 Level in normal range and within 720 days    Vitamin B-12  Date Value Ref Range Status  11/20/2023 267 180 - 914 pg/mL Final    Comment:    (NOTE) This assay is not validated for testing neonatal or myeloproliferative syndrome specimens for Vitamin B12 levels. Performed at [facility], [address], [place], [place] [address]          Passed - Valid encounter within last 6 months    Recent Outpatient Visits           4 months ago Dyslipidemia associated with type 2 diabetes mellitus [facility])   [facility] Glenard Mire, MD   5 months ago COVID-19   [facility] Fend, DO   7 months ago Benign hypertension   [facility] Katie,  Dorette, MD   7 months ago Benign hypertension   [facility] Glenard Dorette, MD   10 months ago Dyslipidemia associated with type 2 diabetes mellitus [facility])   [place] [facility] Glenard Dorette, MD       Future Appointments             In 1 month Glenard, Krichna, MD [facility]            Passed - CBC within normal limits and completed in the last 12 months    WBC  Date Value Ref Range Status  11/20/2023 8.4 4.0 - 10.5 K/uL Final   RBC  Date Value Ref Range Status  11/20/2023 4.34 3.87 - 5.11 MIL/uL Final   Hemoglobin  Date Value Ref Range Status  11/20/2023 11.9 (L) 12.0 - 15.0 g/dL Final  97/84/7981 88.1 11.1 - 15.9 g/dL Final   HCT  Date Value Ref Range Status  11/20/2023 38.4 36.0 - 46.0 % Final   Hematocrit  Date Value Ref Range Status  12/19/2016 37.9 34.0 - 46.6 % Final   MCHC  Date Value Ref Range Status  11/20/2023 31.0 30.0 - 36.0 g/dL Final   [facility]  Date Value Ref Range Status  11/20/2023 27.4 26.0 - 34.0 pg Final   MCV  Date Value Ref Range Status  11/20/2023 88.5 80.0 - 100.0 fL Final  12/19/2016 86 79 - 97 fL Final  03/28/2014 81 80 - 100 fL Final   No results found for: PLTCOUNTKUC, LABPLAT, POCPLA RDW  Date Value Ref Range Status  11/20/2023 15.0 11.5 - 15.5 % Final  12/19/2016 16.6 (H) 12.3 - 15.4 % Final  03/28/2014 17.0 (H) 11.5 - 14.5 % Final          pravastatin  (PRAVACHOL ) 40 MG tablet [Pharmacy Med Name: PRAVASTATIN  SODIUM 40 MG TAB] 90 tablet 0    Sig: TAKE 1 TABLET BY MOUTH EVERY DAY     Cardiovascular:  Antilipid - Statins Failed - 12/12/2023  2:43 PM      Failed - Lipid Panel in normal range within the last 12 months    Cholesterol, Total  Date Value Ref Range Status  12/19/2016 146 100 - 199 mg/dL Final   Cholesterol  Date Value Ref Range Status  01/17/2023 142 <200 mg/dL Final   LDL Cholesterol (Calc)  Date Value Ref Range Status  01/17/2023 59 mg/dL (calc) Final    Comment:    Reference range:  <100 . Desirable range <100 mg/dL for primary prevention;   <70 mg/dL for patients with CHD or diabetic patients  with > or = 2 CHD risk factors. SABRA LDL-C is now calculated using the Martin-Hopkins  calculation, which is a validated novel method providing  better accuracy than the Friedewald equation in the  estimation of LDL-C.  Gladis APPLETHWAITE et al. SANDREA. 7986;689(80): 2061-2068  (http://education.[employer].com/faq/FAQ164)    HDL  Date Value Ref Range Status  01/17/2023 63 > OR = 50 mg/dL Final  97/84/7981 50 >60 mg/dL Final   Triglycerides  Date Value Ref Range Status  01/17/2023 110 <150 mg/dL Final         Passed - Patient is not pregnant      Passed - Valid encounter within last 12 months    Recent Outpatient Visits           4 months ago Dyslipidemia associated with type 2 diabetes mellitus (HCC)   [facility] Glenard Mire, MD   5 months ago COVID-19   [facility] [place], [place]   7 months ago Benign hypertension   [facility] Glen[facility] Mire, MD   7 months ago Benign hypertension   [place] [facility] [place], Mire, MD   10 months ago Dyslipidemia associated with type 2 diabetes mellitus [facility])   [place] [facility] Sowles, Krichna, MD       Future Appointments             In 1 month Sowles, Krichna, MD [facility]             empagliflozin  (JARDIANCE ) 25 MG TABS tablet [Pharmacy Med Name: JARDIANCE  25 MG TABLET] 90 tablet 0    Sig: TAKE 1 TABLET (25 MG TOTAL) BY MOUTH DAILY.     Endocrinology:  Diabetes - SGLT2 Inhibitors Failed - 12/12/2023  2:43 PM      Failed - eGFR in normal range and within 360 days    GFR, Est African American  Date Value Ref Range Status  03/22/2021 46 (L) > OR = 60 mL/min/1.22m2 Final   GFR, Est Non African American  Date Value Ref Range Status  03/22/2021 39  (L) > OR = 60 mL/min/1.35m2 Final   GFR, Estimated  Date Value Ref Range Status  05/19/2023 55 (L) >60 mL/min Final    Comment:    (NOTE) Calculated using the CKD-EPI Creatinine Equation (2021)    eGFR  Date Value Ref Range Status  07/08/2023 57 (L) >59 mL/min/1.73 Final         Passed - Cr in normal range and within 360 days    Creat  Date Value Ref Range Status  01/17/2023 1.03 (H) 0.60 - 0.95 mg/dL Final   Creatinine, Ser  Date Value Ref Range Status  07/08/2023 0.96 0.57 - 1.00 mg/dL Final   Creatinine, Urine  Date Value Ref Range Status  01/17/2023 18 (L) 20 - 275 mg/dL Final         Passed - HBA1C is between 0 and 7.9 and within 180 days    Hemoglobin A1C  Date Value Ref Range Status  07/23/2023 6.3 (A) 4.0 - 5.6 % Final   HbA1c, POC (controlled diabetic range)  Date Value Ref Range Status  06/26/2018 7.5 (A) 0.0 - 7.0 % Final   Hgb A1c MFr Bld  Date Value Ref Range Status  01/16/2022 6.9 (H) <5.7 % of total Hgb Final    Comment:    For someone without known diabetes, a hemoglobin A1c value of 6.5% or greater indicates that they may have  diabetes and this should be confirmed with a follow-up  test. . For someone with known diabetes, a value <7% indicates  that their diabetes is well controlled and a value  greater than or equal to 7% indicates suboptimal  control. A1c targets should be individualized based on  duration of diabetes, age, comorbid conditions, and  other considerations. . Currently, no consensus exists regarding use of hemoglobin A1c for diagnosis of diabetes for children. SABRA Amy - Valid encounter within last 6 months    Recent Outpatient Visits           4 months ago Dyslipidemia associated with type 2 diabetes mellitus [facility])   [place] [facility] Glenard Mire, MD   5 months ago COVID-19  [facility] Bernardo [place]end, [place]   7 months ago Benign hypertension   [facility] Glen[facility] Mire, MD   7 months ago Benign hypertension   [place] [facility] [place], Mire, MD   10 months ago Dyslipidemia associated with type 2 diabetes mellitus [facility])   [place] [facility] Sowles, Krichna, MD       Future Appointments             In 1 month Glenard, Krichna, MD [facility], [facility]

## 2023-12-15 ENCOUNTER — Encounter: Payer: Self-pay | Admitting: Oncology

## 2023-12-18 ENCOUNTER — Telehealth: Payer: Self-pay | Admitting: *Deleted

## 2023-12-18 NOTE — Telephone Encounter (Signed)
Pt called to see when the appt is for inj. of B12. It is 12/22/23 at 3:30 and she has another one for 01/19/24 at  3:30. She put it on her calendar

## 2023-12-19 ENCOUNTER — Other Ambulatory Visit: Payer: Self-pay | Admitting: Internal Medicine

## 2023-12-19 ENCOUNTER — Other Ambulatory Visit: Payer: Self-pay | Admitting: Cardiovascular Disease

## 2023-12-19 ENCOUNTER — Other Ambulatory Visit: Payer: Self-pay | Admitting: Family Medicine

## 2023-12-19 DIAGNOSIS — I5032 Chronic diastolic (congestive) heart failure: Secondary | ICD-10-CM

## 2023-12-19 NOTE — Telephone Encounter (Signed)
Requested Prescriptions  Pending Prescriptions Disp Refills   fluticasone (FLONASE) 50 MCG/ACT nasal spray [Pharmacy Med Name: FLUTICASONE PROP 50 MCG SPRAY] 48 mL 2    Sig: SPRAY 2 SPRAYS INTO EACH NOSTRIL EVERY DAY     Ear, Nose, and Throat: Nasal Preparations - Corticosteroids Passed - 12/19/2023  3:27 PM      Passed - Valid encounter within last 12 months    Recent Outpatient Visits           4 months ago Dyslipidemia associated with type 2 diabetes mellitus [facility])   [place] [facility] Al[facility] Cory, MD   5 months ago COVID-19   [facility] Margarita Mail, DO   7 months ago Benign hypertension   [place] [facility] Alba Cory, MD   7 months ago Benign hypertension   [facility] Alba Cory, MD   11 months ago Dyslipidemia associated with type 2 diabetes mellitus [facility])   [facility] Alba Cory, MD       Future Appointments             In 1 month Carlynn Purl, Danna Hefty, MD [facility], [facility]

## 2023-12-22 ENCOUNTER — Inpatient Hospital Stay: Payer: Medicare HMO | Attending: Oncology

## 2023-12-22 DIAGNOSIS — D519 Vitamin B12 deficiency anemia, unspecified: Secondary | ICD-10-CM | POA: Insufficient documentation

## 2023-12-22 DIAGNOSIS — D508 Other iron deficiency anemias: Secondary | ICD-10-CM

## 2023-12-22 MED ORDER — CYANOCOBALAMIN 1000 MCG/ML IJ SOLN
1000.0000 ug | INTRAMUSCULAR | Status: DC
  Administered 2023-12-22: 1000 ug via INTRAMUSCULAR
  Filled 2023-12-22: qty 1

## 2024-01-03 LAB — HEMOGLOBIN A1C: Hemoglobin A1C: 6.6

## 2024-01-19 ENCOUNTER — Inpatient Hospital Stay: Payer: Medicare HMO

## 2024-01-20 ENCOUNTER — Encounter: Payer: Self-pay | Admitting: Family Medicine

## 2024-01-20 ENCOUNTER — Ambulatory Visit (INDEPENDENT_AMBULATORY_CARE_PROVIDER_SITE_OTHER): Payer: Medicare HMO | Admitting: Family Medicine

## 2024-01-20 VITALS — BP 122/70 | HR 80 | Resp 16 | Ht 64.0 in | Wt 191.9 lb

## 2024-01-20 DIAGNOSIS — M5030 Other cervical disc degeneration, unspecified cervical region: Secondary | ICD-10-CM

## 2024-01-20 DIAGNOSIS — I739 Peripheral vascular disease, unspecified: Secondary | ICD-10-CM | POA: Diagnosis not present

## 2024-01-20 DIAGNOSIS — I272 Pulmonary hypertension, unspecified: Secondary | ICD-10-CM

## 2024-01-20 DIAGNOSIS — I480 Paroxysmal atrial fibrillation: Secondary | ICD-10-CM | POA: Diagnosis not present

## 2024-01-20 DIAGNOSIS — E1169 Type 2 diabetes mellitus with other specified complication: Secondary | ICD-10-CM

## 2024-01-20 DIAGNOSIS — I10 Essential (primary) hypertension: Secondary | ICD-10-CM

## 2024-01-20 DIAGNOSIS — Z7984 Long term (current) use of oral hypoglycemic drugs: Secondary | ICD-10-CM

## 2024-01-20 DIAGNOSIS — I700 Atherosclerosis of aorta: Secondary | ICD-10-CM

## 2024-01-20 DIAGNOSIS — N1831 Chronic kidney disease, stage 3a: Secondary | ICD-10-CM

## 2024-01-20 DIAGNOSIS — C772 Secondary and unspecified malignant neoplasm of intra-abdominal lymph nodes: Secondary | ICD-10-CM

## 2024-01-20 DIAGNOSIS — E559 Vitamin D deficiency, unspecified: Secondary | ICD-10-CM

## 2024-01-20 DIAGNOSIS — E890 Postprocedural hypothyroidism: Secondary | ICD-10-CM

## 2024-01-20 DIAGNOSIS — C7A019 Malignant carcinoid tumor of the small intestine, unspecified portion: Secondary | ICD-10-CM

## 2024-01-20 DIAGNOSIS — R2681 Unsteadiness on feet: Secondary | ICD-10-CM

## 2024-01-20 DIAGNOSIS — I5032 Chronic diastolic (congestive) heart failure: Secondary | ICD-10-CM

## 2024-01-20 LAB — POCT GLYCOSYLATED HEMOGLOBIN (HGB A1C): Hemoglobin A1C: 6.4 % — AB (ref 4.0–5.6)

## 2024-01-20 MED ORDER — PREGABALIN 50 MG PO CAPS: 50.0000 mg | ORAL_CAPSULE | Freq: Every evening | ORAL | 1 refills | Status: AC

## 2024-01-20 MED ORDER — PRAVASTATIN SODIUM 40 MG PO TABS: 40.0000 mg | ORAL_TABLET | Freq: Every day | ORAL | 1 refills | Status: DC

## 2024-01-20 MED ORDER — VITAMIN D (ERGOCALCIFEROL) 1.25 MG (50000 UNIT) PO CAPS: 50000.0000 [IU] | ORAL_CAPSULE | ORAL | 1 refills | Status: DC

## 2024-01-20 MED ORDER — OLMESARTAN MEDOXOMIL 40 MG PO TABS
40.0000 mg | ORAL_TABLET | Freq: Every day | ORAL | 1 refills | Status: DC
Start: 2024-01-20 — End: 2024-07-23

## 2024-01-20 MED ORDER — CARVEDILOL 25 MG PO TABS
25.0000 mg | ORAL_TABLET | Freq: Two times a day (BID) | ORAL | 1 refills | Status: DC
Start: 2024-01-20 — End: 2024-07-23

## 2024-01-20 MED ORDER — EMPAGLIFLOZIN 25 MG PO TABS
25.0000 mg | ORAL_TABLET | Freq: Every day | ORAL | 1 refills | Status: DC
Start: 2024-01-20 — End: 2024-07-23

## 2024-01-20 MED ORDER — METFORMIN HCL ER 500 MG PO TB24: 500.0000 mg | ORAL_TABLET | Freq: Every day | ORAL | 1 refills | Status: DC

## 2024-01-20 NOTE — Progress Notes (Signed)
 Name: Susan Bowen   MRN: [id number]    DOB: 07/28/1932   Date:01/20/2024       Progress Note  Subjective  Chief Complaint  Chief Complaint  Patient presents with   Medical Management of Chronic Issues   HPI   Thyroid mass: US done in 10/2020 showed, history of thyroid ablation, taking levothyroxine.  Last TSH was normal - last level checked 01/2023.    IMPRESSION: 1. Atrophic and moderately heterogeneous thyroid without worrisome new or enlarging thyroid nodules 2. Dominant right-sided thyroid nodule has decreased in size compared to the 2015 examination. Imaging stability for greater than 5 years is indicative of benign etiology. 3. None of the other discretely measured nodules/pseudo nodules meet imaging criteria to recommend percutaneous sampling or continued dedicated follow-up.   DMII with microalbuminuria and CKI stage III :  she is taking medications Trulicity, Metformin and Jardiance, ARB and statin therapy. She has not been checking her glucose lately   She denies polyphagia, polydipsia or  polyuria. Her hgbA1C today is  6.4 % . We are going to change Metformin from BID to XR 500 mg daily since A1C is at goal .    CKI stage III: GFR in the 50's range and stable over the past few times.  She has good urine output and no pruritis Urine micro used to be elevated but last level was normal.  She is on SGl-2 agonist and ARB   HTN: she has been taking medication. No chest pain, but occasionally has heart flutter. She is takes Cardizem, Carvedilol and Benicar, furosemide and recently started on Spironolactone and bp today is at goal. She still has some hydralazine at home to take prn only    Afib/CHF diastolic /pulmonary HTN  : under the care of Dr. Kirke Corin, rate controlled, she has sob with activity but very seldom, she has mild orthopnea ( uses two pillows) , she states palpitation has not been present lately, she denies chest pain. She has some lower extremity edema but chronic and  stable. She still takes lasix daily  She also has pulmonary hypertension seen on  Echo. She is on Eliquis.  EF 60-65 % on Echo done 05/2022 . Recently started on spironolactone    Hyperlipidemia: taking Pravastatin and denies side effects of medications, no myalgia. We will recheck labs    Aorta atherosclerosis on CT chest: on statin and Eliquis.  Unchanged    Carcinoid tumor with metastasis: incidental finding on CT abdomen done for evaluation of renal stone back in 03/26/2019, it showed retroperitoneal lymphadenopathy, she has since seen Dr. 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 She is not on therapy just watchful waiting.  She has regular follow ups, she still has episodes of diarrhea is down to 3 times a month that last 24 - 48 hours.   IMPRESSION: CT done 05/2023  1. Stable to slight decrease in size of the 2 enlarged lymph nodes  within the central portion of the lower abdomen compatible with  carcinoid nodal metastasis.  2. Aortic Atherosclerosis (ICD10-I70.0).    Anemia secondary to iron  and B12 deficiency: under the care of Dr. Smith Robert , getting B12 injections monthly , last level was 267, she states last iron infusion was 12/2023 and did not need to have it done this month   Intermittent low back pain/neck pain : cannot take NSAID's and has been off Tylenol with codeine, she states Lyrica is controlling symptoms and  takes one at night.   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) 09/18/2021   Iron deficiency anemia 12/04/2020   Carcinoid tumor, malignant (HCC) 09/08/2019   Pain in joint of left shoulder 02/25/2019   Pulmonary hypertension, unspecified (HCC) 12/28/2018   Paroxysmal A-fib (HCC) 12/28/2018   Chronic kidney  disease, stage III (moderate) (HCC) 04/01/2018   Elevated parathyroid hormone 01/01/2018   Elevated uric acid in blood 04/29/2017   Plantar fasciitis of left foot 03/12/2016   Postprocedural hypothyroidism 01/23/2016   Aortic stenosis 09/26/2015   Allergic rhinitis, seasonal 08/08/2015   Benign hypertension 08/08/2015   History of pneumonia 08/08/2015   Carpal tunnel syndrome 08/08/2015   Cataract 08/08/2015   Insomnia, persistent 08/08/2015   Dyslipidemia 08/08/2015   History of depression 08/08/2015   Glaucoma 08/08/2015   Controlled gout 08/08/2015   Fever blister 08/08/2015   Bilateral hearing loss 08/08/2015   Hemorrhoid 08/08/2015   H/O iron deficiency anemia 08/08/2015   Benign neoplasm of colon 08/08/2015   Impingement syndrome of shoulder 08/08/2015   Osteoporosis, post-menopausal 08/08/2015   Generalized OA 08/08/2015   Multinodular goiter 08/08/2015   Asymptomatic varicose veins 08/08/2015   Polyp of vocal cord 08/08/2015   History of vertebral compression fracture 08/08/2015   Diabetes mellitus with proteinuric diabetic nephropathy (HCC) 08/08/2015   LVH (left ventricular hypertrophy) 08/08/2015   Moderate tricuspid regurgitation 08/08/2015   History of radioactive iodine thyroid ablation 08/08/2015   Lung nodule, solitary 05/12/2014   Chronic cough 05/11/2014   Vitamin D deficiency 12/20/2009   Plantar fascial fibromatosis 12/20/2009   Personal history of fall 07/20/2008   Cervical radiculitis 05/11/2008    Past Surgical History:  Procedure Laterality Date   ABDOMINAL HYSTERECTOMY  1986   APPENDECTOMY     BREAST SURGERY Bilateral 1983   biopsy of each side, negative   CARPAL TUNNEL RELEASE     CATARACT EXTRACTION W/PHACO Left 09/03/2017   Procedure: CATARACT EXTRACTION PHACO AND INTRAOCULAR LENS PLACEMENT (IOC)-LEFT DIABETIC;  Surgeon: Galen Manila, MD;  Location: [facility] ORS;  Service: Ophthalmology;  Laterality: Left;  [place] 01:10.2 AP% 16.7 CDE  11.71 Fluid Pack Lot # [id number]   CATARACT EXTRACTION W/PHACO Right 09/30/2017   Procedure: CATARACT EXTRACTION PHACO AND INTRAOCULAR LENS PLACEMENT (IOC);  Surgeon: Galen Manila, MD;  Location: [facility] ORS;  Service: Ophthalmology;  Laterality: Right;  [place] 01:35.1 AP% 13.1 CDE 12.44 Fluid pack Lot # [id number]   COLON SURGERY     EYE SURGERY Left 09/03/2017   Cataract extraction   FEMUR FRACTURE SURGERY Left    FRACTURE SURGERY  2001   left femur   HEMORROIDECTOMY     MULTIPLE TOOTH EXTRACTIONS     polyp removed from vocal cord      Family History  Problem Relation Age of Onset   Diabetes Mother    Hypertension Mother    Dementia Mother    Hypertension Son     Social History   Tobacco Use   Smoking status: Never   Smokeless tobacco: Never   Tobacco comments:    Smoking cessation materials not required  Substance Use Topics   Alcohol use: No    Alcohol/week: 0.0 standard drinks of alcohol     Current Outpatient Medications:    acetaminophen (TYLENOL) 500 MG tablet, Take  500 mg by mouth every 6 (six) hours as needed., Disp: , Rfl:    Alcohol Swabs (DROPSAFE ALCOHOL PREP) 70 % PADS, USE TWICE DAILY, Disp: 200 each, Rfl: 3   Blood Glucose Monitoring Suppl (ACCU-CHEK GUIDE) w/Device KIT, PLEASE SEE ATTACHED FOR DETAILED DIRECTIONS, Disp: , Rfl:    Blood Glucose Monitoring Suppl DEVI, 1 each by Does not apply route in the morning, at noon, and at bedtime. May substitute to any manufacturer covered by patient's insurance., Disp: 1 each, Rfl: 0   carvedilol (COREG) 25 MG tablet, TAKE 1 TABLET BY MOUTH TWICE A DAY, Disp: 180 tablet, Rfl: 1   diltiazem (CARDIZEM CD) 120 MG 24 hr capsule, TAKE 1 CAPSULE BY MOUTH EVERY DAY, Disp: 90 capsule, Rfl: 3   dorzolamide-timolol (COSOPT) 22.3-6.8 MG/ML ophthalmic solution, Place 1 drop into both eyes 2 (two) times daily., Disp: , Rfl:    ELIQUIS 5 MG TABS tablet, TAKE 1 TABLET BY MOUTH TWICE A DAY, Disp: 180 tablet, Rfl: 1   empagliflozin  (JARDIANCE) 25 MG TABS tablet, TAKE 1 TABLET (25 MG TOTAL) BY MOUTH DAILY., Disp: 90 tablet, Rfl: 0   fluticasone (FLONASE) 50 MCG/ACT nasal spray, SPRAY 2 SPRAYS INTO EACH NOSTRIL EVERY DAY, Disp: 48 mL, Rfl: 2   furosemide (LASIX) 20 MG tablet, TAKE 1 TABLET BY MOUTH EVERY DAY, Disp: 90 tablet, Rfl: 0   glucose blood test strip, CHECK FASTING GLUCOSE ONCE EACH MORNING, AND CHECK GLUCOSE AS NEEDED., Disp: , Rfl:    hydrALAZINE (APRESOLINE) 10 MG tablet, TAKE 1 TABLET (10 MG TOTAL) BY MOUTH 4 (FOUR) TIMES DAILY AS NEEDED. IF BP ABOVE 155/80, Disp: 120 tablet, Rfl: 0   ketoconazole (NIZORAL) 2 % cream, APPLY 1 APPLICATION TOPICALLY DAILY, Disp: 60 g, Rfl: 0   levothyroxine (SYNTHROID) 50 MCG tablet, TAKE 1 TABLET (50 MCG TOTAL) BY MOUTH DAILY BEFORE BREAKFAST. AND TAKE TWO TABLETS TWICE A WEEK, Disp: 114 tablet, Rfl: 0   loratadine (CLARITIN) 10 MG tablet, Take 1 tablet (10 mg total) by mouth daily., Disp: 90 tablet, Rfl: 1   metFORMIN (GLUCOPHAGE) 850 MG tablet, TAKE 1 TABLET BY MOUTH 2 TIMES DAILY., Disp: 180 tablet, Rfl: 0   Misc Natural Products (NEURIVA PO), Take 1 tablet by mouth daily., Disp: , Rfl:    olmesartan (BENICAR) 40 MG tablet, TAKE 1 TABLET BY MOUTH EVERY DAY, Disp: 90 tablet, Rfl: 0   pravastatin (PRAVACHOL) 40 MG tablet, TAKE 1 TABLET BY MOUTH EVERY DAY, Disp: 90 tablet, Rfl: 0   pregabalin (LYRICA) 50 MG capsule, Take 1 capsule (50 mg total) by mouth every evening., Disp: 90 capsule, Rfl: 0   tacrolimus (PROTOPIC) 0.1 % ointment, APPLY TWICE A DAY TO RASH UNDER BREAST AND IN GROIN FOR 2 WEEKS, THEN DECREASE TO EVERY DAY UNTIL CLEAR, THEN PRN FLARES, Disp: 100 g, Rfl: 2   Travoprost, BAK Free, (TRAVATAN) 0.004 % SOLN ophthalmic solution, Place 1 drop into both eyes at bedtime., Disp: , Rfl:    TRUE METRIX BLOOD GLUCOSE TEST test strip, TEST BLOOD SUGAR IN THE MORNING, AT NOON, AND AT BEDTIME, Disp: 100 strip, Rfl: 0   TRUEplus Lancets 33G MISC, USE AS DIRECTED, Disp: 300 each, Rfl:  3   TRULICITY 3 MG/0.5ML SOPN, once a week., Disp: , Rfl:    Vitamin D, Ergocalciferol, (DRISDOL) 1.25 MG (50000 UNIT) CAPS capsule, Take 1 capsule (50,000 Units total) by mouth every 7 (seven) days., Disp: 12 capsule, Rfl: 1   Blood Glucose Calibration (TRUE METRIX LEVEL  1) Low SOLN, 1 each by In Vitro route once for 1 dose., Disp: 1 each, Rfl: 3   spironolactone (ALDACTONE) 25 MG tablet, Take 1 tablet (25 mg total) by mouth daily., Disp: 90 tablet, Rfl: 3 No current facility-administered medications for this visit.  Facility-Administered Medications Ordered in Other Visits:    cyanocobalamin (VITAMIN B12) injection 1,000 mcg, 1,000 mcg, Intramuscular, Q30 days, Creig Hines, MD, 1,000 mcg at 10/14/22 1540   cyanocobalamin (VITAMIN B12) injection 1,000 mcg, 1,000 mcg, Intramuscular, Once, Creig Hines, MD   cyanocobalamin (VITAMIN B12) injection 1,000 mcg, 1,000 mcg, Intramuscular, Q30 days, Creig Hines, MD, 1,000 mcg at 05/19/23 1131  Allergies  Allergen Reactions   Augmentin [Amoxicillin-Pot Clavulanate] Itching    Has patient had a PCN reaction causing immediate rash, facial/tongue/throat swelling, SOB or lightheadedness with hypotension: No Has patient had a PCN reaction causing severe rash involving mucus membranes or skin necrosis: No Has patient had a PCN reaction that required hospitalization: No Has patient had a PCN reaction occurring within the last 10 years: No If all of the above answers are "NO", then may proceed with Cephalosporin use.     I personally reviewed active problem list, medication list, allergies with the patient/caregiver today.   ROS  Ten systems reviewed and is negative except as mentioned in HPI    Objective  Vitals:   01/20/24 1105  BP: 122/70  Pulse: 80  Resp: 16  SpO2: 96%  Weight: 191 lb 14.4 oz (87 kg)  Height: 5\' 4"  (1.626 m)    Body mass index is 32.94 kg/m.  Physical Exam  Constitutional: Patient appears well-developed and  well-nourished.  No distress.  HEENT: head atraumatic, normocephalic, pupils equal and reactive to light, neck supple Cardiovascular: Normal rate, regular rhythm and normal heart sounds.  No murmur heard. Trace BLE edema. Pulmonary/Chest: Effort normal and breath sounds normal. No respiratory distress. Abdominal: Soft.  There is no tenderness. Psychiatric: Patient has a normal mood and affect. behavior is normal. Judgment and thought content normal.   Recent Results (from the past 2160 hours)  Chromogranin A     Status: Abnormal   Collection Time: 11/20/23  3:05 PM  Result Value Ref Range   Chromogranin A (ng/mL) 228.3 (H) 0.0 - 101.8 ng/mL    Comment: (NOTE) Chromogranin A performed by Thermofisher/BRAHMS KRYPTOR methodology Values obtained with different assay methods or kits cannot be used interchangeably. Performed At: [facility] [address] [place], [place] [id number] Jolene Schimke MD [id number]   Vitamin B12     Status: None   Collection Time: 11/20/23  3:05 PM  Result Value Ref Range   Vitamin B-12 267 180 - 914 pg/mL    Comment: (NOTE) This assay is not validated for testing neonatal or myeloproliferative syndrome specimens for Vitamin B12 levels. Performed at [facility], [address], [place], [place] [address]   CBC with Differential/Platelet     Status: Abnormal   Collection Time: 11/20/23  3:05 PM  Result Value Ref Range   WBC 8.4 4.0 - 10.5 K/uL   RBC 4.34 3.87 - 5.11 MIL/uL   Hemoglobin 11.9 (L) 12.0 - 15.0 g/dL   HCT 43.3 29.5 - 18.8 %   MCV 88.5 80.0 - 100.0 fL   MCH 27.4 26.0 - 34.0 pg   MCHC 31.0 30.0 - 36.0 g/dL   RDW 41.6 60.6 - 30.1 %   Platelets 286 150 - 400 K/uL   nRBC 0.0 0.0 -  0.2 %   Neutrophils Relative % 51 %   Neutro Abs 4.3 1.7 - 7.7 K/uL   Lymphocytes Relative 38 %   Lymphs Abs 3.2 0.7 - 4.0 K/uL   Monocytes Relative 8 %   Monocytes Absolute 0.6 0.1 - 1.0 K/uL   Eosinophils Relative 2 %   Eosinophils Absolute  0.2 0.0 - 0.5 K/uL   Basophils Relative 1 %   Basophils Absolute 0.0 0.0 - 0.1 K/uL   Immature Granulocytes 0 %   Abs Immature Granulocytes 0.02 0.00 - 0.07 K/uL    Comment: Performed at [facility], [address] [place], [place] [address]  POCT glycosylated hemoglobin (Hb A1C)     Status: Abnormal   Collection Time: 01/20/24 11:09 AM  Result Value Ref Range   Hemoglobin A1C 6.4 (A) 4.0 - 5.6 %   HbA1c POC (<> result, manual entry)     HbA1c, POC (prediabetic range)     HbA1c, POC (controlled diabetic range)      Diabetic Foot Exam:     PHQ2/9:    01/20/2024   11:04 AM 07/23/2023   10:55 AM 06/26/2023    2:51 PM 04/29/2023    1:39 PM 01/17/2023   10:51 AM  Depression screen PHQ 2/9  Decreased Interest 1 0  0 0  Down, Depressed, Hopeless 1 0 0 0 0  PHQ - 2 Score 2 0 0 0 0  Altered sleeping 0 0  0 0  Tired, decreased energy 0 0  0 0  Change in appetite 0 0  0 0  Feeling bad or failure about yourself  0 0  0 0  Trouble concentrating 0 0  0 0  Moving slowly or fidgety/restless 0 0  0 0  Suicidal thoughts 0 0  0 0  PHQ-9 Score 2 0  0 0  Difficult doing work/chores Somewhat difficult        phq 9 is positive  Fall Risk:    01/20/2024   11:04 AM 07/23/2023   10:55 AM 06/26/2023    2:51 PM 05/16/2023    2:55 PM 04/29/2023    1:39 PM  Fall Risk   Falls in the past year? 0 1 1 0 0  Number falls in past yr: 0 1 1  0  Injury with Fall? 0 1 0  0  Risk for fall due to : No Fall Risks History of fall(s)  No Fall Risks No Fall Risks  Follow up Falls prevention discussed;Education provided;Falls evaluation completed Falls prevention discussed;Education provided;Falls evaluation completed  Falls prevention discussed Falls prevention discussed     Assessment & Plan  1. Dyslipidemia associated with type 2 diabetes mellitus (HCC) (Primary)  - POCT glycosylated hemoglobin (Hb A1C) - empagliflozin (JARDIANCE) 25 MG TABS tablet; Take 1 tablet (25 mg total) by mouth daily.   Dispense: 90 tablet; Refill: 1 - metFORMIN (GLUCOPHAGE-XR) 500 MG 24 hr tablet; Take 1 tablet (500 mg total) by mouth daily with breakfast.  Dispense: 90 tablet; Refill: 1 - Lipid panel  2. Paroxysmal A-fib (HCC)  On eliquis, rate controlled   3. Small vessel disease (HCC)  On statin therapy   4. Stage 3a chronic kidney disease (HCC)  - Microalbumin / creatinine urine ratio - COMPLETE METABOLIC PANEL WITH GFR  5. Atherosclerosis of aorta (HCC)  - pravastatin (PRAVACHOL) 40 MG tablet; Take 1 tablet (40 mg total) by mouth daily.  Dispense: 90 tablet; Refill: 1  6. Malignant carcinoid tumor of small  intestine, unspecified location (HCC)  Stable symptoms   7. CHF (congestive heart failure), NYHA class I, chronic, diastolic (HCC)  - olmesartan (BENICAR) 40 MG tablet; Take 1 tablet (40 mg total) by mouth daily.  Dispense: 90 tablet; Refill: 1  8. Pulmonary hypertension, unspecified (HCC)  Sees cardiologist   9. Benign hypertension  - carvedilol (COREG) 25 MG tablet; Take 1 tablet (25 mg total) by mouth 2 (two) times daily.  Dispense: 180 tablet; Refill: 1  10. 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 Hypothyroidism, postablative  - TSH  12. DDD (degenerative disc disease), cervical  - pregabalin (LYRICA) 50 MG capsule; Take 1 capsule (50 mg total) by mouth every evening.  Dispense: 90 capsule; Refill: 1  13. Vitamin D deficiency  - Vitamin D, Ergocalciferol, (DRISDOL) 1.25 MG (50000 UNIT) CAPS capsule; Take 1 capsule (50,000 Units total) by mouth every 7 (seven) days.  Dispense: 12 capsule; Refill: 1 - VITAMIN D 25 Hydroxy (Vit-D Deficiency, Fractures)  14. Gait instability  She does not want to have home health at this time, since her sister has been very sick and has been sitting with her.

## 2024-01-21 ENCOUNTER — Other Ambulatory Visit: Payer: Self-pay | Admitting: Family Medicine

## 2024-01-21 ENCOUNTER — Inpatient Hospital Stay: Attending: Oncology

## 2024-01-21 ENCOUNTER — Encounter: Payer: Self-pay | Admitting: Family Medicine

## 2024-01-21 DIAGNOSIS — D519 Vitamin B12 deficiency anemia, unspecified: Secondary | ICD-10-CM | POA: Diagnosis present

## 2024-01-21 DIAGNOSIS — D508 Other iron deficiency anemias: Secondary | ICD-10-CM

## 2024-01-21 LAB — COMPLETE METABOLIC PANEL WITH GFR
AG Ratio: 1.6 (calc) (ref 1.0–2.5)
ALT: 7 U/L (ref 6–29)
AST: 10 U/L (ref 10–35)
Albumin: 4 g/dL (ref 3.6–5.1)
Alkaline phosphatase (APISO): 43 U/L (ref 37–153)
BUN/Creatinine Ratio: 13 (calc) (ref 6–22)
BUN: 13 mg/dL (ref 7–25)
CO2: 26 mmol/L (ref 20–32)
Calcium: 10.1 mg/dL (ref 8.6–10.4)
Chloride: 107 mmol/L (ref 98–110)
Creat: 1.04 mg/dL — ABNORMAL HIGH (ref 0.60–0.95)
Globulin: 2.5 g/dL (ref 1.9–3.7)
Glucose, Bld: 112 mg/dL — ABNORMAL HIGH (ref 65–99)
Potassium: 4.5 mmol/L (ref 3.5–5.3)
Sodium: 139 mmol/L (ref 135–146)
Total Bilirubin: 0.4 mg/dL (ref 0.2–1.2)
Total Protein: 6.5 g/dL (ref 6.1–8.1)

## 2024-01-21 LAB — MICROALBUMIN / CREATININE URINE RATIO
Creatinine, Urine: 94 mg/dL (ref 20–275)
Microalb Creat Ratio: 3 mg/g{creat} (ref <30)
Microalb, Ur: 0.3 mg/dL

## 2024-01-21 LAB — TSH: TSH: 1.53 m[IU]/L (ref 0.40–4.50)

## 2024-01-21 LAB — LIPID PANEL
Cholesterol: 133 mg/dL (ref <200)
HDL: 57 mg/dL (ref >=50)
LDL Cholesterol (Calc): 60 mg/dL
Non-HDL Cholesterol (Calc): 76 mg/dL (ref <130)
Total CHOL/HDL Ratio: 2.3 (calc) (ref <5.0)
Triglycerides: 77 mg/dL (ref <150)

## 2024-01-21 LAB — VITAMIN D 25 HYDROXY (VIT D DEFICIENCY, FRACTURES): Vit D, 25-Hydroxy: 109 ng/mL — ABNORMAL HIGH (ref 30–100)

## 2024-01-21 MED ORDER — CYANOCOBALAMIN 1000 MCG/ML IJ SOLN
1000.0000 ug | INTRAMUSCULAR | Status: DC
  Administered 2024-01-21: 1000 ug via INTRAMUSCULAR
  Filled 2024-01-21: qty 1

## 2024-02-11 ENCOUNTER — Other Ambulatory Visit: Payer: Self-pay | Admitting: Family Medicine

## 2024-02-11 NOTE — Telephone Encounter (Signed)
 Historical provider.

## 2024-02-19 ENCOUNTER — Inpatient Hospital Stay: Payer: Medicare HMO | Attending: Oncology

## 2024-02-19 DIAGNOSIS — E538 Deficiency of other specified B group vitamins: Secondary | ICD-10-CM | POA: Insufficient documentation

## 2024-02-19 DIAGNOSIS — D508 Other iron deficiency anemias: Secondary | ICD-10-CM

## 2024-02-19 MED ORDER — CYANOCOBALAMIN 1000 MCG/ML IJ SOLN
1000.0000 ug | INTRAMUSCULAR | Status: DC
  Administered 2024-02-19: 1000 ug via INTRAMUSCULAR
  Filled 2024-02-19: qty 1

## 2024-02-26 ENCOUNTER — Encounter: Payer: Self-pay | Admitting: Medical

## 2024-02-26 ENCOUNTER — Ambulatory Visit: Attending: Medical | Admitting: Medical

## 2024-02-26 VITALS — BP 120/54 | HR 67 | Ht 64.0 in | Wt 193.4 lb

## 2024-02-26 DIAGNOSIS — E782 Mixed hyperlipidemia: Secondary | ICD-10-CM

## 2024-02-26 DIAGNOSIS — I480 Paroxysmal atrial fibrillation: Secondary | ICD-10-CM

## 2024-02-26 DIAGNOSIS — I5032 Chronic diastolic (congestive) heart failure: Secondary | ICD-10-CM

## 2024-02-26 DIAGNOSIS — I10 Essential (primary) hypertension: Secondary | ICD-10-CM | POA: Diagnosis not present

## 2024-02-26 DIAGNOSIS — R079 Chest pain, unspecified: Secondary | ICD-10-CM

## 2024-02-26 DIAGNOSIS — I272 Pulmonary hypertension, unspecified: Secondary | ICD-10-CM

## 2024-02-26 MED ORDER — SPIRONOLACTONE 25 MG PO TABS: 25.0000 mg | ORAL_TABLET | Freq: Every day | ORAL | 3 refills | Status: AC

## 2024-02-26 MED ORDER — APIXABAN 5 MG PO TABS: 5.0000 mg | ORAL_TABLET | Freq: Two times a day (BID) | ORAL | 3 refills | Status: AC

## 2024-02-26 NOTE — Patient Instructions (Signed)
 Medication Instructions:  Your Physician recommend you continue on your current medication as directed.    *If you need a refill on your cardiac medications before your next appointment, please call your pharmacy*  Lab Work: No labs ordered today  If you have labs (blood work) drawn today and your tests are completely normal, you will receive your results only by: MyChart Message (if you have MyChart) OR A paper copy in the mail If you have any lab test that is abnormal or we need to change your treatment, we will call you to review the results.  Testing/Procedures:    Please report to Radiology at [facility] Main Entrance, medical mall, 30 mins prior to your test.  [address]  [place], [place]  How to Prepare for Your Cardiac PET/CT Stress Test:  Nothing to eat or drink, except water, 3 hours prior to arrival time.  NO caffeine/decaffeinated products, or chocolate 12 hours prior to arrival. (Please note decaffeinated beverages (teas/coffees) still contain caffeine).  If you have caffeine within 12 hours prior, the test will need to be rescheduled.  Medication instructions: Do not take erectile dysfunction medications for 72 hours prior to test (sildenafil, tadalafil) Do not take nitrates (isosorbide mononitrate, Ranexa) the day before or day of test Do not take tamsulosin the day before or morning of test Hold theophylline containing medications for 12 hours. Hold Dipyridamole 48 hours prior to the test.  Diabetic Preparation: If able to eat breakfast prior to 3 hour fasting, you may take all medications, including your insulin . Do not worry if you miss your breakfast dose of insulin  - start at your next meal. If you do not eat prior to 3 hour fast-Hold all diabetes (oral and insulin ) medications. Patients who wear a continuous glucose monitor MUST remove the device prior to scanning.  You may take your remaining medications with water.  NO  perfume, cologne or lotion on chest or abdomen area. FEMALES - Please avoid wearing dresses to this appointment.  Total time is 1 to 2 hours; you may want to bring reading material for the waiting time.  IF YOU THINK YOU MAY BE PREGNANT, OR ARE NURSING PLEASE INFORM THE TECHNOLOGIST.  In preparation for your appointment, medication and supplies will be purchased.  Appointment availability is limited, so if you need to cancel or reschedule, please call the Radiology Department Scheduler at [phone] 24 hours in advance to avoid a cancellation fee of $100.00  What to Expect When you Arrive:  Once you arrive and check in for your appointment, you will be taken to a preparation room within the Radiology Department.  A technologist or Nurse will obtain your medical history, verify that you are correctly prepped for the exam, and explain the procedure.  Afterwards, an IV will be started in your arm and electrodes will be placed on your skin for EKG monitoring during the stress portion of the exam. Then you will be escorted to the PET/CT scanner.  There, staff will get you positioned on the scanner and obtain a blood pressure and EKG.  During the exam, you will continue to be connected to the EKG and blood pressure machines.  A small, safe amount of a radioactive tracer will be injected in your IV to obtain a series of pictures of your heart along with an injection of a stress agent.    After your Exam:  It is recommended that you eat a meal and drink a caffeinated beverage to  counter act any effects of the stress agent.  Drink plenty of fluids for the remainder of the day and urinate frequently for the first couple of hours after the exam.  Your doctor will inform you of your test results within 7-10 business days.  For more information and frequently asked questions, please visit our website: [email or web address]  For questions about your test or how to prepare for your test, please  call: Cardiac Imaging Nurse Navigators Office: [phone]   Follow-Up: At [facility], you and your health needs are our priority.  As part of our continuing mission to provide you with exceptional heart care, our providers are all part of one team.  This team includes your primary Cardiologist (physician) and Advanced Practice Providers or APPs (Physician Assistants and Nurse Practitioners) who all work together to provide you with the care you need, when you need it.  Your next appointment:   4 - 6 week(s)  Provider:   Antionette Kirks, MD or Cadence Gennaro Khat, PA-C

## 2024-02-26 NOTE — Progress Notes (Signed)
 Cardiology Office Note:  .   Date:  02/26/2024  ID:  Susan Bowen, DOB 06/15/1932, MRN [id number] PCP: Sowles, Krichna, MD  [facility] Providers Cardiologist:  Susan Kirks, MD {    History of Present Illness: .   Susan Bowen is a 89 y.o. female with a hx of paroxysmal Afib, chronic diastolic heart failure, pulmonary HTN, HTN, CKD stage 3, DM2, and HLD who presents for 6 month follow-up.   She was hospitalized in May 2019 with chest pain in the setting of Afib RVR. The dose of coreg  was increased and she was started on Eliquis  for anticoagulation. Echo showed normal LVSF, G1DD. Lexiscan  Myoview  in June 2019 showed no evidence of ischemia with normal EF.    In 2020, she was diagnosed with carcinoid tumor with metastasis. This was an incidental finding on CT of the abdomen which was done for renal stone. This is monitor by oncology.    Echo in August 2022 showed normal LVSF, G1DD, moderate to severe pulmonary HTN.    She had an ER visit in 09/2022 for Afib RVR. She improved with IV dilt. She was discharged home on diltiazem  extended release 120mg  once daily.   Patient was last seen in November 2024 and was overall feeling well.  She was in normal sinus rhythm.  Today, the patient reports last month she was having SOB and chest tightness. She felt heart racing. She had the symptoms when she would put her eye drops in. She has occasional lightheadedness and dizziness. She denies lower leg edema. She denies bleeding issues on the Eliquis . She does not need a cane or walker.    Studies Reviewed: Susan Bowen   EKG Interpretation Date/Time:  Thursday February 26 2024 10:09:08 EDT Ventricular Rate:  67 PR Interval:  176 QRS Duration:  84 QT Interval:  364 QTC Calculation: 384 R Axis:   -23  Text Interpretation: Normal sinus rhythm with sinus arrhythmia Low voltage QRS Nonspecific T wave abnormality When compared with ECG of 19-Sep-2023 13:28, No significant change was found Confirmed by  Susan Bowen, Susan Bowen ([id number]) on 02/26/2024 10:14:22 AM    Echo 05/2022 1. Left ventricular ejection fraction, by estimation, is 60 to 65%. The  left ventricle has normal function. The left ventricle has no regional  wall motion abnormalities. Left ventricular diastolic parameters are  consistent with Grade I diastolic  dysfunction (impaired relaxation).   2. Right ventricular systolic function is normal. The right ventricular  size is normal. There is moderately elevated pulmonary artery systolic  pressure. The estimated right ventricular systolic pressure is 48.8 mmHg.   3. Left atrial size was mildly dilated.   4. The mitral valve is normal in structure. Mild mitral valve  regurgitation. No evidence of mitral stenosis.   5. Tricuspid valve regurgitation is mild to moderate.   6. The aortic valve is normal in structure. Aortic valve regurgitation is  not visualized. Aortic valve sclerosis is present, with no evidence of  aortic valve stenosis. Aortic valve area, by VTI measures 1.63 cm. Aortic  valve mean gradient measures 7.0  mmHg.   7. The inferior vena cava is normal in size with greater than 50%  respiratory variability, suggesting right atrial pressure of 3 mmHg.    Heart monitor 11/2021 Patch Wear Time:  14 days and 0 hours (2022-12-16T15:20:22-0500 to 2022-12-30T15:20:26-0500)   Patient had a min HR of 55 bpm, max HR of 203 bpm, and avg HR of 74 bpm. Predominant underlying rhythm was Sinus  Rhythm.  23 Supraventricular Tachycardia runs occurred, the run with the fastest interval lasting 8 beats with a max rate of 203 bpm, the longest lasting 20 beats with an avg rate of 159 bpm. Supraventricular Tachycardia was detected within +/- 45 seconds of symptomatic patient event(s). Occasional PACs and rare PVCs.  Myoview  lexiscan  2019 Narrative & Impression  Pharmacological myocardial perfusion imaging study with no significant  Ischemia Small region of mild fixed perfusion defect in the  apical and inferoapical region, likely secondary to attenuation artifact  Normal wall motion, EF estimated at 78% No EKG changes concerning for ischemia at peak stress or in recovery. Low risk scan       Physical Exam:   VS:  BP (!) 120/54   Pulse 67   Ht 5\' 4"  (1.626 m)   Wt 193 lb 6.4 oz (87.7 kg)   LMP  (LMP Unknown)   SpO2 96%   BMI 33.20 kg/m    Wt Readings from Last 3 Encounters:  02/26/24 193 lb 6.4 oz (87.7 kg)  01/20/24 191 lb 14.4 oz (87 kg)  09/19/23 187 lb 12.8 oz (85.2 kg)    GEN: Well nourished, well developed in no acute distress NECK: No JVD; No carotid bruits CARDIAC: RRR, no murmurs, rubs, gallops RESPIRATORY:  Clear to auscultation without rales, wheezing or rhonchi  ABDOMEN: Soft, non-tender, non-distended EXTREMITIES:  No edema; No deformity   ASSESSMENT AND PLAN: .    Chest pain The patient reports lat month she was experiencing chest tightness and SOB when putting in her eye drops in. I will order a Cardiac PET stress test. MPI in 2019 was unremarkable.   Paroxysmal Afib The patient is in NSR today. She denies bleeding issues with Eliquis . She did have some palpitations last month, but these resolved.  HTN BP is normal today. Continue Coreg  25mg  BID, Cardizem  120mg  daily, Benicar  40mg  daily and Spiro 25mg  daily.   HLD LDL 60. Continue Pravastatin .   Chronic diastolic heart failure Moderate to severe pulmonary HTN The patient is euvolemic on exam. Echo in 2023 showed LVEF 60-65%, G1DD, moderately elevated pulmonary artery systolic pressure, mild MR, mild to mod TR.  Continue lasix  20mg  daily.     Informed Consent   Shared Decision Making/Informed Consent The risks [chest pain, shortness of breath, cardiac arrhythmias, dizziness, blood pressure fluctuations, myocardial infarction, stroke/transient ischemic attack, nausea, vomiting, allergic reaction, radiation exposure, metallic taste sensation and life-threatening complications (estimated to be 1  in 10,000)], benefits (risk stratification, diagnosing coronary artery disease, treatment guidance) and alternatives of a cardiac PET stress test were discussed in detail with Ms. Hecker and she agrees to proceed.     Dispo: Follow-up in 6 weeks  Signed, Diamantina Edinger Rebekah Canada, PA-C

## 2024-03-03 ENCOUNTER — Other Ambulatory Visit: Payer: Self-pay | Admitting: Family Medicine

## 2024-03-03 DIAGNOSIS — E1169 Type 2 diabetes mellitus with other specified complication: Secondary | ICD-10-CM

## 2024-03-11 ENCOUNTER — Other Ambulatory Visit: Payer: Self-pay | Admitting: Family Medicine

## 2024-03-11 DIAGNOSIS — I5032 Chronic diastolic (congestive) heart failure: Secondary | ICD-10-CM

## 2024-03-11 DIAGNOSIS — E890 Postprocedural hypothyroidism: Secondary | ICD-10-CM

## 2024-03-19 ENCOUNTER — Inpatient Hospital Stay: Payer: Medicare HMO | Attending: Oncology

## 2024-03-19 DIAGNOSIS — D508 Other iron deficiency anemias: Secondary | ICD-10-CM

## 2024-03-19 DIAGNOSIS — E538 Deficiency of other specified B group vitamins: Secondary | ICD-10-CM | POA: Diagnosis present

## 2024-03-19 MED ORDER — CYANOCOBALAMIN 1000 MCG/ML IJ SOLN
1000.0000 ug | INTRAMUSCULAR | Status: DC
  Administered 2024-03-19: 1000 ug via INTRAMUSCULAR
  Filled 2024-03-19: qty 1

## 2024-03-22 ENCOUNTER — Telehealth: Payer: Self-pay

## 2024-03-22 ENCOUNTER — Other Ambulatory Visit: Payer: Self-pay | Admitting: Family Medicine

## 2024-03-22 DIAGNOSIS — E1169 Type 2 diabetes mellitus with other specified complication: Secondary | ICD-10-CM

## 2024-03-22 NOTE — Telephone Encounter (Unsigned)
 Copied from CRM [id number]. Topic: Clinical - Medication Refill >> Mar 22, 2024  9:02 AM [name] wrote: Medication: metFORMIN  (GLUCOPHAGE -XR) 500 MG  Has the patient contacted their pharmacy? Yes (Agent: If no, request that the patient contact the pharmacy for the refill. If patient does not wish to contact the pharmacy document the reason why and proceed with request.) (Agent: If yes, when and what did the pharmacy advise?)  This is the patient's preferred pharmacy:  [id number] [place], [place] - [address] [place] [address] Phone: [phone] Fax: [phone]  Is this the correct pharmacy for this prescription? Yes If no, delete pharmacy and type the correct one.   Has the prescription been filled recently? No  Is the patient out of the medication? No  Has the patient been seen for an appointment in the last year OR does the patient have an upcoming appointment? Yes  Can we respond through MyChart? Yes  Agent: Please be advised that Rx refills may take up to 3 business days. We ask that you follow-up with your pharmacy.

## 2024-03-22 NOTE — Telephone Encounter (Signed)
 Copied from CRM [id number]. Topic: Clinical - Prescription Issue >> Mar 22, 2024  9:07 AM [name] wrote: Reason for CRM: Patient would like hydrALAZINE  (APRESOLINE ) 10 MG tablet take off her meds list because she keeps getting calls about refilling this and see no longer takes this medication. I did inform her to reach out to her pharmacy and let them know as well since the calls are coming from them.

## 2024-03-22 NOTE — Telephone Encounter (Signed)
 Discontinued medication

## 2024-03-22 NOTE — Telephone Encounter (Signed)
 It says PRN on med instructions, ok to d/c?

## 2024-03-22 NOTE — Addendum Note (Signed)
 Addended by: RENTERIA-GARCIA, Adilee Lemme on: 03/22/2024 02:11 PM   Modules accepted: Orders

## 2024-03-24 NOTE — Telephone Encounter (Signed)
 Requested medication (s) are due for refill today: yes  Requested medication (s) are on the active medication list: yes  Last refill:  01/20/24 #90 1 refills  Future visit scheduled: yes in 4 months  Notes to clinic:  protocol failed last labs abnormal 01/20/24. Do you want to refill Rx?     Requested Prescriptions  Pending Prescriptions Disp Refills   metFORMIN  (GLUCOPHAGE -XR) 500 MG 24 hr tablet 90 tablet 1    Sig: Take 1 tablet (500 mg total) by mouth daily with breakfast.     Endocrinology:  Diabetes - Biguanides Failed - 03/24/2024 10:34 AM      Failed - Cr in normal range and within 360 days    Creat  Date Value Ref Range Status  01/20/2024 1.04 (H) 0.60 - 0.95 mg/dL Final   Creatinine, Urine  Date Value Ref Range Status  01/20/2024 94 20 - 275 mg/dL Final         Failed - eGFR in normal range and within 360 days    GFR, Est African American  Date Value Ref Range Status  03/22/2021 46 (L) > OR = 60 mL/min/1.59m2 Final   GFR, Est Non African American  Date Value Ref Range Status  03/22/2021 39 (L) > OR = 60 mL/min/1.70m2 Final   GFR, Estimated  Date Value Ref Range Status  05/19/2023 55 (L) >60 mL/min Final    Comment:    (NOTE) Calculated using the CKD-EPI Creatinine Equation (2021)    eGFR  Date Value Ref Range Status  07/08/2023 57 (L) >59 mL/min/1.73 Final         Passed - HBA1C is between 0 and 7.9 and within 180 days    Hemoglobin A1C  Date Value Ref Range Status  01/20/2024 6.4 (A) 4.0 - 5.6 % Final   HbA1c, POC (controlled diabetic range)  Date Value Ref Range Status  06/26/2018 7.5 (A) 0.0 - 7.0 % Final   Hgb A1c MFr Bld  Date Value Ref Range Status  01/16/2022 6.9 (H) <5.7 % of total Hgb Final    Comment:    For someone without known diabetes, a hemoglobin A1c value of 6.5% or greater indicates that they may have  diabetes and this should be confirmed with a follow-up  test. . For someone with known diabetes, a value <7% indicates   that their diabetes is well controlled and a value  greater than or equal to 7% indicates suboptimal  control. A1c targets should be individualized based on  duration of diabetes, age, comorbid conditions, and  other considerations. . Currently, no consensus exists regarding use of hemoglobin A1c for diagnosis of diabetes for children. .          Passed - B12 Level in normal range and within 720 days    Vitamin B-12  Date Value Ref Range Status  11/20/2023 267 180 - 914 pg/mL Final    Comment:    (NOTE) This assay is not validated for testing neonatal or myeloproliferative syndrome specimens for Vitamin B12 levels. Performed at [facility], [address], [place], [place] [address]          Passed - Valid encounter within last 6 months    Recent Outpatient Visits           2 months ago Dyslipidemia associated with type 2 diabetes mellitus [facility])   [place] [facility] Sowles, Krichna, MD       Future Appointments  In 2 weeks Furth, [employer], PA-C [facility] [place]   In 4 months Arleen Lacer, MD [facility]            Passed - CBC within normal limits and completed in the last 12 months    WBC  Date Value Ref Range Status  11/20/2023 8.4 4.0 - 10.5 K/uL Final   RBC  Date Value Ref Range Status  11/20/2023 4.34 3.87 - 5.11 MIL/uL Final   Hemoglobin  Date Value Ref Range Status  11/20/2023 11.9 (L) 12.0 - 15.0 g/dL Final  82/95/6213 08.6 11.1 - 15.9 g/dL Final   HCT  Date Value Ref Range Status  11/20/2023 38.4 36.0 - 46.0 % Final   Hematocrit  Date Value Ref Range Status  12/19/2016 37.9 34.0 - 46.6 % Final   MCHC  Date Value Ref Range Status  11/20/2023 31.0 30.0 - 36.0 g/dL Final   [facility]  Date Value Ref Range Status  11/20/2023 27.4 26.0 - 34.0 pg Final   MCV  Date Value Ref Range Status  11/20/2023 88.5 80.0 - 100.0 fL Final  12/19/2016 86 79 - 97 fL  Final  03/28/2014 81 80 - 100 fL Final   No results found for: "PLTCOUNTKUC", "LABPLAT", "POCPLA" RDW  Date Value Ref Range Status  11/20/2023 15.0 11.5 - 15.5 % Final  12/19/2016 16.6 (H) 12.3 - 15.4 % Final  03/28/2014 17.0 (H) 11.5 - 14.5 % Final

## 2024-03-26 ENCOUNTER — Other Ambulatory Visit: Payer: Self-pay

## 2024-03-26 DIAGNOSIS — E1169 Type 2 diabetes mellitus with other specified complication: Secondary | ICD-10-CM

## 2024-03-26 MED ORDER — METFORMIN HCL ER 500 MG PO TB24: 500.0000 mg | ORAL_TABLET | Freq: Every day | ORAL | 1 refills | Status: DC

## 2024-04-07 ENCOUNTER — Ambulatory Visit: Admitting: Medical

## 2024-04-07 NOTE — Progress Notes (Deleted)
  Cardiology Office Note   Date:  04/07/2024  ID:  [name], DOB 05/11/1932, MRN [id number] PCP: Sowles, Krichna, MD  [facility] Providers Cardiologist:  Antionette Kirks, MD { Click to update primary MD,subspecialty MD or APP then REFRESH:1}    History of Present Illness Susan Bowen is a 89 y.o. female with a hx of paroxysmal Afib, chronic diastolic heart failure, pulmonary HTN, HTN, CKD stage 3, DM2, and HLD who presents for 6 month follow-up.   She was hospitalized in May 2019 with chest pain in the setting of Afib RVR. The dose of coreg  was increased and she was started on Eliquis  for anticoagulation. Echo showed normal LVSF, G1DD. Lexiscan  Myoview  in June 2019 showed no evidence of ischemia with normal EF.    In 2020, she was diagnosed with carcinoid tumor with metastasis. This was an incidental finding on CT of the abdomen which was done for renal stone. This is monitor by oncology.    Echo in August 2022 showed normal LVSF, G1DD, moderate to severe pulmonary HTN.    She had an ER visit in 09/2022 for Afib RVR. She improved with IV dilt. She was discharged home on diltiazem  extended release 120mg  once daily.    The patient was last seen 02/26/24 reporting SOB and chest tightness. Cardiac PET stress test.    ROS: ***  Studies Reviewed      *** Risk Assessment/Calculations {Does this patient have ATRIAL FIBRILLATION?:[phone]} No BP recorded.  {Refresh Note OR Click here to enter BP  :1}***       Physical Exam VS:  LMP  (LMP Unknown)    Wt Readings from Last 3 Encounters:  02/26/24 193 lb 6.4 oz (87.7 kg)  01/20/24 191 lb 14.4 oz (87 kg)  09/19/23 187 lb 12.8 oz (85.2 kg)    GEN: Well nourished, well developed in no acute distress NECK: No JVD; No carotid bruits CARDIAC: ***RRR, no murmurs, rubs, gallops RESPIRATORY:  Clear to auscultation without rales, wheezing or rhonchi  ABDOMEN: Soft, non-tender, non-distended EXTREMITIES:  No edema; No deformity    ASSESSMENT AND PLAN ***    {Are you ordering a CV Procedure (e.g. stress test, cath, DCCV, TEE, etc)?   Press F2        :[id number]}  Dispo: ***  Signed, Susan Ventress Rebekah Canada, PA-C

## 2024-04-15 ENCOUNTER — Other Ambulatory Visit: Payer: Self-pay | Admitting: Family Medicine

## 2024-04-15 DIAGNOSIS — E1169 Type 2 diabetes mellitus with other specified complication: Secondary | ICD-10-CM

## 2024-04-19 ENCOUNTER — Inpatient Hospital Stay: Payer: Medicare HMO | Attending: Oncology

## 2024-04-19 DIAGNOSIS — D508 Other iron deficiency anemias: Secondary | ICD-10-CM

## 2024-04-19 DIAGNOSIS — E538 Deficiency of other specified B group vitamins: Secondary | ICD-10-CM | POA: Insufficient documentation

## 2024-04-19 DIAGNOSIS — Z79899 Other long term (current) drug therapy: Secondary | ICD-10-CM | POA: Insufficient documentation

## 2024-04-19 MED ORDER — CYANOCOBALAMIN 1000 MCG/ML IJ SOLN
1000.0000 ug | INTRAMUSCULAR | Status: DC
  Administered 2024-04-19: 1000 ug via INTRAMUSCULAR
  Filled 2024-04-19: qty 1

## 2024-04-21 ENCOUNTER — Encounter (HOSPITAL_COMMUNITY): Payer: Self-pay

## 2024-04-22 ENCOUNTER — Ambulatory Visit: Payer: Self-pay | Admitting: Medical

## 2024-04-22 ENCOUNTER — Ambulatory Visit
Admission: RE | Admit: 2024-04-22 | Discharge: 2024-04-22 | Disposition: A | Discharge status: Home / Self Care | Source: Ambulatory Visit | Attending: Medical | Admitting: Medical

## 2024-04-22 DIAGNOSIS — R079 Chest pain, unspecified: Secondary | ICD-10-CM | POA: Diagnosis not present

## 2024-04-22 DIAGNOSIS — R918 Other nonspecific abnormal finding of lung field: Secondary | ICD-10-CM | POA: Insufficient documentation

## 2024-04-22 DIAGNOSIS — I700 Atherosclerosis of aorta: Secondary | ICD-10-CM | POA: Insufficient documentation

## 2024-04-22 LAB — NM PET CT CARDIAC PERFUSION MULTI W/ABSOLUTE BLOODFLOW
LV dias vol: 87 mL (ref 46–106)
LV sys vol: 27 mL (ref 3.8–5.2)
MBFR: 1.76
Nuc Rest EF: 63 %
Nuc Stress EF: 69 %
Peak HR: 91 {beats}/min
Rest HR: 58 {beats}/min
Rest MBF: 1.14 ml/g/min
Rest Nuclear Isotope Dose: 63 mCi
SRS: 0
SSS: 0
ST Depression (mm): 0 mm
Stress MBF: 2.01 ml/g/min
Stress Nuclear Isotope Dose: 69 mCi
TID: 1

## 2024-04-22 MED ORDER — RUBIDIUM RB82 GENERATOR (RUBYFILL)
25.0000 | PACK | Freq: Once | INTRAVENOUS | Status: AC
  Administered 2024-04-22: 22.65 via INTRAVENOUS

## 2024-04-22 MED ORDER — REGADENOSON 0.4 MG/5ML IV SOLN
0.4000 mg | Freq: Once | INTRAVENOUS | Status: AC
  Administered 2024-04-22: 0.4 mg via INTRAVENOUS
  Filled 2024-04-22: qty 5

## 2024-04-22 MED ORDER — RUBIDIUM RB82 GENERATOR (RUBYFILL)
25.0000 | PACK | Freq: Once | INTRAVENOUS | Status: AC
Start: 2024-04-22 — End: 2024-04-22
  Administered 2024-04-22: 22.66 via INTRAVENOUS

## 2024-04-22 MED ORDER — REGADENOSON 0.4 MG/5ML IV SOLN
INTRAVENOUS | Status: AC
  Filled 2024-04-22: qty 5

## 2024-04-22 NOTE — Progress Notes (Signed)
 Patient presents for a cardiac PET stress test and tolerated procedure without incident. Patient maintained acceptable vital signs throughout the test and was offered caffeine after test.  Patient ambulated out of department with a steady gait.

## 2024-04-26 ENCOUNTER — Ambulatory Visit: Attending: Medical | Admitting: Medical

## 2024-04-26 ENCOUNTER — Encounter: Payer: Self-pay | Admitting: Medical

## 2024-04-26 VITALS — BP 152/82 | HR 67 | Ht 64.0 in | Wt 194.4 lb

## 2024-04-26 DIAGNOSIS — I480 Paroxysmal atrial fibrillation: Secondary | ICD-10-CM

## 2024-04-26 DIAGNOSIS — I5032 Chronic diastolic (congestive) heart failure: Secondary | ICD-10-CM | POA: Diagnosis not present

## 2024-04-26 DIAGNOSIS — R079 Chest pain, unspecified: Secondary | ICD-10-CM

## 2024-04-26 DIAGNOSIS — E782 Mixed hyperlipidemia: Secondary | ICD-10-CM | POA: Diagnosis not present

## 2024-04-26 DIAGNOSIS — I10 Essential (primary) hypertension: Secondary | ICD-10-CM | POA: Diagnosis not present

## 2024-04-26 DIAGNOSIS — I272 Pulmonary hypertension, unspecified: Secondary | ICD-10-CM

## 2024-04-26 NOTE — Progress Notes (Signed)
 Cardiology Office Note   Date:  04/26/2024  ID:  Susan Bowen, DOB 1931/12/15, MRN [id number] PCP: Sowles, Krichna, MD  [facility] Providers Cardiologist:  Deatrice Cage, MD   History of Present Illness Susan Bowen is a 89 y.o. female with a hx of paroxysmal Afib, chronic diastolic heart failure, pulmonary HTN, HTN, CKD stage 3, DM2, and HLD who presents for follow-up for stress test.   She was hospitalized in May 2019 with chest pain in the setting of Afib RVR. The dose of coreg  was increased and she was started on Eliquis  for anticoagulation. Echo showed normal LVSF, G1DD. Lexiscan  Myoview  in June 2019 showed no evidence of ischemia with normal EF.    In 2020, she was diagnosed with carcinoid tumor with metastasis. This was an incidental finding on CT of the abdomen which was done for renal stone. This is monitor by oncology.    Echo in August 2022 showed normal LVSF, G1DD, moderate to severe pulmonary HTN.    She had an ER visit in 09/2022 for Afib RVR. She improved with IV dilt. She was discharged home on diltiazem  extended release 120mg  once daily.  At follow-up patient was in normal sinus rhythm.  Patient was last seen 02/26/2024 reporting shortness of breath and chest tightness.  Cardiac PET stress was normal and low risk.  Mild coronary calcifications present.  Today, the patient denies chest pain or SOB. She has occasional palpitations that are tolerable. They last a few minutes. She denies lower leg edema, orthopnea or pnd. BP is high, but this is abnormal.   Studies Reviewed EKG Interpretation Date/Time:  Monday April 26 2024 11:08:26 EDT Ventricular Rate:  61 PR Interval:  154 QRS Duration:  88 QT Interval:  384 QTC Calculation: 386 R Axis:   8  Text Interpretation: Normal sinus rhythm with sinus arrhythmia Normal ECG When compared with ECG of 26-Feb-2024 10:09, Nonspecific T wave abnormality no longer evident in Lateral leads Confirmed by Franchester, Bobie Kistler  [id number]) on 04/26/2024 11:25:08 AM    Cardiac PET stress test 04/2024   LV perfusion is normal.   Rest left ventricular function is normal. Rest EF: 63%. Stress left ventricular function is normal. Stress EF: 69%. End diastolic cavity size is normal.   Myocardial blood flow was computed to be 1.27ml/g/min at rest and 2.01ml/g/min at stress. Global myocardial blood flow reserve was 1.76 and was mildly abnormal.  This is nonspecific but can be seen with microvascular dysfunction and balanced ischemia.   Coronary calcium was present on the attenuation correction CT images. Mild coronary calcifications were present.   The study is low risk.   Electronically signed by: Lonni Hanson, MD.  Echo 05/2022  1. Left ventricular ejection fraction, by estimation, is 60 to 65%. The  left ventricle has normal function. The left ventricle has no regional  wall motion abnormalities. Left ventricular diastolic parameters are  consistent with Grade I diastolic  dysfunction (impaired relaxation).   2. Right ventricular systolic function is normal. The right ventricular  size is normal. There is moderately elevated pulmonary artery systolic  pressure. The estimated right ventricular systolic pressure is 48.8 mmHg.   3. Left atrial size was mildly dilated.   4. The mitral valve is normal in structure. Mild mitral valve  regurgitation. No evidence of mitral stenosis.   5. Tricuspid valve regurgitation is mild to moderate.   6. The aortic valve is normal in structure. Aortic valve regurgitation is  not visualized. Aortic valve  sclerosis is present, with no evidence of  aortic valve stenosis. Aortic valve area, by VTI measures 1.63 cm. Aortic  valve mean gradient measures 7.0  mmHg.   7. The inferior vena cava is normal in size with greater than 50%  respiratory variability, suggesting right atrial pressure of 3 mmHg.   Heart monitor 11/2021 Patch Wear Time:  14 days and 0 hours (2022-12-16T15:20:22-0500 to  2022-12-30T15:20:26-0500)   Patient had a min HR of 55 bpm, max HR of 203 bpm, and avg HR of 74 bpm. Predominant underlying rhythm was Sinus Rhythm.  23 Supraventricular Tachycardia runs occurred, the run with the fastest interval lasting 8 beats with a max rate of 203 bpm, the longest lasting 20 beats with an avg rate of 159 bpm. Supraventricular Tachycardia was detected within +/- 45 seconds of symptomatic patient event(s). Occasional PACs and rare PVCs.     Physical Exam VS:  BP (!) 152/82 (BP Location: Left Arm, Patient Position: Sitting, Cuff Size: Normal)   Pulse 67   Ht 5' 4 (1.626 m)   Wt 194 lb 6.4 oz (88.2 kg)   LMP  (LMP Unknown)   SpO2 98%   BMI 33.37 kg/m        Wt Readings from Last 3 Encounters:  04/26/24 194 lb 6.4 oz (88.2 kg)  02/26/24 193 lb 6.4 oz (87.7 kg)  01/20/24 191 lb 14.4 oz (87 kg)    GEN: Well nourished, well developed in no acute distress NECK: No JVD; No carotid bruits CARDIAC: RRR, no murmurs, rubs, gallops RESPIRATORY:  Clear to auscultation without rales, wheezing or rhonchi  ABDOMEN: Soft, non-tender, non-distended EXTREMITIES:  No edema; No deformity   ASSESSMENT AND PLAN  Chest pain and SOB Cardiac PET stress test was normal and low risk with mild coronary calcifications. The patient denies further chest pain or SOB. No further work-up at this time. No ASA given Eliquis . Continue statin and BB therapy.    Paroxysmal Afib The patient is in NSR today. She reports occasional palpitations that are tolerable. Can increase Diltiazem  in the future for palpitations and BP if needed. Continue Coreg  25mg  BID, Diltiazem  120mg  daily and Eliquis  5mg BID.   HTN BP high, but this is abnormal. Can increase Dilt in the future if necessary. For now, continue Coreg  25mg  BID, Cardizem  120mg  daily, Benicar  40mg  daily and spironolactone  25mg  daily.   HLD LDL 60. Continue Pravastatin .   Chronic diastolic heart failure Moderate to severe pulmonary HTN The  patient is euvolemic on exam. Echo in 2023 showed LVEF 60-65%, G1DD, moderately elevated pulmonary artery systolic pressure, mild MR, mild tom omd TR. Continue lasix  20mg  daily.      Dispo: follow-up in 4 months  Signed, Sterling Ucci VEAR Fishman, PA-C

## 2024-04-26 NOTE — Patient Instructions (Signed)
 Medication Instructions:  Your physician recommends that you continue on your current medications as directed. Please refer to the Current Medication list given to you today.    *If you need a refill on your cardiac medications before your next appointment, please call your pharmacy*  Lab Work: No labs ordered today    Testing/Procedures: No test ordered today   Follow-Up: At [facility], you and your health needs are our priority.  As part of our continuing mission to provide you with exceptional heart care, our providers are all part of one team.  This team includes your primary Cardiologist (physician) and Advanced Practice Providers or APPs (Physician Assistants and Nurse Practitioners) who all work together to provide you with the care you need, when you need it.  Your next appointment:   4 month(s)  Provider:   You may see Deatrice Cage, MD or one of the following Advanced Practice Providers on your designated Care Team:    Cadence Miller, PA-C

## 2024-05-10 ENCOUNTER — Other Ambulatory Visit: Payer: Self-pay

## 2024-05-10 DIAGNOSIS — E538 Deficiency of other specified B group vitamins: Secondary | ICD-10-CM

## 2024-05-10 DIAGNOSIS — D508 Other iron deficiency anemias: Secondary | ICD-10-CM

## 2024-05-11 ENCOUNTER — Inpatient Hospital Stay: Attending: Oncology

## 2024-05-11 ENCOUNTER — Inpatient Hospital Stay (HOSPITAL_BASED_OUTPATIENT_CLINIC_OR_DEPARTMENT_OTHER): Admitting: Oncology

## 2024-05-11 ENCOUNTER — Encounter: Payer: Self-pay | Admitting: Oncology

## 2024-05-11 ENCOUNTER — Inpatient Hospital Stay

## 2024-05-11 VITALS — BP 141/78 | HR 64 | Temp 98.6°F | Resp 20 | Wt 189.4 lb

## 2024-05-11 DIAGNOSIS — Z7985 Long-term (current) use of injectable non-insulin antidiabetic drugs: Secondary | ICD-10-CM | POA: Diagnosis not present

## 2024-05-11 DIAGNOSIS — E1122 Type 2 diabetes mellitus with diabetic chronic kidney disease: Secondary | ICD-10-CM | POA: Diagnosis not present

## 2024-05-11 DIAGNOSIS — Z79899 Other long term (current) drug therapy: Secondary | ICD-10-CM | POA: Diagnosis not present

## 2024-05-11 DIAGNOSIS — I129 Hypertensive chronic kidney disease with stage 1 through stage 4 chronic kidney disease, or unspecified chronic kidney disease: Secondary | ICD-10-CM | POA: Insufficient documentation

## 2024-05-11 DIAGNOSIS — I480 Paroxysmal atrial fibrillation: Secondary | ICD-10-CM | POA: Insufficient documentation

## 2024-05-11 DIAGNOSIS — E039 Hypothyroidism, unspecified: Secondary | ICD-10-CM | POA: Diagnosis not present

## 2024-05-11 DIAGNOSIS — Z8673 Personal history of transient ischemic attack (TIA), and cerebral infarction without residual deficits: Secondary | ICD-10-CM | POA: Diagnosis not present

## 2024-05-11 DIAGNOSIS — Z79621 Long term (current) use of calcineurin inhibitor: Secondary | ICD-10-CM | POA: Insufficient documentation

## 2024-05-11 DIAGNOSIS — Z7901 Long term (current) use of anticoagulants: Secondary | ICD-10-CM | POA: Insufficient documentation

## 2024-05-11 DIAGNOSIS — E538 Deficiency of other specified B group vitamins: Secondary | ICD-10-CM | POA: Insufficient documentation

## 2024-05-11 DIAGNOSIS — I10 Essential (primary) hypertension: Secondary | ICD-10-CM | POA: Diagnosis not present

## 2024-05-11 DIAGNOSIS — Z7984 Long term (current) use of oral hypoglycemic drugs: Secondary | ICD-10-CM | POA: Diagnosis not present

## 2024-05-11 DIAGNOSIS — R599 Enlarged lymph nodes, unspecified: Secondary | ICD-10-CM | POA: Insufficient documentation

## 2024-05-11 DIAGNOSIS — M159 Polyosteoarthritis, unspecified: Secondary | ICD-10-CM | POA: Diagnosis not present

## 2024-05-11 DIAGNOSIS — D509 Iron deficiency anemia, unspecified: Secondary | ICD-10-CM | POA: Diagnosis not present

## 2024-05-11 DIAGNOSIS — M810 Age-related osteoporosis without current pathological fracture: Secondary | ICD-10-CM | POA: Insufficient documentation

## 2024-05-11 DIAGNOSIS — D508 Other iron deficiency anemias: Secondary | ICD-10-CM

## 2024-05-11 DIAGNOSIS — R197 Diarrhea, unspecified: Secondary | ICD-10-CM | POA: Diagnosis not present

## 2024-05-11 DIAGNOSIS — K219 Gastro-esophageal reflux disease without esophagitis: Secondary | ICD-10-CM | POA: Insufficient documentation

## 2024-05-11 DIAGNOSIS — Z859 Personal history of malignant neoplasm, unspecified: Secondary | ICD-10-CM

## 2024-05-11 DIAGNOSIS — Z08 Encounter for follow-up examination after completed treatment for malignant neoplasm: Secondary | ICD-10-CM | POA: Diagnosis not present

## 2024-05-11 DIAGNOSIS — E785 Hyperlipidemia, unspecified: Secondary | ICD-10-CM | POA: Insufficient documentation

## 2024-05-11 LAB — CBC WITH DIFFERENTIAL (CANCER CENTER ONLY)
Abs Immature Granulocytes: 0.03 K/uL (ref 0.00–0.07)
Basophils Absolute: 0.1 K/uL (ref 0.0–0.1)
Basophils Relative: 0 %
Eosinophils Absolute: 0.1 K/uL (ref 0.0–0.5)
Eosinophils Relative: 1 %
HCT: 37.8 % (ref 36.0–46.0)
Hemoglobin: 11.5 g/dL — ABNORMAL LOW (ref 12.0–15.0)
Immature Granulocytes: 0 %
Lymphocytes Relative: 27 %
Lymphs Abs: 3 K/uL (ref 0.7–4.0)
MCH: 26.9 pg (ref 26.0–34.0)
MCHC: 30.4 g/dL (ref 30.0–36.0)
MCV: 88.5 fL (ref 80.0–100.0)
Monocytes Absolute: 0.8 K/uL (ref 0.1–1.0)
Monocytes Relative: 8 %
Neutro Abs: 7.1 K/uL (ref 1.7–7.7)
Neutrophils Relative %: 64 %
Platelet Count: 277 K/uL (ref 150–400)
RBC: 4.27 MIL/uL (ref 3.87–5.11)
RDW: 15.3 % (ref 11.5–15.5)
WBC Count: 11.2 K/uL — ABNORMAL HIGH (ref 4.0–10.5)
nRBC: 0 % (ref 0.0–0.2)

## 2024-05-11 LAB — VITAMIN B12: Vitamin B-12: 334 pg/mL (ref 180–914)

## 2024-05-11 MED ORDER — CYANOCOBALAMIN 1000 MCG/ML IJ SOLN
1000.0000 ug | INTRAMUSCULAR | Status: DC
  Administered 2024-05-11: 1000 ug via INTRAMUSCULAR
  Filled 2024-05-11: qty 1

## 2024-05-11 NOTE — Progress Notes (Signed)
 Hematology/Oncology Consult note [facility]  [phone] [phone]  Patient Care Team: Sowles, Krichna, MD as PCP - General (Family Medicine) Darron Deatrice LABOR, MD as PCP - Cardiology (Cardiology) Melanee Annah BROCKS, MD as Consulting Physician (Oncology) Rennie Cindy SAUNDERS, MD as Consulting Physician (Internal Medicine) Jackquline Sawyer, MD (Dermatology) Melanee Annah BROCKS, MD as Consulting Physician (Hematology and Oncology)   Name of the patient: Susan Bowen  [id number]  Feb 08, 1932   Date of visit: 05/11/24  Diagnosis- 1. intra abdominal adenopathy- metastatic carcinoid on surveillance 2.  Iron  and B12 deficiency anemia  Chief complaint/ Reason for visit-routine follow-up of carcinoid  Heme/Onc history:  Patient is a 89 year old female who underwent CT renal stone study for flank pain.  CT scan showed pathologically enlarged lymph nodes in the right lower quadrant measuring up to 2.6 cm.  Findings concerning for lymphoma or malignant process.  Patient's past medical history significant for pulmonary hypertension, CKD, hypothyroidism among other medical problems.  She did not have any B symptoms.  This was followed by a PET CT scan Which showed 3 prominent soft tissue nodule/lymph nodes one in the mesentery at 2.1 cm with an SUV of 2.8.  Adjacent soft tissue nodule/lymph node in the right iliac fossa measuring 3.1 cm and SUV of 2.8.  Predominantly fat attenuation mesenteric nodule in the left lower quadrant measuring 2 cm without any significant FDG uptake   She remains on surveillance and has not started any somatostatin therapy yet    Interval history-patient is doing well for her age.  She lives with her son but is independent of her ADLs and IADLs.  Denies any nausea or vomiting.  Occasional self-limited diarrhea.  ECOG PS- 1 Pain scale- 0   Review of systems- Review of Systems  Constitutional:  Negative for chills, fever,  malaise/fatigue and weight loss.  HENT:  Negative for congestion, ear discharge and nosebleeds.   Eyes:  Negative for blurred vision.  Respiratory:  Negative for cough, hemoptysis, sputum production, shortness of breath and wheezing.   Cardiovascular:  Negative for chest pain, palpitations, orthopnea and claudication.  Gastrointestinal:  Negative for abdominal pain, blood in stool, constipation, diarrhea, heartburn, melena, nausea and vomiting.  Genitourinary:  Negative for dysuria, flank pain, frequency, hematuria and urgency.  Musculoskeletal:  Negative for back pain, joint pain and myalgias.  Skin:  Negative for rash.  Neurological:  Negative for dizziness, tingling, focal weakness, seizures, weakness and headaches.  Endo/Heme/Allergies:  Does not bruise/bleed easily.  Psychiatric/Behavioral:  Negative for depression and suicidal ideas. The patient does not have insomnia.       Allergies  Allergen Reactions   Augmentin [Amoxicillin-Pot Clavulanate] Itching    Has patient had a PCN reaction causing immediate rash, facial/tongue/throat swelling, SOB or lightheadedness with hypotension: No Has patient had a PCN reaction causing severe rash involving mucus membranes or skin necrosis: No Has patient had a PCN reaction that required hospitalization: No Has patient had a PCN reaction occurring within the last 10 years: No If all of the above answers are NO, then may proceed with Cephalosporin use.      Past Medical History:  Diagnosis Date   Allergy    Back spasm    Bunion    Carpal tunnel syndrome    Cataracta    Diabetes mellitus without complication (HCC)    Generalized osteoarthritis    GERD (gastroesophageal reflux disease)    Gout    Hemorrhoids without complication  Hyperlipidemia    Hypertension    Hypothyroidism    Impingement syndrome of right shoulder    Insomnia    Lipoma    Lung nodule    Metastatic carcinoid tumor (HCC) 03/2016   Neuritis or radiculitis  due to rupture of lumbar intervertebral disc    Obesity    Osteoporosis    PAF (paroxysmal atrial fibrillation) (HCC)    a. 03/2018 AF w/ RVR-->converted spont.  CHA2DS2VASc = 7-->Eliquis .   Proteinuria    Rotator cuff tear    Systolic murmur    a. 03/2018 Echo: EF 60-65%, no rwma, Gr1 DD, mild MR, mildly dil LA. Nl RV fxn. PASP .   TIA (transient ischemic attack)    TIA (transient ischemic attack) 1998   small TIA.  no residual effects   Tinnitus of both ears    Unspecified glaucoma(365.9)    Vitamin D  deficiency    Wedge compression fracture of t11-T12 vertebra, sequela      Past Surgical History:  Procedure Laterality Date   ABDOMINAL HYSTERECTOMY  1986   APPENDECTOMY     BREAST SURGERY Bilateral 1983   biopsy of each side, negative   CARPAL TUNNEL RELEASE     CATARACT EXTRACTION W/PHACO Left 09/03/2017   Procedure: CATARACT EXTRACTION PHACO AND INTRAOCULAR LENS PLACEMENT (IOC)-LEFT DIABETIC;  Surgeon: Jaye Fallow, MD;  Location: [facility] ORS;  Service: Ophthalmology;  Laterality: Left;  US  01:10.2 AP% 16.7 CDE 11.71 Fluid Pack Lot # [id number]   CATARACT EXTRACTION W/PHACO Right 09/30/2017   Procedure: CATARACT EXTRACTION PHACO AND INTRAOCULAR LENS PLACEMENT (IOC);  Surgeon: Jaye Fallow, MD;  Location: [facility] ORS;  Service: Ophthalmology;  Laterality: Right;  US  01:35.1 AP% 13.1 CDE 12.44 Fluid pack Lot # [id number]   COLON SURGERY     EYE SURGERY Left 09/03/2017   Cataract extraction   FEMUR FRACTURE SURGERY Left    FRACTURE SURGERY  2001   left femur   HEMORROIDECTOMY     MULTIPLE TOOTH EXTRACTIONS     polyp removed from vocal cord      Social History   Socioeconomic History   Marital status: Divorced    Spouse name: Not on file   Number of children: 6   Years of education: Not on file   Highest education level: 10th grade  Occupational History   Not on file  Tobacco Use   Smoking status: Never   Smokeless tobacco: Never   Tobacco comments:     Smoking cessation materials not required  Vaping Use   Vaping status: Never Used  Substance and Sexual Activity   Alcohol use: No    Alcohol/week: 0.0 standard drinks of alcohol   Drug use: No   Sexual activity: Not Currently  Other Topics Concern   Not on file  Social History Narrative   Pt lives with her son; independent ADL's, no longer drives   Social Drivers of Corporate investment banker Strain: Low Risk  (02/05/2023)   Overall Financial Resource Strain (CARDIA)    Difficulty of Paying Living Expenses: Not hard at all  Recent Concern: Financial Resource Strain - High Risk (12/05/2022)   Overall Financial Resource Strain (CARDIA)    Difficulty of Paying Living Expenses: Very hard  Food Insecurity: No Food Insecurity (12/05/2022)   Hunger Vital Sign    Worried About Running Out of Food in the Last Year: Never true    Ran Out of Food in the Last Year: Never true  Transportation Needs: No Transportation  Needs (02/05/2023)   PRAPARE - Administrator, Civil Service (Medical): No    Lack of Transportation (Non-Medical): No  Physical Activity: Insufficiently Active (12/05/2022)   Exercise Vital Sign    Days of Exercise per Week: 7 days    Minutes of Exercise per Session: 20 min  Stress: No Stress Concern Present (12/05/2022)   [employer] of Occupational Health - Occupational Stress Questionnaire    Feeling of Stress : Not at all  Social Connections: Moderately Integrated (12/05/2022)   Social Connection and Isolation Panel    Frequency of Communication with Friends and Family: More than three times a week    Frequency of Social Gatherings with Friends and Family: More than three times a week    Attends Religious Services: More than 4 times per year    Active Member of [employer] or Organizations: Yes    Attends Banker Meetings: 1 to 4 times per year    Marital Status: Divorced  Intimate Partner Violence: Not At Risk (12/05/2022)   Humiliation, Afraid, Rape, and  Kick questionnaire    Fear of Current or Ex-Partner: No    Emotionally Abused: No    Physically Abused: No    Sexually Abused: No    Family History  Problem Relation Age of Onset   Diabetes Mother    Hypertension Mother    Dementia Mother    Hypertension Son      Current Outpatient Medications:    acetaminophen  (TYLENOL ) 500 MG tablet, Take 500 mg by mouth every 6 (six) hours as needed., Disp: , Rfl:    Alcohol Swabs (DROPSAFE ALCOHOL PREP) 70 % PADS, USE TWICE DAILY, Disp: 200 each, Rfl: 3   apixaban  (ELIQUIS ) 5 MG TABS tablet, Take 1 tablet (5 mg total) by mouth 2 (two) times daily., Disp: 180 tablet, Rfl: 3   Blood Glucose Calibration (TRUE METRIX LEVEL 1) Low SOLN, 1 each by In Vitro route once for 1 dose., Disp: 1 each, Rfl: 3   Blood Glucose Monitoring Suppl (ACCU-CHEK GUIDE) w/Device KIT, PLEASE SEE ATTACHED FOR DETAILED DIRECTIONS, Disp: , Rfl:    Blood Glucose Monitoring Suppl DEVI, 1 each by Does not apply route in the morning, at noon, and at bedtime. May substitute to any manufacturer covered by patient's insurance., Disp: 1 each, Rfl: 0   carvedilol  (COREG ) 25 MG tablet, Take 1 tablet (25 mg total) by mouth 2 (two) times daily., Disp: 180 tablet, Rfl: 1   diltiazem  (CARDIZEM  CD) 120 MG 24 hr capsule, TAKE 1 CAPSULE BY MOUTH EVERY DAY, Disp: 90 capsule, Rfl: 3   dorzolamide-timolol  (COSOPT) 22.3-6.8 MG/ML ophthalmic solution, Place 1 drop into both eyes 2 (two) times daily., Disp: , Rfl:    Dulaglutide  (TRULICITY ) 3 MG/0.5ML SOAJ, INJECT 3 MG INTO THE SKIN AS DIRECTED ONCE A WEEK, Disp: 2 mL, Rfl: 5   empagliflozin  (JARDIANCE ) 25 MG TABS tablet, Take 1 tablet (25 mg total) by mouth daily., Disp: 90 tablet, Rfl: 1   fluticasone  (FLONASE ) 50 MCG/ACT nasal spray, SPRAY 2 SPRAYS INTO EACH NOSTRIL EVERY DAY, Disp: 48 mL, Rfl: 2   furosemide  (LASIX ) 20 MG tablet, TAKE 1 TABLET BY MOUTH EVERY DAY, Disp: 90 tablet, Rfl: 1   glucose blood test strip, CHECK FASTING GLUCOSE ONCE EACH  MORNING, AND CHECK GLUCOSE AS NEEDED., Disp: , Rfl:    ketoconazole  (NIZORAL ) 2 % cream, APPLY 1 APPLICATION TOPICALLY DAILY, Disp: 60 g, Rfl: 0   levothyroxine  (SYNTHROID ) 50 MCG tablet, TAKE  1 TABLET (50 MCG TOTAL) BY MOUTH DAILY BEFORE BREAKFAST. AND TAKE TWO TABLETS TWICE A WEEK, Disp: 114 tablet, Rfl: 1   loratadine  (CLARITIN ) 10 MG tablet, Take 1 tablet (10 mg total) by mouth daily., Disp: 90 tablet, Rfl: 1   metFORMIN  (GLUCOPHAGE -XR) 500 MG 24 hr tablet, Take 1 tablet (500 mg total) by mouth daily with breakfast., Disp: 90 tablet, Rfl: 1   Misc Natural Products (NEURIVA PO), Take 1 tablet by mouth daily., Disp: , Rfl:    olmesartan  (BENICAR ) 40 MG tablet, Take 1 tablet (40 mg total) by mouth daily., Disp: 90 tablet, Rfl: 1   pravastatin  (PRAVACHOL ) 40 MG tablet, Take 1 tablet (40 mg total) by mouth daily., Disp: 90 tablet, Rfl: 1   pregabalin  (LYRICA ) 50 MG capsule, Take 1 capsule (50 mg total) by mouth every evening., Disp: 90 capsule, Rfl: 1   spironolactone  (ALDACTONE ) 25 MG tablet, Take 1 tablet (25 mg total) by mouth daily., Disp: 90 tablet, Rfl: 3   tacrolimus  (PROTOPIC ) 0.1 % ointment, APPLY TWICE A DAY TO RASH UNDER BREAST AND IN GROIN FOR 2 WEEKS, THEN DECREASE TO EVERY DAY UNTIL CLEAR, THEN PRN FLARES, Disp: 100 g, Rfl: 2   Travoprost , BAK Free, (TRAVATAN ) 0.004 % SOLN ophthalmic solution, Place 1 drop into both eyes at bedtime., Disp: , Rfl:    TRUE METRIX BLOOD GLUCOSE TEST test strip, TEST BLOOD SUGAR IN THE MORNING, AT NOON, AND AT BEDTIME, Disp: 100 strip, Rfl: 0   TRUEplus Lancets 33G MISC, USE AS DIRECTED, Disp: 300 each, Rfl: 3 No current facility-administered medications for this visit.  Facility-Administered Medications Ordered in Other Visits:    cyanocobalamin  (VITAMIN B12) injection 1,000 mcg, 1,000 mcg, Intramuscular, Q30 days, Melanee Annah BROCKS, MD, 1,000 mcg at 10/14/22 1540   cyanocobalamin  (VITAMIN B12) injection 1,000 mcg, 1,000 mcg, Intramuscular, Once, Melanee Annah BROCKS, MD   cyanocobalamin  (VITAMIN B12) injection 1,000 mcg, 1,000 mcg, Intramuscular, Q30 days, Melanee Annah BROCKS, MD, 1,000 mcg at 05/19/23 1131   cyanocobalamin  (VITAMIN B12) injection 1,000 mcg, 1,000 mcg, Intramuscular, Q30 days, Melanee Annah BROCKS, MD, 1,000 mcg at 05/11/24 1538  Physical exam:  Vitals:   05/11/24 1521  BP: (!) 141/78  Pulse: 64  Resp: 20  Temp: 98.6 F (37 C)  SpO2: 100%  Weight: 189 lb 6.4 oz (85.9 kg)   Physical Exam Cardiovascular:     Rate and Rhythm: Normal rate and regular rhythm.     Heart sounds: Normal heart sounds.  Pulmonary:     Effort: Pulmonary effort is normal.     Breath sounds: Normal breath sounds.  Abdominal:     General: Bowel sounds are normal. There is no distension.     Palpations: Abdomen is soft.     Tenderness: There is no abdominal tenderness.  Skin:    General: Skin is warm and dry.  Neurological:     Mental Status: She is alert and oriented to person, place, and time.      I have personally reviewed labs listed below:    Latest Ref Rng & Units 01/20/2024   11:58 AM  CMP  Glucose 65 - 99 mg/dL 887   BUN 7 - 25 mg/dL 13   Creatinine 9.39 - 0.95 mg/dL 8.95   Sodium 864 - 853 mmol/L 139   Potassium 3.5 - 5.3 mmol/L 4.5   Chloride 98 - 110 mmol/L 107   CO2 20 - 32 mmol/L 26   Calcium 8.6 - 10.4 mg/dL 10.1  Total Protein 6.1 - 8.1 g/dL 6.5   Total Bilirubin 0.2 - 1.2 mg/dL 0.4   AST 10 - 35 U/L 10   ALT 6 - 29 U/L 7       Latest Ref Rng & Units 05/11/2024    2:50 PM  CBC  WBC 4.0 - 10.5 K/uL 11.2   Hemoglobin 12.0 - 15.0 g/dL 88.4   Hematocrit 63.9 - 46.0 % 37.8   Platelets 150 - 400 K/uL 277    I have personally reviewed Radiology images listed below: No images are attached to the encounter.  NM PET CT CARDIAC PERFUSION MULTI W/ABSOLUTE BLOODFLOW Result Date: 04/22/2024   LV perfusion is normal.   Rest left ventricular function is normal. Rest EF: 63%. Stress left ventricular function is normal. Stress EF:  69%. End diastolic cavity size is normal.   Myocardial blood flow was computed to be 1.4ml/g/min at rest and 2.01ml/g/min at stress. Global myocardial blood flow reserve was 1.76 and was mildly abnormal.  This is nonspecific but can be seen with microvascular dysfunction and balanced ischemia.   Coronary calcium was present on the attenuation correction CT images. Mild coronary calcifications were present.   The study is low risk.   Electronically signed by: Lonni Hanson, MD. CLINICAL DATA:  This over-read does not include interpretation of cardiac or coronary anatomy or pathology. The Cardiac PET CT interpretation by the cardiologist is attached. COMPARISON:  Chest CT of 03/07/2020 FINDINGS: No pleural fluid. Bibasilar scarring and/or subsegmental atelectasis. 2-3 mm lingular nodule on 32/4. 2 mm nodule along the right minor fissure on 24/4 is most likely a subpleural lymph node. Aortic atherosclerosis.  Tortuous thoracic aorta. Pulmonary artery enlargement, outflow tract 3.6 cm. No imaged thoracic adenopathy.  Tiny hiatal hernia. Normal imaged portions of the liver, spleen, pancreas, gallbladder, kidneys. Mild bilateral adrenal thickening. No acute osseous abnormality. IMPRESSION: No acute findings in the imaged extracardiac chest. Tiny bilateral pulmonary nodules. No follow-up needed if patient is low-risk. Non-contrast chest CT can be considered in 12 months if patient is high-risk, nodule is upper lobe, and/or suspicious in morphology. This recommendation follows the consensus statement: Guidelines for Management of Incidental Pulmonary Nodules Detected on CT Images: From the Fleischner Society 2017; Radiology 2017; 284:228-243. Pulmonary artery enlargement suggests pulmonary arterial hypertension. Aortic Atherosclerosis (ICD10-I70.0). Electronically Signed   By: Rockey Kilts M.D.   On: 04/22/2024 13:07    Assessment and plan- Patient is a 89 y.o. female here for routine follow up of following  issues:  History of carcinoid: This was diagnosed back in 2020 and she has known mesenteric lymph nodes which have not changed in size over the years.  Her last CT abdomen from July 2024 showed stable lymph nodes measuring about 2.5 cm.  No new adenopathy.  Clinically she is doing well with no concerning signs and symptoms of recurrence based on today's exam.  I will plan to see her back in 1 year with CT abdomen prior  History of iron  B12 deficiency anemia: Hemoglobin is presently at her baseline around 11.5.  B12 levels are pending.  Continue to monitor   Visit Diagnosis 1. Encounter for follow-up surveillance of malignant carcinoid tumor      Dr. Annah Skene, MD, MPH [facility] at [facility] [phone] 05/11/2024 4:21 PM

## 2024-05-13 LAB — CHROMOGRANIN A: Chromogranin A (ng/mL): 288.2 ng/mL — ABNORMAL HIGH (ref 0.0–101.8)

## 2024-05-19 ENCOUNTER — Inpatient Hospital Stay: Payer: Medicare HMO

## 2024-05-19 ENCOUNTER — Ambulatory Visit: Payer: Medicare HMO | Admitting: Oncology

## 2024-05-19 ENCOUNTER — Other Ambulatory Visit: Payer: Medicare HMO

## 2024-05-21 ENCOUNTER — Other Ambulatory Visit: Payer: Self-pay | Admitting: Family Medicine

## 2024-05-21 DIAGNOSIS — E1169 Type 2 diabetes mellitus with other specified complication: Secondary | ICD-10-CM

## 2024-05-21 NOTE — Telephone Encounter (Signed)
 Copied from CRM [id number]. Topic: Clinical - Medication Refill >> May 21, 2024  3:55 PM Shardie S wrote: Medication: Dulaglutide  (TRULICITY ) 3 MG/0.5ML SOAJ  Has the patient contacted their pharmacy? Yes (Agent: If no, request that the patient contact the pharmacy for the refill. If patient does not wish to contact the pharmacy document the reason why and proceed with request.) (Agent: If yes, when and what did the pharmacy advise?)  This is the patient's preferred pharmacy:  [id number] [place], [place] - [address] [place] [address] Phone: [phone] Fax: [phone]  Is this the correct pharmacy for this prescription? Yes If no, delete pharmacy and type the correct one.   Has the prescription been filled recently? No  Is the patient out of the medication? Yes  Has the patient been seen for an appointment in the last year OR does the patient have an upcoming appointment? Yes  Can we respond through MyChart? No  Agent: Please be advised that Rx refills may take up to 3 business days. We ask that you follow-up with your pharmacy.

## 2024-05-24 NOTE — Telephone Encounter (Signed)
 Too soon for refill.  Requested Prescriptions  Pending Prescriptions Disp Refills   Dulaglutide  (TRULICITY ) 3 MG/0.5ML SOAJ 2 mL 5     Endocrinology:  Diabetes - GLP-1 Receptor Agonists Passed - 05/24/2024  3:52 PM      Passed - HBA1C is between 0 and 7.9 and within 180 days    Hemoglobin A1C  Date Value Ref Range Status  01/20/2024 6.4 (A) 4.0 - 5.6 % Final  01/03/2024 6.6  Final         Passed - Valid encounter within last 6 months    Recent Outpatient Visits           4 months ago Dyslipidemia associated with type 2 diabetes mellitus [facility])   [facility] Glenard Mire, MD       Future Appointments             In 2 months Glenard, Krichna, MD [facility]   In 3 months Furth, Cadence H, PA-C [facility] [place]

## 2024-06-22 ENCOUNTER — Other Ambulatory Visit: Payer: Self-pay | Admitting: Family Medicine

## 2024-06-22 DIAGNOSIS — I5032 Chronic diastolic (congestive) heart failure: Secondary | ICD-10-CM

## 2024-06-22 DIAGNOSIS — E1169 Type 2 diabetes mellitus with other specified complication: Secondary | ICD-10-CM

## 2024-06-22 DIAGNOSIS — I10 Essential (primary) hypertension: Secondary | ICD-10-CM

## 2024-07-23 ENCOUNTER — Ambulatory Visit: Admitting: Family Medicine

## 2024-07-23 ENCOUNTER — Encounter: Payer: Self-pay | Admitting: Family Medicine

## 2024-07-23 VITALS — BP 122/74 | HR 83 | Resp 16 | Ht 64.0 in | Wt 199.8 lb

## 2024-07-23 DIAGNOSIS — C772 Secondary and unspecified malignant neoplasm of intra-abdominal lymph nodes: Secondary | ICD-10-CM

## 2024-07-23 DIAGNOSIS — I480 Paroxysmal atrial fibrillation: Secondary | ICD-10-CM

## 2024-07-23 DIAGNOSIS — I5032 Chronic diastolic (congestive) heart failure: Secondary | ICD-10-CM | POA: Diagnosis not present

## 2024-07-23 DIAGNOSIS — E1159 Type 2 diabetes mellitus with other circulatory complications: Secondary | ICD-10-CM | POA: Diagnosis not present

## 2024-07-23 DIAGNOSIS — E669 Obesity, unspecified: Secondary | ICD-10-CM

## 2024-07-23 DIAGNOSIS — I700 Atherosclerosis of aorta: Secondary | ICD-10-CM

## 2024-07-23 DIAGNOSIS — E1169 Type 2 diabetes mellitus with other specified complication: Secondary | ICD-10-CM

## 2024-07-23 DIAGNOSIS — N1831 Chronic kidney disease, stage 3a: Secondary | ICD-10-CM

## 2024-07-23 DIAGNOSIS — E890 Postprocedural hypothyroidism: Secondary | ICD-10-CM

## 2024-07-23 DIAGNOSIS — I739 Peripheral vascular disease, unspecified: Secondary | ICD-10-CM

## 2024-07-23 DIAGNOSIS — Z23 Encounter for immunization: Secondary | ICD-10-CM | POA: Diagnosis not present

## 2024-07-23 DIAGNOSIS — C7A019 Malignant carcinoid tumor of the small intestine, unspecified portion: Secondary | ICD-10-CM | POA: Diagnosis not present

## 2024-07-23 DIAGNOSIS — E785 Hyperlipidemia, unspecified: Secondary | ICD-10-CM

## 2024-07-23 DIAGNOSIS — I272 Pulmonary hypertension, unspecified: Secondary | ICD-10-CM

## 2024-07-23 DIAGNOSIS — I152 Hypertension secondary to endocrine disorders: Secondary | ICD-10-CM

## 2024-07-23 LAB — POCT GLYCOSYLATED HEMOGLOBIN (HGB A1C): Hemoglobin A1C: 7.1 % — AB (ref 4.0–5.6)

## 2024-07-23 MED ORDER — LEVOTHYROXINE SODIUM 50 MCG PO TABS: 50.0000 ug | ORAL_TABLET | Freq: Every day | ORAL | 1 refills | Status: AC

## 2024-07-23 MED ORDER — CARVEDILOL 25 MG PO TABS: 25.0000 mg | ORAL_TABLET | Freq: Two times a day (BID) | ORAL | 1 refills | Status: AC

## 2024-07-23 MED ORDER — FUROSEMIDE 20 MG PO TABS: 20.0000 mg | ORAL_TABLET | Freq: Every day | ORAL | 1 refills | Status: AC

## 2024-07-23 MED ORDER — EMPAGLIFLOZIN 25 MG PO TABS: 25.0000 mg | ORAL_TABLET | Freq: Every day | ORAL | 1 refills | Status: AC

## 2024-07-23 MED ORDER — OLMESARTAN MEDOXOMIL 40 MG PO TABS: 40.0000 mg | ORAL_TABLET | Freq: Every day | ORAL | 1 refills | Status: AC

## 2024-07-23 MED ORDER — METFORMIN HCL ER 500 MG PO TB24
500.0000 mg | ORAL_TABLET | Freq: Every day | ORAL | 1 refills | Status: AC
Start: 2024-07-23 — End: ?

## 2024-07-23 MED ORDER — PRAVASTATIN SODIUM 40 MG PO TABS: 40.0000 mg | ORAL_TABLET | Freq: Every day | ORAL | 1 refills | Status: AC

## 2024-07-23 NOTE — Progress Notes (Addendum)
 Name: Susan Bowen   MRN: [id number]    DOB: 03-11-1932   Date:07/23/2024       Progress Note  Subjective  Chief Complaint  Chief Complaint  Patient presents with   Medical Management of Chronic Issues   Discussed the use of AI scribe software for clinical note transcription with the patient, who gave verbal consent to proceed.  History of Present Illness Susan Bowen is a 89 year old female with diabetes, hypertension, and obesity who presents for a regular follow-up visit.  Since her last visit in March, she experienced an episode where her knees gave out, requiring the use of a walker for about a week and a half. She used topical treatments and her knees improved. She continues to use a walker and cane and reports a recent near-fall incident at church, legs giving out when not using her walker.  Her diabetes is managed with metformin  500 mg, Jardiance  25 mg, and Trulicity  3 mg. Her A1c has increased from 6.4 in March to 7.1 currently. She attributes this to snacking on potato chips brought by her son, a habit she has since stopped. No increased hunger, thirst, or frequent urination.  For hypertension, she takes olmesartan  40 mg, carvedilol  25 mg twice daily, and Cardizem  for rhythm control of paroxysmal atrial fibrillation. She also takes Eliquis  5 mg twice daily. She has not experienced major bleeding, palpitations, or shortness of breath recently. She sees cardiologist   Her hyperlipidemia is managed with pravastatin , and her cholesterol levels were good in March. She also has a history of atherosclerosis of the aorta.  She has chronic kidney disease stage 3A and congestive heart failure, for which she takes spironolactone , Jardiance , olmesartan , carvedilol , and Lasix  as needed. 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 No recent episodes of diarrhea, nausea, or vomiting, but had constipation once, which she managed with Metamucil.  She has pulmonary hypertension and sees a cardiologist regularly. Her last echocardiogram was in 2023.  She has a history of small vessel disease in the brain and reports slight headaches recently, which resolve with Tylenol . She takes loratadine  for seasonal allergies.  She has hypothyroidism post-radioactive iodine  treatment and takes levothyroxine  50 mcg daily, with an increased dose twice a week. She recently refilled her prescription.  She reports a sore right ear.  She experiences occasional memory lapses but manages daily activities independently.    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) 09/18/2021   Iron  deficiency anemia 12/04/2020   Carcinoid tumor, malignant (HCC) 09/08/2019   Pain in joint of left shoulder 02/25/2019   Pulmonary hypertension, unspecified (HCC) 12/28/2018   Paroxysmal A-fib (HCC) 12/28/2018   Chronic kidney disease, stage III (moderate) (HCC) 04/01/2018   Elevated parathyroid  hormone 01/01/2018   Elevated uric acid in blood 04/29/2017   Plantar fasciitis of left foot 03/12/2016   Postprocedural hypothyroidism 01/23/2016   Aortic stenosis 09/26/2015   Allergic rhinitis, seasonal 08/08/2015   Benign hypertension 08/08/2015   History of pneumonia 08/08/2015   Carpal tunnel syndrome 08/08/2015   Cataract 08/08/2015   Insomnia, persistent 08/08/2015  Dyslipidemia 08/08/2015   History of depression 08/08/2015   Glaucoma 08/08/2015   Controlled gout 08/08/2015   Fever blister 08/08/2015   Bilateral hearing loss 08/08/2015   Hemorrhoid 08/08/2015   H/O iron  deficiency anemia 08/08/2015   Benign  neoplasm of colon 08/08/2015   Impingement syndrome of shoulder 08/08/2015   Osteoporosis, post-menopausal 08/08/2015   Generalized OA 08/08/2015   Multinodular goiter 08/08/2015   Asymptomatic varicose veins 08/08/2015   Polyp of vocal cord 08/08/2015   History of vertebral compression fracture 08/08/2015   Diabetes mellitus with proteinuric diabetic nephropathy (HCC) 08/08/2015   LVH (left ventricular hypertrophy) 08/08/2015   Moderate tricuspid regurgitation 08/08/2015   History of radioactive iodine  thyroid  ablation 08/08/2015   Lung nodule, solitary 05/12/2014   Chronic cough 05/11/2014   Vitamin D  deficiency 12/20/2009   Plantar fascial fibromatosis 12/20/2009   Personal history of fall 07/20/2008   Cervical radiculitis 05/11/2008    Past Surgical History:  Procedure Laterality Date   ABDOMINAL HYSTERECTOMY  1986   APPENDECTOMY     BREAST SURGERY Bilateral 1983   biopsy of each side, negative   CARPAL TUNNEL RELEASE     CATARACT EXTRACTION W/PHACO Left 09/03/2017   Procedure: CATARACT EXTRACTION PHACO AND INTRAOCULAR LENS PLACEMENT (IOC)-LEFT DIABETIC;  Surgeon: Jaye Fallow, MD;  Location: [facility] ORS;  Service: Ophthalmology;  Laterality: Left;  US  01:10.2 AP% 16.7 CDE 11.71 Fluid Pack Lot # [id number]   CATARACT EXTRACTION W/PHACO Right 09/30/2017   Procedure: CATARACT EXTRACTION PHACO AND INTRAOCULAR LENS PLACEMENT (IOC);  Surgeon: Jaye Fallow, MD;  Location: [facility] OR[facility];  Service: Ophthalmology;  Laterality: Right;  US  01:35.1 AP% 13.1 CDE 12.44 Fluid pack Lot # [id number]   COLON SURGERY     EYE SURGERY Left 09/03/2017   Cataract extraction   FEMUR FRACTURE SURGERY Left    FRACTURE SURGERY  2001   left femur   HEMORROIDECTOMY     MULTIPLE TOOTH EXTRACTIONS     polyp removed from vocal cord      Family History  Problem Relation Age of Onset   Diabetes Mother    Hypertension Mother    Dementia Mother    Hypertension Son     Social History   Tobacco  Use   Smoking status: Never   Smokeless tobacco: Never   Tobacco comments:    Smoking cessation materials not required  Substance Use Topics   Alcohol use: No    Alcohol/week: 0.0 standard drinks of alcohol     Current Outpatient Medications:    acetaminophen  (TYLENOL ) 500 MG tablet, Take 500 mg by mouth every 6 (six) hours as needed., Disp: , Rfl:    Alcohol Swabs (DROPSAFE ALCOHOL PREP) 70 % PADS, USE TWICE DAILY, Disp: 200 each, Rfl: 3   apixaban  (ELIQUIS ) 5 MG TABS tablet, Take 1 tablet (5 mg total) by mouth 2 (two) times daily., Disp: 180 tablet, Rfl: 3   Blood Glucose Monitoring Suppl (ACCU-CHEK GUIDE) w/Device KIT, PLEASE SEE ATTACHED FOR DETAILED DIRECTIONS, Disp: , Rfl:    Blood Glucose Monitoring Suppl DEVI, 1 each by Does not apply route in the morning, at noon, and at bedtime. May substitute to any manufacturer covered by patient's insurance., Disp: 1 each, Rfl: 0   diltiazem  (CARDIZEM  CD) 120 MG 24 hr capsule, TAKE 1 CAPSULE BY MOUTH EVERY DAY, Disp: 90 capsule, Rfl: 3   dorzolamide-timolol  (COSOPT) 22.3-6.8 MG/ML ophthalmic solution, Place 1 drop into both eyes 2 (two) times daily., Disp: , Rfl:    Dulaglutide  (  TRULICITY ) 3 MG/0.5ML SOAJ, INJECT 3 MG INTO THE SKIN AS DIRECTED ONCE A WEEK, Disp: 2 mL, Rfl: 5   fluticasone  (FLONASE ) 50 MCG/ACT nasal spray, SPRAY 2 SPRAYS INTO EACH NOSTRIL EVERY DAY, Disp: 48 mL, Rfl: 2   glucose blood test strip, CHECK FASTING GLUCOSE ONCE EACH MORNING, AND CHECK GLUCOSE AS NEEDED., Disp: , Rfl:    ketoconazole  (NIZORAL ) 2 % cream, APPLY 1 APPLICATION TOPICALLY DAILY, Disp: 60 g, Rfl: 0   loratadine  (CLARITIN ) 10 MG tablet, Take 1 tablet (10 mg total) by mouth daily., Disp: 90 tablet, Rfl: 1   Misc Natural Products (NEURIVA PO), Take 1 tablet by mouth daily., Disp: , Rfl:    pregabalin  (LYRICA ) 50 MG capsule, Take 1 capsule (50 mg total) by mouth every evening., Disp: 90 capsule, Rfl: 1   tacrolimus  (PROTOPIC ) 0.1 % ointment, APPLY TWICE A DAY  TO RASH UNDER BREAST AND IN GROIN FOR 2 WEEKS, THEN DECREASE TO EVERY DAY UNTIL CLEAR, THEN PRN FLARES, Disp: 100 g, Rfl: 2   Travoprost , BAK Free, (TRAVATAN ) 0.004 % SOLN ophthalmic solution, Place 1 drop into both eyes at bedtime., Disp: , Rfl:    TRUE METRIX BLOOD GLUCOSE TEST test strip, TEST BLOOD SUGAR IN THE MORNING, AT NOON, AND AT BEDTIME, Disp: 100 strip, Rfl: 0   TRUEplus Lancets 33G MISC, USE AS DIRECTED, Disp: 300 each, Rfl: 3   Blood Glucose Calibration (TRUE METRIX LEVEL 1) Low SOLN, 1 each by In Vitro route once for 1 dose., Disp: 1 each, Rfl: 3   carvedilol  (COREG ) 25 MG tablet, Take 1 tablet (25 mg total) by mouth 2 (two) times daily., Disp: 180 tablet, Rfl: 1   empagliflozin  (JARDIANCE ) 25 MG TABS tablet, Take 1 tablet (25 mg total) by mouth daily., Disp: 90 tablet, Rfl: 1   furosemide  (LASIX ) 20 MG tablet, Take 1 tablet (20 mg total) by mouth daily., Disp: 90 tablet, Rfl: 1   levothyroxine  (SYNTHROID ) 50 MCG tablet, Take 1 tablet (50 mcg total) by mouth daily before breakfast. And take two tablets twice a week, Disp: 114 tablet, Rfl: 1   metFORMIN  (GLUCOPHAGE -XR) 500 MG 24 hr tablet, Take 1 tablet (500 mg total) by mouth daily with breakfast., Disp: 90 tablet, Rfl: 1   olmesartan  (BENICAR ) 40 MG tablet, Take 1 tablet (40 mg total) by mouth daily., Disp: 90 tablet, Rfl: 1   pravastatin  (PRAVACHOL ) 40 MG tablet, Take 1 tablet (40 mg total) by mouth daily., Disp: 90 tablet, Rfl: 1   spironolactone  (ALDACTONE ) 25 MG tablet, Take 1 tablet (25 mg total) by mouth daily., Disp: 90 tablet, Rfl: 3 No current facility-administered medications for this visit.  Facility-Administered Medications Ordered in Other Visits:    cyanocobalamin  (VITAMIN B12) injection 1,000 mcg, 1,000 mcg, Intramuscular, Once, Melanee Annah BROCKS, MD   cyanocobalamin  (VITAMIN B12) injection 1,000 mcg, 1,000 mcg, Intramuscular, Q30 days, Melanee Annah BROCKS, MD, 1,000 mcg at 05/19/23 1131  Allergies  Allergen Reactions    Augmentin [Amoxicillin-Pot Clavulanate] Itching    Has patient had a PCN reaction causing immediate rash, facial/tongue/throat swelling, SOB or lightheadedness with hypotension: No Has patient had a PCN reaction causing severe rash involving mucus membranes or skin necrosis: No Has patient had a PCN reaction that required hospitalization: No Has patient had a PCN reaction occurring within the last 10 years: No If all of the above answers are NO, then may proceed with Cephalosporin use.     I personally reviewed active problem list, medication list, allergies  with the patient/caregiver today.   ROS  Ten systems reviewed and is negative except as mentioned in HPI    Objective Physical Exam CONSTITUTIONAL: Patient appears well-developed and well-nourished. No distress. HEENT: Head atraumatic, normocephalic, neck supple. Right ear canal with irritation, no cerumen. CARDIOVASCULAR: Normal rate, regular rhythm and normal heart sounds. No murmur heard. No BLE edema. Peripheral pulses intact. Sensation intact in extremities. PULMONARY: Effort normal and breath sounds normal. No respiratory distress. Lungs clear to auscultation. ABDOMINAL: There is no tenderness or distention. MUSCULOSKELETAL: effusion of both knees PSYCHIATRIC: Patient has a normal mood and affect. Behavior is normal. Judgment and thought content normal.  Vitals:   07/23/24 1035  BP: 122/74  Pulse: 83  Resp: 16  SpO2: 96%  Weight: 199 lb 12.8 oz (90.6 kg)  Height: 5' 4 (1.626 m)    Body mass index is 34.3 kg/m.  Recent Results (from the past 2160 hours)  Chromogranin A     Status: Abnormal   Collection Time: 05/11/24  2:50 PM  Result Value Ref Range   Chromogranin A (ng/mL) 288.2 (H) 0.0 - 101.8 ng/mL    Comment: (NOTE) Chromogranin A performed by [employer]mofisher/[employer]RAH[employer] KRYPTOR methodology Values obtained with different assay methods or kits cannot be used interchangeably. Performed At: [facility] [address] [place], [place] [id number] Jennette Shorter MD Ey:[id number]   Vitamin B12     Status: None   Collection Time: 05/11/24  2:50 PM  Result Value Ref Range   Vitamin B-12 334 180 - 914 pg/mL    Comment: (NOTE) This assay is not validated for testing neonatal or myeloproliferative syndrome specimens for Vitamin B12 levels. Performed at [facility], [address], [place], [place] [address]   CBC with Differential (Cancer Center Only)     Status: Abnormal   Collection Time: 05/11/24  2:50 PM  Result Value Ref Range   WBC Count 11.2 (H) 4.0 - 10.5 K/uL   RBC 4.27 3.87 - 5.11 MIL/uL   Hemoglobin 11.5 (L) 12.0 - 15.0 g/dL   HCT 62.1 63.9 - 53.9 %   MCV 88.5 80.0 - 100.0 fL   MCH 26.9 26.0 - 34.0 pg   MCHC 30.4 30.0 - 36.0 g/dL   RDW 84.6 88.4 - 84.4 %   Platelet Count 277 150 - 400 K/uL   nRBC 0.0 0.0 - 0.2 %   Neutrophils Relative % 64 %   Neutro Abs 7.1 1.7 - 7.7 K/uL   Lymphocytes Relative 27 %   Lymphs Abs 3.0 0.7 - 4.0 K/uL   Monocytes Relative 8 %   Monocytes Absolute 0.8 0.1 - 1.0 K/uL   Eosinophils Relative 1 %   Eosinophils Absolute 0.1 0.0 - 0.5 K/uL   Basophils Relative 0 %   Basophils Absolute 0.1 0.0 - 0.1 K/uL   Immature Granulocytes 0 %   Abs Immature Granulocytes 0.03 0.00 - 0.07 K/uL    Comment: Performed at [facility], [address] [place], [place] [address]  POCT glycosylated hemoglobin (Hb A1C)     Status: Abnormal   Collection Time: 07/23/24 10:48 AM  Result Value Ref Range   Hemoglobin A1C 7.1 (A) 4.0 - 5.6 %   HbA1c POC (<> result, manual entry)     HbA1c, POC (prediabetic range)     HbA1c, POC (controlled diabetic range)      Diabetic Foot Exam:     PHQ2/9:    07/23/2024   10:32 AM 01/20/2024  11:04 AM 07/23/2023   10:55 AM 06/26/2023    2:51 PM 04/29/2023    1:39 PM  Depression screen PHQ 2/9  Decreased Interest 0 1 0  0  Down, Depressed, Hopeless 0 1 0 0 0  PHQ - 2 Score 0 2 0 0 0  Altered  sleeping 0 0 0  0  Tired, decreased energy 0 0 0  0  Change in appetite 0 0 0  0  Feeling bad or failure about yourself  0 0 0  0  Trouble concentrating 0 0 0  0  Moving slowly or fidgety/restless 0 0 0  0  Suicidal thoughts 0 0 0  0  PHQ-9 Score 0 2 0  0  Difficult doing work/chores Not difficult at all Somewhat difficult       phq 9 is negative  Fall Risk:    07/23/2024   10:28 AM 01/20/2024   11:04 AM 07/23/2023   10:55 AM 06/26/2023    2:51 PM 05/16/2023    2:55 PM  Fall Risk   Falls in the past year? 0 0 1 1 0  Number falls in past yr: 0 0 1 1   Injury with Fall? 0 0 1 0   Risk for fall due to : No Fall Risks No Fall Risks History of fall(s)  No Fall Risks  Follow up Falls evaluation completed Falls prevention discussed;Education provided;Falls evaluation completed Falls prevention discussed;Education provided;Falls evaluation completed  Falls prevention discussed     Assessment & Plan Type 2 diabetes mellitus with hyperlipidemia, chronic kidney disease stage 3a, hypertension, and obesity A1c increased to 7.1. Blood pressure controlled. Advised on diet changes. - Continue metformin  500 mg. - Continue Jardiance  25 mg. - Continue Trulicity  3 mg. - Continue olmesartan  40 mg. - Continue pravastatin . - Advise reduction of salt intake and avoidance of potato chips. - Perform foot exam.  Chronic diastolic heart failure Managed with medications. Occasional orthopnea and mild edema. Advised on salt intake. - Continue Jardiance . - Continue olmesartan . - Continue carvedilol  25 mg twice a day. - Use Lasix  as needed. - Advise reduction of salt intake.  Paroxysmal atrial fibrillation Controlled with Cardizem  and Eliquis . No recent palpitations or dyspnea. Discussed Eliquis  dose adjustment. - Continue Cardizem . - Continue Eliquis  5 mg twice a day. - Discuss Eliquis  dose with cardiologist.  Pulmonary hypertension No new symptoms.  Atherosclerosis of aorta/Small Vessel  disease Cholesterol levels stable. Managed with pravastatin . - Continue pravastatin .  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 Diarrhea resolved, occasional constipation managed. - Continue monitoring with hematologist. - Use Metamucil for constipation.  Postprocedural hypothyroidism Thyroid  function stable with current levothyroxine  regimen. - Continue levothyroxine  50 mcg daily with additional dose twice a week.  Knee pain, likely osteoarthritis Intermittent pain, recent use of walker. No specialist consultation. - Advise use of walker to prevent falls. - discussed PT referral but she is not interested at this time  Hearing aid-related right ear irritation Irritation present, no infection. - Apply hydrocortisone 10 otc  to affected area.  Mild memory impairment Occasional forgetfulness, no significant daily impact.  Seasonal allergic rhinitis Managed with loratadine . Headaches relieved by Tylenol . - Continue loratadine . - Use Tylenol  for headaches as needed.  Constipation Occasional constipation managed with Metamucil. - Continue Metamucil.

## 2024-08-03 ENCOUNTER — Other Ambulatory Visit: Payer: Self-pay | Admitting: Family Medicine

## 2024-08-03 DIAGNOSIS — E890 Postprocedural hypothyroidism: Secondary | ICD-10-CM

## 2024-08-03 DIAGNOSIS — E1169 Type 2 diabetes mellitus with other specified complication: Secondary | ICD-10-CM

## 2024-08-27 ENCOUNTER — Ambulatory Visit: Admitting: Medical

## 2024-09-01 ENCOUNTER — Other Ambulatory Visit: Payer: Self-pay | Admitting: Family Medicine

## 2024-09-01 DIAGNOSIS — E890 Postprocedural hypothyroidism: Secondary | ICD-10-CM

## 2024-09-01 DIAGNOSIS — E1169 Type 2 diabetes mellitus with other specified complication: Secondary | ICD-10-CM

## 2024-09-06 ENCOUNTER — Other Ambulatory Visit: Payer: Self-pay | Admitting: Family Medicine

## 2024-09-06 ENCOUNTER — Ambulatory Visit: Attending: Medical | Admitting: Medical

## 2024-09-06 ENCOUNTER — Encounter: Payer: Self-pay | Admitting: Medical

## 2024-09-06 VITALS — BP 126/68 | HR 65 | Ht 64.0 in | Wt 200.6 lb

## 2024-09-06 DIAGNOSIS — I2510 Atherosclerotic heart disease of native coronary artery without angina pectoris: Secondary | ICD-10-CM

## 2024-09-06 DIAGNOSIS — I480 Paroxysmal atrial fibrillation: Secondary | ICD-10-CM

## 2024-09-06 DIAGNOSIS — I5032 Chronic diastolic (congestive) heart failure: Secondary | ICD-10-CM | POA: Diagnosis not present

## 2024-09-06 DIAGNOSIS — I272 Pulmonary hypertension, unspecified: Secondary | ICD-10-CM

## 2024-09-06 DIAGNOSIS — E782 Mixed hyperlipidemia: Secondary | ICD-10-CM

## 2024-09-06 DIAGNOSIS — I10 Essential (primary) hypertension: Secondary | ICD-10-CM | POA: Diagnosis not present

## 2024-09-06 DIAGNOSIS — R079 Chest pain, unspecified: Secondary | ICD-10-CM | POA: Diagnosis not present

## 2024-09-06 NOTE — Patient Instructions (Signed)
 Medication Instructions:  Your physician recommends that you continue on your current medications as directed. Please refer to the Current Medication list given to you today.    *If you need a refill on your cardiac medications before your next appointment, please call your pharmacy*  Lab Work: No labs ordered today    Testing/Procedures: No test ordered today   Follow-Up: At [facility], you and your health needs are our priority.  As part of our continuing mission to provide you with exceptional heart care, our providers are all part of one team.  This team includes your primary Cardiologist (physician) and Advanced Practice Providers or APPs (Physician Assistants and Nurse Practitioners) who all work together to provide you with the care you need, when you need it.  Your next appointment:   4 month(s)  Provider:   Deatrice Cage, MD or Cadence Franchester, PA-C

## 2024-09-06 NOTE — Progress Notes (Signed)
 Cardiology Office Note   Date:  09/06/2024  ID:  [name], DOB 29-Dec-1931, MRN [id number] PCP: Sowles, Krichna, MD  [facility] Providers Cardiologist:  Deatrice Cage, MD   History of Present Illness Susan Bowen is a 89 y.o. female with a hx of paroxysmal Afib, chronic diastolic heart failure, pulmonary HTN, HTN, CKD stage 3, CAC, DM2, and HLD who presents for follow-up.   She was hospitalized in May 2019 with chest pain in the setting of Afib RVR. The dose of coreg  was increased and she was started on Eliquis  for anticoagulation. Echo showed normal LVSF, G1DD. Lexiscan  Myoview  in June 2019 showed no evidence of ischemia with normal EF.    In 2020, she was diagnosed with carcinoid tumor with metastasis. This was an incidental finding on CT of the abdomen which was done for renal stone. This is monitor by oncology.    Echo in August 2022 showed normal LVSF, G1DD, moderate to severe pulmonary HTN.    She had an ER visit in 09/2022 for Afib RVR. She improved with IV dilt. She was discharged home on diltiazem  extended release 120mg  once daily.  At follow-up patient was in normal sinus rhythm.   Patient was seen 02/26/2024 reporting shortness of breath and chest tightness.  Cardiac PET stress was normal and low risk.  Mild coronary calcifications present.  The patient was last seen 04/26/24 reporting occasional palpitations.   Today, the patient is overall Ok. Over the last 3-4 weeks she notes slight headache, a little heart fluttering and mild chest pains. Chest pain occurs at night when she is laying down. She has no exertional symptoms. She is staying hydrated. She is eating normally. She denies fever, chills. No lower leg edema. She has chronic SOB that is unchanged.   Studies Reviewed EKG Interpretation Date/Time:  Monday September 06 2024 10:06:20 EST Ventricular Rate:  65 PR Interval:  146 QRS Duration:  74 QT Interval:  354 QTC Calculation: 368 R Axis:   0  Text  Interpretation: Sinus rhythm with Premature ventricular complexes or Fusion complexes When compared with ECG of 26-Apr-2024 11:08, Fusion complexes are now Present Premature ventricular complexes are now Present ST elevation now present in Lateral leads Confirmed by Franchester, Darrielle Pflieger ([id number]) on 09/06/2024 10:17:42 AM    Cardiac PET stress test 04/2024   LV perfusion is normal.   Rest left ventricular function is normal. Rest EF: 63%. Stress left ventricular function is normal. Stress EF: 69%. End diastolic cavity size is normal.   Myocardial blood flow was computed to be 1.38ml/g/min at rest and 2.01ml/g/min at stress. Global myocardial blood flow reserve was 1.76 and was mildly abnormal.  This is nonspecific but can be seen with microvascular dysfunction and balanced ischemia.   Coronary calcium was present on the attenuation correction CT images. Mild coronary calcifications were present.   The study is low risk.   Electronically signed by: Lonni Hanson, MD.   Echo 05/2022  1. Left ventricular ejection fraction, by estimation, is 60 to 65%. The  left ventricle has normal function. The left ventricle has no regional  wall motion abnormalities. Left ventricular diastolic parameters are  consistent with Grade I diastolic  dysfunction (impaired relaxation).   2. Right ventricular systolic function is normal. The right ventricular  size is normal. There is moderately elevated pulmonary artery systolic  pressure. The estimated right ventricular systolic pressure is 48.8 mmHg.   3. Left atrial size was mildly dilated.   4. The  mitral valve is normal in structure. Mild mitral valve  regurgitation. No evidence of mitral stenosis.   5. Tricuspid valve regurgitation is mild to moderate.   6. The aortic valve is normal in structure. Aortic valve regurgitation is  not visualized. Aortic valve sclerosis is present, with no evidence of  aortic valve stenosis. Aortic valve area, by VTI measures 1.63 cm.  Aortic  valve mean gradient measures 7.0  mmHg.   7. The inferior vena cava is normal in size with greater than 50%  respiratory variability, suggesting right atrial pressure of 3 mmHg.    Heart monitor 11/2021 Patch Wear Time:  14 days and 0 hours (2022-12-16T15:20:22-0500 to 2022-12-30T15:20:26-0500)   Patient had a min HR of 55 bpm, max HR of 203 bpm, and avg HR of 74 bpm. Predominant underlying rhythm was Sinus Rhythm.  23 Supraventricular Tachycardia runs occurred, the run with the fastest interval lasting 8 beats with a max rate of 203 bpm, the longest lasting 20 beats with an avg rate of 159 bpm. Supraventricular Tachycardia was detected within +/- 45 seconds of symptomatic patient event(s). Occasional PACs and rare PVCs.      Physical Exam VS:  BP 126/68   Pulse 65   Ht 5' 4 (1.626 m)   Wt 200 lb 9.6 oz (91 kg)   LMP  (LMP Unknown)   SpO2 97%   BMI 34.43 kg/m        Wt Readings from Last 3 Encounters:  09/06/24 200 lb 9.6 oz (91 kg)  07/23/24 199 lb 12.8 oz (90.6 kg)  05/11/24 189 lb 6.4 oz (85.9 kg)    GEN: Well nourished, well developed in no acute distress NECK: No JVD; No carotid bruits CARDIAC: RRR, no murmurs, rubs, gallops RESPIRATORY:  Clear to auscultation without rales, wheezing or rhonchi  ABDOMEN: Soft, non-tender, non-distended EXTREMITIES:  No edema; No deformity   ASSESSMENT AND PLAN  Paroxysmal Afib The patient reports occasional heart flutters, but these are stable.  EKG shows normal sinus rhythm with occasional PVCs and sinus arrhythmia.  We discussed increasing diltiazem , but patient would like to wait at this time.  We will reevaluate in 4 months.  Continue Eliquis  5 mg twice daily for stroke prophylaxis.  Chest pain CAC Patient reports occasional mild chest pain at night.  She has no exertional symptoms.  Cardiac PET stress earlier this year was normal, low risk with a normal EF.  No further workup at this time.  No aspirin  given Eliquis .   Continue statin and beta-blocker therapy.  HTN Blood pressure today is normal.  Continue Cardizem  120mg  daily, spironolactone  25 mg daily, Benicar  40 mg daily and Coreg  25 mg twice daily.  HLD LDL 60. Continue Pravastatin  40mg  daily.   Chronic diastolic heart failure Moderate to severe pulmonary HTN Patient reports chronic and unchanged shortness of breath.  Patient is euvolemic on exam.  Continue Lasix  20 mg a day.     Dispo: Follow-up in 4 months  Signed, Otillia Cordone VEAR Fishman, PA-C

## 2024-09-07 MED ORDER — TRULICITY 3 MG/0.5ML ~~LOC~~ SOAJ: 3.0000 mg | SUBCUTANEOUS | 5 refills | Status: AC

## 2024-09-07 NOTE — Telephone Encounter (Signed)
 Appt sch'd for tomorrow with you

## 2024-09-07 NOTE — Addendum Note (Signed)
 Addended by: GLENARD MIRE F on: 09/07/2024 10:45 AM   Modules accepted: Orders

## 2024-09-08 ENCOUNTER — Ambulatory Visit: Admitting: Family Medicine

## 2024-09-16 ENCOUNTER — Ambulatory Visit

## 2024-10-11 DIAGNOSIS — E119 Type 2 diabetes mellitus without complications: Secondary | ICD-10-CM | POA: Diagnosis not present

## 2024-10-11 DIAGNOSIS — H43813 Vitreous degeneration, bilateral: Secondary | ICD-10-CM | POA: Diagnosis not present

## 2024-10-11 DIAGNOSIS — H401132 Primary open-angle glaucoma, bilateral, moderate stage: Secondary | ICD-10-CM | POA: Diagnosis not present

## 2024-10-11 DIAGNOSIS — M3501 Sicca syndrome with keratoconjunctivitis: Secondary | ICD-10-CM | POA: Diagnosis not present

## 2024-10-11 LAB — OPHTHALMOLOGY REPORT-SCANNED

## 2024-11-27 ENCOUNTER — Other Ambulatory Visit: Payer: Self-pay | Admitting: Cardiovascular Disease

## 2024-12-10 ENCOUNTER — Ambulatory Visit (INDEPENDENT_AMBULATORY_CARE_PROVIDER_SITE_OTHER)

## 2024-12-10 VITALS — BP 142/70 | Ht 64.0 in | Wt 199.6 lb

## 2024-12-10 DIAGNOSIS — Z0000 Encounter for general adult medical examination without abnormal findings: Secondary | ICD-10-CM

## 2024-12-10 NOTE — Patient Instructions (Addendum)
 [name],  Thank you for taking the time for your Medicare Wellness Visit. I appreciate your continued commitment to your health goals. Please review the care plan we discussed, and feel free to reach out if I can assist you further.  Please note that Annual Wellness Visits do not include a physical exam. Some assessments may be limited, especially if the visit was conducted virtually. If needed, we may recommend an in-person follow-up with your provider.  Ongoing Care Seeing your primary care provider every 3 to 6 months helps us  monitor your health and provide consistent, personalized care. 01/20/25 @ 10:20 AM APPT W/ DR.SOWLES  Referrals If a referral was made during today's visit and you haven't received any updates within two weeks, please contact the referred provider directly to check on the status.  Recommended Screenings:  Health Maintenance  Topic Date Due   Medicare Annual Wellness Visit  12/06/2023   COVID-19 Vaccine (7 - 2025-26 season) 07/05/2024   Hemoglobin A1C  01/20/2025   Complete foot exam   07/23/2025   Eye exam for diabetics  10/11/2025   Pneumococcal Vaccine for age over 46  Completed   Flu Shot  Completed   Osteoporosis screening with Bone Density Scan  Completed   Zoster (Shingles) Vaccine  Completed   Meningitis B Vaccine  Aged Out   DTaP/Tdap/Td vaccine  Discontinued   Breast Cancer Screening  Discontinued     Vision: Annual vision screenings are recommended for early detection of glaucoma, cataracts, and diabetic retinopathy. These exams can also reveal signs of chronic conditions such as diabetes and high blood pressure.  Dental: Annual dental screenings help detect early signs of oral cancer, gum disease, and other conditions linked to overall health, including heart disease and diabetes.  Please see the attached documents for additional preventive care recommendations.   NEXT AWV 12/16/25 @ 3:00 PM IN PERSON

## 2024-12-10 NOTE — Progress Notes (Signed)
 "  Chief Complaint  Patient presents with   Medicare Wellness     Subjective:   Susan Bowen is a 89 y.o. female who presents for a Medicare Annual Wellness Visit.  Visit info / Clinical Intake: Medicare Wellness Visit Type:: Subsequent Annual Wellness Visit Persons participating in visit and providing information:: patient Medicare Wellness Visit Mode:: In-person (required for WTM) Interpreter Needed?: No Pre-visit prep was completed: yes AWV questionnaire completed by patient prior to visit?: no Living arrangements:: with family/others (LIVES W/ SONS) Patient's Overall Health Status Rating: good Typical amount of pain: none Does pain affect daily life?: no Are you currently prescribed opioids?: no  Dietary Habits and Nutritional Risks How many meals a day?: 2 (SNACK AT LUNCH) Eats fruit and vegetables daily?: (!) no Most meals are obtained by: preparing own meals In the last 2 weeks, have you had any of the following?: none Diabetic:: (!) yes Any non-healing wounds?: no How often do you check your BS?: 1 (ONCE PER DAY) Would you like to be referred to a Nutritionist or for Diabetic Management? : no  Functional Status Activities of Daily Living (to include ambulation/medication): Independent Ambulation: Independent Medication Administration: Independent Home Management (perform basic housework or laundry): Independent Manage your own finances?: yes Primary transportation is: family / friends Concerns about vision?: no *vision screening is required for WTM* (GLASSES ALL DAY- DR.PORFILIO- Q6 MONTHS) Concerns about hearing?: (!) yes Uses hearing aids?: (!) yes (BOTH EARS) Hear whispered voice?: (!) no *in-person visit only*  Fall Screening Falls in the past year?: 0 Number of falls in past year: 0 Was there an injury with Fall?: 0 Fall Risk Category Calculator: 0 Patient Fall Risk Level: Low Fall Risk  Fall Risk Patient at Risk for Falls Due to: No Fall Risks Fall  risk Follow up: Falls evaluation completed; Falls prevention discussed  Home and Transportation Safety: All rugs have non-skid backing?: yes All stairs or steps have railings?: N/A, no stairs Grab bars in the bathtub or shower?: (!) no Have non-skid surface in bathtub or shower?: yes Good home lighting?: yes Regular seat belt use?: yes Hospital stays in the last year:: no  Cognitive Assessment Difficulty concentrating, remembering, or making decisions? : no Will 6CIT or Mini Cog be Completed: yes What year is it?: 0 points What month is it?: 0 points Give patient an address phrase to remember (5 components): [address] [place] what time is it?: 0 points Count backwards from 20 to 1: 0 points Say the months of the year in reverse: 0 points Repeat the address phrase from earlier: 0 points 6 CIT Score: 0 points  Advance Directives (For Healthcare) Does Patient Have a Medical Advance Directive?: No Would patient like information on creating a medical advance directive?: No - Patient declined  Reviewed/Updated  Reviewed/Updated: Reviewed All (Medical, Surgical, Family, Medications, Allergies, Care Teams, Patient Goals)    Allergies (verified) Augmentin [amoxicillin-pot clavulanate]   Current Medications (verified) Outpatient Encounter Medications as of 12/10/2024  Medication Sig   acetaminophen  (TYLENOL ) 500 MG tablet Take 500 mg by mouth every 6 (six) hours as needed.   Alcohol Swabs (DROPSAFE ALCOHOL PREP) 70 % PADS USE TWICE DAILY   apixaban  (ELIQUIS ) 5 MG TABS tablet Take 1 tablet (5 mg total) by mouth 2 (two) times daily.   Blood Glucose Calibration (TRUE METRIX LEVEL 1) Low SOLN 1 each by In Vitro route once for 1 dose.   Blood Glucose Monitoring Suppl (ACCU-CHEK GUIDE) w/Device  KIT PLEASE SEE ATTACHED FOR DETAILED DIRECTIONS   Blood Glucose Monitoring Suppl DEVI 1 each by Does not apply route in the morning, at noon, and at bedtime. May substitute to any  manufacturer covered by patient's insurance.   carvedilol  (COREG ) 25 MG tablet Take 1 tablet (25 mg total) by mouth 2 (two) times daily.   diltiazem  (CARDIZEM  CD) 120 MG 24 hr capsule TAKE 1 CAPSULE BY MOUTH EVERY DAY   dorzolamide-timolol  (COSOPT) 22.3-6.8 MG/ML ophthalmic solution Place 1 drop into both eyes 2 (two) times daily.   Dulaglutide  (TRULICITY ) 3 MG/0.5ML SOAJ Inject 3 mg into the skin once a week.   empagliflozin  (JARDIANCE ) 25 MG TABS tablet Take 1 tablet (25 mg total) by mouth daily.   fluticasone  (FLONASE ) 50 MCG/ACT nasal spray SPRAY 2 SPRAYS INTO EACH NOSTRIL EVERY DAY   furosemide  (LASIX ) 20 MG tablet Take 1 tablet (20 mg total) by mouth daily.   glucose blood test strip CHECK FASTING GLUCOSE ONCE EACH MORNING, AND CHECK GLUCOSE AS NEEDED.   ketoconazole  (NIZORAL ) 2 % cream APPLY 1 APPLICATION TOPICALLY DAILY   levothyroxine  (SYNTHROID ) 50 MCG tablet Take 1 tablet (50 mcg total) by mouth daily before breakfast. And take two tablets twice a week   loratadine  (CLARITIN ) 10 MG tablet Take 1 tablet (10 mg total) by mouth daily.   metFORMIN  (GLUCOPHAGE -XR) 500 MG 24 hr tablet Take 1 tablet (500 mg total) by mouth daily with breakfast.   Misc Natural Products (NEURIVA PO) Take 1 tablet by mouth daily.   olmesartan  (BENICAR ) 40 MG tablet Take 1 tablet (40 mg total) by mouth daily.   pravastatin  (PRAVACHOL ) 40 MG tablet Take 1 tablet (40 mg total) by mouth daily.   spironolactone  (ALDACTONE ) 25 MG tablet Take 1 tablet (25 mg total) by mouth daily.   tacrolimus  (PROTOPIC ) 0.1 % ointment APPLY TWICE A DAY TO RASH UNDER BREAST AND IN GROIN FOR 2 WEEKS, THEN DECREASE TO EVERY DAY UNTIL CLEAR, THEN PRN FLARES   Travoprost, BAK Free, (TRAVATAN) 0.004 % SOLN ophthalmic solution Place 1 drop into both eyes at bedtime.   TRUE METRIX BLOOD GLUCOSE TEST test strip TEST BLOOD SUGAR IN THE MORNING, AT NOON, AND AT BEDTIME   TRUEplus Lancets 33G MISC USE AS DIRECTED   pregabalin  (LYRICA ) 50 MG  capsule Take 1 capsule (50 mg total) by mouth every evening. (Patient not taking: Reported on 12/10/2024)   Facility-Administered Encounter Medications as of 12/10/2024  Medication   cyanocobalamin  (VITAMIN B12) injection 1,000 mcg   cyanocobalamin  (VITAMIN B12) injection 1,000 mcg    History: Past Medical History:  Diagnosis Date   Allergy    Back spasm    Bunion    Carpal tunnel syndrome    Cataracta    Diabetes mellitus without complication (HCC)    Generalized osteoarthritis    GERD (gastroesophageal reflux disease)    Gout    Hemorrhoids without complication    Hyperlipidemia    Hypertension    Hypothyroidism    Impingement syndrome of right shoulder    Insomnia    Lipoma    Lung nodule    Metastatic carcinoid tumor (HCC) 03/2016   Neuritis or radiculitis due to rupture of lumbar intervertebral disc    Obesity    Osteoporosis    PAF (paroxysmal atrial fibrillation) (HCC)    a. 03/2018 AF w/ RVR-->converted spont.  CHA2DS2VASc = 7-->Eliquis .   Proteinuria    Rotator cuff tear    Systolic murmur    a.  03/2018 Echo: EF 60-65%, no rwma, Gr1 DD, mild MR, mildly dil LA. Nl RV fxn. PASP .   TIA (transient ischemic attack)    TIA (transient ischemic attack) 1998   small TIA.  no residual effects   Tinnitus of both ears    Unspecified glaucoma(365.9)    Vitamin D  deficiency    Wedge compression fracture of t11-T12 vertebra, sequela    Past Surgical History:  Procedure Laterality Date   ABDOMINAL HYSTERECTOMY  1986   APPENDECTOMY     BREAST SURGERY Bilateral 1983   biopsy of each side, negative   CARPAL TUNNEL RELEASE     CATARACT EXTRACTION W/PHACO Left 09/03/2017   Procedure: CATARACT EXTRACTION PHACO AND INTRAOCULAR LENS PLACEMENT (IOC)-LEFT DIABETIC;  Surgeon: Jaye Fallow, MD;  Location: [facility] ORS;  Service: Ophthalmology;  Laterality: Left;  US  01:10.2 AP% 16.7 CDE 11.71 Fluid Pack Lot # [id number]   CATARACT EXTRACTION W/PHACO Right 09/30/2017    Procedure: CATARACT EXTRACTION PHACO AND INTRAOCULAR LENS PLACEMENT (IOC);  Surgeon: Jaye Fallow, MD;  Location: [facility] ORS;  Service: Ophthalmology;  Laterality: Right;  US  01:35.1 AP% 13.1 CDE 12.44 Fluid pack Lot # [id number]   COLON SURGERY     EYE SURGERY Left 09/03/2017   Cataract extraction   FEMUR FRACTURE SURGERY Left    FRACTURE SURGERY  2001   left femur   HEMORROIDECTOMY     MULTIPLE TOOTH EXTRACTIONS     polyp removed from vocal cord     Family History  Problem Relation Age of Onset   Diabetes Mother    Hypertension Mother    Dementia Mother    Hypertension Son    Social History   Occupational History   Not on file  Tobacco Use   Smoking status: Never   Smokeless tobacco: Never   Tobacco comments:    Smoking cessation materials not required  Vaping Use   Vaping status: Never Used  Substance and Sexual Activity   Alcohol use: No    Alcohol/week: 0.0 standard drinks of alcohol   Drug use: No   Sexual activity: Not Currently   Tobacco Counseling Counseling given: Not Answered Tobacco comments: Smoking cessation materials not required  SDOH Screenings   Food Insecurity: No Food Insecurity (12/10/2024)  Housing: Low Risk (12/10/2024)  Transportation Needs: No Transportation Needs (12/10/2024)  Utilities: Not At Risk (12/10/2024)  Alcohol Screen: Low Risk (12/05/2022)  Depression (PHQ2-9): Low Risk (12/10/2024)  Financial Resource Strain: Low Risk (02/05/2023)  Recent Concern: Financial Resource Strain - High Risk (12/05/2022)  Physical Activity: Insufficiently Active (12/10/2024)  Social Connections: Moderately Integrated (12/10/2024)  Stress: No Stress Concern Present (12/10/2024)  Tobacco Use: Low Risk (12/10/2024)  Health Literacy: Adequate Health Literacy (12/10/2024)   See flowsheets for full screening details  Depression Screen PHQ 2 & 9 Depression Scale- Over the past 2 weeks, how often have you been bothered by any of the following problems? Little interest or  pleasure in doing things: 0 Feeling down, depressed, or hopeless (PHQ Adolescent also includes...irritable): 0 PHQ-2 Total Score: 0 Trouble falling or staying asleep, or sleeping too much: 0 Feeling tired or having little energy: 0 Poor appetite or overeating (PHQ Adolescent also includes...weight loss): 0 Feeling bad about yourself - or that you are a failure or have let yourself or your family down: 0 Trouble concentrating on things, such as reading the newspaper or watching television (PHQ Adolescent also includes...like school work): 0 Moving or speaking so slowly that other people could have noticed.  Or the opposite - being so fidgety or restless that you have been moving around a lot more than usual: 0 Thoughts that you would be better off dead, or of hurting yourself in some way: 0 PHQ-9 Total Score: 0 If you checked off any problems, how difficult have these problems made it for you to do your work, take care of things at home, or get along with other people?: Not difficult at all  Depression Treatment Depression Interventions/Treatment : EYV7-0 Score <4 Follow-up Not Indicated     Goals Addressed             This Visit's Progress    DIET - EAT MORE FRUITS AND VEGETABLES               Objective:    Today's Vitals   12/10/24 1502  BP: (!) 142/70  Weight: 199 lb 9.6 oz (90.5 kg)  Height: 5' 4 (1.626 m)   Body mass index is 34.26 kg/m.  Hearing/Vision screen No results found. Immunizations and Health Maintenance Health Maintenance  Topic Date Due   Medicare Annual Wellness (AWV)  12/06/2023   COVID-19 Vaccine (7 - 2025-26 season) 07/05/2024   HEMOGLOBIN A1C  01/20/2025   FOOT EXAM  07/23/2025   OPHTHALMOLOGY EXAM  10/11/2025   Pneumococcal Vaccine: 50+ Years  Completed   Influenza Vaccine  Completed   Bone Density Scan  Completed   Zoster Vaccines- Shingrix   Completed   Meningococcal B Vaccine  Aged Out   DTaP/Tdap/Td  Discontinued   Mammogram   Discontinued        Assessment/Plan:  This is a routine wellness examination for Susan Bowen.  Patient Care Team: Sowles, Krichna, MD as PCP - General (Family Medicine) Darron Deatrice LABOR, MD as PCP - Cardiology (Cardiology) Melanee Annah BROCKS, MD as Consulting Physician (Oncology) Rennie Cindy SAUNDERS, MD as Consulting Physician (Internal Medicine) Jackquline Sawyer, MD (Dermatology) Melanee Annah BROCKS, MD as Consulting Physician (Hematology and Oncology) Jaye Fallow, MD as Referring Physician (Ophthalmology)  I have personally reviewed and noted the following in the patients chart:   Medical and social history Use of alcohol, tobacco or illicit drugs  Current medications and supplements including opioid prescriptions. Functional ability and status Nutritional status Physical activity Advanced directives List of other physicians Hospitalizations, surgeries, and ER visits in previous 12 months Vitals Screenings to include cognitive, depression, and falls Referrals and appointments  No orders of the defined types were placed in this encounter.  In addition, I have reviewed and discussed with patient certain preventive protocols, quality metrics, and best practice recommendations. A written personalized care plan for preventive services as well as general preventive health recommendations were provided to patient.   Jhonnie GORMAN Das, LPN   05/06/7972   Return in about 1 year (around 12/10/2025).  After Visit Summary: (In Person-Printed) AVS printed and given to the patient  Nurse Notes: UTD ON SHOTS EXCEPT TDAP; AGED OUT OF MAMMOGRAM, COLONOSCOPY & BDS  No voiced or noted concerns at this time  "

## 2025-01-04 ENCOUNTER — Ambulatory Visit: Admitting: Medical

## 2025-01-20 ENCOUNTER — Ambulatory Visit: Admitting: Family Medicine

## 2025-05-09 ENCOUNTER — Other Ambulatory Visit

## 2025-05-09 ENCOUNTER — Ambulatory Visit: Admitting: Oncology

## 2025-05-11 ENCOUNTER — Other Ambulatory Visit

## 2025-05-25 ENCOUNTER — Ambulatory Visit: Admitting: Oncology

## 2025-05-25 ENCOUNTER — Other Ambulatory Visit

## 2025-12-16 ENCOUNTER — Ambulatory Visit
